# Patient Record
Sex: Female | Born: 1940
Health system: Southern US, Community
[De-identification: ages and names within clinical notes are randomized; demographics above are authoritative.]

## PROBLEM LIST (undated history)

## (undated) DIAGNOSIS — N189 Chronic kidney disease, unspecified: Secondary | ICD-10-CM

## (undated) DIAGNOSIS — I480 Paroxysmal atrial fibrillation: Secondary | ICD-10-CM

## (undated) DIAGNOSIS — I214 Non-ST elevation (NSTEMI) myocardial infarction: Secondary | ICD-10-CM

## (undated) DIAGNOSIS — T7840XA Allergy, unspecified, initial encounter: Secondary | ICD-10-CM

## (undated) DIAGNOSIS — E785 Hyperlipidemia, unspecified: Secondary | ICD-10-CM

## (undated) DIAGNOSIS — I219 Acute myocardial infarction, unspecified: Secondary | ICD-10-CM

## (undated) DIAGNOSIS — I4891 Unspecified atrial fibrillation: Secondary | ICD-10-CM

## (undated) DIAGNOSIS — M1990 Unspecified osteoarthritis, unspecified site: Secondary | ICD-10-CM

## (undated) DIAGNOSIS — K219 Gastro-esophageal reflux disease without esophagitis: Secondary | ICD-10-CM

## (undated) DIAGNOSIS — I10 Essential (primary) hypertension: Secondary | ICD-10-CM

## (undated) HISTORY — PX: YAG LASER APPLICATION: SHX6189

## (undated) HISTORY — PX: CATARACT EXTRACTION, BILATERAL: SHX1313

## (undated) HISTORY — DX: Chronic kidney disease, unspecified: N18.9

## (undated) HISTORY — PX: APPENDECTOMY: SHX54

## (undated) HISTORY — DX: Allergy, unspecified, initial encounter: T78.40XA

## (undated) HISTORY — DX: Unspecified osteoarthritis, unspecified site: M19.90

## (undated) HISTORY — DX: Unspecified atrial fibrillation: I48.91

## (undated) HISTORY — PX: EYE SURGERY: SHX253

## (undated) HISTORY — DX: Acute myocardial infarction, unspecified: I21.9

## (undated) HISTORY — DX: Hyperlipidemia, unspecified: E78.5

## (undated) HISTORY — PX: STRABISMUS SURGERY: SHX218

## (undated) HISTORY — DX: Non-ST elevation (NSTEMI) myocardial infarction: I21.4

## (undated) HISTORY — DX: Paroxysmal atrial fibrillation: I48.0

## (undated) HISTORY — DX: Gastro-esophageal reflux disease without esophagitis: K21.9

## (undated) HISTORY — PX: TONSILLECTOMY: SUR1361

## (undated) HISTORY — PX: CATARACT EXTRACTION: SUR2

---

## 1998-05-22 ENCOUNTER — Other Ambulatory Visit: Admission: RE | Admit: 1998-05-22 | Discharge: 1998-05-22 | Payer: Self-pay | Admitting: Obstetrics & Gynecology

## 1998-09-06 ENCOUNTER — Ambulatory Visit (HOSPITAL_COMMUNITY): Admission: RE | Admit: 1998-09-06 | Discharge: 1998-09-06 | Payer: Self-pay | Admitting: Obstetrics & Gynecology

## 1998-09-06 ENCOUNTER — Encounter: Payer: Self-pay | Admitting: Obstetrics & Gynecology

## 1999-06-24 ENCOUNTER — Other Ambulatory Visit: Admission: RE | Admit: 1999-06-24 | Discharge: 1999-06-24 | Payer: Self-pay | Admitting: Obstetrics & Gynecology

## 1999-10-08 ENCOUNTER — Ambulatory Visit (HOSPITAL_COMMUNITY): Admission: RE | Admit: 1999-10-08 | Discharge: 1999-10-08 | Payer: Self-pay | Admitting: Obstetrics & Gynecology

## 1999-10-08 ENCOUNTER — Encounter: Payer: Self-pay | Admitting: Obstetrics & Gynecology

## 2000-10-09 ENCOUNTER — Ambulatory Visit (HOSPITAL_COMMUNITY): Admission: RE | Admit: 2000-10-09 | Discharge: 2000-10-09 | Payer: Self-pay | Admitting: Obstetrics & Gynecology

## 2000-10-09 ENCOUNTER — Encounter: Payer: Self-pay | Admitting: Obstetrics & Gynecology

## 2000-11-05 ENCOUNTER — Other Ambulatory Visit: Admission: RE | Admit: 2000-11-05 | Discharge: 2000-11-05 | Payer: Self-pay | Admitting: Obstetrics & Gynecology

## 2000-11-27 ENCOUNTER — Other Ambulatory Visit: Admission: RE | Admit: 2000-11-27 | Discharge: 2000-11-27 | Payer: Self-pay | Admitting: Orthopedic Surgery

## 2001-01-01 ENCOUNTER — Ambulatory Visit (HOSPITAL_COMMUNITY): Admission: RE | Admit: 2001-01-01 | Discharge: 2001-01-01 | Payer: Self-pay | Admitting: *Deleted

## 2001-07-15 ENCOUNTER — Other Ambulatory Visit: Admission: RE | Admit: 2001-07-15 | Discharge: 2001-07-15 | Payer: Self-pay | Admitting: Internal Medicine

## 2001-11-01 ENCOUNTER — Ambulatory Visit (HOSPITAL_COMMUNITY): Admission: RE | Admit: 2001-11-01 | Discharge: 2001-11-01 | Payer: Self-pay | Admitting: Internal Medicine

## 2001-11-01 ENCOUNTER — Encounter: Payer: Self-pay | Admitting: Internal Medicine

## 2002-11-02 ENCOUNTER — Ambulatory Visit (HOSPITAL_COMMUNITY): Admission: RE | Admit: 2002-11-02 | Discharge: 2002-11-02 | Payer: Self-pay | Admitting: Internal Medicine

## 2002-11-02 ENCOUNTER — Encounter: Payer: Self-pay | Admitting: Internal Medicine

## 2003-11-07 ENCOUNTER — Ambulatory Visit (HOSPITAL_COMMUNITY): Admission: RE | Admit: 2003-11-07 | Discharge: 2003-11-07 | Payer: Self-pay | Admitting: Internal Medicine

## 2004-02-27 ENCOUNTER — Other Ambulatory Visit: Admission: RE | Admit: 2004-02-27 | Discharge: 2004-02-27 | Payer: Self-pay | Admitting: Internal Medicine

## 2004-11-07 ENCOUNTER — Ambulatory Visit (HOSPITAL_COMMUNITY): Admission: RE | Admit: 2004-11-07 | Discharge: 2004-11-07 | Payer: Self-pay | Admitting: Internal Medicine

## 2005-11-17 ENCOUNTER — Ambulatory Visit (HOSPITAL_COMMUNITY): Admission: RE | Admit: 2005-11-17 | Discharge: 2005-11-17 | Payer: Self-pay | Admitting: Internal Medicine

## 2005-12-04 ENCOUNTER — Encounter: Admission: RE | Admit: 2005-12-04 | Discharge: 2005-12-04 | Payer: Self-pay | Admitting: Internal Medicine

## 2006-12-01 ENCOUNTER — Encounter: Admission: RE | Admit: 2006-12-01 | Discharge: 2006-12-01 | Payer: Self-pay | Admitting: Internal Medicine

## 2007-03-16 ENCOUNTER — Other Ambulatory Visit: Admission: RE | Admit: 2007-03-16 | Discharge: 2007-03-16 | Payer: Self-pay | Admitting: Internal Medicine

## 2007-07-06 IMAGING — MG MM DIGITAL SCREENING BILAT
4 series · 4 of 4 positions shown · non-contrast
Comparison: none

Bilateral CC and MLO view(s) were taken.

SCREENING MAMMOGRAM:
Comparison is made to previous study dated 11-07-03.  There are scattered fibroglandular densities. 
A possible mass is noted in the left breast.  Spot compression views and possibly sonography are 
recommended for further evaluation.  There are no masses or malignant type calcifications in the 
right breast.

[R CC]
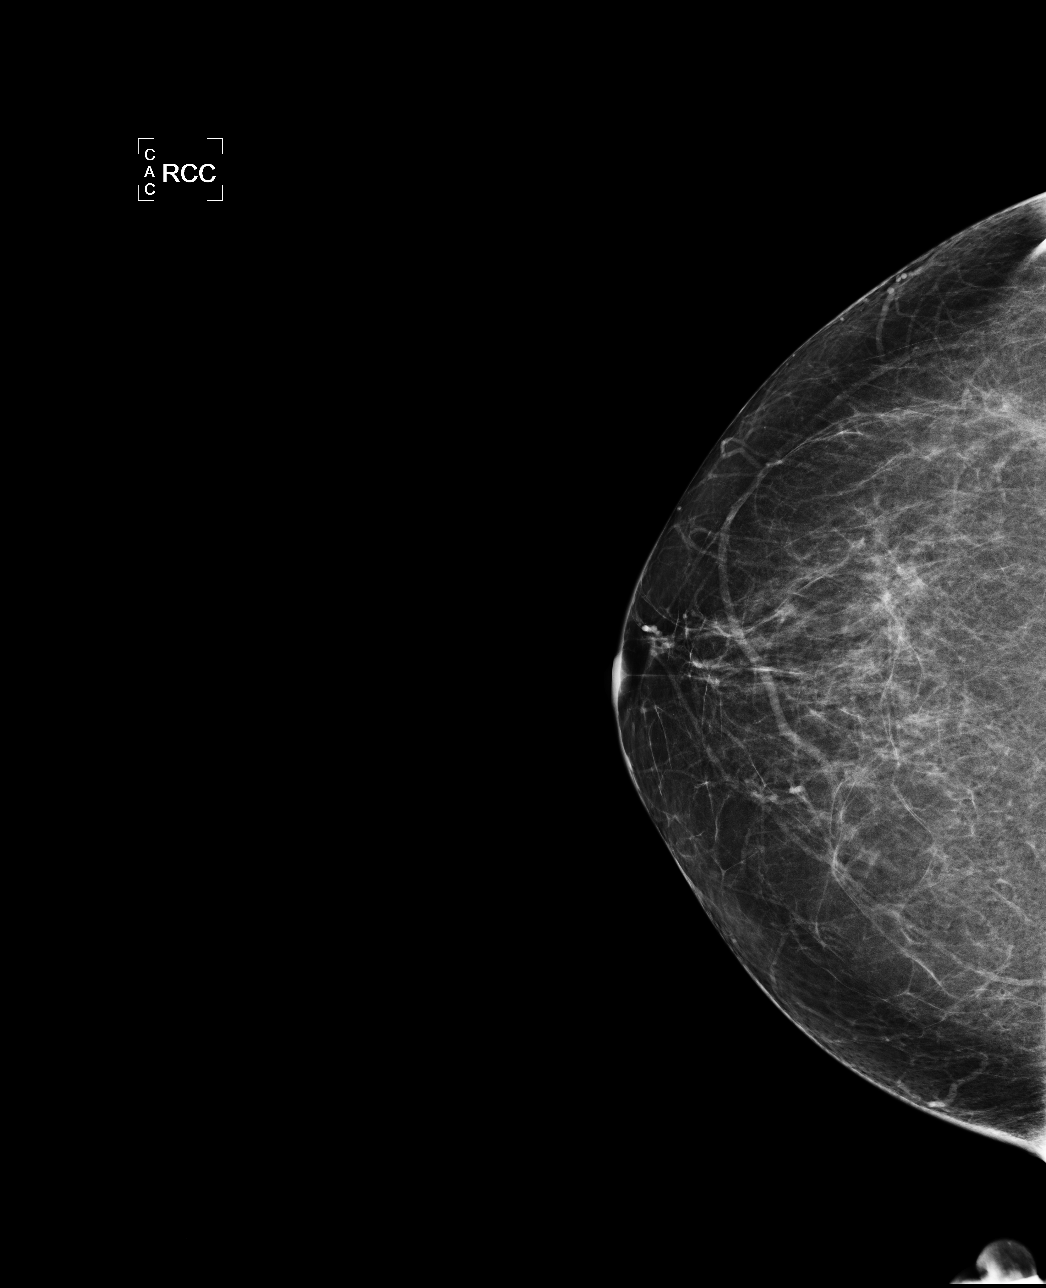

[R MLO]
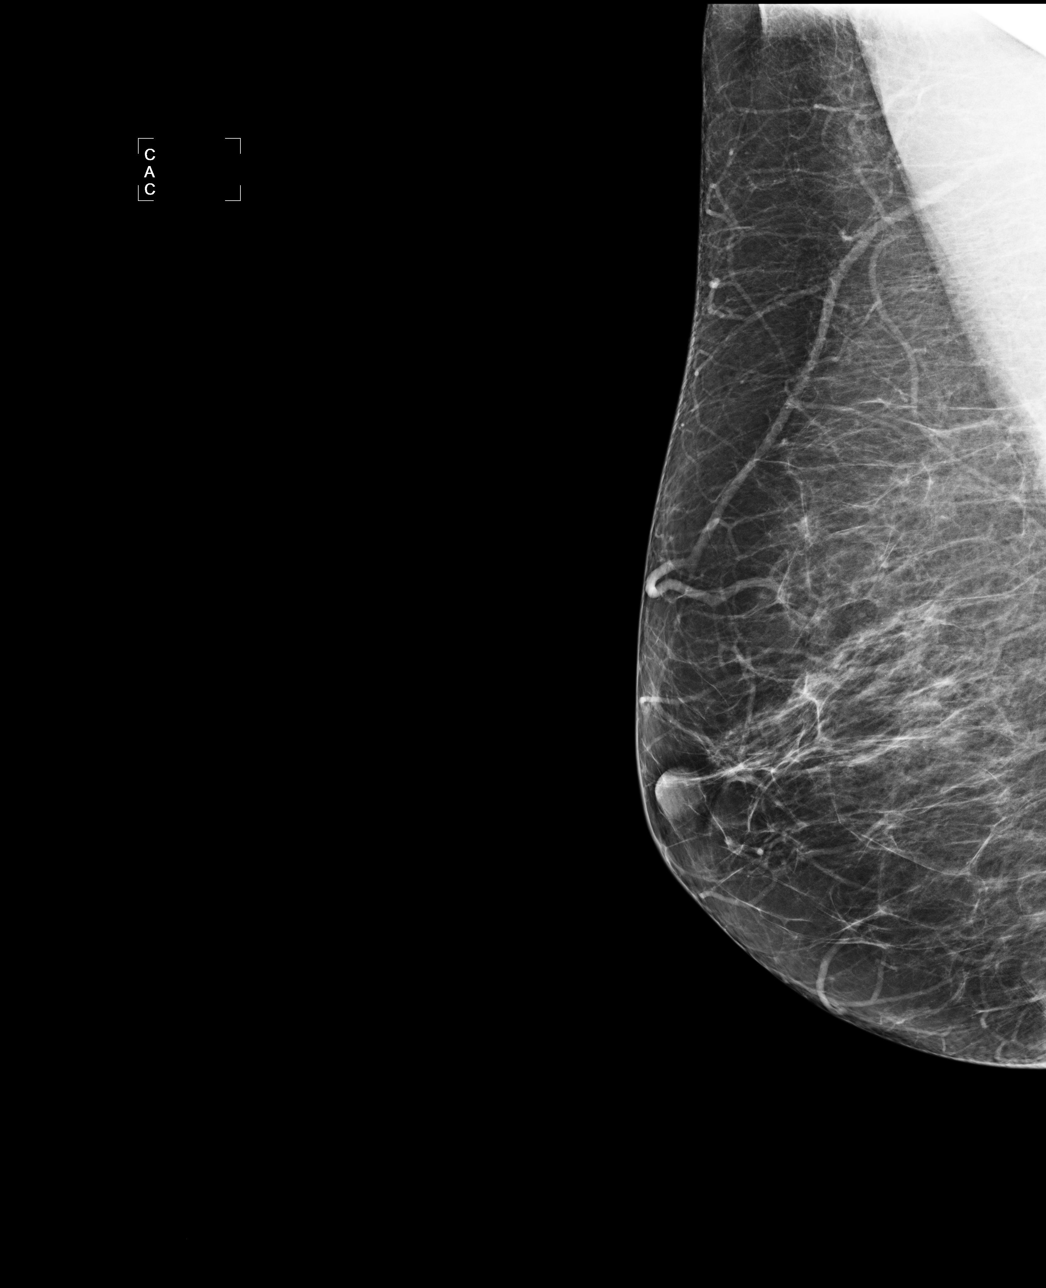

[L CC]
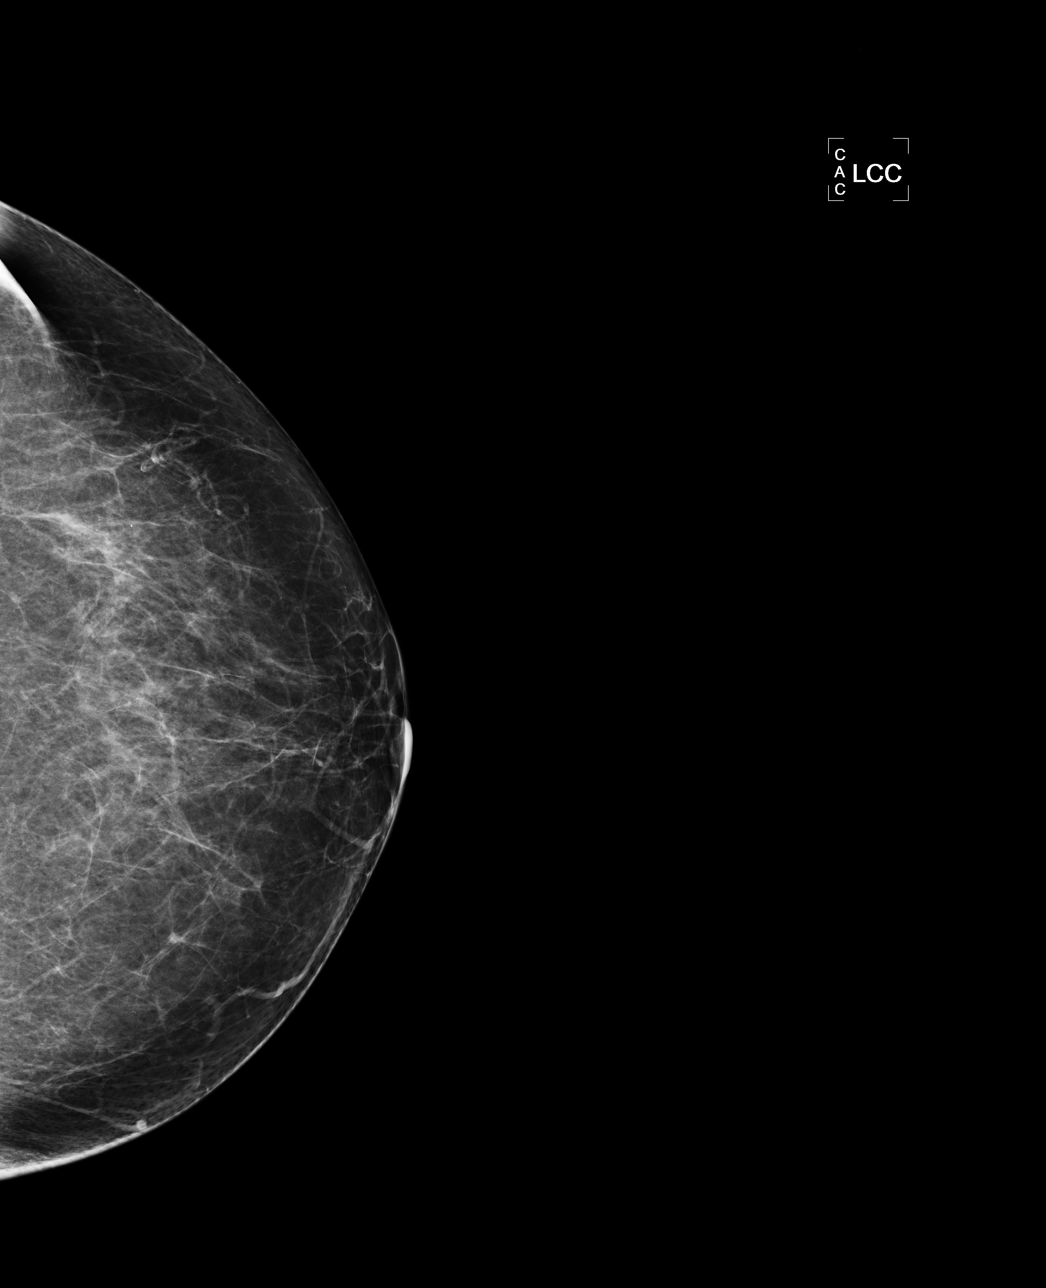

[L MLO]
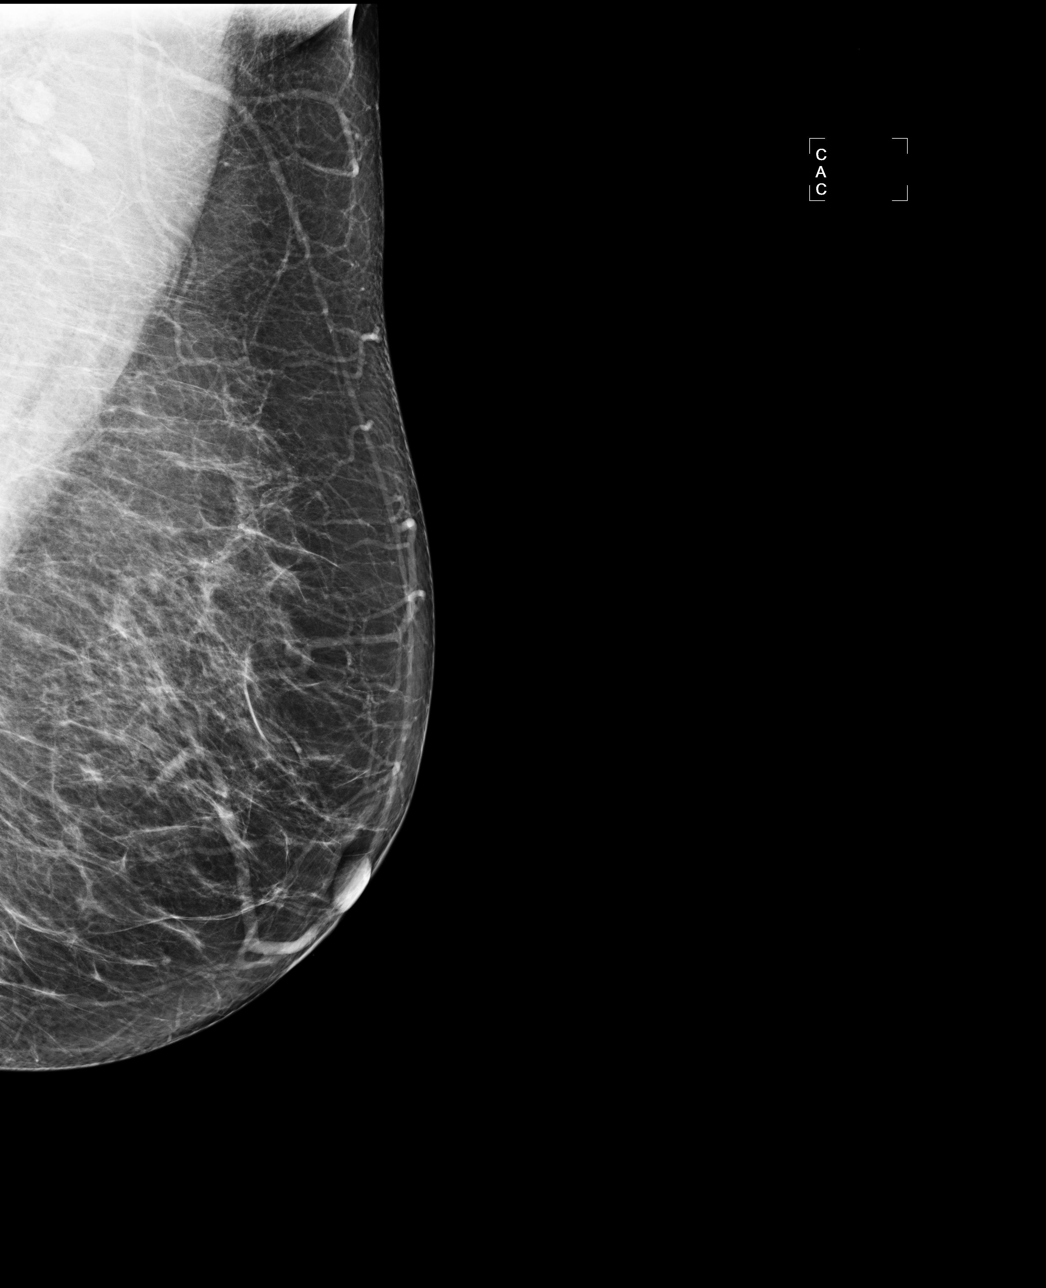

[4 of 4 positions shown; findings below may reference images not displayed]

IMPRESSION: Possible mass, left breast.  Additional evaluation is indicated.  The patient will be contacted for
additional studies and a supplemental report will follow.  No mammographic evidence of malignancy,
right breast.

ASSESSMENT: Need additional imaging evaluation and/or prior mammograms for comparison - BI-RADS 0

Further imaging of the left breast.

## 2007-12-14 ENCOUNTER — Encounter: Admission: RE | Admit: 2007-12-14 | Discharge: 2007-12-14 | Payer: Self-pay | Admitting: Internal Medicine

## 2008-07-03 ENCOUNTER — Ambulatory Visit (HOSPITAL_COMMUNITY): Admission: RE | Admit: 2008-07-03 | Discharge: 2008-07-03 | Payer: Self-pay | Admitting: *Deleted

## 2008-12-14 ENCOUNTER — Encounter: Admission: RE | Admit: 2008-12-14 | Discharge: 2008-12-14 | Payer: Self-pay | Admitting: Internal Medicine

## 2009-06-12 ENCOUNTER — Other Ambulatory Visit: Admission: RE | Admit: 2009-06-12 | Discharge: 2009-06-12 | Payer: Self-pay | Admitting: Internal Medicine

## 2009-07-18 ENCOUNTER — Encounter: Admission: RE | Admit: 2009-07-18 | Discharge: 2009-09-12 | Payer: Self-pay | Admitting: Orthopedic Surgery

## 2009-12-27 ENCOUNTER — Encounter: Admission: RE | Admit: 2009-12-27 | Discharge: 2009-12-27 | Payer: Self-pay | Admitting: Internal Medicine

## 2010-10-06 ENCOUNTER — Encounter: Payer: Self-pay | Admitting: Internal Medicine

## 2010-11-19 ENCOUNTER — Other Ambulatory Visit: Payer: Self-pay | Admitting: Internal Medicine

## 2010-11-19 DIAGNOSIS — Z1231 Encounter for screening mammogram for malignant neoplasm of breast: Secondary | ICD-10-CM

## 2010-12-30 ENCOUNTER — Ambulatory Visit
Admission: RE | Admit: 2010-12-30 | Discharge: 2010-12-30 | Disposition: A | Discharge status: Home / Self Care | Payer: Medicare Other | Source: Ambulatory Visit | Attending: Internal Medicine | Admitting: Internal Medicine

## 2010-12-30 DIAGNOSIS — Z1231 Encounter for screening mammogram for malignant neoplasm of breast: Secondary | ICD-10-CM

## 2011-01-28 NOTE — Op Note (Signed)
NAME:  Kelli [name], Kelli [name]                     ACCOUNT NO.:  `   MEDICAL RECORD NO.:  [id number]          PATIENT TYPE:  AMB   LOCATION:  ENDO                         FACILITY:  [facility]   PHYSICIAN:  Georgiana Spinner, M.D.    DATE OF BIRTH:  04/12/1941   DATE OF PROCEDURE:  07/03/2008  DATE OF DISCHARGE:                               OPERATIVE REPORT   PROCEDURE:  Colonoscopy.   INDICATIONS:  Colon cancer screening.   ANESTHESIA:  Fentanyl 100 mcg, Versed 10 mg.   PROCEDURE:  With the patient mildly sedated in the left lateral  decubitus position, the Pentax videoscopic pediatric colonoscope was  inserted in the rectum and passed under direct vision to the cecum,  identified by base of cecum and ileocecal valve, both of which were  photographed.  From this point the colonoscope was slowly withdrawn  taking circumferential views of colonic mucosa stopping only in the  rectum which appeared normal on direct and showed hemorrhoids on  retroflexed view.  The endoscope was straightened and withdrawn.  The  patient's vital signs, pulse oximeter remained stable.  The patient  tolerated procedure well without apparent complications.   FINDINGS:  Internal hemorrhoids, otherwise an unremarkable examination.   PLAN:  Repeat examination in 5-10 years.           ______________________________  Georgiana Spinner, M.D.     GMO/MEDQ  D:  07/03/2008  T:  07/03/2008  Job:  [id number]

## 2011-01-31 NOTE — Procedures (Signed)
[facility]  Patient:    Kelli [name], Kelli [name]                          MRN: [id number] Adm. Date:  [id number] Attending:  Sabino Gasser                           Procedure Report  PROCEDURE:  Colonoscopy.  INDICATIONS:  Rectal bleeding.  ANESTHESIA:  Demerol 50 mg, Versed 5 mg.  DESCRIPTION OF PROCEDURE:  With patient mildly sedated in the left lateral decubitus position, the Olympus videoscopic colonoscope was inserted into the rectum, passed under direct vision to the cecum, identified by ileocecal valve and appendiceal orifice, both of which were photographed.  We entered into the terminal ileum, which appeared normal and was photographed.  From this point, the colonoscope was then slowly withdrawn, taking circumferential views of the entire colonic mucosa, stopping in the sigmoid colon, where there was a rare diverticulum, photographed.  The endoscope was pulled back to the rectum, which appeared normal on direct view and showed internal hemorrhoids on retroflex view.  The endoscope was straightened and withdrawn.  The patients vital signs and pulse oximetry remained stable.  The patient tolerated the procedure well without apparent complications.  FINDINGS:  Internal hemorrhoids, rare diverticulum in the sigmoid colon, otherwise unremarkable colonoscopic examination, including the terminal ileum.  PLAN:  Follow up with me in five years or as needed. DD:  01/01/01 TD:  01/02/01 Job: [id number]

## 2011-11-20 ENCOUNTER — Other Ambulatory Visit: Payer: Self-pay | Admitting: Internal Medicine

## 2011-11-20 DIAGNOSIS — Z1231 Encounter for screening mammogram for malignant neoplasm of breast: Secondary | ICD-10-CM

## 2012-01-01 ENCOUNTER — Ambulatory Visit
Admission: RE | Admit: 2012-01-01 | Discharge: 2012-01-01 | Disposition: A | Discharge status: Home / Self Care | Payer: Medicare Other | Source: Ambulatory Visit | Attending: Internal Medicine | Admitting: Internal Medicine

## 2012-01-01 DIAGNOSIS — Z1231 Encounter for screening mammogram for malignant neoplasm of breast: Secondary | ICD-10-CM

## 2012-08-20 ENCOUNTER — Other Ambulatory Visit: Payer: Self-pay

## 2012-11-29 ENCOUNTER — Other Ambulatory Visit: Payer: Self-pay

## 2012-11-29 DIAGNOSIS — Z1231 Encounter for screening mammogram for malignant neoplasm of breast: Secondary | ICD-10-CM

## 2013-01-05 ENCOUNTER — Ambulatory Visit: Payer: Medicare Other

## 2013-01-07 ENCOUNTER — Ambulatory Visit
Admission: RE | Admit: 2013-01-07 | Discharge: 2013-01-07 | Disposition: A | Discharge status: Home / Self Care | Payer: Medicare Other | Source: Ambulatory Visit

## 2013-01-07 DIAGNOSIS — Z1231 Encounter for screening mammogram for malignant neoplasm of breast: Secondary | ICD-10-CM

## 2013-02-13 DIAGNOSIS — I4891 Unspecified atrial fibrillation: Secondary | ICD-10-CM

## 2013-02-13 DIAGNOSIS — I214 Non-ST elevation (NSTEMI) myocardial infarction: Secondary | ICD-10-CM

## 2013-02-13 HISTORY — DX: Unspecified atrial fibrillation: I48.91

## 2013-02-13 HISTORY — DX: Non-ST elevation (NSTEMI) myocardial infarction: I21.4

## 2013-03-09 ENCOUNTER — Observation Stay (HOSPITAL_COMMUNITY)
Admission: EM | Admit: 2013-03-09 | Discharge: 2013-03-11 | Disposition: A | Discharge status: Home / Self Care | Payer: Medicare Other | Attending: Cardiology | Admitting: Cardiology

## 2013-03-09 ENCOUNTER — Emergency Department (HOSPITAL_COMMUNITY): Payer: Medicare Other

## 2013-03-09 ENCOUNTER — Encounter (HOSPITAL_COMMUNITY): Payer: Self-pay | Admitting: Emergency Medicine

## 2013-03-09 DIAGNOSIS — R0789 Other chest pain: Secondary | ICD-10-CM | POA: Insufficient documentation

## 2013-03-09 DIAGNOSIS — I214 Non-ST elevation (NSTEMI) myocardial infarction: Principal | ICD-10-CM | POA: Diagnosis present

## 2013-03-09 DIAGNOSIS — R0602 Shortness of breath: Secondary | ICD-10-CM | POA: Insufficient documentation

## 2013-03-09 DIAGNOSIS — R748 Abnormal levels of other serum enzymes: Secondary | ICD-10-CM | POA: Insufficient documentation

## 2013-03-09 DIAGNOSIS — Z7901 Long term (current) use of anticoagulants: Secondary | ICD-10-CM | POA: Insufficient documentation

## 2013-03-09 DIAGNOSIS — R7989 Other specified abnormal findings of blood chemistry: Secondary | ICD-10-CM | POA: Diagnosis present

## 2013-03-09 DIAGNOSIS — Z79899 Other long term (current) drug therapy: Secondary | ICD-10-CM | POA: Insufficient documentation

## 2013-03-09 DIAGNOSIS — I10 Essential (primary) hypertension: Secondary | ICD-10-CM | POA: Insufficient documentation

## 2013-03-09 DIAGNOSIS — I4891 Unspecified atrial fibrillation: Secondary | ICD-10-CM | POA: Insufficient documentation

## 2013-03-09 DIAGNOSIS — I480 Paroxysmal atrial fibrillation: Secondary | ICD-10-CM | POA: Diagnosis present

## 2013-03-09 HISTORY — DX: Essential (primary) hypertension: I10

## 2013-03-09 LAB — CBC WITH DIFFERENTIAL/PLATELET
Basophils Relative: 0 % (ref 0–1)
Eosinophils Absolute: 0.1 10*3/uL (ref 0.0–0.7)
Hemoglobin: 13.3 g/dL (ref 12.0–15.0)
MCHC: 34.6 g/dL (ref 30.0–36.0)
MCV: 87.3 fL (ref 78.0–100.0)
Monocytes Absolute: 0.9 10*3/uL (ref 0.1–1.0)
Platelets: 251 10*3/uL (ref 150–400)
RBC: 4.4 MIL/uL (ref 3.87–5.11)
RDW: 13.7 % (ref 11.5–15.5)
WBC: 10.4 10*3/uL (ref 4.0–10.5)

## 2013-03-09 LAB — BASIC METABOLIC PANEL
BUN: 16 mg/dL (ref 6–23)
CO2: 28 mEq/L (ref 19–32)
Calcium: 8.8 mg/dL (ref 8.4–10.5)
GFR calc Af Amer: 67 mL/min — ABNORMAL LOW (ref >90)
GFR calc non Af Amer: 58 mL/min — ABNORMAL LOW (ref >90)

## 2013-03-09 LAB — POCT I-STAT TROPONIN I

## 2013-03-09 LAB — PROTIME-INR
INR: 0.98 (ref 0.00–1.49)
Prothrombin Time: 12.8 seconds (ref 11.6–15.2)

## 2013-03-09 MED ORDER — NITROGLYCERIN 0.4 MG SL SUBL: 0.4000 mg | SUBLINGUAL_TABLET | SUBLINGUAL | Status: DC | PRN

## 2013-03-09 MED ORDER — OMEGA-3-ACID ETHYL ESTERS 1 G PO CAPS
1.0000 g | ORAL_CAPSULE | Freq: Every day | ORAL | Status: DC
  Administered 2013-03-10 – 2013-03-11 (×2): 1 g via ORAL
  Filled 2013-03-09 (×2): qty 1

## 2013-03-09 MED ORDER — CALCIUM + D3 600-200 MG-UNIT PO TABS: 1.0000 | ORAL_TABLET | Freq: Every day | ORAL | Status: DC

## 2013-03-09 MED ORDER — VITAMIN D 400 UNITS PO CAPS: 400.0000 [IU] | ORAL_CAPSULE | Freq: Every day | ORAL | Status: DC

## 2013-03-09 MED ORDER — METOPROLOL TARTRATE 25 MG PO TABS
25.0000 mg | ORAL_TABLET | Freq: Two times a day (BID) | ORAL | Status: DC
  Administered 2013-03-09: 25 mg via ORAL
  Filled 2013-03-09 (×3): qty 1

## 2013-03-09 MED ORDER — CALCIUM CARBONATE-VITAMIN D 500-200 MG-UNIT PO TABS
1.0000 | ORAL_TABLET | Freq: Every day | ORAL | Status: DC
  Administered 2013-03-10 – 2013-03-11 (×2): 1 via ORAL
  Filled 2013-03-09 (×3): qty 1

## 2013-03-09 MED ORDER — SODIUM CHLORIDE 0.9 % IJ SOLN: 3.0000 mL | Freq: Two times a day (BID) | INTRAMUSCULAR | Status: DC

## 2013-03-09 MED ORDER — ASPIRIN 325 MG PO TABS
325.0000 mg | ORAL_TABLET | Freq: Every day | ORAL | Status: DC
  Administered 2013-03-10 – 2013-03-11 (×2): 325 mg via ORAL
  Filled 2013-03-09 (×2): qty 1

## 2013-03-09 MED ORDER — DOCUSATE SODIUM 100 MG PO CAPS
100.0000 mg | ORAL_CAPSULE | Freq: Every day | ORAL | Status: DC
  Administered 2013-03-09 – 2013-03-10 (×2): 100 mg via ORAL
  Filled 2013-03-09 (×3): qty 1

## 2013-03-09 MED ORDER — VITAMIN C 500 MG PO TABS
500.0000 mg | ORAL_TABLET | Freq: Every day | ORAL | Status: DC
  Administered 2013-03-10 – 2013-03-11 (×2): 500 mg via ORAL
  Filled 2013-03-09 (×2): qty 1

## 2013-03-09 MED ORDER — SODIUM CHLORIDE 0.9 % IJ SOLN: 3.0000 mL | INTRAMUSCULAR | Status: DC | PRN

## 2013-03-09 MED ORDER — SODIUM CHLORIDE 0.9 % IV SOLN: 250.0000 mL | INTRAVENOUS | Status: DC | PRN

## 2013-03-09 MED ORDER — ONDANSETRON HCL 4 MG/2ML IJ SOLN: 4.0000 mg | Freq: Four times a day (QID) | INTRAMUSCULAR | Status: DC | PRN

## 2013-03-09 MED ORDER — HEPARIN SODIUM (PORCINE) 5000 UNIT/ML IJ SOLN
5000.0000 [IU] | Freq: Three times a day (TID) | INTRAMUSCULAR | Status: DC
  Administered 2013-03-09 – 2013-03-10 (×4): 5000 [IU] via SUBCUTANEOUS
  Filled 2013-03-09 (×8): qty 1

## 2013-03-09 MED ORDER — CHOLECALCIFEROL 10 MCG (400 UNIT) PO TABS
400.0000 [IU] | ORAL_TABLET | Freq: Every day | ORAL | Status: DC
  Administered 2013-03-10 – 2013-03-11 (×2): 400 [IU] via ORAL
  Filled 2013-03-09 (×2): qty 1

## 2013-03-09 MED ORDER — ASPIRIN 81 MG PO CHEW
324.0000 mg | CHEWABLE_TABLET | ORAL | Status: AC
  Administered 2013-03-09: 324 mg via ORAL
  Filled 2013-03-09: qty 4

## 2013-03-09 MED ORDER — ACETAMINOPHEN 325 MG PO TABS: 650.0000 mg | ORAL_TABLET | ORAL | Status: DC | PRN

## 2013-03-09 MED ORDER — FLUTICASONE PROPIONATE 50 MCG/ACT NA SUSP: 2.0000 | Freq: Every day | NASAL | Status: DC | PRN

## 2013-03-09 MED ORDER — OMEGA-3 FATTY ACIDS 1000 MG PO CAPS: 1.0000 g | ORAL_CAPSULE | Freq: Every day | ORAL | Status: DC

## 2013-03-09 NOTE — ED Provider Notes (Signed)
History    CSN: [id number] Arrival date & time 03/09/13  1400  First MD Initiated Contact with Patient 03/09/13 1429     Chief Complaint  Patient presents with  . Palpitations   (Consider location/radiation/quality/duration/timing/severity/associated sxs/prior Treatment) The history is provided by the patient.    Patient is to the ED or palpitations. Patient states she was shopping at [facility] when she had a sudden onset and sensation that her heart was racing, she became jittery, slightly lightheaded, and mildly short of breath. Symptoms resolved but again returned.  She was evaluated. at a local fire station and told that she was in A. Fib. Heart rate was initially in the 200s. Patient was given 20 mg of IV Cardizem prior to arrival with good regulation of HR.  Patient has no history of A. Fib. No personal or family hx of CAD or MI.  Patient is currently asymptomatic- No chest pain, shortness of breath, dizziness, weakness, palpitations, or nausea.  Pt is active on a daily basis and works at a [employer].  No daily medications.   Past Medical History  Diagnosis Date  . Hypertension    Past Surgical History  Procedure Laterality Date  . Eye surgery    . Cataract extraction, bilateral    . Appendectomy    . Tonsillectomy     History reviewed. No pertinent family history. History  Substance Use Topics  . Smoking status: Never Smoker   . Smokeless tobacco: Never Used  . Alcohol Use: No   OB History   Grav Para Term Preterm Abortions TAB SAB Ect Mult Living                 Review of Systems  Cardiovascular: Positive for palpitations.  All other systems reviewed and are negative.    Allergies  Augmentin  Home Medications  No current outpatient prescriptions on file. BP 132/62  Pulse 77  Temp(Src) 98.1 F (36.7 C) (Oral)  Resp 14  SpO2 100%  Physical Exam  Nursing note and vitals reviewed. Constitutional: She is oriented to person, place, and time. She  appears well-developed and well-nourished.  HENT:  Head: Normocephalic and atraumatic.  Mouth/Throat: Oropharynx is clear and moist.  Eyes: Conjunctivae and EOM are normal. Pupils are equal, round, and reactive to light.  Neck: Normal range of motion.  Cardiovascular: Normal rate, regular rhythm and normal heart sounds.   Pulmonary/Chest: Effort normal and breath sounds normal. No respiratory distress. She has no wheezes.  Abdominal: Soft. Bowel sounds are normal. There is no tenderness. There is no guarding.  Musculoskeletal: Normal range of motion. She exhibits no edema.  Neurological: She is alert and oriented to person, place, and time. She has normal strength. No cranial nerve deficit or sensory deficit.  CN grossly intact, moves all extremities appropriately, no acute neuro deficits or facial droop appreciated  Skin: Skin is warm and dry.  Psychiatric: She has a normal mood and affect. Her speech is normal.    ED Course  Procedures (including critical care time)   Date: 03/09/2013  Rate: 95  Rhythm: normal sinus rhythm  QRS Axis: normal  Intervals: normal  ST/T Wave abnormalities: normal  Conduction Disutrbances:none  Narrative Interpretation: NSR, no acute ischemic changes  Old EKG Reviewed: none available   Labs Reviewed  BASIC METABOLIC PANEL - Abnormal; Notable for the following:    Glucose, Bld 104 (*)    GFR calc non Af Amer 58 (*)    GFR calc Af  Amer 61 (*)    All other components within normal limits  TROPONIN I - Abnormal; Notable for the following:    Troponin I 1.47 (*)    All other components within normal limits  TROPONIN I - Abnormal; Notable for the following:    Troponin I 1.08 (*)    All other components within normal limits  BASIC METABOLIC PANEL - Abnormal; Notable for the following:    GFR calc non Af Amer 61 (*)    GFR calc Af Amer 70 (*)    All other components within normal limits  LIPID PANEL - Abnormal; Notable for the following:     Cholesterol 201 (*)    LDL Cholesterol 130 (*)    All other components within normal limits  POCT I-STAT TROPONIN I - Abnormal; Notable for the following:    Troponin i, poc 0.29 (*)    All other components within normal limits  CBC WITH DIFFERENTIAL  PROTIME-INR  CBC  TSH  T4, FREE  HEMOGLOBIN A1C  TROPONIN I   Dg Chest 2 View  03/09/2013   *RADIOLOGY REPORT*  Clinical Data: Palpitations.  CHEST - 2 VIEW  Comparison: None.  Findings: Trachea is midline.  Heart size normal.  Biapical pleural parenchymal scarring.  Lungs are hyperinflated but otherwise clear. No pleural fluid.  IMPRESSION: Hyperinflation without acute finding.   Original Report Authenticated By: Leanna Battles, M.D.   1. Atrial fibrillation with RVR   2. Elevated troponin     MDM   EKG NSR, no acute ischemic changes.  Positive trop at 0.29.  CXR and other labs pending at this time.  Signed out to Dr. Ethelda Chick to consult cardiology and dispo when appropriate.  Garlon Hatchet, PA-C 03/10/13 [phone]

## 2013-03-09 NOTE — ED Provider Notes (Signed)
Patient developed palpitations accompanied by mild chest tightness and diaphoresis onset approximate 2 hours prior arrival symptoms lasted approximately 2 hours prior by EMS EMS treated patient with Cardizem 20 mg IV with resolution of symptoms he is presently asymptomatic patient's heart rate was reportedly to212 in the field.  Doug Sou, MD 03/09/13 1623

## 2013-03-09 NOTE — ED Notes (Signed)
Attempted to call report at [facility]

## 2013-03-09 NOTE — ED Notes (Signed)
Pt from home c/o palpitations. Pt was shopping felt heart racing,became jittery,slightly lightheaded and sob. EMS initial HR 200s, Pt received 20 mg total cardizem pta. Pt hr 140-110s post cardiezm. Pt denies cp, sob. No other complaints

## 2013-03-09 NOTE — Progress Notes (Signed)
CRITICAL VALUE ALERT  Critical value received:  Troponin 1.47  Date of notification:  03/09/13  Time of notification:  2152  Critical value read back:yes  Nurse who received alert:  Jodene Nam RN  MD notified (1st page):  Dr. Ronney Asters  Time of first page:  2150  Responding MD:  Dr. Ronney Asters  Time MD responded:  2155  No new orders, will continue to monitor.

## 2013-03-09 NOTE — H&P (Signed)
Physician History and Physical    Patient ID: Kelli [name] MRN: [id number] DOB/AGE: December 08, 1940 72 y.o. Admit date: 03/09/2013  Primary Care Physician: Kelli [name] Primary Cardiologist N/A  HPI: Kelli [name] is seen for evaluation of new atrial fibrillation. She is a pleasant 72 year old white female, previously healthy, who presented to the emergency department today after experiencing heart flutters. She was just doing grocery shopping when she felt her heart fluttering and felt jittery. She had mild chest heaviness and shortness of breath. She went to EMS station and was found to be in atrial fibrillation. ECG from that encounter demonstrates atrial fibrillation with a rate of 126 beats per minute. There was minimal nonspecific ST abnormality. QTC was 441 ms. She received 2 doses of IV Cardizem and by the time she arrived in the ED she was back in sinus rhythm. She states she has had rare minor palpitations that last a few seconds. This episode lasted 2 hours. The only other complaint she has had is that she has been more tired than usual. 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 She states that one time her blood pressure was elevated she was taking medication but was able to be weaned off her medication and her blood pressure has remained normal. She has no history of diabetes. Her cholesterol level has been borderline. She has no prior history of any cardiac disease.   Review of systems complete and found to be negative unless listed above. She does have occasional arthralgias which she uses over-the-counter ibuprofen. She typically drinks one or 2 cups of coffee per day but today she had 3 caffeinated beverages. No history of TIA or stroke. No bleeding history. She denies alcohol use. Past Medical History  Diagnosis Date  . Hypertension     History reviewed. No pertinent family history.  History   Social History  . Marital Status: Married    Spouse Name: N/A    Number of  Children: 3  . Years of Education: N/A   Occupational History  . 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 Smoking status: Never Smoker   . Smokeless tobacco: Never Used  . Alcohol Use: No  . Drug Use: No  . Sexually Active: Not on file   Other Topics Concern  . Not on file   Social History Narrative  . No narrative on file    Past Surgical History  Procedure Laterality Date  . Eye surgery    . Cataract extraction, bilateral    . Appendectomy    . Tonsillectomy      Family history is positive for her mother who died of a myocardial infarction age 25. She had her first heart attack in her 72s. Father died at age 4 with emphysema and congestive heart failure. One sister died with leukemia. One brother is alive and well.  Medications: no prescription medications. She takes supplements of Vit D, C, fish oil, and calcium. Takes a stool softener daily. Physical Exam: Blood pressure 128/68, pulse 83, temperature 98.2 F (36.8 C), temperature source Oral, resp. rate 22, SpO2 98.00%.  She is a pleasant white female in no acute distress. HEENT: Normal cephalic, atraumatic. Pupils equal round and reactive. Sclera are clear. Oropharynx is clear with good dentition. Neck is supple no JVD, adenopathy, thyromegaly, or bruits. Lungs: Clear Cardiovascular: Regular rate and rhythm. Normal S1 and S2. No gallop, murmur, or click. Abdomen: Soft and nontender. Bowel sounds are positive. No hepatosplenomegaly  or masses. No bruits. Extremities: No cyanosis or edema. Pulses are 2+ and symmetric. Skin: Warm and dry Neuro: Alert and oriented x3. Cranial nerves II through XII are intact. No focal motor or sensory deficits.  Labs:   Lab Results  Component Value Date   WBC 10.4 03/09/2013   HGB 13.3 03/09/2013   HCT 38.4 03/09/2013   MCV 87.3 03/09/2013   PLT 251 03/09/2013     Recent Labs Lab 03/09/13 1535  NA 139  K 4.1  CL 100  CO2 28  BUN 16  CREATININE 0.96    CALCIUM 8.8  GLUCOSE 104*   POC troponin .29   Radiology:CHEST - 2 VIEW  Comparison: None.  Findings: Trachea is midline. Heart size normal. Biapical pleural  parenchymal scarring. Lungs are hyperinflated but otherwise clear.  No pleural fluid.  IMPRESSION:  Hyperinflation without acute finding.  Original Report Authenticated By: Kelli [name], M.D  EKG: NSR, prolonged QTc 483 msec.  ASSESSMENT AND PLAN:  1. Atrial fibrillation with RVR- new onset. Converted now to NSR. No prior cardiac history. With elevated troponin in ED will admit for overnight observation. Start metoprolol and ASA. [place] score is 0. CHAD/VASC score is 2 based on age and sex. Annual risk of CVA 1-3%. Suggest starting with ASA pending other work up. Will check labs including serial cardiac enzymes and TFTs. Check Echo. Recommend avoidance of caffeine.  2. Elevated troponin. Most likely demand ischemia due to Afib with RVR. Will cycle. May need ischemic work up at some point but may be able to do as outpatient depending on further results.  SignedTheron Arista [facility] 03/09/2013, 6:02 PM

## 2013-03-09 NOTE — ED Provider Notes (Signed)
Date: 03/09/2013  Rate: 95  Rhythm: normal sinus rhythm  QRS Axis: normal  Intervals: normal  ST/T Wave abnormalities: normal  Conduction Disutrbances: none  Narrative Interpretation: unremarkable   Results for orders placed during the hospital encounter of 03/09/13  CBC WITH DIFFERENTIAL      Result Value Range   WBC 10.4  4.0 - 10.5 K/uL   RBC 4.40  3.87 - 5.11 MIL/uL   Hemoglobin 13.3  12.0 - 15.0 g/dL   HCT 46.9  62.9 - 52.8 %   MCV 87.3  78.0 - 100.0 fL   MCH 30.2  26.0 - 34.0 pg   MCHC 34.6  30.0 - 36.0 g/dL   RDW 41.3  24.4 - 01.0 %   Platelets 251  150 - 400 K/uL   Neutrophils Relative % 65  43 - 77 %   Neutro Abs 6.7  1.7 - 7.7 K/uL   Lymphocytes Relative 25  12 - 46 %   Lymphs Abs 2.6  0.7 - 4.0 K/uL   Monocytes Relative 9  3 - 12 %   Monocytes Absolute 0.9  0.1 - 1.0 K/uL   Eosinophils Relative 1  0 - 5 %   Eosinophils Absolute 0.1  0.0 - 0.7 K/uL   Basophils Relative 0  0 - 1 %   Basophils Absolute 0.0  0.0 - 0.1 K/uL  POCT I-STAT TROPONIN I      Result Value Range   Troponin i, poc 0.29 (*) 0.00 - 0.08 ng/mL   Comment NOTIFIED PHYSICIAN     Comment 3            Dg Chest 2 View  03/09/2013   *RADIOLOGY REPORT*  Clinical Data: Palpitations.  CHEST - 2 VIEW  Comparison: None.  Findings: Trachea is midline.  Heart size normal.  Biapical pleural parenchymal scarring.  Lungs are hyperinflated but otherwise clear. No pleural fluid.  IMPRESSION: Hyperinflation without acute finding.   Original Report Authenticated By: Leanna Battles, M.D.   [facility] cardiology to see pt i the Wyvonne Lenz, MD 03/09/13 1640

## 2013-03-10 DIAGNOSIS — I214 Non-ST elevation (NSTEMI) myocardial infarction: Secondary | ICD-10-CM | POA: Diagnosis present

## 2013-03-10 DIAGNOSIS — I059 Rheumatic mitral valve disease, unspecified: Secondary | ICD-10-CM

## 2013-03-10 LAB — TSH: TSH: 1.168 u[IU]/mL (ref 0.350–4.500)

## 2013-03-10 LAB — LIPID PANEL
HDL: 57 mg/dL (ref >39)
LDL Cholesterol: 130 mg/dL — ABNORMAL HIGH (ref 0–99)
Total CHOL/HDL Ratio: 3.5 RATIO
VLDL: 14 mg/dL (ref 0–40)

## 2013-03-10 LAB — BASIC METABOLIC PANEL
CO2: 29 mEq/L (ref 19–32)
Chloride: 105 mEq/L (ref 96–112)
Glucose, Bld: 92 mg/dL (ref 70–99)
Potassium: 4.6 mEq/L (ref 3.5–5.1)
Sodium: 141 mEq/L (ref 135–145)

## 2013-03-10 LAB — CBC
Hemoglobin: 12.6 g/dL (ref 12.0–15.0)
MCH: 29.9 pg (ref 26.0–34.0)
Platelets: 237 10*3/uL (ref 150–400)
RBC: 4.21 MIL/uL (ref 3.87–5.11)
WBC: 7.8 10*3/uL (ref 4.0–10.5)

## 2013-03-10 LAB — HEMOGLOBIN A1C: Mean Plasma Glucose: 108 mg/dL (ref <117)

## 2013-03-10 MED ORDER — METOPROLOL TARTRATE 50 MG PO TABS
50.0000 mg | ORAL_TABLET | Freq: Two times a day (BID) | ORAL | Status: DC
  Administered 2013-03-10 – 2013-03-11 (×4): 50 mg via ORAL
  Filled 2013-03-10 (×4): qty 1

## 2013-03-10 NOTE — Progress Notes (Signed)
   TELEMETRY: Reviewed telemetry pt in NSR, 18 beat burst of Afib with RVR: Filed Vitals:   03/09/13 1830 03/09/13 1900 03/09/13 2009 03/10/13 0547  BP: 131/51 116/55 160/78 147/33  Pulse: 74 79 75 65  Temp:   98.3 F (36.8 C) 98.3 F (36.8 C)  TempSrc:   Oral Oral  Resp: 21 19 20 18   SpO2: 97% 98% 97% 97%   No intake or output data in the 24 hours ending 03/10/13 0854  SUBJECTIVE Denies any chest pain or SOB. No palpitations.  LABS: Basic Metabolic Panel:  Recent Labs  16/10/96 1535 03/10/13 0500  NA 139 141  K 4.1 4.6  CL 100 105  CO2 28 29  GLUCOSE 104* 92  BUN 16 16  CREATININE 0.96 0.92  CALCIUM 8.8 8.8   Liver Function Tests: No results found for this basename: AST, ALT, ALKPHOS, BILITOT, PROT, ALBUMIN,  in the last 72 hours No results found for this basename: LIPASE, AMYLASE,  in the last 72 hours CBC:  Recent Labs  03/09/13 1535 03/10/13 0500  WBC 10.4 7.8  NEUTROABS 6.7  --   HGB 13.3 12.6  HCT 38.4 37.5  MCV 87.3 89.1  PLT 251 237   Cardiac Enzymes:  Recent Labs  03/09/13 2103 03/10/13 0500  TROPONINI 1.47* 1.08*   Fasting Lipid Panel:  Recent Labs  03/10/13 0500  CHOL 201*  HDL 57  LDLCALC 130*  TRIG 70  CHOLHDL 3.5   Thyroid Function Tests: No results found for this basename: TSH, T4TOTAL, FREET3, T3FREE, THYROIDAB,  in the last 72 hours  Radiology/Studies:  Dg Chest 2 View  03/09/2013   *RADIOLOGY REPORT*  Clinical Data: Palpitations.  CHEST - 2 VIEW  Comparison: None.  Findings: Trachea is midline.  Heart size normal.  Biapical pleural parenchymal scarring.  Lungs are hyperinflated but otherwise clear. No pleural fluid.  IMPRESSION: Hyperinflation without acute finding.   Original Report Authenticated By: Leanna Battles, M.D.   Ecg: Normal.  Echo: pending.  PHYSICAL EXAM General: Well developed, well nourished, in no acute distress. Head: Normocephalic, atraumatic, sclera non-icteric, no xanthomas, nares are without  discharge. Neck: Negative for carotid bruits. JVD not elevated. Lungs: Clear bilaterally to auscultation without wheezes, rales, or rhonchi. Breathing is unlabored. Heart: RRR S1 S2 without murmurs, rubs, or gallops.  Abdomen: Soft, non-tender, non-distended with normoactive bowel sounds. No hepatomegaly. No rebound/guarding. No obvious abdominal masses. Msk:  Strength and tone appears normal for age. Extremities: No clubbing, cyanosis or edema.  Distal pedal pulses are 2+ and equal bilaterally. Neuro: Alert and oriented X 3. Moves all extremities spontaneously. Psych:  Responds to questions appropriately with a normal affect.  ASSESSMENT AND PLAN: 1. Paroxysmal Atrial fibrillation. Short burst noted on monitor. I suspect she has been having short episodes of afib for some time based on symptoms of palpitations. Will increase beta blocker therapy. Will consider long term anticoagulation but need to complete cardiac workup. TFTs and Echo pending. 2. NSTEMI- most likely due to demand ischemia type 2. Discussed further ischemic work up including stress testing versus cath. Will plan stress myoview in am. 3. HTN   Principal Problem:   Atrial fibrillation with RVR Active Problems:   Elevated troponin    Signed, Peter [place] MD

## 2013-03-11 ENCOUNTER — Observation Stay (HOSPITAL_COMMUNITY): Payer: Medicare Other

## 2013-03-11 DIAGNOSIS — R079 Chest pain, unspecified: Secondary | ICD-10-CM

## 2013-03-11 MED ORDER — TECHNETIUM TC 99M SESTAMIBI GENERIC - CARDIOLITE
30.0000 | Freq: Once | INTRAVENOUS | Status: AC | PRN
  Administered 2013-03-11: 30 via INTRAVENOUS

## 2013-03-11 MED ORDER — APIXABAN 5 MG PO TABS: 5.0000 mg | ORAL_TABLET | Freq: Two times a day (BID) | ORAL | Status: DC

## 2013-03-11 MED ORDER — METOPROLOL TARTRATE 50 MG PO TABS: 50.0000 mg | ORAL_TABLET | Freq: Two times a day (BID) | ORAL | Status: DC

## 2013-03-11 MED ORDER — APIXABAN 5 MG PO TABS
5.0000 mg | ORAL_TABLET | Freq: Two times a day (BID) | ORAL | Status: DC
  Administered 2013-03-11: 5 mg via ORAL
  Filled 2013-03-11: qty 1

## 2013-03-11 MED ORDER — TECHNETIUM TC 99M SESTAMIBI GENERIC - CARDIOLITE
10.0000 | Freq: Once | INTRAVENOUS | Status: AC | PRN
  Administered 2013-03-11: 10 via INTRAVENOUS

## 2013-03-11 NOTE — Discharge Summary (Signed)
CARDIOLOGY DISCHARGE SUMMARY   Patient ID: Kelli [name] MRN: [id number] DOB/AGE: 10-03-1940 72 y.o.  Admit date: 03/09/2013 Discharge date: 03/11/2013  Primary Discharge Diagnosis:     Atrial fibrillation with RVR - paroxysmal Secondary Discharge Diagnosis:    Elevated troponin   NSTEMI (non-ST elevated myocardial infarction) - felt Type II secondary to Atrial fibrillation.  Procedures: 2D Echocardiogram, [facility] Course: Kelli [name] is a 72 y.o. female with no history of CAD. She began having palpitations and EMS was called. She was found to be in rapid atrial fibrillation. She received IV Cardizem by EMS and spontaneously converted to sinus rhythm. She was admitted for further evaluation and treatment.   Her cardiac enzymes elevated, indicating a NSTEMI. She never had any chest pain. Her TSH was within normal limits. She had no further atrial fibrillation episodes. An echo was performed which showed normal left ventricular function. Her cholesterol was checked and was elevated. She is encouraged to use dietary modification, continue taking fish oil and follow up as an outpatient.  On 03/11/2013, Kelli [name] had a [facility] Cardiol[facility]. The full results are below. There was no scar or ischemia and her EF was preserved. Dr Swaziland evaluated all the laboratory and radiology results. He did not feel any further inpatient workup was indicated. Kelli [name] was considered stable for discharge, to follow up as an outpatient.   Labs  Lab Results  Component Value Date   WBC 7.8 03/10/2013   HGB 12.6 03/10/2013   HCT 37.5 03/10/2013   MCV 89.1 03/10/2013   PLT 237 03/10/2013     Recent Labs Lab 03/10/13 0500  NA 141  K 4.6  CL 105  CO2 29  BUN 16  CREATININE 0.92  CALCIUM 8.8  GLUCOSE 92    Recent Labs  03/09/13 2103 03/10/13 0500 03/10/13 1036  TROPONINI 1.47* 1.08* 1.00*   Lipid Panel     Component Value Date/Time   CHOL 201* 03/10/2013 0500   TRIG 70 03/10/2013  0500   HDL 57 03/10/2013 0500   CHOLHDL 3.5 03/10/2013 0500   VLDL 14 03/10/2013 0500   LDLCALC 130* 03/10/2013 0500    Recent Labs  03/09/13 2230  INR 0.98   Lab Results  Component Value Date   TSH 1.168 03/09/2013     Radiology: Dg Chest 2 View 03/09/2013   *RADIOLOGY REPORT*  Clinical Data: Palpitations.  CHEST - 2 VIEW  Comparison: None.  Findings: Trachea is midline.  Heart size normal.  Biapical pleural parenchymal scarring.  Lungs are hyperinflated but otherwise clear. No pleural fluid.  IMPRESSION: Hyperinflation without acute finding.   Original Report Authenticated By: Leanna Battles, M.D.   Nm Myocar Multi W/spect W/wall Motion / Ef 03/11/2013   This examination was dictated by the cardiologist who supervised the examination.  Please see that report for details.  Identification the patient is a 72 year old with history of chest pain test to evaluate rule out ischemia  Stress data:  The patient exercised in the Bruce protocol. Baseline EKG showed sinus rhythm 68 beats per minute baseline blood pressure 133/67  The patient exercised for 8 minutes 59 seconds to a peak heart rate of 133 which was 89% predicted maximal peak blood pressure 241/47 consistent with hypertensive response.  She complained of no chest pain EKG showed no changes to suggest ischemia  Nuclear data:  The patient was studied in 1-day rest stress protocol she was injected with 10 mCi technetium 99 labeled sestamibi  at rest, 30 mCi technetium 99 labeled sestamibi at stress images were reconstructed the short, vertical, horizontal axes.  In the initial stress images there is overall normal perfusion.  In the recovery images there is mild thinning in the inferolateral wall.  On gating LVEF was calculated at greater than 70%.  Review of the raw data the inferolateral changes at rest most likely reflects soft tissue (diaphragm)  Impression:  Overall normal perfusion.  No evidence for ischemia or scar.  LVEF on gating calculated  greater than 70%.   Original Report Authenticated By: Rudie Meyer, M.D.    EKG:  SR, RATE 60, No acute ischemic changes  Echo: 03/10/2013 Study Conclusions - Left ventricle: The cavity size was normal. Wall thickness was normal. Systolic function was normal. The estimated ejection fraction was in the range of 55% to 60%. Regional wall motion abnormalities cannot be excluded. Doppler parameters are consistent with abnormal left ventricular relaxation (grade 1 diastolic dysfunction). Doppler parameters are consistent with high ventricular filling pressure. - Mitral valve: Calcified annulus. Mild regurgitation. - Left atrium: The atrium was mildly dilated.   FOLLOW UP PLANS AND APPOINTMENTS Allergies  Allergen Reactions  . Augmentin (Amoxicillin-Pot Clavulanate) Itching and Rash     Medication List    TAKE these medications       acetaminophen 500 MG tablet  Commonly known as:  TYLENOL  Take 500 mg by mouth daily as needed for pain.     apixaban 5 MG Tabs tablet  Commonly known as:  ELIQUIS  Take 1 tablet (5 mg total) by mouth 2 (two) times daily.     Calcium + D3 600-200 MG-UNIT Tabs  Take 1 tablet by mouth daily.     docusate sodium 100 MG capsule  Commonly known as:  COLACE  Take 100 mg by mouth at bedtime.     fish oil-omega-3 fatty acids 1000 MG capsule  Take 1 g by mouth daily.     metoprolol 50 MG tablet  Commonly known as:  LOPRESSOR  Take 1 tablet (50 mg total) by mouth 2 (two) times daily.     mometasone 50 MCG/ACT nasal spray  Commonly known as:  NASONEX  Place 2 sprays into the nose daily as needed (for allergies).     vitamin C 500 MG tablet  Commonly known as:  ASCORBIC ACID  Take 500 mg by mouth daily.     Vitamin D 400 UNITS capsule  Take 400 Units by mouth daily.         Future Appointments Provider Department Dept Phone   03/31/2013 9:50 AM Beatrice Lecher, PA-C [place] [facility] Main Office [place]) [phone]     Follow-up  Information   Follow up with Tereso Newcomer, PA-C On 03/31/2013. (See for Dr Swaziland at 9:50 am)    Contact information:   [address] [place] [address] [phone]       BRING ALL MEDICATIONS WITH YOU TO FOLLOW UP APPOINTMENTS  Time spent with patient to include physician time: 38 min Signed: Theodore Demark, PA-C 03/11/2013, 4:34 PM Co-Sign MD

## 2013-03-11 NOTE — Discharge Summary (Signed)
Patient seen and examined and history reviewed. Agree with above findings and plan. Myoview is normal. See earlier rounding note.  Thedora Hinders 03/11/2013 5:24 PM

## 2013-03-11 NOTE — Progress Notes (Signed)
TELEMETRY: Reviewed telemetry pt in NSR, no further Afib  Filed Vitals:   03/10/13 0547 03/10/13 1341 03/10/13 2217 03/11/13 0524  BP: 147/33 139/55 106/61 120/71  Pulse: 65 65 56 62  Temp: 98.3 F (36.8 C) 98.6 F (37 C) 97.8 F (36.6 C) 98.5 F (36.9 C)  TempSrc: Oral Oral Oral Oral  Resp: 18 18 18    SpO2: 97% 97% 97% 98%   No intake or output data in the 24 hours ending 03/11/13 0747  SUBJECTIVE Denies any chest pain or SOB. No palpitations.  LABS: Basic Metabolic Panel:  Recent Labs  16/10/96 1535 03/10/13 0500  NA 139 141  K 4.1 4.6  CL 100 105  CO2 28 29  GLUCOSE 104* 92  BUN 16 16  CREATININE 0.96 0.92  CALCIUM 8.8 8.8   Liver Function Tests: No results found for this basename: AST, ALT, ALKPHOS, BILITOT, PROT, ALBUMIN,  in the last 72 hours No results found for this basename: LIPASE, AMYLASE,  in the last 72 hours CBC:  Recent Labs  03/09/13 1535 03/10/13 0500  WBC 10.4 7.8  NEUTROABS 6.7  --   HGB 13.3 12.6  HCT 38.4 37.5  MCV 87.3 89.1  PLT 251 237   Cardiac Enzymes:  Recent Labs  03/09/13 2103 03/10/13 0500 03/10/13 1036  TROPONINI 1.47* 1.08* 1.00*   Fasting Lipid Panel:  Recent Labs  03/10/13 0500  CHOL 201*  HDL 57  LDLCALC 130*  TRIG 70  CHOLHDL 3.5   Thyroid Function Tests:  Recent Labs  03/09/13 2230  TSH 1.168    Radiology/Studies:  Dg Chest 2 View  03/09/2013   *RADIOLOGY REPORT*  Clinical Data: Palpitations.  CHEST - 2 VIEW  Comparison: None.  Findings: Trachea is midline.  Heart size normal.  Biapical pleural parenchymal scarring.  Lungs are hyperinflated but otherwise clear. No pleural fluid.  IMPRESSION: Hyperinflation without acute finding.   Original Report Authenticated By: Leanna Battles, M.D.   Ecg: Normal.  Echo: Study Conclusions  - Left ventricle: The cavity size was normal. Wall thickness was normal. Systolic function was normal. The estimated ejection fraction was in the range of 55% to 60%.  Regional wall motion abnormalities cannot be excluded. Doppler parameters are consistent with abnormal left ventricular relaxation (grade 1 diastolic dysfunction). Doppler parameters are consistent with high ventricular filling pressure. - Mitral valve: Calcified annulus. Mild regurgitation. - Left atrium: The atrium was mildly dilated.    PHYSICAL EXAM General: Well developed, well nourished, in no acute distress. Head: Normocephalic, atraumatic, sclera non-icteric, no xanthomas, nares are without discharge. Neck: Negative for carotid bruits. JVD not elevated. Lungs: Clear bilaterally to auscultation without wheezes, rales, or rhonchi. Breathing is unlabored. Heart: RRR S1 S2 without murmurs, rubs, or gallops.  Abdomen: Soft, non-tender, non-distended with normoactive bowel sounds. No hepatomegaly. No rebound/guarding. No obvious abdominal masses. Msk:  Strength and tone appears normal for age. Extremities: No clubbing, cyanosis or edema.  Distal pedal pulses are 2+ and equal bilaterally. Neuro: Alert and oriented X 3. Moves all extremities spontaneously. Psych:  Responds to questions appropriately with a normal affect.  ASSESSMENT AND PLAN: 1. Paroxysmal Atrial fibrillation. Tolerated metoprolol well.  2. NSTEMI- most likely due to demand ischemia type 2. Plan stress myoview today. 3. HTN   If myoview today is normal plan DC to home. Will treat with metoprolol. Recommend switching ASA to Eliquis 5 mg bid. Follow up in office in 2 weeks. If myoview is abnormal she will need  cardiac cath.  Principal Problem:   Atrial fibrillation with RVR Active Problems:   Elevated troponin   NSTEMI (non-ST elevated myocardial infarction)    Signed, Kwesi Sangha [place] MD,FACC 03/11/2013 7:50 AM

## 2013-03-12 NOTE — ED Provider Notes (Signed)
Medical screening examination/treatment/procedure(s) were conducted as a shared visit with non-physician practitioner(s) and myself.  I personally evaluated the patient during the encounter  Doug Sou, MD 03/12/13 1101

## 2013-03-31 ENCOUNTER — Encounter: Payer: Self-pay | Admitting: Physician Assistant

## 2013-03-31 ENCOUNTER — Ambulatory Visit (INDEPENDENT_AMBULATORY_CARE_PROVIDER_SITE_OTHER): Payer: Medicare Other | Admitting: Physician Assistant

## 2013-03-31 VITALS — BP 130/72 | HR 53 | Ht 63.0 in | Wt 157.0 lb

## 2013-03-31 DIAGNOSIS — I214 Non-ST elevation (NSTEMI) myocardial infarction: Secondary | ICD-10-CM

## 2013-03-31 DIAGNOSIS — I4891 Unspecified atrial fibrillation: Secondary | ICD-10-CM

## 2013-03-31 NOTE — Progress Notes (Signed)
[address] [place], [place]  [address] Phone: [phone] Fax:  [phone]  Date:  03/31/2013   ID:  Kelli [name], DOB 1941/08/20, MRN [id number]  PCP:  Londell Moh, MD  Cardiologist:  Dr. Peter Swaziland     History of Present Illness: Kelli [name] is a 72 y.o. female who returns for follow up after a recent admission to the hospital 6/25-6/27 with a non-STEMI felt to be from demand ischemia in the setting of atrial fibrillation with RVR. She presented with complaints of palpitations, chest discomfort and shortness of breath. She was found to be in atrial fibrillation with RVR and received Cardizem by EMS. She converted to NSR. She had short bursts of atrial fibrillation while hospitalized. Cardiac markers were abnormal ruling her in for a non-STEMI. Echocardiogram 03/10/13: EF 55-60%, grade 1 diastolic dysfunction, mild MR, mild LAE. Given her elevated cardiac markers, inpatient Myoview was arranged. ETT-Myoview 03/11/13: No ischemia or scar, EF 70%. CHADS2-VASc=2.  It was ultimately decided to place her on Eliquis for anticoagulation at discharge.  Since discharge, she is feeling well. She denies chest pain, significant dyspnea, orthopnea, PND or edema. She has occasional flutters but denies rapid palpitations like she had with atrial fibrillation.  Labs (6/14):  K 4.6, Cr 0.92, LDL 130, Hgb 12.6, TSH 1.168   Wt Readings from Last 3 Encounters:  03/31/13 157 lb (71.215 kg)     Past Medical History  Diagnosis Date  . Hypertension   . NSTEMI (non-ST elevated myocardial infarction) 02/2013    demand ischemia in setting of AFib with RVR; ETT-Myoview 03/11/13: No ischemia or scar, EF 70%.  . Atrial fibrillation 02/2013    paroxsymal; c/b Type 2 NSTEMI; CHADS2-VASc=2 (age, sex - patient had come off anti-HTN meds in past); Eliquis started;  Echocardiogram 03/10/13: EF 55-60%, grade 1 diastolic dysfunction, mild MR, mild LAE    Current Outpatient Prescriptions    Medication Sig Dispense Refill  . acetaminophen (TYLENOL) 500 MG tablet Take 500 mg by mouth daily as needed for pain.      [name] apixaban (ELIQUIS) 5 MG TABS tablet Take 1 tablet (5 mg total) by mouth 2 (two) times daily.  60 tablet  11  . Calcium Carb-Cholecalciferol (CALCIUM + D3) 600-200 MG-UNIT TABS Take 1 tablet by mouth daily.      . Cholecalciferol (VITAMIN D) 400 UNITS capsule Take 400 Units by mouth daily.      [name] docusate sodium (COLACE) 100 MG capsule Take 100 mg by mouth at bedtime.      . fish oil-omega-3 fatty acids 1000 MG capsule Take 1 g by mouth daily.      . metoprolol (LOPRESSOR) 50 MG tablet Take 1 tablet (50 mg total) by mouth 2 (two) times daily.  60 tablet  11  . mometasone (NASONEX) 50 MCG/ACT nasal spray Place 2 sprays into the nose daily as needed (for allergies).      . vitamin C (ASCORBIC ACID) 500 MG tablet Take 500 mg by mouth daily.       No current facility-administered medications for this visit.    Allergies:    Allergies  Allergen Reactions  . Augmentin (Amoxicillin-Pot Clavulanate) Itching and Rash    Social History:  The patient  reports that she has never smoked. She has never used smokeless tobacco. She reports that she does not drink alcohol or use illicit drugs.   ROS:  Please see the history of present illness.   No  melena or hematochezia.   All other systems reviewed and negative.   PHYSICAL EXAM: VS:  BP 130/72  Pulse 53  Ht 5\' 3"  (1.6 m)  Wt 157 lb (71.215 kg)  BMI 27.82 kg/m2 Well nourished, well developed, in no acute distress HEENT: normal Neck: no JVD Cardiac:  normal S1, S2; RRR; no murmur Lungs:  clear to auscultation bilaterally, no wheezing, rhonchi or rales Abd: soft, nontender, no hepatomegaly Ext: no edema Skin: warm and dry Neuro:  CNs 2-12 intact, no focal abnormalities noted  EKG:  Sinus bradycardia, HR 53, normal axis, no acute changes     ASSESSMENT AND PLAN:  1. Paroxysmal Atrial Fibrillation:  Maintaining sinus  rhythm. Continue Eliquis. Continue metoprolol. She has had some dizziness and fatigue. If this continues, she can decrease her metoprolol to 25 mg twice a day. Check followup basic metabolic panel and CBC at next office visit. 2. Type II Non-STEMI:  Demand ischemia from atrial fibrillation with RVR. No aspirin as she is on Eliquis. As noted, Myoview was low risk. 3. Disposition:  Follow up with Dr. Swaziland in 6 weeks.  Signed, Tereso Newcomer, PA-C  03/31/2013 10:16 AM

## 2013-03-31 NOTE — Patient Instructions (Addendum)
If your dizziness/fatigue worse ok to decrease your Metoprolol to 25 mg twice a day otherwise continue your same medications  Your physician recommends that you schedule a follow-up appointment in: 6 weeks ov/bmet/cbc with Dr Swaziland

## 2013-04-07 ENCOUNTER — Other Ambulatory Visit: Payer: Self-pay

## 2013-04-07 DIAGNOSIS — I4891 Unspecified atrial fibrillation: Secondary | ICD-10-CM

## 2013-04-20 ENCOUNTER — Other Ambulatory Visit: Payer: Self-pay

## 2013-05-11 ENCOUNTER — Ambulatory Visit (INDEPENDENT_AMBULATORY_CARE_PROVIDER_SITE_OTHER): Payer: Medicare Other | Admitting: Cardiology

## 2013-05-11 ENCOUNTER — Encounter: Payer: Self-pay | Admitting: Cardiology

## 2013-05-11 ENCOUNTER — Other Ambulatory Visit (INDEPENDENT_AMBULATORY_CARE_PROVIDER_SITE_OTHER): Payer: Medicare Other

## 2013-05-11 VITALS — BP 136/45 | HR 60 | Ht 63.0 in | Wt 156.0 lb

## 2013-05-11 DIAGNOSIS — I4891 Unspecified atrial fibrillation: Secondary | ICD-10-CM

## 2013-05-11 DIAGNOSIS — I214 Non-ST elevation (NSTEMI) myocardial infarction: Secondary | ICD-10-CM

## 2013-05-11 LAB — BASIC METABOLIC PANEL
Calcium: 8.9 mg/dL (ref 8.4–10.5)
GFR: 59.85 mL/min — ABNORMAL LOW (ref >60.00)
Glucose, Bld: 115 mg/dL — ABNORMAL HIGH (ref 70–99)
Sodium: 136 mEq/L (ref 135–145)

## 2013-05-11 LAB — CBC WITH DIFFERENTIAL/PLATELET
Basophils Absolute: 0 10*3/uL (ref 0.0–0.1)
Hemoglobin: 12.9 g/dL (ref 12.0–15.0)
Lymphocytes Relative: 37.4 % (ref 12.0–46.0)
Monocytes Relative: 7.1 % (ref 3.0–12.0)
Neutro Abs: 4.5 10*3/uL (ref 1.4–7.7)
RDW: 13.1 % (ref 11.5–14.6)

## 2013-05-11 MED ORDER — APIXABAN 5 MG PO TABS: 5.0000 mg | ORAL_TABLET | Freq: Two times a day (BID) | ORAL | Status: DC

## 2013-05-11 MED ORDER — METOPROLOL TARTRATE 50 MG PO TABS: 50.0000 mg | ORAL_TABLET | Freq: Two times a day (BID) | ORAL | Status: DC

## 2013-05-11 NOTE — Patient Instructions (Addendum)
Continue your current therapy  I will see you in 4 months  

## 2013-05-11 NOTE — Progress Notes (Signed)
Kelli [name] Date of Birth: May 30, 1941 Medical Record #[id number]  History of Present Illness: Kelli [name] was admitted in [place] of this year with a non-ST elevation myocardial infarction. This was in the setting of atrial fibrillation with rapid ventricular response. She converted to sinus rhythm but continued to have short bursts of atrial fibrillation. She was managed with rate control and anticoagulation. Echocardiogram showed normal LV function with mild mitral insufficiency and mild left atrial enlargement. A stress Myoview study was normal. On followup today she reports she is doing well. She has experienced a little bit of heartburn. She has noticed rare vague symptoms of palpitations. She does complain of a little bit of shortness of breath. She's had no chest pain.  Current Outpatient Prescriptions on File Prior to Visit  Medication Sig Dispense Refill  . acetaminophen (TYLENOL) 500 MG tablet Take 500 mg by mouth daily as needed for pain.      . Calcium Carb-Cholecalciferol (CALCIUM + D3) 600-200 MG-UNIT TABS Take 1 tablet by mouth daily.      . Cholecalciferol (VITAMIN D) 400 UNITS capsule Take 400 Units by mouth daily.      [name] docusate sodium (COLACE) 100 MG capsule Take 100 mg by mouth at bedtime.      . fish oil-omega-3 fatty acids 1000 MG capsule Take 1 g by mouth daily.      . mometasone (NASONEX) 50 MCG/ACT nasal spray Place 2 sprays into the nose daily as needed (for allergies).      . vitamin C (ASCORBIC ACID) 500 MG tablet Take 500 mg by mouth daily.       No current facility-administered medications on file prior to visit.    Allergies  Allergen Reactions  . Augmentin [Amoxicillin-Pot Clavulanate] Itching and Rash    Past Medical History  Diagnosis Date  . Hypertension   . NSTEMI (non-ST elevated myocardial infarction) 02/2013    demand ischemia in setting of AFib with RVR; ETT-Myoview 03/11/13: No ischemia or scar, EF 70%.  . Atrial fibrillation 02/2013   paroxsymal; c/b Type 2 NSTEMI; CHADS2-VASc=2 (age, sex - patient had come off anti-HTN meds in past); Eliquis started;  Echocardiogram 03/10/13: EF 55-60%, grade 1 diastolic dysfunction, mild MR, mild LAE    Past Surgical History  Procedure Laterality Date  . Eye surgery    . Cataract extraction, bilateral    . Appendectomy    . Tonsillectomy      History  Smoking status  . Never Smoker   Smokeless tobacco  . Never Used    History  Alcohol Use No    History reviewed. No pertinent family history.  Review of Systems: As noted in history of present illness..  All other systems were reviewed and are negative.  Physical Exam: BP 136/45  Pulse 60  Ht 5\' 3"  (1.6 m)  Wt 156 lb (70.761 kg)  BMI 27.64 kg/m2 She is a pleasant, elderly white female in no acute distress. HEENT: Normal Neck: No JVD or bruits. No adenopathy or thyromegaly. Lungs: Clear Cardiac: Regular rate and rhythm, normal S1-S2, no gallop or murmur. Abdomen: Soft and nontender. No masses. Extremities: No cyanosis or edema. Pulses are 2+. Skin: Warm and dry Neuro: Alert and oriented x3. Cranial nerves II through XII are intact.  LABORATORY DATA:   Assessment / Plan: 1. Atrial fibrillation. Patient is asymptomatic at this point. We will continue rate control with metoprolol and continue anticoagulation with Eliquis. I will followup again in 4 months. 2. Non-ST  elevation myocardial infarction secondary to demand ischemia from her atrial fibrillation. Myoview was normal. She is asymptomatic at this time.

## 2013-07-21 ENCOUNTER — Other Ambulatory Visit: Payer: Self-pay

## 2013-08-18 ENCOUNTER — Encounter: Payer: Self-pay | Admitting: Cardiology

## 2013-08-18 ENCOUNTER — Ambulatory Visit (INDEPENDENT_AMBULATORY_CARE_PROVIDER_SITE_OTHER): Payer: Medicare Other | Admitting: Cardiology

## 2013-08-18 VITALS — BP 142/67 | HR 58 | Ht 63.0 in | Wt 160.0 lb

## 2013-08-18 DIAGNOSIS — R0609 Other forms of dyspnea: Secondary | ICD-10-CM | POA: Insufficient documentation

## 2013-08-18 DIAGNOSIS — R5383 Other fatigue: Secondary | ICD-10-CM

## 2013-08-18 DIAGNOSIS — R5381 Other malaise: Secondary | ICD-10-CM

## 2013-08-18 DIAGNOSIS — I4891 Unspecified atrial fibrillation: Secondary | ICD-10-CM

## 2013-08-18 MED ORDER — METOPROLOL TARTRATE 50 MG PO TABS: 25.0000 mg | ORAL_TABLET | Freq: Two times a day (BID) | ORAL | Status: DC

## 2013-08-18 NOTE — Patient Instructions (Signed)
Reduce metoprolol to 25 mg twice a day  Continue your other therapy  Work on your exercise and diet  I will see you in 6 months with fasting lab work

## 2013-08-18 NOTE — Progress Notes (Signed)
Kelli [name] Date of Birth: 07/11/1941 Medical Record #[id number]  History of Present Illness: Kelli [name] is seen for followup today. In [place] of 2014 she was admitted with atrial fibrillation and a rapid ventricular response. This was associated with elevated troponins. She converted to sinus rhythm but continued to have short bursts of atrial fibrillation. She was managed with rate control and anticoagulation. Echocardiogram showed normal LV function with mild mitral insufficiency and mild left atrial enlargement. A stress Myoview study was normal. On followup today she denies any recurrent palpitations. She does feel fatigued and a little short of breath with exertion. She states that Dr. Renne Crigler wanted to put her on cholesterol medicine but she wanted to try lifestyle modification.  Current Outpatient Prescriptions on File Prior to Visit  Medication Sig Dispense Refill  . acetaminophen (TYLENOL) 500 MG tablet Take 500 mg by mouth daily as needed for pain.      [name] apixaban (ELIQUIS) 5 MG TABS tablet Take 1 tablet (5 mg total) by mouth 2 (two) times daily.  180 tablet  3  . Calcium Carb-Cholecalciferol (CALCIUM + D3) 600-200 MG-UNIT TABS Take 1 tablet by mouth daily.      . Cholecalciferol (VITAMIN D) 400 UNITS capsule Take 400 Units by mouth daily.      [name] docusate sodium (COLACE) 100 MG capsule Take 100 mg by mouth at bedtime.      . fish oil-omega-3 fatty acids 1000 MG capsule Take 1 g by mouth daily.      . mometasone (NASONEX) 50 MCG/ACT nasal spray Place 2 sprays into the nose daily as needed (for allergies).      . Multiple Vitamin (MULTIVITAMIN) capsule Take 1 capsule by mouth daily.      . vitamin C (ASCORBIC ACID) 500 MG tablet Take 500 mg by mouth daily.       No current facility-administered medications on file prior to visit.    Allergies  Allergen Reactions  . Augmentin [Amoxicillin-Pot Clavulanate] Itching and Rash    Past Medical History  Diagnosis Date  . Hypertension    . NSTEMI (non-ST elevated myocardial infarction) 02/2013    demand ischemia in setting of AFib with RVR; ETT-Myoview 03/11/13: No ischemia or scar, EF 70%.  . Atrial fibrillation 02/2013    paroxsymal; c/b Type 2 NSTEMI; CHADS2-VASc=2 (age, sex - patient had come off anti-HTN meds in past); Eliquis started;  Echocardiogram 03/10/13: EF 55-60%, grade 1 diastolic dysfunction, mild MR, mild LAE    Past Surgical History  Procedure Laterality Date  . Eye surgery    . Cataract extraction, bilateral    . Appendectomy    . Tonsillectomy      History  Smoking status  . Never Smoker   Smokeless tobacco  . Never Used    History  Alcohol Use No    History reviewed. No pertinent family history.  Review of Systems: As noted in history of present illness..  All other systems were reviewed and are negative.  Physical Exam: BP 142/67  Pulse 58  Ht 5\' 3"  (1.6 m)  Wt 160 lb (72.576 kg)  BMI 28.35 kg/m2 She is a pleasant, elderly white female in no acute distress. HEENT: Normal Neck: No JVD or bruits. No adenopathy or thyromegaly. Lungs: Clear Cardiac: Regular rate and rhythm, normal S1-S2, no gallop or murmur. Abdomen: Soft and nontender. No masses. Extremities: No cyanosis or edema. Pulses are 2+. Skin: Warm and dry Neuro: Alert and oriented x3. Cranial nerves II  through XII are intact.  LABORATORY DATA: Lab Results  Component Value Date   WBC 8.7 05/11/2013   HGB 12.9 05/11/2013   HCT 38.4 05/11/2013   PLT 216.0 05/11/2013   GLUCOSE 115* 05/11/2013   CHOL 201* 03/10/2013   TRIG 70 03/10/2013   HDL 57 03/10/2013   LDLCALC 130* 03/10/2013   NA 136 05/11/2013   K 4.9 05/11/2013   CL 102 05/11/2013   CREATININE 1.0 05/11/2013   BUN 18 05/11/2013   CO2 29 05/11/2013   TSH 1.168 03/09/2013   INR 0.98 03/09/2013   HGBA1C 5.4 03/09/2013     Assessment / Plan: 1. Atrial fibrillation. No symptomatic recurrence. She is on anticoagulation with Eliquis. 2. Non-ST elevation myocardial infarction  secondary to demand ischemia from her atrial fibrillation. Myoview was normal. She is asymptomatic at this time. 3. Fatigue and dyspnea. I suspect this is related to metoprolol. We will reduce her dose to 25 mg twice a day. 4. Hypercholesterolemia. Baseline LDL of 130. She is going to try lifestyle modification with dietary modification, weight loss, and exercise. We will recheck in 6 months.

## 2013-12-06 ENCOUNTER — Other Ambulatory Visit: Payer: Self-pay

## 2013-12-06 DIAGNOSIS — Z1231 Encounter for screening mammogram for malignant neoplasm of breast: Secondary | ICD-10-CM

## 2013-12-23 ENCOUNTER — Encounter (HOSPITAL_COMMUNITY): Payer: Self-pay | Admitting: Emergency Medicine

## 2013-12-23 ENCOUNTER — Emergency Department (HOSPITAL_COMMUNITY): Payer: Medicare Other

## 2013-12-23 ENCOUNTER — Inpatient Hospital Stay (HOSPITAL_COMMUNITY)
Admission: EM | Admit: 2013-12-23 | Discharge: 2013-12-27 | DRG: 392 | Disposition: A | Discharge status: Home / Self Care | Payer: Medicare Other | Attending: Internal Medicine | Admitting: Internal Medicine

## 2013-12-23 ENCOUNTER — Emergency Department (HOSPITAL_COMMUNITY)
Admission: EM | Admit: 2013-12-23 | Discharge: 2013-12-23 | Disposition: A | Discharge status: Nursing Home (Includes ICF) | Payer: Medicare Other | Source: Home / Self Care | Attending: Emergency Medicine | Admitting: Emergency Medicine

## 2013-12-23 DIAGNOSIS — K279 Peptic ulcer, site unspecified, unspecified as acute or chronic, without hemorrhage or perforation: Secondary | ICD-10-CM | POA: Diagnosis present

## 2013-12-23 DIAGNOSIS — R5383 Other fatigue: Secondary | ICD-10-CM

## 2013-12-23 DIAGNOSIS — K5732 Diverticulitis of large intestine without perforation or abscess without bleeding: Secondary | ICD-10-CM

## 2013-12-23 DIAGNOSIS — Z7901 Long term (current) use of anticoagulants: Secondary | ICD-10-CM

## 2013-12-23 DIAGNOSIS — K5792 Diverticulitis of intestine, part unspecified, without perforation or abscess without bleeding: Secondary | ICD-10-CM

## 2013-12-23 DIAGNOSIS — K219 Gastro-esophageal reflux disease without esophagitis: Secondary | ICD-10-CM | POA: Diagnosis present

## 2013-12-23 DIAGNOSIS — I10 Essential (primary) hypertension: Secondary | ICD-10-CM | POA: Diagnosis present

## 2013-12-23 DIAGNOSIS — R7989 Other specified abnormal findings of blood chemistry: Secondary | ICD-10-CM

## 2013-12-23 DIAGNOSIS — I4891 Unspecified atrial fibrillation: Secondary | ICD-10-CM

## 2013-12-23 DIAGNOSIS — Z888 Allergy status to other drugs, medicaments and biological substances status: Secondary | ICD-10-CM

## 2013-12-23 DIAGNOSIS — K5909 Other constipation: Secondary | ICD-10-CM | POA: Diagnosis present

## 2013-12-23 DIAGNOSIS — K5900 Constipation, unspecified: Secondary | ICD-10-CM

## 2013-12-23 DIAGNOSIS — R778 Other specified abnormalities of plasma proteins: Secondary | ICD-10-CM

## 2013-12-23 DIAGNOSIS — I252 Old myocardial infarction: Secondary | ICD-10-CM

## 2013-12-23 DIAGNOSIS — K5780 Diverticulitis of intestine, part unspecified, with perforation and abscess without bleeding: Secondary | ICD-10-CM

## 2013-12-23 DIAGNOSIS — I214 Non-ST elevation (NSTEMI) myocardial infarction: Secondary | ICD-10-CM

## 2013-12-23 LAB — COMPREHENSIVE METABOLIC PANEL
ALBUMIN: 3.5 g/dL (ref 3.5–5.2)
ALT: 20 U/L (ref 0–35)
AST: 26 U/L (ref 0–37)
Alkaline Phosphatase: 57 U/L (ref 39–117)
BUN: 23 mg/dL (ref 6–23)
CALCIUM: 9 mg/dL (ref 8.4–10.5)
CO2: 24 mEq/L (ref 19–32)
Chloride: 97 mEq/L (ref 96–112)
Creatinine, Ser: 0.93 mg/dL (ref 0.50–1.10)
GFR calc non Af Amer: 60 mL/min — ABNORMAL LOW (ref >90)
GFR, EST AFRICAN AMERICAN: 69 mL/min — AB (ref >90)
GLUCOSE: 98 mg/dL (ref 70–99)
POTASSIUM: 4.4 meq/L (ref 3.7–5.3)
Sodium: 136 mEq/L — ABNORMAL LOW (ref 137–147)
TOTAL PROTEIN: 7.4 g/dL (ref 6.0–8.3)
Total Bilirubin: 0.8 mg/dL (ref 0.3–1.2)

## 2013-12-23 LAB — CBC WITH DIFFERENTIAL/PLATELET
BASOS ABS: 0 10*3/uL (ref 0.0–0.1)
Basophils Relative: 0 % (ref 0–1)
EOS PCT: 1 % (ref 0–5)
Eosinophils Absolute: 0.1 10*3/uL (ref 0.0–0.7)
HEMATOCRIT: 37.3 % (ref 36.0–46.0)
HEMOGLOBIN: 13.1 g/dL (ref 12.0–15.0)
LYMPHS ABS: 2.7 10*3/uL (ref 0.7–4.0)
LYMPHS PCT: 13 % (ref 12–46)
MCH: 31.8 pg (ref 26.0–34.0)
MCHC: 35.1 g/dL (ref 30.0–36.0)
MCV: 90.5 fL (ref 78.0–100.0)
MONO ABS: 1.5 10*3/uL — AB (ref 0.1–1.0)
MONOS PCT: 8 % (ref 3–12)
NEUTROS ABS: 15.7 10*3/uL — AB (ref 1.7–7.7)
Neutrophils Relative %: 78 % — ABNORMAL HIGH (ref 43–77)
Platelets: 237 10*3/uL (ref 150–400)
RBC: 4.12 MIL/uL (ref 3.87–5.11)
RDW: 13.9 % (ref 11.5–15.5)
WBC: 20.1 10*3/uL — AB (ref 4.0–10.5)

## 2013-12-23 LAB — URINALYSIS, ROUTINE W REFLEX MICROSCOPIC
BILIRUBIN URINE: NEGATIVE
GLUCOSE, UA: NEGATIVE mg/dL
Ketones, ur: >80 mg/dL — AB
NITRITE: NEGATIVE
PH: 5.5 (ref 5.0–8.0)
Protein, ur: 30 mg/dL — AB
SPECIFIC GRAVITY, URINE: 1.03 (ref 1.005–1.030)
Urobilinogen, UA: 0.2 mg/dL (ref 0.0–1.0)

## 2013-12-23 LAB — URINE MICROSCOPIC-ADD ON

## 2013-12-23 LAB — I-STAT CG4 LACTIC ACID, ED: Lactic Acid, Venous: 0.81 mmol/L (ref 0.5–2.2)

## 2013-12-23 MED ORDER — IOHEXOL 300 MG/ML  SOLN
25.0000 mL | Freq: Once | INTRAMUSCULAR | Status: AC | PRN
  Administered 2013-12-23: 25 mL via ORAL

## 2013-12-23 MED ORDER — IOHEXOL 300 MG/ML  SOLN
80.0000 mL | Freq: Once | INTRAMUSCULAR | Status: AC | PRN
  Administered 2013-12-23: 80 mL via INTRAVENOUS

## 2013-12-23 NOTE — ED Notes (Signed)
[facility] sent her for further evaluation of abd pain since yesterday. They were concerned for diverticulitis.

## 2013-12-23 NOTE — ED Provider Notes (Signed)
  Chief Complaint   Chief Complaint  Patient presents with  . Abdominal Pain    History of Present Illness   Kelli [name] is a 73 year old female who has a history since yesterday afternoon of bilateral lower abdominal pain which has gradually radiated to the upper abdomen. She had a temperature 100.1 degrees last night. She's had a both constipation and diarrhea with some hemorrhoidal bleeding she denies any nausea or vomiting her appetite has been good. She denies any urinary or GYN complaints. She is status post appendectomy. She's never had diverticulosis or diverticulitis. She did have a colonoscopy 7 years ago but states this was negative.  Review of Systems   Other than as noted above, the patient denies any of the following symptoms: Constitutional:  No fever, chills, weight loss or anorexia. Abdomen:  No nausea, vomiting, hematememesis, melena, diarrhea, or hematochezia. GU:  No dysuria, frequency, urgency, or hematuria. Gyn:  No vaginal discharge, itching, abnormal bleeding, dyspareunia, or pelvic pain.  [name]   Past medical history, family history, social history, meds, and allergies were reviewed. She is allergic to Augmentin. She has a history of hypertension, atrial fibrillation, and a myocardial infarction. She takes Eliquis and Lopressor.  Physical Exam     Vital signs:  BP 149/57  Pulse 90  Temp(Src) 98.2 F (36.8 C) (Oral)  SpO2 100% Gen:  Alert, oriented, in no distress. Lungs:  Breath sounds clear and equal bilaterally.  No wheezes, rales or rhonchi. Heart:  Regular rhythm.  No gallops or murmers.   Abdomen:  Soft, flat, nondistended. She has tenderness to palpation across the entire lower abdomen without guarding or rebound. No organomegaly or mass. Bowel sounds are hyperactive. Skin:  Clear, warm and dry.  No rash.  EKG Results:  Date: 12/23/2013  Rate: 90  Rhythm: normal sinus rhythm and premature atrial contractions (PAC)  QRS Axis: normal  Intervals:  normal  ST/T Wave abnormalities: normal  Conduction Disutrbances:none  Narrative Interpretation: Normal sinus rhythm with premature atrial complexes, otherwise normal EKG.  Old EKG Reviewed: none available   Assessment   The encounter diagnosis was Diverticulitis.  Plan     The patient was transferred to the ED via shuttle in stable condition.  Medical Decision Making:  73 year old female has 2 day history of lower abdominal pain and fever.  No nausea or vomiting.  Has had both constipation and diarrhea.  On exam she is tender over entire lower abdomen.  BS active.  Has had an appendectomy.  Her EKG was normal.  My impression is diverticulitis.  Needs further evaluation.  Will send by shuttle.      Harden Mo, MD 12/23/13 [phone]

## 2013-12-23 NOTE — Discharge Instructions (Signed)
We have determined that your problem requires further evaluation in the emergency department.  We will take care of your transport there.  Once at the emergency department, you will be evaluated by a provider and they will order whatever treatment or tests they deem necessary.  We cannot guarantee that they will do any specific test or do any specific treatment.  ° °

## 2013-12-23 NOTE — ED Notes (Addendum)
Pt c/o abd pain onset Thursday Pain increases w/activity and cough Pain has now radiated to chest; "feels like heartburn" Has also had fevers and feeling fatigue Denies SOB, diaphoresis, n/v Hx of Afib and myocardial infarction.  Alert and talking in complete sentences w/no signs of acute distress.

## 2013-12-23 NOTE — ED Notes (Signed)
PA lawyer at the bedside.

## 2013-12-24 DIAGNOSIS — K5780 Diverticulitis of intestine, part unspecified, with perforation and abscess without bleeding: Secondary | ICD-10-CM | POA: Diagnosis present

## 2013-12-24 DIAGNOSIS — I4891 Unspecified atrial fibrillation: Secondary | ICD-10-CM

## 2013-12-24 DIAGNOSIS — K5732 Diverticulitis of large intestine without perforation or abscess without bleeding: Principal | ICD-10-CM

## 2013-12-24 DIAGNOSIS — K5900 Constipation, unspecified: Secondary | ICD-10-CM

## 2013-12-24 DIAGNOSIS — K5909 Other constipation: Secondary | ICD-10-CM | POA: Diagnosis present

## 2013-12-24 LAB — COMPREHENSIVE METABOLIC PANEL
ALT: 15 U/L (ref 0–35)
AST: 20 U/L (ref 0–37)
Albumin: 3 g/dL — ABNORMAL LOW (ref 3.5–5.2)
Alkaline Phosphatase: 59 U/L (ref 39–117)
BUN: 17 mg/dL (ref 6–23)
CO2: 25 mEq/L (ref 19–32)
Calcium: 8.5 mg/dL (ref 8.4–10.5)
Chloride: 98 mEq/L (ref 96–112)
Creatinine, Ser: 0.88 mg/dL (ref 0.50–1.10)
GFR calc Af Amer: 74 mL/min — ABNORMAL LOW (ref >90)
GFR calc non Af Amer: 64 mL/min — ABNORMAL LOW (ref >90)
Glucose, Bld: 77 mg/dL (ref 70–99)
Potassium: 4.5 mEq/L (ref 3.7–5.3)
Sodium: 137 mEq/L (ref 137–147)
Total Bilirubin: 1 mg/dL (ref 0.3–1.2)
Total Protein: 6.6 g/dL (ref 6.0–8.3)

## 2013-12-24 LAB — PROTIME-INR
INR: 1.23 (ref 0.00–1.49)
Prothrombin Time: 15.2 seconds (ref 11.6–15.2)

## 2013-12-24 LAB — CBC
HCT: 35.1 % — ABNORMAL LOW (ref 36.0–46.0)
Hemoglobin: 11.7 g/dL — ABNORMAL LOW (ref 12.0–15.0)
MCH: 30.5 pg (ref 26.0–34.0)
MCHC: 33.3 g/dL (ref 30.0–36.0)
MCV: 91.4 fL (ref 78.0–100.0)
Platelets: 224 10*3/uL (ref 150–400)
RBC: 3.84 MIL/uL — ABNORMAL LOW (ref 3.87–5.11)
RDW: 14.2 % (ref 11.5–15.5)
WBC: 18.8 10*3/uL — ABNORMAL HIGH (ref 4.0–10.5)

## 2013-12-24 MED ORDER — METOPROLOL TARTRATE 12.5 MG HALF TABLET
12.5000 mg | ORAL_TABLET | Freq: Two times a day (BID) | ORAL | Status: DC
  Administered 2013-12-24 – 2013-12-27 (×5): 12.5 mg via ORAL
  Filled 2013-12-24 (×10): qty 1

## 2013-12-24 MED ORDER — METRONIDAZOLE IN NACL 5-0.79 MG/ML-% IV SOLN: 500.0000 mg | Freq: Once | INTRAVENOUS | Status: DC

## 2013-12-24 MED ORDER — SODIUM CHLORIDE 0.9 % IV SOLN
INTRAVENOUS | Status: DC
  Administered 2013-12-24 – 2013-12-27 (×5): via INTRAVENOUS

## 2013-12-24 MED ORDER — PANTOPRAZOLE SODIUM 40 MG IV SOLR
40.0000 mg | INTRAVENOUS | Status: DC
  Administered 2013-12-24 – 2013-12-26 (×3): 40 mg via INTRAVENOUS
  Filled 2013-12-24 (×6): qty 40

## 2013-12-24 MED ORDER — CIPROFLOXACIN IN D5W 400 MG/200ML IV SOLN
400.0000 mg | Freq: Two times a day (BID) | INTRAVENOUS | Status: DC
  Administered 2013-12-24 – 2013-12-26 (×6): 400 mg via INTRAVENOUS
  Filled 2013-12-24 (×8): qty 200

## 2013-12-24 MED ORDER — MORPHINE SULFATE 2 MG/ML IJ SOLN
1.0000 mg | INTRAMUSCULAR | Status: DC | PRN
  Administered 2013-12-24 (×2): 2 mg via INTRAVENOUS
  Filled 2013-12-24 (×2): qty 1

## 2013-12-24 MED ORDER — HEPARIN SODIUM (PORCINE) 5000 UNIT/ML IJ SOLN
5000.0000 [IU] | Freq: Three times a day (TID) | INTRAMUSCULAR | Status: DC
  Administered 2013-12-24 – 2013-12-27 (×9): 5000 [IU] via SUBCUTANEOUS
  Filled 2013-12-24 (×14): qty 1

## 2013-12-24 MED ORDER — CIPROFLOXACIN IN D5W 400 MG/200ML IV SOLN
400.0000 mg | Freq: Two times a day (BID) | INTRAVENOUS | Status: DC
  Administered 2013-12-24: 400 mg via INTRAVENOUS
  Filled 2013-12-24: qty 200

## 2013-12-24 MED ORDER — METRONIDAZOLE IN NACL 5-0.79 MG/ML-% IV SOLN
500.0000 mg | Freq: Three times a day (TID) | INTRAVENOUS | Status: DC
  Filled 2013-12-24: qty 100

## 2013-12-24 MED ORDER — METRONIDAZOLE IN NACL 5-0.79 MG/ML-% IV SOLN
500.0000 mg | Freq: Three times a day (TID) | INTRAVENOUS | Status: DC
  Administered 2013-12-24 – 2013-12-27 (×11): 500 mg via INTRAVENOUS
  Filled 2013-12-24 (×12): qty 100

## 2013-12-24 MED ORDER — ONDANSETRON HCL 4 MG/2ML IJ SOLN: 4.0000 mg | Freq: Four times a day (QID) | INTRAMUSCULAR | Status: DC | PRN

## 2013-12-24 MED ORDER — CIPROFLOXACIN IN D5W 400 MG/200ML IV SOLN
400.0000 mg | Freq: Once | INTRAVENOUS | Status: DC
  Filled 2013-12-24: qty 200

## 2013-12-24 MED ORDER — ONDANSETRON HCL 4 MG PO TABS: 4.0000 mg | ORAL_TABLET | Freq: Four times a day (QID) | ORAL | Status: DC | PRN

## 2013-12-24 MED ORDER — MORPHINE SULFATE 4 MG/ML IJ SOLN
6.0000 mg | Freq: Once | INTRAMUSCULAR | Status: AC
  Administered 2013-12-24: 6 mg via INTRAVENOUS
  Filled 2013-12-24: qty 2

## 2013-12-24 NOTE — Consult Note (Signed)
Reason for Consult:abdominal pain/diverticulitis Referring Physician: Dr. Lemar Livings  Kelli [name] is an 73 y.o. female.  HPI: the patient is a 73 year old female with a 24-hour history of mid infraumbilical abdominal pain. She stated this slowly progressed over the last 24 hours and became more upper, midline, and States that this is more sore in nature. She does state she's had some looser bowel movements earlier today but has not had diarrhea.  The patient's most recent colonoscopy was 7 years ago and was normal.  CT scan as per the ER reveals diverticulitis with some microperforation and  Small amount of free air in the abdomen.  Past Medical History  Diagnosis Date  . Hypertension   . NSTEMI (non-ST elevated myocardial infarction) 02/2013    demand ischemia in setting of AFib with RVR; ETT-Myoview 03/11/13: No ischemia or scar, EF 70%.  . Atrial fibrillation 02/2013    paroxsymal; c/b Type 2 NSTEMI; CHADS2-VASc=2 (age, sex - patient had come off anti-HTN meds in past); Eliquis started;  Echocardiogram 03/10/13: EF 00-76%, grade 1 diastolic dysfunction, mild MR, mild LAE    Past Surgical History  Procedure Laterality Date  . Eye surgery    . Cataract extraction, bilateral    . Appendectomy    . Tonsillectomy      History reviewed. No pertinent family history.  Social History:  reports that she has never smoked. She has never used smokeless tobacco. She reports that she does not drink alcohol or use illicit drugs.  Allergies:  Allergies  Allergen Reactions  . Augmentin [Amoxicillin-Pot Clavulanate] Itching and Rash    Medications: I have reviewed the patient's current medications.  Results for orders placed during the hospital encounter of 12/23/13 (from the past 48 hour(s))  COMPREHENSIVE METABOLIC PANEL     Status: Abnormal   Collection Time    12/23/13  6:57 PM      Result Value Ref Range   Sodium 136 (*) 137 - 147 mEq/L   Potassium 4.4  3.7 - 5.3 mEq/L   Chloride 97  96 -  112 mEq/L   CO2 24  19 - 32 mEq/L   Glucose, Bld 98  70 - 99 mg/dL   BUN 23  6 - 23 mg/dL   Creatinine, Ser 0.93  0.50 - 1.10 mg/dL   Calcium 9.0  8.4 - 10.5 mg/dL   Total Protein 7.4  6.0 - 8.3 g/dL   Albumin 3.5  3.5 - 5.2 g/dL   AST 26  0 - 37 U/L   ALT 20  0 - 35 U/L   Alkaline Phosphatase 57  39 - 117 U/L   Total Bilirubin 0.8  0.3 - 1.2 mg/dL   GFR calc non Af Amer 60 (*) >90 mL/min   GFR calc Af Amer 69 (*) >90 mL/min   Comment: (NOTE)     The eGFR has been calculated using the CKD EPI equation.     This calculation has not been validated in all clinical situations.     eGFR's persistently <90 mL/min signify possible Chronic Kidney     Disease.  CBC WITH DIFFERENTIAL     Status: Abnormal   Collection Time    12/23/13  6:57 PM      Result Value Ref Range   WBC 20.1 (*) 4.0 - 10.5 K/uL   RBC 4.12  3.87 - 5.11 MIL/uL   Hemoglobin 13.1  12.0 - 15.0 g/dL   HCT 37.3  36.0 - 46.0 %   MCV  90.5  78.0 - 100.0 fL   MCH 31.8  26.0 - 34.0 pg   MCHC 35.1  30.0 - 36.0 g/dL   RDW 13.9  11.5 - 15.5 %   Platelets 237  150 - 400 K/uL   Neutrophils Relative % 78 (*) 43 - 77 %   Neutro Abs 15.7 (*) 1.7 - 7.7 K/uL   Lymphocytes Relative 13  12 - 46 %   Lymphs Abs 2.7  0.7 - 4.0 K/uL   Monocytes Relative 8  3 - 12 %   Monocytes Absolute 1.5 (*) 0.1 - 1.0 K/uL   Eosinophils Relative 1  0 - 5 %   Eosinophils Absolute 0.1  0.0 - 0.7 K/uL   Basophils Relative 0  0 - 1 %   Basophils Absolute 0.0  0.0 - 0.1 K/uL  URINALYSIS, ROUTINE W REFLEX MICROSCOPIC     Status: Abnormal   Collection Time    12/23/13  9:59 PM      Result Value Ref Range   Color, Urine AMBER (*) YELLOW   Comment: BIOCHEMICALS MAY BE AFFECTED BY COLOR   APPearance CLOUDY (*) CLEAR   Specific Gravity, Urine 1.030  1.005 - 1.030   pH 5.5  5.0 - 8.0   Glucose, UA NEGATIVE  NEGATIVE mg/dL   Hgb urine dipstick LARGE (*) NEGATIVE   Bilirubin Urine NEGATIVE  NEGATIVE   Ketones, ur >80 (*) NEGATIVE mg/dL   Protein, ur 30  (*) NEGATIVE mg/dL   Urobilinogen, UA 0.2  0.0 - 1.0 mg/dL   Nitrite NEGATIVE  NEGATIVE   Leukocytes, UA MODERATE (*) NEGATIVE  URINE MICROSCOPIC-ADD ON     Status: Abnormal   Collection Time    12/23/13  9:59 PM      Result Value Ref Range   Squamous Epithelial / LPF FEW (*) RARE   WBC, UA 11-20  <3 WBC/hpf   RBC / HPF 11-20  <3 RBC/hpf   Bacteria, UA RARE  RARE   Urine-Other MUCOUS PRESENT    I-STAT CG4 LACTIC ACID, ED     Status: None   Collection Time    12/23/13 10:22 PM      Result Value Ref Range   Lactic Acid, Venous 0.81  0.5 - 2.2 mmol/L    Ct Abdomen Pelvis W Contrast  12/23/2013   CLINICAL DATA:  Low abdominal pain radiating to the upper abdomen. Fever and constipation.  EXAM: CT ABDOMEN AND PELVIS WITH CONTRAST  TECHNIQUE: Multidetector CT imaging of the abdomen and pelvis was performed using the standard protocol following bolus administration of intravenous contrast.  CONTRAST:  26m OMNIPAQUE IOHEXOL 300 MG/ML  SOLN  COMPARISON:  None.  FINDINGS: Motion artifact limits visualization of the lung bases. There appears to be dependent atelectasis and scarring present with small pneumatoceles, likely emphysema. Calcification in the mitral valve annulus. Small esophageal hiatal hernia.  Small collections of free intra-abdominal air are demonstrated in the upper abdomen and upper mesenteric. I see no indication of recent surgery. This indicates bowel perforation. No free contrast material is demonstrated. There is a focal thickening of wall of pylorus of the stomach which may indicate peptic ulcer disease. In addition, there is evidence of diverticulitis in the sigmoid colon with infiltration in the pericolonic fat and with gas in the pericolonic mesenteric. This is likely to be the source of perforation.  Cholelithiasis. Aortic calcification without aneurysm. Liver, spleen, adrenal glands, kidneys, inferior vena cava, and retroperitoneal lymph nodes are unremarkable.  Pelvis:  Bladder  is decompressed. The uterus and ovaries do not appear enlarged. No significant pelvic lymphadenopathy. Degenerative changes in the lumbar spine. No destructive bone lesions appreciated.  IMPRESSION: Perforated diverticulitis with gas and inflammatory infiltration around the sigmoid colon. In addition, there is free intra-abdominal air extending to the upper abdomen. Nonspecific thickening of the pylorus of the stomach may also indicate peptic ulcer disease. Cholelithiasis.  Results were telephoned to Dr. Cathi Roan in the emergency department at 2326 hours on 12/23/2013.   Electronically Signed   By: Lucienne Capers M.D.   On: 12/23/2013 23:28    Review of Systems  Constitutional: Negative for weight loss.  HENT: Negative for ear discharge, ear pain, hearing loss and tinnitus.   Eyes: Negative for blurred vision, double vision, photophobia and pain.  Respiratory: Negative for cough, sputum production and shortness of breath.   Cardiovascular: Negative for chest pain.  Gastrointestinal: Positive for abdominal pain. Negative for nausea, vomiting and diarrhea.  Genitourinary: Negative for dysuria, urgency, frequency and flank pain.  Musculoskeletal: Negative for back pain, falls, joint pain, myalgias and neck pain.  Neurological: Negative for dizziness, tingling, sensory change, focal weakness, loss of consciousness and headaches.  Endo/Heme/Allergies: Does not bruise/bleed easily.  Psychiatric/Behavioral: Negative for depression, memory loss and substance abuse. The patient is not nervous/anxious.    Blood pressure 131/53, pulse 89, temperature 98.1 F (36.7 C), temperature source Oral, resp. rate 18, height 5' 3"  (1.6 m), weight 156 lb 9.6 oz (71.033 kg), SpO2 97.00%. Physical Exam  Vitals reviewed. Constitutional: She is oriented to person, place, and time. She appears well-developed and well-nourished. She is cooperative. No distress. Cervical collar and nasal cannula in place.  HENT:  Head:  Normocephalic and atraumatic. Head is without raccoon's eyes, without Battle's sign, without abrasion, without contusion and without laceration.  Right Ear: Hearing, tympanic membrane, external ear and ear canal normal. No lacerations. No drainage or tenderness. No foreign bodies. Tympanic membrane is not perforated. No hemotympanum.  Left Ear: Hearing, tympanic membrane, external ear and ear canal normal. No lacerations. No drainage or tenderness. No foreign bodies. Tympanic membrane is not perforated. No hemotympanum.  Nose: Nose normal. No nose lacerations, sinus tenderness, nasal deformity or nasal septal hematoma. No epistaxis.  Mouth/Throat: Uvula is midline, oropharynx is clear and moist and mucous membranes are normal. No lacerations.  Eyes: Conjunctivae, EOM and lids are normal. Pupils are equal, round, and reactive to light. No scleral icterus.  Neck: Trachea normal. No JVD present. No spinous process tenderness and no muscular tenderness present. Carotid bruit is not present. No thyromegaly present.  Cardiovascular: Normal rate, regular rhythm, normal heart sounds, intact distal pulses and normal pulses.   Respiratory: Effort normal and breath sounds normal. No respiratory distress. She exhibits no tenderness, no bony tenderness, no laceration and no crepitus.  GI: Soft. Normal appearance and bowel sounds are normal. She exhibits no distension and no mass. There is tenderness (midline infraumb). There is no rigidity, no rebound, no guarding and no CVA tenderness.  Musculoskeletal: Normal range of motion. She exhibits no edema and no tenderness.  Lymphadenopathy:    She has no cervical adenopathy.  Neurological: She is alert and oriented to person, place, and time. She has normal strength. No cranial nerve deficit or sensory deficit. GCS eye subscore is 4. GCS verbal subscore is 5. GCS motor subscore is 6.  Skin: Skin is warm, dry and intact. She is not diaphoretic.  Psychiatric: She has a  normal mood and affect.  Her speech is normal and behavior is normal.    Assessment/Plan: 73 year old female with multiple medical problems to include A. Fib and a previous MI in [place] of 2014 with a current  bout of diverticulitis.  1. Keep patient n.p.o. With IV fluids 2. Recommend IV Cipro Flagyl at this time. 3. Discussed with the patient that it may be possible she can avoid surgery at this time. I also discussed with the patient that should the pain and/or infection get worse she could require a colostomy and Hartman's procedure. The patient is familiar with this as her husband required this in the past. 4. This time there is no plans for urgent or emergent surgery. 5. This time I would DC her anticoagulant.  Ralene Ok 12/24/2013, 12:34 AM

## 2013-12-24 NOTE — ED Provider Notes (Signed)
CSN: [id number]     Arrival date & time 12/23/13  1846 History   First MD Initiated Contact with Patient 12/23/13 2157     Chief Complaint  Patient presents with  . Abdominal Pain     (Consider location/radiation/quality/duration/timing/severity/associated sxs/prior Treatment) HPI Patient presents to the emergency department with worsening abdominal pain, over the last 4 days.  The patient, states, that her pain, increased yesterday.  She was seen at the urgent care earlier today and sent to the emergency department for further evaluation.  Patient, states, that her abdominal pain, has spread from the lower part of her abdomen to the bilateral upper abdominal region.  Patient, states, that she's had no vomiting, but nausea and one episode of diarrhea.  The patient denies chest pain, shortness of breath, headache, blurred vision, back pain, dysuria, fever, weakness, dizziness, rash or syncope.  The patient, states she did not take any medications prior to arrival.  Patient, states, that palpation makes the pain, worse Past Medical History  Diagnosis Date  . Hypertension   . NSTEMI (non-ST elevated myocardial infarction) 02/2013    demand ischemia in setting of AFib with RVR; ETT-Myoview 03/11/13: No ischemia or scar, EF 70%.  . Atrial fibrillation 02/2013    paroxsymal; c/b Type 2 NSTEMI; CHADS2-VASc=2 (age, sex - patient had come off anti-HTN meds in past); Eliquis started;  Echocardiogram 03/10/13: EF 33-82%, grade 1 diastolic dysfunction, mild MR, mild LAE   Past Surgical History  Procedure Laterality Date  . Eye surgery    . Cataract extraction, bilateral    . Appendectomy    . Tonsillectomy     History reviewed. No pertinent family history. History  Substance Use Topics  . Smoking status: Never Smoker   . Smokeless tobacco: Never Used  . Alcohol Use: No   OB History   Grav Para Term Preterm Abortions TAB SAB Ect Mult Living                 Review of Systems  All other systems  negative except as documented in the HPI. All pertinent positives and negatives as reviewed in the HPI.  Allergies  Augmentin  Home Medications   Current Outpatient Rx  Name  Route  Sig  Dispense  Refill  . acetaminophen (TYLENOL) 500 MG tablet   Oral   Take 500 mg by mouth daily as needed for pain.         [name] apixaban (ELIQUIS) 5 MG TABS tablet   Oral   Take 1 tablet (5 mg total) by mouth 2 (two) times daily.   180 tablet   3   . Calcium Carb-Cholecalciferol (CALCIUM + D3) 600-200 MG-UNIT TABS   Oral   Take 1 tablet by mouth daily.         . Cholecalciferol (VITAMIN D) 400 UNITS capsule   Oral   Take 400 Units by mouth daily.         [name] docusate sodium (COLACE) 100 MG capsule   Oral   Take 100 mg by mouth at bedtime.         . fish oil-omega-3 fatty acids 1000 MG capsule   Oral   Take 1 g by mouth daily.         . metoprolol (LOPRESSOR) 50 MG tablet   Oral   Take 0.5 tablets (25 mg total) by mouth 2 (two) times daily.   180 tablet   3   . mometasone (NASONEX) 50 MCG/ACT nasal spray  Nasal   Place 2 sprays into the nose daily as needed (for allergies).         . Multiple Vitamin (MULTIVITAMIN) capsule   Oral   Take 1 capsule by mouth daily.         . vitamin C (ASCORBIC ACID) 500 MG tablet   Oral   Take 500 mg by mouth daily.          BP 131/53  Pulse 89  Temp(Src) 98.1 F (36.7 C) (Oral)  Resp 18  Ht 5\' 3"  (1.6 m)  Wt 156 lb 9.6 oz (71.033 kg)  BMI 27.75 kg/m2  SpO2 97% Physical Exam  Nursing note and vitals reviewed. Constitutional: She is oriented to person, place, and time. She appears well-developed and well-nourished. No distress.  HENT:  Head: Normocephalic and atraumatic.  Mouth/Throat: Oropharynx is clear and moist.  Eyes: Pupils are equal, round, and reactive to light.  Neck: Normal range of motion. Neck supple.  Cardiovascular: Normal rate, regular rhythm and normal heart sounds.  Exam reveals no gallop and no friction  rub.   No murmur heard. Pulmonary/Chest: Effort normal and breath sounds normal.  Abdominal: Normal appearance and bowel sounds are normal. There is tenderness in the right upper quadrant, right lower quadrant, epigastric area, suprapubic area, left upper quadrant and left lower quadrant. There is guarding. There is no rigidity and no rebound.    Neurological: She is alert and oriented to person, place, and time.  Skin: Skin is warm and dry.    ED Course  Procedures (including critical care time) Labs Review Labs Reviewed  COMPREHENSIVE METABOLIC PANEL - Abnormal; Notable for the following:    Sodium 136 (*)    GFR calc non Af Amer 60 (*)    GFR calc Af Amer 69 (*)    All other components within normal limits  CBC WITH DIFFERENTIAL - Abnormal; Notable for the following:    WBC 20.1 (*)    Neutrophils Relative % 78 (*)    Neutro Abs 15.7 (*)    Monocytes Absolute 1.5 (*)    All other components within normal limits  URINALYSIS, ROUTINE W REFLEX MICROSCOPIC - Abnormal; Notable for the following:    Color, Urine AMBER (*)    APPearance CLOUDY (*)    Hgb urine dipstick LARGE (*)    Ketones, ur >80 (*)    Protein, ur 30 (*)    Leukocytes, UA MODERATE (*)    All other components within normal limits  URINE MICROSCOPIC-ADD ON - Abnormal; Notable for the following:    Squamous Epithelial / LPF FEW (*)    All other components within normal limits  URINE CULTURE  I-STAT CG4 LACTIC ACID, ED   Imaging Review Ct Abdomen Pelvis W Contrast  12/23/2013   CLINICAL DATA:  Low abdominal pain radiating to the upper abdomen. Fever and constipation.  EXAM: CT ABDOMEN AND PELVIS WITH CONTRAST  TECHNIQUE: Multidetector CT imaging of the abdomen and pelvis was performed using the standard protocol following bolus administration of intravenous contrast.  CONTRAST:  42mL OMNIPAQUE IOHEXOL 300 MG/ML  SOLN  COMPARISON:  None.  FINDINGS: Motion artifact limits visualization of the lung bases. There  appears to be dependent atelectasis and scarring present with small pneumatoceles, likely emphysema. Calcification in the mitral valve annulus. Small esophageal hiatal hernia.  Small collections of free intra-abdominal air are demonstrated in the upper abdomen and upper mesenteric. I see no indication of recent surgery. This indicates bowel perforation. No  free contrast material is demonstrated. There is a focal thickening of wall of pylorus of the stomach which may indicate peptic ulcer disease. In addition, there is evidence of diverticulitis in the sigmoid colon with infiltration in the pericolonic fat and with gas in the pericolonic mesenteric. This is likely to be the source of perforation.  Cholelithiasis. Aortic calcification without aneurysm. Liver, spleen, adrenal glands, kidneys, inferior vena cava, and retroperitoneal lymph nodes are unremarkable.  Pelvis: Bladder is decompressed. The uterus and ovaries do not appear enlarged. No significant pelvic lymphadenopathy. Degenerative changes in the lumbar spine. No destructive bone lesions appreciated.  IMPRESSION: Perforated diverticulitis with gas and inflammatory infiltration around the sigmoid colon. In addition, there is free intra-abdominal air extending to the upper abdomen. Nonspecific thickening of the pylorus of the stomach may also indicate peptic ulcer disease. Cholelithiasis.  Results were telephoned to Dr. Cathi Roan in the emergency department at 2326 hours on 12/23/2013.   Electronically Signed   By: Lucienne Capers M.D.   On: 12/23/2013 23:28   I spoke with the, general surgeon on-call, who will be down to evaluate the patient also spoke with the, [facility], who will admit the patient to the hospital.  Patient, in stable here in the emergency department.  She is given the results for CT scan and laboratory testing.    Brent General, PA-C 12/24/13 [phone]

## 2013-12-24 NOTE — H&P (Signed)
[facility] History and Physical  Patient: Kelli [name]  KXF:[id number]  DOB: 30-Dec-1940  DOS: the patient was seen and examined on 12/24/2013 PCP: Horatio Pel, MD  Chief Complaint: Abdominal pain  HPI: Kelli [name] is a 73 y.o. female with Past medical history of hypertension, A. fib. The patient presented with complaints of lower abdominal pain. She mentions 2 days ago she was at her baseline and did not have any significant complaint. Day befor Yesterday she started having complaints of lower abdominal pain in the night. She also had fever throughout the night she couldn't have any improvement in her abdomen and had diaphoresis and acid reflux. When she woke up her pain improved, later on it came back and this time this was also an upper abdominal region. The pain she described it as a combination of cramps and a dull ache. She denies any active bleeding but had 2 loose bowel movement earlier in the morning. She mentions she is passing gas. She denies any burning urination. She denies any prior episode of similarity. She had history of cholecystectomy. Patient was evaluated by general surgery who requested admission to medicine service and they will be following the patient has a consult.  The patient is coming from home. And at her baseline Independent for most of her  ADL.  Review of Systems: as mentioned in the history of present illness.  A Comprehensive review of the other systems is negative.  Past Medical History  Diagnosis Date  . Hypertension   . NSTEMI (non-ST elevated myocardial infarction) 02/2013    demand ischemia in setting of AFib with RVR; ETT-Myoview 03/11/13: No ischemia or scar, EF 70%.  . Atrial fibrillation 02/2013    paroxsymal; c/b Type 2 NSTEMI; CHADS2-VASc=2 (age, sex - patient had come off anti-HTN meds in past); Eliquis started;  Echocardiogram 03/10/13: EF 69-67%, grade 1 diastolic dysfunction, mild MR, mild LAE   Past Surgical History   Procedure Laterality Date  . Eye surgery    . Cataract extraction, bilateral    . Appendectomy    . Tonsillectomy     Social History:  reports that she has never smoked. She has never used smokeless tobacco. She reports that she does not drink alcohol or use illicit drugs.  Allergies  Allergen Reactions  . Augmentin [Amoxicillin-Pot Clavulanate] Itching and Rash    History reviewed. No pertinent family history.  Prior to Admission medications   Medication Sig Start Date End Date Taking? Authorizing Provider  acetaminophen (TYLENOL) 500 MG tablet Take 500 mg by mouth daily as needed for pain.   Yes Historical Provider, MD  apixaban (ELIQUIS) 5 MG TABS tablet Take 1 tablet (5 mg total) by mouth 2 (two) times daily. 05/11/13  Yes Peter M Martinique, MD  Calcium Carb-Cholecalciferol (CALCIUM + D3) 600-200 MG-UNIT TABS Take 1 tablet by mouth daily.   Yes Historical Provider, MD  Cholecalciferol (VITAMIN D) 400 UNITS capsule Take 400 Units by mouth daily.   Yes Historical Provider, MD  docusate sodium (COLACE) 100 MG capsule Take 100 mg by mouth at bedtime.   Yes Historical Provider, MD  fish oil-omega-3 fatty acids 1000 MG capsule Take 1 g by mouth daily.   Yes Historical Provider, MD  metoprolol (LOPRESSOR) 50 MG tablet Take 0.5 tablets (25 mg total) by mouth 2 (two) times daily. 08/18/13  Yes Peter M Martinique, MD  mometasone (NASONEX) 50 MCG/ACT nasal spray Place 2 sprays into the nose daily as needed (for allergies).  Yes Historical Provider, MD  Multiple Vitamin (MULTIVITAMIN) capsule Take 1 capsule by mouth daily.   Yes Historical Provider, MD  vitamin C (ASCORBIC ACID) 500 MG tablet Take 500 mg by mouth daily.   Yes Historical Provider, MD    Physical Exam: Filed Vitals:   12/23/13 1855 12/23/13 2154 12/23/13 2300 12/24/13 0045  BP: 151/75 139/54 131/53 150/55  Pulse: 97 93 89 99  Temp: 99.5 F (37.5 C) 98.1 F (36.7 C)    TempSrc: Oral Oral    Resp: 16 18    Height: 5\' 3"  (1.6 m)      Weight: 71.033 kg (156 lb 9.6 oz)     SpO2: 99% 98% 97% 97%    General: Alert, Awake and Oriented to Time, Place and Person. Appear in mild distress Eyes: PERRL ENT: Oral Mucosa clear moist. Neck: No JVD Cardiovascular: S1 and S2 Present, no Murmur, Peripheral Pulses Present Respiratory: Bilateral Air entry equal and Decreased, Clear to Auscultation,  No Crackles, no wheezes Abdomen: Bowel Sound Present, Soft and diffuse mild tender, no guarding no rigidity Skin: No Rash Extremities: No Pedal edema, no calf tenderness Neurologic: Grossly Unremarkable.  Labs on Admission:  CBC:  Recent Labs Lab 12/23/13 1857  WBC 20.1*  NEUTROABS 15.7*  HGB 13.1  HCT 37.3  MCV 90.5  PLT 237    CMP     Component Value Date/Time   NA 136* 12/23/2013 1857   K 4.4 12/23/2013 1857   CL 97 12/23/2013 1857   CO2 24 12/23/2013 1857   GLUCOSE 98 12/23/2013 1857   BUN 23 12/23/2013 1857   CREATININE 0.93 12/23/2013 1857   CALCIUM 9.0 12/23/2013 1857   PROT 7.4 12/23/2013 1857   ALBUMIN 3.5 12/23/2013 1857   AST 26 12/23/2013 1857   ALT 20 12/23/2013 1857   ALKPHOS 57 12/23/2013 1857   BILITOT 0.8 12/23/2013 1857   GFRNONAA 60* 12/23/2013 1857   GFRAA 69* 12/23/2013 1857    No results found for this basename: LIPASE, AMYLASE,  in the last 168 hours No results found for this basename: AMMONIA,  in the last 168 hours  No results found for this basename: CKTOTAL, CKMB, CKMBINDEX, TROPONINI,  in the last 168 hours BNP (last 3 results) No results found for this basename: PROBNP,  in the last 8760 hours  Radiological Exams on Admission: Ct Abdomen Pelvis W Contrast  12/23/2013   CLINICAL DATA:  Low abdominal pain radiating to the upper abdomen. Fever and constipation.  EXAM: CT ABDOMEN AND PELVIS WITH CONTRAST  TECHNIQUE: Multidetector CT imaging of the abdomen and pelvis was performed using the standard protocol following bolus administration of intravenous contrast.  CONTRAST:  73mL OMNIPAQUE IOHEXOL  300 MG/ML  SOLN  COMPARISON:  None.  FINDINGS: Motion artifact limits visualization of the lung bases. There appears to be dependent atelectasis and scarring present with small pneumatoceles, likely emphysema. Calcification in the mitral valve annulus. Small esophageal hiatal hernia.  Small collections of free intra-abdominal air are demonstrated in the upper abdomen and upper mesenteric. I see no indication of recent surgery. This indicates bowel perforation. No free contrast material is demonstrated. There is a focal thickening of wall of pylorus of the stomach which may indicate peptic ulcer disease. In addition, there is evidence of diverticulitis in the sigmoid colon with infiltration in the pericolonic fat and with gas in the pericolonic mesenteric. This is likely to be the source of perforation.  Cholelithiasis. Aortic calcification without aneurysm. Liver, spleen, adrenal  glands, kidneys, inferior vena cava, and retroperitoneal lymph nodes are unremarkable.  Pelvis: Bladder is decompressed. The uterus and ovaries do not appear enlarged. No significant pelvic lymphadenopathy. Degenerative changes in the lumbar spine. No destructive bone lesions appreciated.  IMPRESSION: Perforated diverticulitis with gas and inflammatory infiltration around the sigmoid colon. In addition, there is free intra-abdominal air extending to the upper abdomen. Nonspecific thickening of the pylorus of the stomach may also indicate peptic ulcer disease. Cholelithiasis.  Results were telephoned to Dr. Cathi Roan in the emergency department at 2326 hours on 12/23/2013.   Electronically Signed   By: Lucienne Capers M.D.   On: 12/23/2013 23:28    Assessment/Plan Principal Problem:   Perforated diverticulitis Active Problems:   Atrial fibrillation   Unspecified constipation   1. Perforated diverticulitis The patient is presenting with complaints of abdominal pain. CT scan of the abdomen was performed in the ED which is positive  for free air in her abdomen, significant inflammation of sigmoid diverticulitis with colonic stranding. She appears to have a microperforation of her diverticulitis. At present general surgery recommends conservative management with antibiotics, IV fluids, n.p.o., IV pain management, IV symptom management with Zofran and Protonix. We will follow general surgery recommendation, at present she will be on IV Cipro and Flagyl per surgery Down the road she may also need a GI evaluation  2. Atrial fibrillation At present the patient appears sinus rhythm on telemetry does not have any complaints of chest pain, dizziness, shortness of breath, palpitation. As per recommendation from general surgery we will hold her Apixaban  3. Hypertension Low dose beta blocker continue  Consults: General surgery  DVT Prophylaxis: subcutaneous Heparin Nutrition: N.p.o.  Code Status: Full  Family Communication: Daughter was present at bedside, opportunity was given to ask question and all questions were answered satisfactorily at the time of interview. Disposition: Admitted to inpatient in med-surge unit.  Author: Berle Mull, MD [facility] Pager: [phone] 12/24/2013, 1:39 AM    If 7PM-7AM, please contact night-coverage [email or web address] Password [email or web address]

## 2013-12-24 NOTE — Progress Notes (Signed)
Subjective: Alert. Friendly. Minimal distress. Feels better but still having some left lower quadrant pain. No nausea or vomiting.  Afebrile. Heart rate 90. BP 113/51. No respiratory problems.  Objective: Vital signs in last 24 hours: Temp:  [98.1 F (36.7 C)-99.5 F (37.5 C)] 98.9 F (37.2 C) (04/11 0524) Pulse Rate:  [89-99] 90 (04/11 0524) Resp:  [16-18] 18 (04/11 0524) BP: (113-151)/(51-75) 113/51 mmHg (04/11 0524) SpO2:  [96 %-100 %] 96 % (04/11 0524) Weight:  [156 lb 9.6 oz (71.033 kg)-157 lb (71.215 kg)] 157 lb (71.215 kg) (04/11 0200) Last BM Date: 12/23/13  Intake/Output from previous day:   Intake/Output this shift:    General appearance: alert. Pleasant. Minimal distress. Mental status normal Resp: clear to auscultation bilaterally GI: abdomen is generally soft and nondistended. Tender with mild guarding in the left lower quadrant. No mass.  Lab Results:   Recent Labs  12/23/13 1857  WBC 20.1*  HGB 13.1  HCT 37.3  PLT 237   BMET  Recent Labs  12/23/13 1857  NA 136*  K 4.4  CL 97  CO2 24  GLUCOSE 98  BUN 23  CREATININE 0.93  CALCIUM 9.0   PT/INR No results found for this basename: LABPROT, INR,  in the last 72 hours ABG No results found for this basename: PHART, PCO2, PO2, HCO3,  in the last 72 hours  Studies/Results: Ct Abdomen Pelvis W Contrast  12/23/2013   CLINICAL DATA:  Low abdominal pain radiating to the upper abdomen. Fever and constipation.  EXAM: CT ABDOMEN AND PELVIS WITH CONTRAST  TECHNIQUE: Multidetector CT imaging of the abdomen and pelvis was performed using the standard protocol following bolus administration of intravenous contrast.  CONTRAST:  9mL OMNIPAQUE IOHEXOL 300 MG/ML  SOLN  COMPARISON:  None.  FINDINGS: Motion artifact limits visualization of the lung bases. There appears to be dependent atelectasis and scarring present with small pneumatoceles, likely emphysema. Calcification in the mitral valve annulus. Small  esophageal hiatal hernia.  Small collections of free intra-abdominal air are demonstrated in the upper abdomen and upper mesenteric. I see no indication of recent surgery. This indicates bowel perforation. No free contrast material is demonstrated. There is a focal thickening of wall of pylorus of the stomach which may indicate peptic ulcer disease. In addition, there is evidence of diverticulitis in the sigmoid colon with infiltration in the pericolonic fat and with gas in the pericolonic mesenteric. This is likely to be the source of perforation.  Cholelithiasis. Aortic calcification without aneurysm. Liver, spleen, adrenal glands, kidneys, inferior vena cava, and retroperitoneal lymph nodes are unremarkable.  Pelvis: Bladder is decompressed. The uterus and ovaries do not appear enlarged. No significant pelvic lymphadenopathy. Degenerative changes in the lumbar spine. No destructive bone lesions appreciated.  IMPRESSION: Perforated diverticulitis with gas and inflammatory infiltration around the sigmoid colon. In addition, there is free intra-abdominal air extending to the upper abdomen. Nonspecific thickening of the pylorus of the stomach may also indicate peptic ulcer disease. Cholelithiasis.  Results were telephoned to Dr. Cathi Roan in the emergency department at 2326 hours on 12/23/2013.   Electronically Signed   By: Lucienne Capers M.D.   On: 12/23/2013 23:28    Anti-infectives: Anti-infectives   Start     Dose/Rate Route Frequency Ordered Stop   12/24/13 1200  ciprofloxacin (CIPRO) IVPB 400 mg     400 mg 200 mL/hr over 60 Minutes Intravenous Every 12 hours 12/24/13 0239     12/24/13 0400  metroNIDAZOLE (FLAGYL) IVPB 500 mg  500 mg 100 mL/hr over 60 Minutes Intravenous Every 8 hours 12/24/13 0239     12/24/13 0100  ciprofloxacin (CIPRO) IVPB 400 mg  Status:  Discontinued     400 mg 200 mL/hr over 60 Minutes Intravenous Every 12 hours 12/24/13 0040 12/24/13 0240   12/24/13 0045  ciprofloxacin  (CIPRO) IVPB 400 mg  Status:  Discontinued     400 mg 200 mL/hr over 60 Minutes Intravenous  Once 12/24/13 0031 12/24/13 0041   12/24/13 0045  metroNIDAZOLE (FLAGYL) IVPB 500 mg  Status:  Discontinued     500 mg 100 mL/hr over 60 Minutes Intravenous  Once 12/24/13 0031 12/24/13 0041   12/24/13 0045  metroNIDAZOLE (FLAGYL) IVPB 500 mg  Status:  Discontinued     500 mg 100 mL/hr over 60 Minutes Intravenous Every 8 hours 12/24/13 0040 12/24/13 0241      Assessment/Plan:  Acute diverticulitis. First episode by history. Microperforation but no abscess or obstruction. For the next 24 hours, continue bowel rest and IV antibiotics Ambulate.  NSTEMI, Tylena, 2014. Atrial fibrillation-Hold apixabanfor now. Hypertension.-Continue low dose beta blocker.  Will need gastroenterology followup. She actually has an appointment with Dr. Penelope Coop on May 5 for evaluation of reflux.   LOS: 1 day    Adin Hector 12/24/2013

## 2013-12-24 NOTE — Progress Notes (Addendum)
[facility]                                                                              Patient Demographics  Kelli [name], is a 73 y.o. female, DOB - 06-May-1941, MPN:[id number]  Admit date - 12/23/2013   Admitting Physician Berle Mull, MD  Outpatient Primary MD for the patient is Horatio Pel, MD  LOS - 1   Chief Complaint  Patient presents with  . Abdominal Pain        Assessment & Plan   Abdominal pain secondary to diverticulitis with microperforation -CT scan of the abdomen:Perforated diverticulitis with gas and inflammatory infiltration around the sigmoid colon. In addition, there is free intra-abdominal air extending to the upper abdomen. -General surgery consulted and following -Continue bowel rest, IV Cipro and Flagyl, and supportive care -Will need gastroenterology followup, patient has appointment with Dr. Penelope Coop on May 5  Atrial fibrillation -Per surgery recommendation, hold the Apixaban -Currently rate and rhythm controlled -Continue  metoprolol  Hypertension -Controlled -Continue metoprolol  PUD/GERD -Continue protonix  Code Status: Full  Family Communication: Daughter at bedside  Disposition Plan: Admitted  Time Spent in minutes   30 minutes  Procedures none  Consults  surgery  DVT Prophylaxis  Heparin  Lab Results  Component Value Date   PLT 237 12/23/2013    Medications  Scheduled Meds: . ciprofloxacin  400 mg Intravenous Q12H  . heparin  5,000 Units Subcutaneous 3 times per day  . metoprolol  12.5 mg Oral BID  . metronidazole  500 mg Intravenous Q8H  . pantoprazole (PROTONIX) IV  40 mg Intravenous Q24H   Continuous Infusions: . sodium chloride 100 mL/hr at 12/24/13 0315   PRN Meds:.morphine injection, ondansetron (ZOFRAN) IV, ondansetron  Antibiotics    Anti-infectives   Start     Dose/Rate Route Frequency Ordered Stop   12/24/13 1200  ciprofloxacin (CIPRO) IVPB 400 mg     400 mg 200 mL/hr over 60 Minutes  Intravenous Every 12 hours 12/24/13 0239     12/24/13 0400  metroNIDAZOLE (FLAGYL) IVPB 500 mg     500 mg 100 mL/hr over 60 Minutes Intravenous Every 8 hours 12/24/13 0239     12/24/13 0100  ciprofloxacin (CIPRO) IVPB 400 mg  Status:  Discontinued     400 mg 200 mL/hr over 60 Minutes Intravenous Every 12 hours 12/24/13 0040 12/24/13 0240   12/24/13 0045  ciprofloxacin (CIPRO) IVPB 400 mg  Status:  Discontinued     400 mg 200 mL/hr over 60 Minutes Intravenous  Once 12/24/13 0031 12/24/13 0041   12/24/13 0045  metroNIDAZOLE (FLAGYL) IVPB 500 mg  Status:  Discontinued     500 mg 100 mL/hr over 60 Minutes Intravenous  Once 12/24/13 0031 12/24/13 0041   12/24/13 0045  metroNIDAZOLE (FLAGYL) IVPB 500 mg  Status:  Discontinued     500 mg 100 mL/hr over 60 Minutes Intravenous Every 8 hours 12/24/13 0040 12/24/13 0241        Subjective:   Kelli [name] seen and examined today.  Patient denies any nausea or vomiting. Has some abdominal pain however controlled with medication. Denies any diarrhea, is worried about constipation. Would like to  walk around.  Objective:   Filed Vitals:   12/23/13 2300 12/24/13 0045 12/24/13 0200 12/24/13 0524  BP: 131/53 150/55 136/54 113/51  Pulse: 89 99 91 90  Temp:   98.1 F (36.7 C) 98.9 F (37.2 C)  TempSrc:   Oral Oral  Resp:   16 18  Height:   5\' 3"  (1.6 m)   Weight:   71.215 kg (157 lb)   SpO2: 97% 97% 96% 96%    Wt Readings from Last 3 Encounters:  12/24/13 71.215 kg (157 lb)  08/18/13 72.576 kg (160 lb)  05/11/13 70.761 kg (156 lb)    No intake or output data in the 24 hours ending 12/24/13 0855  Exam  General: Well developed, well nourished, NAD, appears stated age  HEENT: NCAT, PERRLA, EOMI, Anicteic Sclera, mucous membranes moist.  Neck: Supple, no JVD, no masses  Cardiovascular: S1 S2 auscultated, no rubs, murmurs or gallops. Regular rate and rhythm.  Respiratory: Clear to auscultation bilaterally with equal chest  rise  Abdomen: Soft, LLQ tenderness, nondistended, + bowel sounds  Extremities: warm dry without cyanosis clubbing or edema  Neuro: AAOx3, cranial nerves grossly intact. Strength 5/5 in patient's upper and lower extremities bilaterally  Skin: Without rashes exudates or nodules  Psych: Normal affect and demeanor with intact judgement and insight   Data Review   Micro Results No results found for this or any previous visit (from the past 240 hour(s)).  Radiology Reports Ct Abdomen Pelvis W Contrast  12/23/2013   CLINICAL DATA:  Low abdominal pain radiating to the upper abdomen. Fever and constipation.  EXAM: CT ABDOMEN AND PELVIS WITH CONTRAST  TECHNIQUE: Multidetector CT imaging of the abdomen and pelvis was performed using the standard protocol following bolus administration of intravenous contrast.  CONTRAST:  23mL OMNIPAQUE IOHEXOL 300 MG/ML  SOLN  COMPARISON:  None.  FINDINGS: Motion artifact limits visualization of the lung bases. There appears to be dependent atelectasis and scarring present with small pneumatoceles, likely emphysema. Calcification in the mitral valve annulus. Small esophageal hiatal hernia.  Small collections of free intra-abdominal air are demonstrated in the upper abdomen and upper mesenteric. I see no indication of recent surgery. This indicates bowel perforation. No free contrast material is demonstrated. There is a focal thickening of wall of pylorus of the stomach which may indicate peptic ulcer disease. In addition, there is evidence of diverticulitis in the sigmoid colon with infiltration in the pericolonic fat and with gas in the pericolonic mesenteric. This is likely to be the source of perforation.  Cholelithiasis. Aortic calcification without aneurysm. Liver, spleen, adrenal glands, kidneys, inferior vena cava, and retroperitoneal lymph nodes are unremarkable.  Pelvis: Bladder is decompressed. The uterus and ovaries do not appear enlarged. No significant pelvic  lymphadenopathy. Degenerative changes in the lumbar spine. No destructive bone lesions appreciated.  IMPRESSION: Perforated diverticulitis with gas and inflammatory infiltration around the sigmoid colon. In addition, there is free intra-abdominal air extending to the upper abdomen. Nonspecific thickening of the pylorus of the stomach may also indicate peptic ulcer disease. Cholelithiasis.  Results were telephoned to Dr. Cathi Roan in the emergency department at 2326 hours on 12/23/2013.   Electronically Signed   By: Lucienne Capers M.D.   On: 12/23/2013 23:28    CBC  Recent Labs Lab 12/23/13 1857  WBC 20.1*  HGB 13.1  HCT 37.3  PLT 237  MCV 90.5  MCH 31.8  MCHC 35.1  RDW 13.9  LYMPHSABS 2.7  MONOABS 1.5*  EOSABS 0.1  BASOSABS 0.0    Chemistries   Recent Labs Lab 12/23/13 1857  NA 136*  K 4.4  CL 97  CO2 24  GLUCOSE 98  BUN 23  CREATININE 0.93  CALCIUM 9.0  AST 26  ALT 20  ALKPHOS 57  BILITOT 0.8   ------------------------------------------------------------------------------------------------------------------ estimated creatinine clearance is 50.9 ml/min (by C-G formula based on Cr of 0.93). ------------------------------------------------------------------------------------------------------------------ No results found for this basename: HGBA1C,  in the last 72 hours ------------------------------------------------------------------------------------------------------------------ No results found for this basename: CHOL, HDL, LDLCALC, TRIG, CHOLHDL, LDLDIRECT,  in the last 72 hours ------------------------------------------------------------------------------------------------------------------ No results found for this basename: TSH, T4TOTAL, FREET3, T3FREE, THYROIDAB,  in the last 72 hours ------------------------------------------------------------------------------------------------------------------ No results found for this basename: VITAMINB12, FOLATE,  FERRITIN, TIBC, IRON, RETICCTPCT,  in the last 72 hours  Coagulation profile No results found for this basename: INR, PROTIME,  in the last 168 hours  No results found for this basename: DDIMER,  in the last 72 hours  Cardiac Enzymes No results found for this basename: CK, CKMB, TROPONINI, MYOGLOBIN,  in the last 168 hours ------------------------------------------------------------------------------------------------------------------ No components found with this basename: POCBNP,     Mykel Mohl D.O. on 12/24/2013 at 8:55 AM  Between 7am to 7pm - Pager - [phone]  After 7pm go to [email or web address] - password [email or web address]  And look for the night coverage person covering for me after hours  [facility] Office  [phone]

## 2013-12-25 LAB — URINE CULTURE: Colony Count: 50000

## 2013-12-25 LAB — BASIC METABOLIC PANEL
BUN: 17 mg/dL (ref 6–23)
CO2: 21 mEq/L (ref 19–32)
CREATININE: 0.85 mg/dL (ref 0.50–1.10)
Calcium: 8.1 mg/dL — ABNORMAL LOW (ref 8.4–10.5)
Chloride: 103 mEq/L (ref 96–112)
GFR calc non Af Amer: 66 mL/min — ABNORMAL LOW (ref >90)
GFR, EST AFRICAN AMERICAN: 77 mL/min — AB (ref >90)
Glucose, Bld: 75 mg/dL (ref 70–99)
Potassium: 4.3 mEq/L (ref 3.7–5.3)
Sodium: 137 mEq/L (ref 137–147)

## 2013-12-25 LAB — CBC
HCT: 30.7 % — ABNORMAL LOW (ref 36.0–46.0)
Hemoglobin: 10.3 g/dL — ABNORMAL LOW (ref 12.0–15.0)
MCH: 30.7 pg (ref 26.0–34.0)
MCHC: 33.6 g/dL (ref 30.0–36.0)
MCV: 91.6 fL (ref 78.0–100.0)
Platelets: 204 10*3/uL (ref 150–400)
RBC: 3.35 MIL/uL — ABNORMAL LOW (ref 3.87–5.11)
RDW: 14 % (ref 11.5–15.5)
WBC: 17.8 10*3/uL — AB (ref 4.0–10.5)

## 2013-12-25 MED ORDER — HYDROCODONE-ACETAMINOPHEN 5-325 MG PO TABS
1.0000 | ORAL_TABLET | Freq: Once | ORAL | Status: AC
  Administered 2013-12-25: 1 via ORAL
  Filled 2013-12-25: qty 1

## 2013-12-25 NOTE — ED Provider Notes (Signed)
Medical screening examination/treatment/procedure(s) were performed by non-physician practitioner and as supervising physician I was immediately available for consultation/collaboration.   EKG Interpretation None        Elyn Peers, MD 12/25/13 [phone]

## 2013-12-25 NOTE — Progress Notes (Signed)
Subjective: Subjectively she feels much better. Pain is less. No nausea. A little bit thirsty. Passing flatus but no stool. No fever or chills.  WBC 17,800. Hemoglobin 10.3.  Objective: Vital signs in last 24 hours: Temp:  [98.4 F (36.9 C)-98.7 F (37.1 C)] 98.4 F (36.9 C) (04/12 0529) Pulse Rate:  [80-84] 80 (04/12 0529) Resp:  [16-20] 20 (04/12 0529) BP: (112-131)/(43-55) 112/45 mmHg (04/12 0529) SpO2:  [94 %-98 %] 95 % (04/12 0529) Last BM Date: 12/23/13  Intake/Output from previous day: 04/11 0701 - 04/12 0700 In: 3085 [P.O.:10; I.V.:2675; IV Piggyback:400] Out: 400 [Urine:400] Intake/Output this shift:    General appearance: alert. Cooperative. Mental status normal. In no distress. Making jokes. Family and rhythm. GI: abdomen is soft and much less tender. Not distended. No mass.  Lab Results:   Recent Labs  12/24/13 0750 12/25/13 0515  WBC 18.8* 17.8*  HGB 11.7* 10.3*  HCT 35.1* 30.7*  PLT 224 204   BMET  Recent Labs  12/24/13 0750 12/25/13 0515  NA 137 137  K 4.5 4.3  CL 98 103  CO2 25 21  GLUCOSE 77 75  BUN 17 17  CREATININE 0.88 0.85  CALCIUM 8.5 8.1*   PT/INR  Recent Labs  12/24/13 0750  LABPROT 15.2  INR 1.23   ABG No results found for this basename: PHART, PCO2, PO2, HCO3,  in the last 72 hours  Studies/Results: Ct Abdomen Pelvis W Contrast  12/23/2013   CLINICAL DATA:  Low abdominal pain radiating to the upper abdomen. Fever and constipation.  EXAM: CT ABDOMEN AND PELVIS WITH CONTRAST  TECHNIQUE: Multidetector CT imaging of the abdomen and pelvis was performed using the standard protocol following bolus administration of intravenous contrast.  CONTRAST:  32mL OMNIPAQUE IOHEXOL 300 MG/ML  SOLN  COMPARISON:  None.  FINDINGS: Motion artifact limits visualization of the lung bases. There appears to be dependent atelectasis and scarring present with small pneumatoceles, likely emphysema. Calcification in the mitral valve annulus. Small  esophageal hiatal hernia.  Small collections of free intra-abdominal air are demonstrated in the upper abdomen and upper mesenteric. I see no indication of recent surgery. This indicates bowel perforation. No free contrast material is demonstrated. There is a focal thickening of wall of pylorus of the stomach which may indicate peptic ulcer disease. In addition, there is evidence of diverticulitis in the sigmoid colon with infiltration in the pericolonic fat and with gas in the pericolonic mesenteric. This is likely to be the source of perforation.  Cholelithiasis. Aortic calcification without aneurysm. Liver, spleen, adrenal glands, kidneys, inferior vena cava, and retroperitoneal lymph nodes are unremarkable.  Pelvis: Bladder is decompressed. The uterus and ovaries do not appear enlarged. No significant pelvic lymphadenopathy. Degenerative changes in the lumbar spine. No destructive bone lesions appreciated.  IMPRESSION: Perforated diverticulitis with gas and inflammatory infiltration around the sigmoid colon. In addition, there is free intra-abdominal air extending to the upper abdomen. Nonspecific thickening of the pylorus of the stomach may also indicate peptic ulcer disease. Cholelithiasis.  Results were telephoned to Dr. Cathi Roan in the emergency department at 2326 hours on 12/23/2013.   Electronically Signed   By: Lucienne Capers M.D.   On: 12/23/2013 23:28    Anti-infectives: Anti-infectives   Start     Dose/Rate Route Frequency Ordered Stop   12/24/13 1200  ciprofloxacin (CIPRO) IVPB 400 mg     400 mg 200 mL/hr over 60 Minutes Intravenous Every 12 hours 12/24/13 0239     12/24/13 0400  metroNIDAZOLE (FLAGYL) IVPB 500 mg     500 mg 100 mL/hr over 60 Minutes Intravenous Every 8 hours 12/24/13 0239     12/24/13 0100  ciprofloxacin (CIPRO) IVPB 400 mg  Status:  Discontinued     400 mg 200 mL/hr over 60 Minutes Intravenous Every 12 hours 12/24/13 0040 12/24/13 0240   12/24/13 0045  ciprofloxacin  (CIPRO) IVPB 400 mg  Status:  Discontinued     400 mg 200 mL/hr over 60 Minutes Intravenous  Once 12/24/13 0031 12/24/13 0041   12/24/13 0045  metroNIDAZOLE (FLAGYL) IVPB 500 mg  Status:  Discontinued     500 mg 100 mL/hr over 60 Minutes Intravenous  Once 12/24/13 0031 12/24/13 0041   12/24/13 0045  metroNIDAZOLE (FLAGYL) IVPB 500 mg  Status:  Discontinued     500 mg 100 mL/hr over 60 Minutes Intravenous Every 8 hours 12/24/13 0040 12/24/13 0241      Assessment/Plan:  Acute diverticulitis. First episode by history. Microperforation but no abscess or obstruction. Although WBC still elevated, she is otherwise clinically improving Start clear liquid diet  Ambulate.   NSTEMI, [place], 2014.  Atrial fibrillation-Hold apixaban for now. Possibly restart tomorrow, if she continues to improve.  Hypertension.-Continue low dose beta blocker.   Will need gastroenterology followup. She actually has an appointment with Dr. Penelope Coop on May 5 for evaluation of reflux.   Edsel Petrin. Dalbert Batman, M.D., [facility] General and Minimally invasive Surgery Breast and Colorectal Surgery Office:   [phone]    LOS: 2 days    Adin Hector 12/25/2013

## 2013-12-25 NOTE — Progress Notes (Signed)
[facility]                                                                              Patient Demographics  Kelli [name], is a 73 y.o. female, DOB - 26-Aug-1941, YIR:[id number]  Admit date - 12/23/2013   Admitting Physician Berle Mull, MD  Outpatient Primary MD for the patient is Horatio Pel, MD  LOS - 2   Chief Complaint  Patient presents with  . Abdominal Pain        Assessment & Plan   Abdominal pain secondary to diverticulitis with microperforation -CT scan of the abdomen:Perforated diverticulitis with gas and inflammatory infiltration around the sigmoid colon. In addition, there is free intra-abdominal air extending to the upper abdomen. -General surgery consulted and following -Continue bowel rest, IV Cipro and Flagyl, and supportive care -Will need gastroenterology followup, patient has appointment with Dr. Penelope Coop on May 5  Atrial fibrillation -Per surgery recommendation, hold the Apixaban -Currently rate and rhythm controlled -Continue  metoprolol  Hypertension -Controlled -Continue metoprolol  PUD/GERD -Continue protonix  Code Status: Full  Family Communication:  Niece at bedside  Disposition Plan: Admitted  Time Spent in minutes   30 minutes  Procedures none  Consults  surgery  DVT Prophylaxis  Heparin  Lab Results  Component Value Date   PLT 204 12/25/2013    Medications  Scheduled Meds: . ciprofloxacin  400 mg Intravenous Q12H  . heparin  5,000 Units Subcutaneous 3 times per day  . metoprolol  12.5 mg Oral BID  . metronidazole  500 mg Intravenous Q8H  . pantoprazole (PROTONIX) IV  40 mg Intravenous Q24H   Continuous Infusions: . sodium chloride 100 mL/hr at 12/25/13 0345   PRN Meds:.morphine injection, ondansetron (ZOFRAN) IV, ondansetron  Antibiotics    Anti-infectives   Start     Dose/Rate Route Frequency Ordered Stop   12/24/13 1200  ciprofloxacin (CIPRO) IVPB 400 mg     400 mg 200 mL/hr over 60 Minutes  Intravenous Every 12 hours 12/24/13 0239     12/24/13 0400  metroNIDAZOLE (FLAGYL) IVPB 500 mg     500 mg 100 mL/hr over 60 Minutes Intravenous Every 8 hours 12/24/13 0239     12/24/13 0100  ciprofloxacin (CIPRO) IVPB 400 mg  Status:  Discontinued     400 mg 200 mL/hr over 60 Minutes Intravenous Every 12 hours 12/24/13 0040 12/24/13 0240   12/24/13 0045  ciprofloxacin (CIPRO) IVPB 400 mg  Status:  Discontinued     400 mg 200 mL/hr over 60 Minutes Intravenous  Once 12/24/13 0031 12/24/13 0041   12/24/13 0045  metroNIDAZOLE (FLAGYL) IVPB 500 mg  Status:  Discontinued     500 mg 100 mL/hr over 60 Minutes Intravenous  Once 12/24/13 0031 12/24/13 0041   12/24/13 0045  metroNIDAZOLE (FLAGYL) IVPB 500 mg  Status:  Discontinued     500 mg 100 mL/hr over 60 Minutes Intravenous Every 8 hours 12/24/13 0040 12/24/13 0241        Subjective:   Udell Donati seen and examined today.  Patient denies any nausea or vomiting, has passed some gas. Feels some pressure-like sensation in her lower abdomen.   Objective:  Filed Vitals:   12/24/13 0524 12/24/13 1329 12/24/13 2155 12/25/13 0529  BP: 113/51 131/55 115/43 112/45  Pulse: 90 84 82 80  Temp: 98.9 F (37.2 C) 98.7 F (37.1 C) 98.4 F (36.9 C) 98.4 F (36.9 C)  TempSrc: Oral Oral Oral Oral  Resp: 18 19 16 20   Height:      Weight:      SpO2: 96% 98% 94% 95%    Wt Readings from Last 3 Encounters:  12/24/13 71.215 kg (157 lb)  08/18/13 72.576 kg (160 lb)  05/11/13 70.761 kg (156 lb)     Intake/Output Summary (Last 24 hours) at 12/25/13 1113 Last data filed at 12/25/13 0600  Gross per 24 hour  Intake   3085 ml  Output    400 ml  Net   2685 ml    Exam  General: Well developed, well nourished, NAD, appears stated age  HEENT: NCAT, PERRLA, EOMI, Anicteic Sclera, mucous membranes moist.  Neck: Supple, no JVD, no masses  Cardiovascular: S1 S2 auscultated, no rubs, murmurs or gallops. Regular rate and rhythm.  Respiratory:  Clear to auscultation bilaterally with equal chest rise  Abdomen: Soft, LLQ tenderness, nondistended, + bowel sounds  Extremities: warm dry without cyanosis clubbing or edema  Neuro: AAOx3, no focal deficits   Skin: Without rashes exudates or nodules  Psych: Normal affect and demeanor with intact judgement and insight   Data Review   Micro Results Recent Results (from the past 240 hour(s))  URINE CULTURE     Status: None   Collection Time    12/23/13 10:00 PM      Result Value Ref Range Status   Specimen Description URINE, CLEAN CATCH   Final   Special Requests NONE   Final   Culture  Setup Time     Final   Value: 12/23/2013 22:45     Performed at [facility]     Final   Value: 50,000 COLONIES/ML     Performed at [facility]   Culture     Final   Value: Multiple bacterial morphotypes present, none predominant. Suggest appropriate recollection if clinically indicated.     Performed at [facility]   Report Status 12/25/2013 FINAL   Final    Radiology Reports Ct Abdomen Pelvis W Contrast  12/23/2013   CLINICAL DATA:  Low abdominal pain radiating to the upper abdomen. Fever and constipation.  EXAM: CT ABDOMEN AND PELVIS WITH CONTRAST  TECHNIQUE: Multidetector CT imaging of the abdomen and pelvis was performed using the standard protocol following bolus administration of intravenous contrast.  CONTRAST:  75mL OMNIPAQUE IOHEXOL 300 MG/ML  SOLN  COMPARISON:  None.  FINDINGS: Motion artifact limits visualization of the lung bases. There appears to be dependent atelectasis and scarring present with small pneumatoceles, likely emphysema. Calcification in the mitral valve annulus. Small esophageal hiatal hernia.  Small collections of free intra-abdominal air are demonstrated in the upper abdomen and upper mesenteric. I see no indication of recent surgery. This indicates bowel perforation. No free contrast material is demonstrated. There is a focal  thickening of wall of pylorus of the stomach which may indicate peptic ulcer disease. In addition, there is evidence of diverticulitis in the sigmoid colon with infiltration in the pericolonic fat and with gas in the pericolonic mesenteric. This is likely to be the source of perforation.  Cholelithiasis. Aortic calcification without aneurysm. Liver, spleen, adrenal glands, kidneys, inferior vena cava, and retroperitoneal lymph nodes  are unremarkable.  Pelvis: Bladder is decompressed. The uterus and ovaries do not appear enlarged. No significant pelvic lymphadenopathy. Degenerative changes in the lumbar spine. No destructive bone lesions appreciated.  IMPRESSION: Perforated diverticulitis with gas and inflammatory infiltration around the sigmoid colon. In addition, there is free intra-abdominal air extending to the upper abdomen. Nonspecific thickening of the pylorus of the stomach may also indicate peptic ulcer disease. Cholelithiasis.  Results were telephoned to Dr. Cathi Roan in the emergency department at 2326 hours on 12/23/2013.   Electronically Signed   By: Lucienne Capers M.D.   On: 12/23/2013 23:28    CBC  Recent Labs Lab 12/23/13 1857 12/24/13 0750 12/25/13 0515  WBC 20.1* 18.8* 17.8*  HGB 13.1 11.7* 10.3*  HCT 37.3 35.1* 30.7*  PLT 237 224 204  MCV 90.5 91.4 91.6  MCH 31.8 30.5 30.7  MCHC 35.1 33.3 33.6  RDW 13.9 14.2 14.0  LYMPHSABS 2.7  --   --   MONOABS 1.5*  --   --   EOSABS 0.1  --   --   BASOSABS 0.0  --   --     Chemistries   Recent Labs Lab 12/23/13 1857 12/24/13 0750 12/25/13 0515  NA 136* 137 137  K 4.4 4.5 4.3  CL 97 98 103  CO2 24 25 21   GLUCOSE 98 77 75  BUN 23 17 17   CREATININE 0.93 0.88 0.85  CALCIUM 9.0 8.5 8.1*  AST 26 20  --   ALT 20 15  --   ALKPHOS 57 59  --   BILITOT 0.8 1.0  --    ------------------------------------------------------------------------------------------------------------------ estimated creatinine clearance is 55.7 ml/min (by  C-G formula based on Cr of 0.85). ------------------------------------------------------------------------------------------------------------------ No results found for this basename: HGBA1C,  in the last 72 hours ------------------------------------------------------------------------------------------------------------------ No results found for this basename: CHOL, HDL, LDLCALC, TRIG, CHOLHDL, LDLDIRECT,  in the last 72 hours ------------------------------------------------------------------------------------------------------------------ No results found for this basename: TSH, T4TOTAL, FREET3, T3FREE, THYROIDAB,  in the last 72 hours ------------------------------------------------------------------------------------------------------------------ No results found for this basename: VITAMINB12, FOLATE, FERRITIN, TIBC, IRON, RETICCTPCT,  in the last 72 hours  Coagulation profile  Recent Labs Lab 12/24/13 0750  INR 1.23    No results found for this basename: DDIMER,  in the last 72 hours  Cardiac Enzymes No results found for this basename: CK, CKMB, TROPONINI, MYOGLOBIN,  in the last 168 hours ------------------------------------------------------------------------------------------------------------------ No components found with this basename: POCBNP,     Vrishank Moster D.O. on 12/25/2013 at 11:13 AM  Between 7am to 7pm - Pager - [phone]  After 7pm go to [email or web address] - password [email or web address]  And look for the night coverage person covering for me after hours  [facility] Office  [phone]

## 2013-12-26 LAB — BASIC METABOLIC PANEL
BUN: 12 mg/dL (ref 6–23)
CO2: 21 mEq/L (ref 19–32)
Calcium: 8.2 mg/dL — ABNORMAL LOW (ref 8.4–10.5)
Chloride: 104 mEq/L (ref 96–112)
Creatinine, Ser: 0.78 mg/dL (ref 0.50–1.10)
GFR, EST NON AFRICAN AMERICAN: 81 mL/min — AB (ref >90)
GLUCOSE: 88 mg/dL (ref 70–99)
POTASSIUM: 4.2 meq/L (ref 3.7–5.3)
SODIUM: 138 meq/L (ref 137–147)

## 2013-12-26 LAB — CBC
HCT: 30.4 % — ABNORMAL LOW (ref 36.0–46.0)
HEMOGLOBIN: 10.2 g/dL — AB (ref 12.0–15.0)
MCH: 30.4 pg (ref 26.0–34.0)
MCHC: 33.6 g/dL (ref 30.0–36.0)
MCV: 90.7 fL (ref 78.0–100.0)
Platelets: 224 10*3/uL (ref 150–400)
RBC: 3.35 MIL/uL — AB (ref 3.87–5.11)
RDW: 13.9 % (ref 11.5–15.5)
WBC: 12.5 10*3/uL — ABNORMAL HIGH (ref 4.0–10.5)

## 2013-12-26 MED ORDER — HYDROCODONE-ACETAMINOPHEN 5-325 MG PO TABS
1.0000 | ORAL_TABLET | Freq: Four times a day (QID) | ORAL | Status: DC | PRN
  Administered 2013-12-26 (×2): 1 via ORAL
  Filled 2013-12-26 (×2): qty 1

## 2013-12-26 MED ORDER — PANTOPRAZOLE SODIUM 40 MG PO TBEC
40.0000 mg | DELAYED_RELEASE_TABLET | Freq: Every day | ORAL | Status: DC
  Administered 2013-12-27: 40 mg via ORAL
  Filled 2013-12-26: qty 1

## 2013-12-26 NOTE — Progress Notes (Signed)
Patient ID: Kelli [name], female   DOB: 03-15-1941, 73 y.o.   MRN: [id number]    Subjective: Pt doing well today.  Minimal pain.  Occasional soreness.  Tolerating clear liquids well  Objective: Vital signs in last 24 hours: Temp:  [98.1 F (36.7 C)-98.7 F (37.1 C)] 98.7 F (37.1 C) (04/13 0601) Pulse Rate:  [76-80] 80 (04/13 0601) Resp:  [16-18] 16 (04/13 0601) BP: (116-135)/(49-55) 118/49 mmHg (04/13 0601) SpO2:  [93 %-98 %] 93 % (04/13 0601) Last BM Date: 12/25/13  Intake/Output from previous day: 04/12 0701 - 04/13 0700 In: 3400 [P.O.:300; I.V.:2400; IV Piggyback:700] Out: 400 [Urine:400] Intake/Output this shift: Total I/O In: 240 [P.O.:240] Out: -   PE: Abd: soft, essentially NT, ND, +BS  Lab Results:   Recent Labs  12/25/13 0515 12/26/13 0355  WBC 17.8* 12.5*  HGB 10.3* 10.2*  HCT 30.7* 30.4*  PLT 204 224   BMET  Recent Labs  12/25/13 0515 12/26/13 0355  NA 137 138  K 4.3 4.2  CL 103 104  CO2 21 21  GLUCOSE 75 88  BUN 17 12  CREATININE 0.85 0.78  CALCIUM 8.1* 8.2*   PT/INR  Recent Labs  12/24/13 0750  LABPROT 15.2  INR 1.23   CMP     Component Value Date/Time   NA 138 12/26/2013 0355   K 4.2 12/26/2013 0355   CL 104 12/26/2013 0355   CO2 21 12/26/2013 0355   GLUCOSE 88 12/26/2013 0355   BUN 12 12/26/2013 0355   CREATININE 0.78 12/26/2013 0355   CALCIUM 8.2* 12/26/2013 0355   PROT 6.6 12/24/2013 0750   ALBUMIN 3.0* 12/24/2013 0750   AST 20 12/24/2013 0750   ALT 15 12/24/2013 0750   ALKPHOS 59 12/24/2013 0750   BILITOT 1.0 12/24/2013 0750   GFRNONAA 81* 12/26/2013 0355   GFRAA >90 12/26/2013 0355   Lipase  No results found for this basename: lipase       Studies/Results: No results found.  Anti-infectives: Anti-infectives   Start     Dose/Rate Route Frequency Ordered Stop   12/24/13 1200  ciprofloxacin (CIPRO) IVPB 400 mg     400 mg 200 mL/hr over 60 Minutes Intravenous Every 12 hours 12/24/13 0239     12/24/13 0400  metroNIDAZOLE  (FLAGYL) IVPB 500 mg     500 mg 100 mL/hr over 60 Minutes Intravenous Every 8 hours 12/24/13 0239     12/24/13 0100  ciprofloxacin (CIPRO) IVPB 400 mg  Status:  Discontinued     400 mg 200 mL/hr over 60 Minutes Intravenous Every 12 hours 12/24/13 0040 12/24/13 0240   12/24/13 0045  ciprofloxacin (CIPRO) IVPB 400 mg  Status:  Discontinued     400 mg 200 mL/hr over 60 Minutes Intravenous  Once 12/24/13 0031 12/24/13 0041   12/24/13 0045  metroNIDAZOLE (FLAGYL) IVPB 500 mg  Status:  Discontinued     500 mg 100 mL/hr over 60 Minutes Intravenous  Once 12/24/13 0031 12/24/13 0041   12/24/13 0045  metroNIDAZOLE (FLAGYL) IVPB 500 mg  Status:  Discontinued     500 mg 100 mL/hr over 60 Minutes Intravenous Every 8 hours 12/24/13 0040 12/24/13 0241       Assessment/Plan  1. Diverticulitis with  Microperforation  Plan: 1. Patient doing well.  Will advance to full liquids today.  WBC trending down.  May consider transition to oral abx therapy today or tomorrow.   LOS: 3 days    Henreitta Cea 12/26/2013, 9:14 AM  Pager: [phone] ;

## 2013-12-26 NOTE — Progress Notes (Signed)
[facility]                                                                              Patient Demographics  Kelli [name], is a 73 y.o. female, DOB - [birth year], LDJ:[id number]  Admit date - 12/23/2013   Admitting Physician Berle Mull, MD  Outpatient Primary MD for the patient is Horatio Pel, MD  LOS - 3   Chief Complaint  Patient presents with  . Abdominal Pain        Assessment & Plan   Abdominal pain secondary to diverticulitis with microperforation -CT scan of the abdomen:Perforated diverticulitis with gas and inflammatory infiltration around the sigmoid colon. In addition, there is free intra-abdominal air extending to the upper abdomen. -General surgery consulted and following -Continue bowel rest, IV Cipro and Flagyl, and supportive care -Gastroenterology consulted as patient was to have and appointment with Dr. Penelope Coop on May 5 -Patient transitioned to clear liquid diet yesterday and tolerated it will -Surgery advancing diet today to full liquid  Atrial fibrillation -Per surgery recommendation, hold the Apixaban -Currently rate and rhythm controlled -Continue  metoprolol  Hypertension -Controlled -Continue metoprolol  PUD/GERD -Continue protonix -GI consulted  Code Status: Full  Family Communication:  Niece at bedside  Disposition Plan: Admitted  Time Spent in minutes   25 minutes  Procedures none  Consults   General surgery Gastroenterology  DVT Prophylaxis  Heparin  Lab Results  Component Value Date   PLT 224 12/26/2013    Medications  Scheduled Meds: . ciprofloxacin  400 mg Intravenous Q12H  . heparin  5,000 Units Subcutaneous 3 times per day  . metoprolol  12.5 mg Oral BID  . metronidazole  500 mg Intravenous Q8H  . pantoprazole (PROTONIX) IV  40 mg Intravenous Q24H   Continuous Infusions: . sodium chloride 100 mL/hr at 12/25/13 1630   PRN Meds:.morphine injection, ondansetron (ZOFRAN) IV,  ondansetron  Antibiotics    Anti-infectives   Start     Dose/Rate Route Frequency Ordered Stop   12/24/13 1200  ciprofloxacin (CIPRO) IVPB 400 mg     400 mg 200 mL/hr over 60 Minutes Intravenous Every 12 hours 12/24/13 0239     12/24/13 0400  metroNIDAZOLE (FLAGYL) IVPB 500 mg     500 mg 100 mL/hr over 60 Minutes Intravenous Every 8 hours 12/24/13 0239     12/24/13 0100  ciprofloxacin (CIPRO) IVPB 400 mg  Status:  Discontinued     400 mg 200 mL/hr over 60 Minutes Intravenous Every 12 hours 12/24/13 0040 12/24/13 0240   12/24/13 0045  ciprofloxacin (CIPRO) IVPB 400 mg  Status:  Discontinued     400 mg 200 mL/hr over 60 Minutes Intravenous  Once 12/24/13 0031 12/24/13 0041   12/24/13 0045  metroNIDAZOLE (FLAGYL) IVPB 500 mg  Status:  Discontinued     500 mg 100 mL/hr over 60 Minutes Intravenous  Once 12/24/13 0031 12/24/13 0041   12/24/13 0045  metroNIDAZOLE (FLAGYL) IVPB 500 mg  Status:  Discontinued     500 mg 100 mL/hr over 60 Minutes Intravenous Every 8 hours 12/24/13 0040 12/24/13 0241        Subjective:   Kelli [name] seen  and examined today.  Patient states abdominal pain has improved.  She was able to tolerate clear liquid diet yesterday. She continues to pass gas, has not had a bowel movement.  Objective:   Filed Vitals:   12/25/13 0529 12/25/13 1430 12/25/13 2230 12/26/13 0601  BP: 112/45 135/55 116/50 118/49  Pulse: 80 76 79 80  Temp: 98.4 F (36.9 C) 98.1 F (36.7 C) 98.5 F (36.9 C) 98.7 F (37.1 C)  TempSrc: Oral Oral Oral Oral  Resp: 20 18 16 16   Height:      Weight:      SpO2: 95% 98% 95% 93%    Wt Readings from Last 3 Encounters:  12/24/13 71.215 kg (157 lb)  08/18/13 72.576 kg (160 lb)  05/11/13 70.761 kg (156 lb)     Intake/Output Summary (Last 24 hours) at 12/26/13 0836 Last data filed at 12/26/13 0600  Gross per 24 hour  Intake   3400 ml  Output    400 ml  Net   3000 ml    Exam  General: Well developed, well nourished, NAD, appears  stated age  HEENT: NCAT, PERRLA, EOMI, Anicteic Sclera, mucous membranes moist.  Neck: Supple, no JVD, no masses  Cardiovascular: S1 S2 auscultated, no rubs, murmurs or gallops. Regular rate and rhythm.  Respiratory: Clear to auscultation bilaterally with equal chest rise  Abdomen: Soft, nontender, nondistended, + bowel sounds  Extremities: warm dry without cyanosis clubbing or edema  Neuro: AAOx3, no focal deficits   Skin: Without rashes exudates or nodules  Psych: Normal affect and demeanor with intact judgement and insight   Data Review   Micro Results Recent Results (from the past 240 hour(s))  URINE CULTURE     Status: None   Collection Time    12/23/13 10:00 PM      Result Value Ref Range Status   Specimen Description URINE, CLEAN CATCH   Final   Special Requests NONE   Final   Culture  Setup Time     Final   Value: 12/23/2013 22:45     Performed at [facility]     Final   Value: 50,000 COLONIES/ML     Performed at [facility]   Culture     Final   Value: Multiple bacterial morphotypes present, none predominant. Suggest appropriate recollection if clinically indicated.     Performed at [facility]   Report Status 12/25/2013 FINAL   Final    Radiology Reports Ct Abdomen Pelvis W Contrast  12/23/2013   CLINICAL DATA:  Low abdominal pain radiating to the upper abdomen. Fever and constipation.  EXAM: CT ABDOMEN AND PELVIS WITH CONTRAST  TECHNIQUE: Multidetector CT imaging of the abdomen and pelvis was performed using the standard protocol following bolus administration of intravenous contrast.  CONTRAST:  44mL OMNIPAQUE IOHEXOL 300 MG/ML  SOLN  COMPARISON:  None.  FINDINGS: Motion artifact limits visualization of the lung bases. There appears to be dependent atelectasis and scarring present with small pneumatoceles, likely emphysema. Calcification in the mitral valve annulus. Small esophageal hiatal hernia.  Small collections of  free intra-abdominal air are demonstrated in the upper abdomen and upper mesenteric. I see no indication of recent surgery. This indicates bowel perforation. No free contrast material is demonstrated. There is a focal thickening of wall of pylorus of the stomach which may indicate peptic ulcer disease. In addition, there is evidence of diverticulitis in the sigmoid colon with infiltration in the pericolonic fat  and with gas in the pericolonic mesenteric. This is likely to be the source of perforation.  Cholelithiasis. Aortic calcification without aneurysm. Liver, spleen, adrenal glands, kidneys, inferior vena cava, and retroperitoneal lymph nodes are unremarkable.  Pelvis: Bladder is decompressed. The uterus and ovaries do not appear enlarged. No significant pelvic lymphadenopathy. Degenerative changes in the lumbar spine. No destructive bone lesions appreciated.  IMPRESSION: Perforated diverticulitis with gas and inflammatory infiltration around the sigmoid colon. In addition, there is free intra-abdominal air extending to the upper abdomen. Nonspecific thickening of the pylorus of the stomach may also indicate peptic ulcer disease. Cholelithiasis.  Results were telephoned to Dr. Cathi Roan in the emergency department at 2326 hours on 12/23/2013.   Electronically Signed   By: Lucienne Capers M.D.   On: 12/23/2013 23:28    CBC  Recent Labs Lab 12/23/13 1857 12/24/13 0750 12/25/13 0515 12/26/13 0355  WBC 20.1* 18.8* 17.8* 12.5*  HGB 13.1 11.7* 10.3* 10.2*  HCT 37.3 35.1* 30.7* 30.4*  PLT 237 224 204 224  MCV 90.5 91.4 91.6 90.7  MCH 31.8 30.5 30.7 30.4  MCHC 35.1 33.3 33.6 33.6  RDW 13.9 14.2 14.0 13.9  LYMPHSABS 2.7  --   --   --   MONOABS 1.5*  --   --   --   EOSABS 0.1  --   --   --   BASOSABS 0.0  --   --   --     Chemistries   Recent Labs Lab 12/23/13 1857 12/24/13 0750 12/25/13 0515 12/26/13 0355  NA 136* 137 137 138  K 4.4 4.5 4.3 4.2  CL 97 98 103 104  CO2 24 25 21 21    GLUCOSE 98 77 75 88  BUN 23 17 17 12   CREATININE 0.93 0.88 0.85 0.78  CALCIUM 9.0 8.5 8.1* 8.2*  AST 26 20  --   --   ALT 20 15  --   --   ALKPHOS 57 59  --   --   BILITOT 0.8 1.0  --   --    ------------------------------------------------------------------------------------------------------------------ estimated creatinine clearance is 59.2 ml/min (by C-G formula based on Cr of 0.78). ------------------------------------------------------------------------------------------------------------------ No results found for this basename: HGBA1C,  in the last 72 hours ------------------------------------------------------------------------------------------------------------------ No results found for this basename: CHOL, HDL, LDLCALC, TRIG, CHOLHDL, LDLDIRECT,  in the last 72 hours ------------------------------------------------------------------------------------------------------------------ No results found for this basename: TSH, T4TOTAL, FREET3, T3FREE, THYROIDAB,  in the last 72 hours ------------------------------------------------------------------------------------------------------------------ No results found for this basename: VITAMINB12, FOLATE, FERRITIN, TIBC, IRON, RETICCTPCT,  in the last 72 hours  Coagulation profile  Recent Labs Lab 12/24/13 0750  INR 1.23    No results found for this basename: DDIMER,  in the last 72 hours  Cardiac Enzymes No results found for this basename: CK, CKMB, TROPONINI, MYOGLOBIN,  in the last 168 hours ------------------------------------------------------------------------------------------------------------------ No components found with this basename: POCBNP,     Taylah Dubiel D.O. on 12/26/2013 at 8:36 AM  Between 7am to 7pm - Pager - [phone]  After 7pm go to [email or web address] - password [email or web address]  And look for the night coverage person covering for me after hours  [facility] Office  [phone]

## 2013-12-26 NOTE — Consult Note (Signed)
Referring Provider: Dr. Ree Kida ([facility]) Primary Care Physician:  Horatio Pel, MD Primary Gastroenterologist:  Dr. Penelope Coop (new patient appointment pending for approximately one month from now)  Reason for Consultation:  GERD  HPI: Kelli [name] is a 73 y.o. female admitted to the hospital 3 days ago with locally perforated and contained diverticulitis which has been successfully managed on medical therapy with Cipro and Flagyl, without need for drainage.  We are asked to see the patient, in anticipation of a formal office consultation by Dr. Acquanetta Sit several weeks from now, at the request of the surgeons, because of a history of reflux symptoms. The patient was just started on pantoprazole recently.  The patient states that for the past year or so, she has needed to use TUMS for heartburn symptoms perhaps twice a month, on the average. She thinks it is related to what she eats. She does not have nocturnal symptoms (that is, with recumbency) nor frank regurgitation, she does have a fair amount of dry belching. No problem with anorexia, weight loss, or dysphagia.  From the lower tract perspective, the patient indicates that she had colonoscopy, she thinks by Dr. Jim Desanctis, about 6 or 8 years ago. She did not have any polyps at that time, by her report. She does have a tendency for constipation for which she uses stool softeners with fairly good results. She also sees occasional minimal rectal bleeding when her stools are hard in character.   Past Medical History  Diagnosis Date  . Hypertension   . NSTEMI (non-ST elevated myocardial infarction) 02/2013    demand ischemia in setting of AFib with RVR; ETT-Myoview 03/11/13: No ischemia or scar, EF 70%.  . Atrial fibrillation 02/2013    paroxsymal; c/b Type 2 NSTEMI; CHADS2-VASc=2 (age, sex - patient had come off anti-HTN meds in past); Eliquis started;  Echocardiogram 03/10/13: EF 95-28%, grade 1 diastolic dysfunction, mild MR, mild  LAE    Past Surgical History  Procedure Laterality Date  . Eye surgery    . Cataract extraction, bilateral    . Appendectomy    . Tonsillectomy      Prior to Admission medications   Medication Sig Start Date End Date Taking? Authorizing Provider  acetaminophen (TYLENOL) 500 MG tablet Take 500 mg by mouth daily as needed for pain.   Yes Historical Provider, MD  apixaban (ELIQUIS) 5 MG TABS tablet Take 1 tablet (5 mg total) by mouth 2 (two) times daily. 05/11/13  Yes Peter M Martinique, MD  Calcium Carb-Cholecalciferol (CALCIUM + D3) 600-200 MG-UNIT TABS Take 1 tablet by mouth daily.   Yes Historical Provider, MD  Cholecalciferol (VITAMIN D) 400 UNITS capsule Take 400 Units by mouth daily.   Yes Historical Provider, MD  docusate sodium (COLACE) 100 MG capsule Take 100 mg by mouth at bedtime.   Yes Historical Provider, MD  fish oil-omega-3 fatty acids 1000 MG capsule Take 1 g by mouth daily.   Yes Historical Provider, MD  metoprolol (LOPRESSOR) 50 MG tablet Take 0.5 tablets (25 mg total) by mouth 2 (two) times daily. 08/18/13  Yes Peter M Martinique, MD  mometasone (NASONEX) 50 MCG/ACT nasal spray Place 2 sprays into the nose daily as needed (for allergies).   Yes Historical Provider, MD  Multiple Vitamin (MULTIVITAMIN) capsule Take 1 capsule by mouth daily.   Yes Historical Provider, MD  vitamin C (ASCORBIC ACID) 500 MG tablet Take 500 mg by mouth daily.   Yes Historical Provider, MD    Current  Facility-Administered Medications  Medication Dose Route Frequency Provider Last Rate Last Dose  . 0.9 %  sodium chloride infusion   Intravenous Continuous Berle Mull, MD 100 mL/hr at 12/26/13 1837    . ciprofloxacin (CIPRO) IVPB 400 mg  400 mg Intravenous Q12H Berle Mull, MD   400 mg at 12/26/13 1244  . heparin injection 5,000 Units  5,000 Units Subcutaneous 3 times per day Berle Mull, MD   5,000 Units at 12/26/13 1428  . HYDROcodone-acetaminophen (NORCO/VICODIN) 5-325 MG per tablet 1 tablet  1 tablet  Oral Q6H PRN Cristal Ford, DO   1 tablet at 12/26/13 1425  . metoprolol tartrate (LOPRESSOR) tablet 12.5 mg  12.5 mg Oral BID Berle Mull, MD   12.5 mg at 12/26/13 1104  . metroNIDAZOLE (FLAGYL) IVPB 500 mg  500 mg Intravenous Q8H Berle Mull, MD   500 mg at 12/26/13 1105  . morphine 2 MG/ML injection 1-2 mg  1-2 mg Intravenous Q4H PRN Dianne Dun, NP   2 mg at 12/24/13 1819  . ondansetron (ZOFRAN) tablet 4 mg  4 mg Oral Q6H PRN Berle Mull, MD       Or  . ondansetron (ZOFRAN) injection 4 mg  4 mg Intravenous Q6H PRN Berle Mull, MD      . Derrill Memo ON 12/27/2013] pantoprazole (PROTONIX) EC tablet 40 mg  40 mg Oral Daily Maryann Mikhail, DO        Allergies as of 12/23/2013 - Review Complete 12/23/2013  Allergen Reaction Noted  . Augmentin [amoxicillin-pot clavulanate] Itching and Rash 03/09/2013    History reviewed. No pertinent family history.  History   Social History  . Marital Status: Married    Spouse Name: N/A    Number of Children: 3  . Years of Education: N/A   Occupational History  . Surveyor, quantity- Day care    Social History Main Topics  . Smoking status: Never Smoker   . Smokeless tobacco: Never Used  . Alcohol Use: No  . Drug Use: No  . Sexual Activity: Not on file   Other Topics Concern  . Not on file   Social History Narrative  . No narrative on file    Review of Systems: See history of present illness  Physical Exam: Vital signs in last 24 hours: Temp:  [98.2 F (36.8 C)-98.7 F (37.1 C)] 98.2 F (36.8 C) (04/13 1330) Pulse Rate:  [70-84] 70 (04/13 1330) Resp:  [16-18] 18 (04/13 1330) BP: (116-132)/(49-55) 127/55 mmHg (04/13 1330) SpO2:  [93 %-96 %] 96 % (04/13 1330) Last BM Date: 12/26/13 This is a very pleasant and healthy-appearing, mildly overweight Caucasian female, sitting in the chair in absolutely no distress. She is anicteric and without pallor. Chest is clear, heart without murmur or arrhythmias. Abdomen is without  overt tenderness, markedly improved, by her report, compared when she first came in the hospital.  Intake/Output from previous day: 04/12 0701 - 04/13 0700 In: 3800 [P.O.:300; I.V.:2400; IV Piggyback:1100] Out: 400 [Urine:400] Intake/Output this shift: Total I/O In: 2043.3 [P.O.:480; I.V.:1263.3; IV Piggyback:300] Out: -   Lab Results:  Recent Labs  12/24/13 0750 12/25/13 0515 12/26/13 0355  WBC 18.8* 17.8* 12.5*  HGB 11.7* 10.3* 10.2*  HCT 35.1* 30.7* 30.4*  PLT 224 204 224   BMET  Recent Labs  12/24/13 0750 12/25/13 0515 12/26/13 0355  NA 137 137 138  K 4.5 4.3 4.2  CL 98 103 104  CO2 25 21 21   GLUCOSE 77 75 88  BUN 17  17 12  CREATININE 0.88 0.85 0.78  CALCIUM 8.5 8.1* 8.2*   LFT  Recent Labs  12/24/13 0750  PROT 6.6  ALBUMIN 3.0*  AST 20  ALT 15  ALKPHOS 59  BILITOT 1.0   PT/INR  Recent Labs  12/24/13 0750  LABPROT 15.2  INR 1.23    Studies/Results: No results found.  Impression: 1. Classic, uncomplicated gastroesophageal reflux 2. Diverticulitis, under treatment with good response  Plan: I would manage the patient with continued PPI therapy at the time of discharge. If she does not have good insurance coverage for pantoprazole, over-the-counter omeprazole 20 mg once daily would probably suffice for this patient who has relatively mild reflux symptoms. I do not and she will necessarily need lifelong PPI therapy.  When she is seen by Dr. Penelope Coop several weeks from now, as already scheduled, consideration could be given to reducing the patient's PPI therapy.  In addition, she should probably have updated colonoscopy, because inflammatory carcinoma can sometimes masquerade as diverticulitis, and it has been a number of years since she last had colonoscopy.  I will sign off, but please contact me if I can be of further assistance in this patient's care.  Cleotis Nipper, M.D. [phone]     LOS: 3 days   Kelli [name]  12/26/2013,  7:00 PM

## 2013-12-26 NOTE — Progress Notes (Signed)
Improving WBC improving Oral abx.  Imogene Burn. Georgette Dover, MD, [facility]  General/ Trauma Surgery  12/26/2013 2:32 PM

## 2013-12-27 LAB — CBC
HCT: 29.2 % — ABNORMAL LOW (ref 36.0–46.0)
HEMOGLOBIN: 9.8 g/dL — AB (ref 12.0–15.0)
MCH: 30.3 pg (ref 26.0–34.0)
MCHC: 33.6 g/dL (ref 30.0–36.0)
MCV: 90.4 fL (ref 78.0–100.0)
Platelets: 232 10*3/uL (ref 150–400)
RBC: 3.23 MIL/uL — ABNORMAL LOW (ref 3.87–5.11)
RDW: 13.9 % (ref 11.5–15.5)
WBC: 10.5 10*3/uL (ref 4.0–10.5)

## 2013-12-27 LAB — CLOSTRIDIUM DIFFICILE BY PCR: CDIFFPCR: NEGATIVE

## 2013-12-27 MED ORDER — METRONIDAZOLE 500 MG PO TABS: 500.0000 mg | ORAL_TABLET | Freq: Three times a day (TID) | ORAL | Status: DC

## 2013-12-27 MED ORDER — PANTOPRAZOLE SODIUM 40 MG PO TBEC: 40.0000 mg | DELAYED_RELEASE_TABLET | Freq: Every day | ORAL | Status: DC

## 2013-12-27 MED ORDER — CIPROFLOXACIN HCL 500 MG PO TABS: 500.0000 mg | ORAL_TABLET | Freq: Two times a day (BID) | ORAL | Status: AC

## 2013-12-27 NOTE — Discharge Summary (Signed)
Physician Discharge Summary  Kelli [name] OEV:[id number] DOB: 1941-06-16 DOA: 12/23/2013  PCP: Horatio Pel, MD  Admit date: 12/23/2013 Discharge date: 12/27/2013  Time spent: 45 minutes  Recommendations for Outpatient Follow-up:  Patient will be discharged to home. She is to follow with her primary care physician within one week of discharge as well as Dr. Rosendo Gros in 2-3 weeks. She will need to follow Dr. Penelope Coop in May. Patient will also need to have any colonoscopy to be scheduled by gastroenterology. Patient should continue taking her medications as prescribed. She should continue a soft bland diet and advance it as tolerated.  Discharge Diagnoses:  Abdominal pain secondary to diverticulitis with microperforation Atrial fibrillation Hypertension PUD/GERD  Discharge Condition: Stable  Diet recommendation: Soft Diet, bland  Filed Weights   12/23/13 1855 12/24/13 0200  Weight: 71.033 kg (156 lb 9.6 oz) 71.215 kg (157 lb)    History of present illness:  Kelli [name] is a 73 y.o. female with Past medical history of hypertension, A. fib.  The patient presented with complaints of lower abdominal pain. She mentions 2 days ago she was at her baseline and did not have any significant complaint. She also had fever throughout the night. She had no improvement in her pain, and had diaphoresis and acid reflux. When she woke up her pain improved, later on it came back and this time this was also an upper abdominal region. The pain she described it as a combination of cramps and a dull ache. She denies any active bleeding but had 2 loose bowel movement earlier in the morning. She mentions she is passing gas. She denied any burning urination. She denied any prior episode of similarity. She had history of cholecystectomy.  Patient was evaluated by general surgery who requested admission to medicine service and they will be following the patient has a consult.   Hospital Course:  Abdominal  pain secondary to diverticulitis with microperforation  -CT scan of the abdomen:Perforated diverticulitis with gas and inflammatory infiltration around the sigmoid colon. In addition, there is free intra-abdominal air extending to the upper abdomen.  -General surgery consulted and following  -Was initially on bowel rest, IV Cipro and Flagyl, and supportive care  -Patient was also tested for C. difficile which was negative. -Gastroenterology consulted as patient was to have and appointment with Dr. Penelope Coop on May 5  -Patient transitioned to clear liquid diet yesterday and tolerated it will  -Surgery advancing diet, patient was able to tolerate a soft bland diet. -She will be discharged with by mouth Cipro and Flagyl to be continued for a total of 14 days  -Patient will need followup with general surgery in 2-3 weeks.  Atrial fibrillation  -Per surgery recommendation, hold the Apixaban while admitted -Currently rate and rhythm controlled  -Continue metoprolol  -Will continue at discharge  Hypertension  -Controlled  -Continue metoprolol   PUD/GERD  -Continue protonix  -GI consulted -Patient will also need to have a colonoscopy as an outpatient  Procedures: None  Consultations: Surgery Gastroenterology  Discharge Exam: Filed Vitals:   12/27/13 1023  BP: 140/55  Pulse:   Temp:   Resp:    Exam  General: Well developed, well nourished, NAD, appears stated age  HEENT: NCAT, PERRLA, EOMI, Anicteic Sclera, mucous membranes moist.  Neck: Supple, no JVD, no masses  Cardiovascular: S1 S2 auscultated, no rubs, murmurs or gallops. Regular rate and rhythm.  Respiratory: Clear to auscultation bilaterally with equal chest rise  Abdomen: Soft, nontender, nondistended, +  bowel sounds  Extremities: warm dry without cyanosis clubbing or edema  Neuro: AAOx3, no focal deficits  Skin: Without rashes exudates or nodules  Psych: Normal affect and demeanor with intact judgement and  insight  Discharge Instructions      Discharge Orders   Future Appointments Provider Department Dept Phone   01/09/2014 11:10 AM [facility]  IMAGING [phone]   Please wear two piece clothing and wear no powder or deodorant. Please arrive 15 minutes early prior to your appointment time.   Future Orders Complete By Expires   Discharge instructions  As directed    Increase activity slowly  As directed        Medication List         acetaminophen 500 MG tablet  Commonly known as:  TYLENOL  Take 500 mg by mouth daily as needed for pain.     apixaban 5 MG Tabs tablet  Commonly known as:  ELIQUIS  Take 1 tablet (5 mg total) by mouth 2 (two) times daily.     Calcium + D3 600-200 MG-UNIT Tabs  Take 1 tablet by mouth daily.     ciprofloxacin 500 MG tablet  Commonly known as:  CIPRO  Take 1 tablet (500 mg total) by mouth 2 (two) times daily.     docusate sodium 100 MG capsule  Commonly known as:  COLACE  Take 100 mg by mouth at bedtime.     fish oil-omega-3 fatty acids 1000 MG capsule  Take 1 g by mouth daily.     metoprolol 50 MG tablet  Commonly known as:  LOPRESSOR  Take 0.5 tablets (25 mg total) by mouth 2 (two) times daily.     metroNIDAZOLE 500 MG tablet  Commonly known as:  FLAGYL  Take 1 tablet (500 mg total) by mouth 3 (three) times daily.     mometasone 50 MCG/ACT nasal spray  Commonly known as:  NASONEX  Place 2 sprays into the nose daily as needed (for allergies).     multivitamin capsule  Take 1 capsule by mouth daily.     pantoprazole 40 MG tablet  Commonly known as:  PROTONIX  Take 1 tablet (40 mg total) by mouth daily.     vitamin C 500 MG tablet  Commonly known as:  ASCORBIC ACID  Take 500 mg by mouth daily.     Vitamin D 400 UNITS capsule  Take 400 Units by mouth daily.       Allergies  Allergen Reactions  . Augmentin [Amoxicillin-Pot Clavulanate] Itching and Rash   Follow-up Information   Follow up with  Reyes Ivan, MD In 3 weeks. (2-3 weeks for diverticulitis with mircoperforation follow up )    Specialty:  General Surgery   Contact information:   [id number] [place] [address] [phone]       Follow up with Horatio Pel, MD. Schedule an appointment as soon as possible for a visit in 1 week. [facility] followup)    Specialty:  Internal Medicine   Contact information:   [address] [place] [address] [phone]        The results of significant diagnostics from this hospitalization (including imaging, microbiology, ancillary and laboratory) are listed below for reference.    Significant Diagnostic Studies: Ct Abdomen Pelvis W Contrast  12/23/2013   CLINICAL DATA:  Low abdominal pain radiating to the upper abdomen. Fever and constipation.  EXAM: CT ABDOMEN AND PELVIS WITH CONTRAST  TECHNIQUE: Multidetector CT imaging of the abdomen and pelvis was performed using the standard protocol following bolus administration of intravenous contrast.  CONTRAST:  16mL OMNIPAQUE IOHEXOL 300 MG/ML  SOLN  COMPARISON:  None.  FINDINGS: Motion artifact limits visualization of the lung bases. There appears to be dependent atelectasis and scarring present with small pneumatoceles, likely emphysema. Calcification in the mitral valve annulus. Small esophageal hiatal hernia.  Small collections of free intra-abdominal air are demonstrated in the upper abdomen and upper mesenteric. I see no indication of recent surgery. This indicates bowel perforation. No free contrast material is demonstrated. There is a focal thickening of wall of pylorus of the stomach which may indicate peptic ulcer disease. In addition, there is evidence of diverticulitis in the sigmoid colon with infiltration in the pericolonic fat and with gas in the pericolonic mesenteric. This is likely to be the source of perforation.  Cholelithiasis. Aortic calcification without aneurysm. Liver, spleen,  adrenal glands, kidneys, inferior vena cava, and retroperitoneal lymph nodes are unremarkable.  Pelvis: Bladder is decompressed. The uterus and ovaries do not appear enlarged. No significant pelvic lymphadenopathy. Degenerative changes in the lumbar spine. No destructive bone lesions appreciated.  IMPRESSION: Perforated diverticulitis with gas and inflammatory infiltration around the sigmoid colon. In addition, there is free intra-abdominal air extending to the upper abdomen. Nonspecific thickening of the pylorus of the stomach may also indicate peptic ulcer disease. Cholelithiasis.  Results were telephoned to Dr. Cathi Roan in the emergency department at 2326 hours on 12/23/2013.   Electronically Signed   By: Lucienne Capers M.D.   On: 12/23/2013 23:28    Microbiology: Recent Results (from the past 240 hour(s))  URINE CULTURE     Status: None   Collection Time    12/23/13 10:00 PM      Result Value Ref Range Status   Specimen Description URINE, CLEAN CATCH   Final   Special Requests NONE   Final   Culture  Setup Time     Final   Value: 12/23/2013 22:45     Performed at [facility]     Final   Value: 50,000 COLONIES/ML     Performed at [facility]   Culture     Final   Value: Multiple bacterial morphotypes present, none predominant. Suggest appropriate recollection if clinically indicated.     Performed at [facility]   Report Status 12/25/2013 FINAL   Final  CLOSTRIDIUM DIFFICILE BY PCR     Status: None   Collection Time    12/27/13  8:01 AM      Result Value Ref Range Status   C difficile by pcr NEGATIVE  NEGATIVE Final     Labs: Basic Metabolic Panel:  Recent Labs Lab 12/23/13 1857 12/24/13 0750 12/25/13 0515 12/26/13 0355  NA 136* 137 137 138  K 4.4 4.5 4.3 4.2  CL 97 98 103 104  CO2 24 25 21 21   GLUCOSE 98 77 75 88  BUN 23 17 17 12   CREATININE 0.93 0.88 0.85 0.78  CALCIUM 9.0 8.5 8.1* 8.2*   Liver Function Tests:  Recent  Labs Lab 12/23/13 1857 12/24/13 0750  AST 26 20  ALT 20 15  ALKPHOS 57 59  BILITOT 0.8 1.0  PROT 7.4 6.6  ALBUMIN 3.5 3.0*   No results found for this basename: LIPASE, AMYLASE,  in the last 168 hours No results found for this basename: AMMONIA,  in the last 168 hours CBC:  Recent Labs Lab 12/23/13 1857 12/24/13 0750 12/25/13 0515 12/26/13 0355 12/27/13 0350  WBC 20.1* 18.8* 17.8* 12.5* 10.5  NEUTROABS 15.7*  --   --   --   --   HGB 13.1 11.7* 10.3* 10.2* 9.8*  HCT 37.3 35.1* 30.7* 30.4* 29.2*  MCV 90.5 91.4 91.6 90.7 90.4  PLT 237 224 204 224 232   Cardiac Enzymes: No results found for this basename: CKTOTAL, CKMB, CKMBINDEX, TROPONINI,  in the last 168 hours BNP: BNP (last 3 results) No results found for this basename: PROBNP,  in the last 8760 hours CBG: No results found for this basename: GLUCAP,  in the last 168 hours     Signed:  Cristal Ford  [facility] 12/27/2013, 11:40 AM  \

## 2013-12-27 NOTE — Discharge Instructions (Signed)
Diverticulitis °A diverticulum is a small pouch or sac on the colon. Diverticulosis is the presence of these diverticula on the colon. Diverticulitis is the irritation (inflammation) or infection of diverticula. °CAUSES  °The colon and its diverticula contain bacteria. If food particles block the tiny opening to a diverticulum, the bacteria inside can grow and cause an increase in pressure. This leads to infection and inflammation and is called diverticulitis. °SYMPTOMS  °· Abdominal pain and tenderness. Usually, the pain is located on the left side of your abdomen. However, it could be located elsewhere. °· Fever. °· Bloating. °· Feeling sick to your stomach (nausea). °· Throwing up (vomiting). °· Abnormal stools. °DIAGNOSIS  °Your caregiver will take a history and perform a physical exam. Since many things can cause abdominal pain, other tests may be necessary. Tests may include: °· Blood tests. °· Urine tests. °· X-ray of the abdomen. °· CT scan of the abdomen. °Sometimes, surgery is needed to determine if diverticulitis or other conditions are causing your symptoms. °TREATMENT  °Most of the time, you can be treated without surgery. Treatment includes: °· Resting the bowels by only having liquids for a few days. As you improve, you will need to eat a low-fiber diet. °· Intravenous (IV) fluids if you are losing body fluids (dehydrated). °· Antibiotic medicines that treat infections may be given. °· Pain and nausea medicine, if needed. °· Surgery if the inflamed diverticulum has burst. °HOME CARE INSTRUCTIONS  °· Try a clear liquid diet (broth, tea, or water for as long as directed by your caregiver). You may then gradually begin a low-fiber diet as tolerated.  °A low-fiber diet is a diet with less than 10 grams of fiber. Choose the foods below to reduce fiber in the diet: °· White breads, cereals, rice, and pasta. °· Cooked fruits and vegetables or soft fresh fruits and vegetables without the skin. °· Ground or  well-cooked tender beef, ham, veal, lamb, pork, or poultry. °· Eggs and seafood. °· After your diverticulitis symptoms have improved, your caregiver may put you on a high-fiber diet. A high-fiber diet includes 14 grams of fiber for every 1000 calories consumed. For a standard 2000 calorie diet, you would need 28 grams of fiber. Follow these diet guidelines to help you increase the fiber in your diet. It is important to slowly increase the amount fiber in your diet to avoid gas, constipation, and bloating. °· Choose whole-grain breads, cereals, pasta, and brown rice. °· Choose fresh fruits and vegetables with the skin on. Do not overcook vegetables because the more vegetables are cooked, the more fiber is lost. °· Choose more nuts, seeds, legumes, dried peas, beans, and lentils. °· Look for food products that have greater than 3 grams of fiber per serving on the Nutrition Facts label. °· Take all medicine as directed by your caregiver. °· If your caregiver has given you a follow-up appointment, it is very important that you go. Not going could result in lasting (chronic) or permanent injury, pain, and disability. If there is any problem keeping the appointment, call to reschedule. °SEEK MEDICAL CARE IF:  °· Your pain does not improve. °· You have a hard time advancing your diet beyond clear liquids. °· Your bowel movements do not return to normal. °SEEK IMMEDIATE MEDICAL CARE IF:  °· Your pain becomes worse. °· You have an oral temperature above 102° F (38.9° C), not controlled by medicine. °· You have repeated vomiting. °· You have bloody or black, tarry stools. °·   Symptoms that brought you to your caregiver become worse or are not getting better. °MAKE SURE YOU:  °· Understand these instructions. °· Will watch your condition. °· Will get help right away if you are not doing well or get worse. °Document Released: 06/11/2005 Document Revised: 11/24/2011 Document Reviewed: 10/07/2010 °[employer]® Patient Information  ©2014 [employer]. ° °

## 2013-12-27 NOTE — Progress Notes (Signed)
Patient ID: Kelli [name], female   DOB: 06-11-1941, 73 y.o.   MRN: [id number]   Subjective: Tolerating FL, no pain.  C/o diarrhea this AM.  C. Diff is pending.   Afebrile.  Normal white count.   Objective:  Vital signs:  Filed Vitals:   12/26/13 1330 12/26/13 2152 12/26/13 2153 12/27/13 0524  BP: 127/55 134/61 134/61 122/56  Pulse: 70 78 78 71  Temp: 98.2 F (36.8 C)  98.6 F (37 C) 98.5 F (36.9 C)  TempSrc: Oral  Oral Oral  Resp: 18  16 20   Height:      Weight:      SpO2: 96%  98% 93%    Last BM Date: 12/26/13  Intake/Output   Yesterday:  04/13 0701 - 04/14 0700 In: 7989 [P.O.:970; I.V.:2300; IV Piggyback:300] Out: -  This shift:    I/O last 3 completed shifts: In: 5506.7 [P.O.:970; I.V.:3836.7; IV Piggyback:700] Out: -     Physical Exam: General: Pt awake/alert/oriented x4 in noacute distress Abdomen: Soft.  Nondistended. nontender.  No evidence of peritonitis.  No incarcerated hernias.    Problem List:   Principal Problem:   Perforated diverticulitis Active Problems:   Atrial fibrillation   Unspecified constipation    Results:   Labs: Results for orders placed during the hospital encounter of 12/23/13 (from the past 48 hour(s))  CBC     Status: Abnormal   Collection Time    12/26/13  3:55 AM      Result Value Ref Range   WBC 12.5 (*) 4.0 - 10.5 K/uL   RBC 3.35 (*) 3.87 - 5.11 MIL/uL   Hemoglobin 10.2 (*) 12.0 - 15.0 g/dL   HCT 30.4 (*) 36.0 - 46.0 %   MCV 90.7  78.0 - 100.0 fL   MCH 30.4  26.0 - 34.0 pg   MCHC 33.6  30.0 - 36.0 g/dL   RDW 13.9  11.5 - 15.5 %   Platelets 224  150 - 400 K/uL  BASIC METABOLIC PANEL     Status: Abnormal   Collection Time    12/26/13  3:55 AM      Result Value Ref Range   Sodium 138  137 - 147 mEq/L   Potassium 4.2  3.7 - 5.3 mEq/L   Chloride 104  96 - 112 mEq/L   CO2 21  19 - 32 mEq/L   Glucose, Bld 88  70 - 99 mg/dL   BUN 12  6 - 23 mg/dL   Creatinine, Ser 0.78  0.50 - 1.10 mg/dL   Calcium 8.2 (*)  8.4 - 10.5 mg/dL   GFR calc non Af Amer 81 (*) >90 mL/min   GFR calc Af Amer >90  >90 mL/min   Comment: (NOTE)     The eGFR has been calculated using the CKD EPI equation.     This calculation has not been validated in all clinical situations.     eGFR's persistently <90 mL/min signify possible Chronic Kidney     Disease.  CBC     Status: Abnormal   Collection Time    12/27/13  3:50 AM      Result Value Ref Range   WBC 10.5  4.0 - 10.5 K/uL   RBC 3.23 (*) 3.87 - 5.11 MIL/uL   Hemoglobin 9.8 (*) 12.0 - 15.0 g/dL   HCT 29.2 (*) 36.0 - 46.0 %   MCV 90.4  78.0 - 100.0 fL   MCH 30.3  26.0 - 34.0 pg  MCHC 33.6  30.0 - 36.0 g/dL   RDW 13.9  11.5 - 15.5 %   Platelets 232  150 - 400 K/uL    Imaging / Studies: No results found.  Medications / Allergies:   Antibiotics: Anti-infectives   Start     Dose/Rate Route Frequency Ordered Stop   12/24/13 1200  ciprofloxacin (CIPRO) IVPB 400 mg     400 mg 200 mL/hr over 60 Minutes Intravenous Every 12 hours 12/24/13 0239     12/24/13 0400  metroNIDAZOLE (FLAGYL) IVPB 500 mg     500 mg 100 mL/hr over 60 Minutes Intravenous Every 8 hours 12/24/13 0239     12/24/13 0100  ciprofloxacin (CIPRO) IVPB 400 mg  Status:  Discontinued     400 mg 200 mL/hr over 60 Minutes Intravenous Every 12 hours 12/24/13 0040 12/24/13 0240   12/24/13 0045  ciprofloxacin (CIPRO) IVPB 400 mg  Status:  Discontinued     400 mg 200 mL/hr over 60 Minutes Intravenous  Once 12/24/13 0031 12/24/13 0041   12/24/13 0045  metroNIDAZOLE (FLAGYL) IVPB 500 mg  Status:  Discontinued     500 mg 100 mL/hr over 60 Minutes Intravenous  Once 12/24/13 0031 12/24/13 0041   12/24/13 0045  metroNIDAZOLE (FLAGYL) IVPB 500 mg  Status:  Discontinued     500 mg 100 mL/hr over 60 Minutes Intravenous Every 8 hours 12/24/13 0040 12/24/13 0241      Assessment/Plan Diverticulitis with Microperforation -advance to soft diet, if able to tolerate, stable for discharge from surgical  standpoint -cipro/flagyl x14 days -follow up with Dr. Lyman Bishop in 2-3 weeks -GI follow up for colonoscopy   Erby Pian, [facility] Pager [phone] Office [phone]  12/27/2013 9:22 AM

## 2013-12-27 NOTE — Progress Notes (Signed)
Discharge instructions explained and given to pt.  Pt. Verbalized understanding of all orders/instructions.  Prescriptions called in by MD to pt.'s pharmacy.  IV removed.  All questions answered and pt. Discharged to home with son in stable condition. Syliva Overman

## 2013-12-27 NOTE — Progress Notes (Signed)
Imogene Burn. Georgette Dover, MD, [facility] Surgery  General/ Trauma Surgery  12/27/2013 2:34 PM

## 2013-12-28 ENCOUNTER — Telehealth (INDEPENDENT_AMBULATORY_CARE_PROVIDER_SITE_OTHER): Payer: Self-pay | Admitting: General Surgery

## 2013-12-28 NOTE — Telephone Encounter (Signed)
Pt called to review her discharge instructions and make follow up appt with AR.  She has good understanding of all and will call back if needed.

## 2014-01-03 ENCOUNTER — Encounter (HOSPITAL_COMMUNITY): Payer: Self-pay | Admitting: Emergency Medicine

## 2014-01-03 ENCOUNTER — Emergency Department (HOSPITAL_COMMUNITY): Payer: Medicare Other

## 2014-01-03 ENCOUNTER — Emergency Department (HOSPITAL_COMMUNITY)
Admission: EM | Admit: 2014-01-03 | Discharge: 2014-01-03 | Disposition: A | Discharge status: Home / Self Care | Payer: Medicare Other | Attending: Emergency Medicine | Admitting: Emergency Medicine

## 2014-01-03 DIAGNOSIS — Z792 Long term (current) use of antibiotics: Secondary | ICD-10-CM | POA: Insufficient documentation

## 2014-01-03 DIAGNOSIS — Z7901 Long term (current) use of anticoagulants: Secondary | ICD-10-CM | POA: Insufficient documentation

## 2014-01-03 DIAGNOSIS — R109 Unspecified abdominal pain: Secondary | ICD-10-CM | POA: Insufficient documentation

## 2014-01-03 DIAGNOSIS — I4891 Unspecified atrial fibrillation: Secondary | ICD-10-CM | POA: Insufficient documentation

## 2014-01-03 DIAGNOSIS — I10 Essential (primary) hypertension: Secondary | ICD-10-CM | POA: Insufficient documentation

## 2014-01-03 DIAGNOSIS — Z79899 Other long term (current) drug therapy: Secondary | ICD-10-CM | POA: Insufficient documentation

## 2014-01-03 DIAGNOSIS — R110 Nausea: Secondary | ICD-10-CM | POA: Insufficient documentation

## 2014-01-03 DIAGNOSIS — M542 Cervicalgia: Secondary | ICD-10-CM | POA: Insufficient documentation

## 2014-01-03 DIAGNOSIS — R0789 Other chest pain: Secondary | ICD-10-CM | POA: Insufficient documentation

## 2014-01-03 DIAGNOSIS — I252 Old myocardial infarction: Secondary | ICD-10-CM | POA: Insufficient documentation

## 2014-01-03 LAB — CBC
HEMATOCRIT: 39.7 % (ref 36.0–46.0)
Hemoglobin: 13.3 g/dL (ref 12.0–15.0)
MCH: 30.5 pg (ref 26.0–34.0)
MCHC: 33.5 g/dL (ref 30.0–36.0)
MCV: 91.1 fL (ref 78.0–100.0)
Platelets: 381 10*3/uL (ref 150–400)
RBC: 4.36 MIL/uL (ref 3.87–5.11)
RDW: 14.3 % (ref 11.5–15.5)
WBC: 10.1 10*3/uL (ref 4.0–10.5)

## 2014-01-03 LAB — I-STAT TROPONIN, ED
Troponin i, poc: 0 ng/mL (ref 0.00–0.08)
Troponin i, poc: 0.01 ng/mL (ref 0.00–0.08)

## 2014-01-03 LAB — BASIC METABOLIC PANEL
BUN: 11 mg/dL (ref 6–23)
CALCIUM: 9.3 mg/dL (ref 8.4–10.5)
CO2: 25 mEq/L (ref 19–32)
Chloride: 98 mEq/L (ref 96–112)
Creatinine, Ser: 0.95 mg/dL (ref 0.50–1.10)
GFR, EST AFRICAN AMERICAN: 67 mL/min — AB (ref >90)
GFR, EST NON AFRICAN AMERICAN: 58 mL/min — AB (ref >90)
Glucose, Bld: 97 mg/dL (ref 70–99)
Potassium: 4.2 mEq/L (ref 3.7–5.3)
Sodium: 136 mEq/L — ABNORMAL LOW (ref 137–147)

## 2014-01-03 LAB — PROTIME-INR
INR: 1.19 (ref 0.00–1.49)
PROTHROMBIN TIME: 14.8 s (ref 11.6–15.2)

## 2014-01-03 NOTE — ED Provider Notes (Signed)
CSN: [id number]     Arrival date & time 01/03/14  1746 History   First MD Initiated Contact with Patient 01/03/14 1905     Chief Complaint  Patient presents with  . Jaw Pain     (Consider location/radiation/quality/duration/timing/severity/associated sxs/prior Treatment) HPI Complains of bilateral jaw pain onset 11 AM today and mild tightness in her posterior neck. Pain in neck is worse with flexing her neck. She also reports having suffered mild anterior chest discomfort today which has now resolved without treatment..Pain is minimal at present. Associated symptoms include mild nausea she denies shortness of breath or sweatiness. No treatment prior to coming here. Nothing makes symptoms better or worse. No chest pain presently he does have mild jaw discomfort presently. Past Medical History  Diagnosis Date  . Hypertension   . NSTEMI (non-ST elevated myocardial infarction) 02/2013    demand ischemia in setting of AFib with RVR; ETT-Myoview 03/11/13: No ischemia or scar, EF 70%.  . Atrial fibrillation 02/2013    paroxsymal; c/b Type 2 NSTEMI; CHADS2-VASc=2 (age, sex - patient had come off anti-HTN meds in past); Eliquis started;  Echocardiogram 03/10/13: EF 62-13%, grade 1 diastolic dysfunction, mild MR, mild LAE   Past Surgical History  Procedure Laterality Date  . Eye surgery    . Cataract extraction, bilateral    . Appendectomy    . Tonsillectomy     No family history on file. History  Substance Use Topics  . Smoking status: Never Smoker   . Smokeless tobacco: Never Used  . Alcohol Use: No   OB History   Grav Para Term Preterm Abortions TAB SAB Ect Mult Living                 Review of Systems  Constitutional: Negative.   HENT: Negative.        Jaw pain  Respiratory: Negative.   Cardiovascular: Positive for chest pain.  Gastrointestinal: Positive for nausea.  Musculoskeletal: Negative.   Skin: Negative.   Neurological: Negative.   Psychiatric/Behavioral: Negative.    All other systems reviewed and are negative.     Allergies  Augmentin  Home Medications   Prior to Admission medications   Medication Sig Start Date End Date Taking? Authorizing Provider  acetaminophen (TYLENOL) 500 MG tablet Take 500 mg by mouth daily as needed for pain.   Yes Historical Provider, MD  apixaban (ELIQUIS) 5 MG TABS tablet Take 1 tablet (5 mg total) by mouth 2 (two) times daily. 05/11/13  Yes Peter M Martinique, MD  Calcium Carb-Cholecalciferol (CALCIUM + D3) 600-200 MG-UNIT TABS Take 1 tablet by mouth daily.   Yes Historical Provider, MD  Cholecalciferol (VITAMIN D) 400 UNITS capsule Take 400 Units by mouth daily.   Yes Historical Provider, MD  ciprofloxacin (CIPRO) 500 MG tablet Take 1 tablet (500 mg total) by mouth 2 (two) times daily. 12/27/13 01/06/14 Yes Maryann Mikhail, DO  fish oil-omega-3 fatty acids 1000 MG capsule Take 1 g by mouth daily.   Yes Historical Provider, MD  LORazepam (ATIVAN) 0.5 MG tablet Take 0.25 mg by mouth at bedtime as needed for anxiety or sleep.   Yes Historical Provider, MD  metoprolol (LOPRESSOR) 50 MG tablet Take 0.5 tablets (25 mg total) by mouth 2 (two) times daily. 08/18/13  Yes Peter M Martinique, MD  metroNIDAZOLE (FLAGYL) 500 MG tablet Take 1 tablet (500 mg total) by mouth 3 (three) times daily. 12/27/13  Yes Maryann Mikhail, DO  mometasone (NASONEX) 50 MCG/ACT nasal spray Place 2 sprays  into the nose daily as needed (for allergies).   Yes Historical Provider, MD  Multiple Vitamin (MULTIVITAMIN) capsule Take 1 capsule by mouth daily.   Yes Historical Provider, MD  Naproxen Sodium (ALEVE PO) Take 1 tablet by mouth daily as needed (for pain).   Yes Historical Provider, MD  pantoprazole (PROTONIX) 40 MG tablet Take 1 tablet (40 mg total) by mouth daily. 12/27/13  Yes Maryann Mikhail, DO  psyllium (HYDROCIL/METAMUCIL) 95 % PACK Take 1 packet by mouth at bedtime.   Yes Historical Provider, MD  vitamin C (ASCORBIC ACID) 500 MG tablet Take 500 mg by mouth  daily.   Yes Historical Provider, MD   BP 157/65  Pulse 75  Temp(Src) 98 F (36.7 C) (Oral)  Resp 20  SpO2 99% Physical Exam  Nursing note and vitals reviewed. Constitutional: She appears well-developed and well-nourished.  HENT:  Head: Normocephalic and atraumatic.  Eyes: Conjunctivae are normal. Pupils are equal, round, and reactive to light.  Neck: Neck supple. No tracheal deviation present. No thyromegaly present.  Cardiovascular: Normal rate and regular rhythm.   No murmur heard. Pulmonary/Chest: Effort normal and breath sounds normal.  Abdominal: Soft. Bowel sounds are normal. She exhibits no distension. There is no tenderness.  Musculoskeletal: Normal range of motion. She exhibits no edema and no tenderness.  Neurological: She is alert. Coordination normal.  Skin: Skin is warm and dry. No rash noted.  Psychiatric: She has a normal mood and affect.    ED Course  Procedures (including critical care time) Labs Review Labs Reviewed  BASIC METABOLIC PANEL - Abnormal; Notable for the following:    Sodium 136 (*)    GFR calc non Af Amer 58 (*)    GFR calc Af Amer 67 (*)    All other components within normal limits  CBC  PROTIME-INR  I-STAT TROPOININ, ED    Imaging Review Dg Chest 2 View  01/03/2014   CLINICAL DATA:  Jaw pain  EXAM: CHEST - 2 VIEW  COMPARISON:  DG CHEST 2 VIEW dated 03/09/2013  FINDINGS: Mild cardiomegaly. Normal vascularity. Chronic pleural changes at the lung apices. Hyperaeration and bronchitic changes. Minimal linear atelectasis for scar at the left base.  IMPRESSION: Cardiomegaly without decompensation.   Electronically Signed   By: Maryclare Bean M.D.   On: 01/03/2014 19:03     EKG Interpretation   Date/Time:  Tuesday January 03 2014 17:50:45 EDT Ventricular Rate:  75 PR Interval:  170 QRS Duration: 82 QT Interval:  408 QTC Calculation: 455 R Axis:   70 Text Interpretation:  Normal sinus rhythm Normal ECG No significant change  since last tracing  Confirmed by Winfred Leeds  MD, Kaan Tosh [phone]) on 01/03/2014  6:58:20 PM     Chest xray viewed by me Results for orders placed during the hospital encounter of 01/03/14  CBC      Result Value Ref Range   WBC 10.1  4.0 - 10.5 K/uL   RBC 4.36  3.87 - 5.11 MIL/uL   Hemoglobin 13.3  12.0 - 15.0 g/dL   HCT 39.7  36.0 - 46.0 %   MCV 91.1  78.0 - 100.0 fL   MCH 30.5  26.0 - 34.0 pg   MCHC 33.5  30.0 - 36.0 g/dL   RDW 14.3  11.5 - 15.5 %   Platelets 381  150 - 400 K/uL  BASIC METABOLIC PANEL      Result Value Ref Range   Sodium 136 (*) 137 - 147 mEq/L  Potassium 4.2  3.7 - 5.3 mEq/L   Chloride 98  96 - 112 mEq/L   CO2 25  19 - 32 mEq/L   Glucose, Bld 97  70 - 99 mg/dL   BUN 11  6 - 23 mg/dL   Creatinine, Ser 0.95  0.50 - 1.10 mg/dL   Calcium 9.3  8.4 - 10.5 mg/dL   GFR calc non Af Amer 58 (*) >90 mL/min   GFR calc Af Amer 67 (*) >90 mL/min  PROTIME-INR      Result Value Ref Range   Prothrombin Time 14.8  11.6 - 15.2 seconds   INR 1.19  0.00 - 1.49  I-STAT TROPOININ, ED      Result Value Ref Range   Troponin i, poc 0.00  0.00 - 0.08 ng/mL   Comment 3            Dg Chest 2 View  01/03/2014   CLINICAL DATA:  Jaw pain  EXAM: CHEST - 2 VIEW  COMPARISON:  DG CHEST 2 VIEW dated 03/09/2013  FINDINGS: Mild cardiomegaly. Normal vascularity. Chronic pleural changes at the lung apices. Hyperaeration and bronchitic changes. Minimal linear atelectasis for scar at the left base.  IMPRESSION: Cardiomegaly without decompensation.   Electronically Signed   By: Maryclare Bean M.D.   On: 01/03/2014 19:03   Ct Abdomen Pelvis W Contrast  12/23/2013   CLINICAL DATA:  Low abdominal pain radiating to the upper abdomen. Fever and constipation.  EXAM: CT ABDOMEN AND PELVIS WITH CONTRAST  TECHNIQUE: Multidetector CT imaging of the abdomen and pelvis was performed using the standard protocol following bolus administration of intravenous contrast.  CONTRAST:  59mL OMNIPAQUE IOHEXOL 300 MG/ML  SOLN  COMPARISON:  None.   FINDINGS: Motion artifact limits visualization of the lung bases. There appears to be dependent atelectasis and scarring present with small pneumatoceles, likely emphysema. Calcification in the mitral valve annulus. Small esophageal hiatal hernia.  Small collections of free intra-abdominal air are demonstrated in the upper abdomen and upper mesenteric. I see no indication of recent surgery. This indicates bowel perforation. No free contrast material is demonstrated. There is a focal thickening of wall of pylorus of the stomach which may indicate peptic ulcer disease. In addition, there is evidence of diverticulitis in the sigmoid colon with infiltration in the pericolonic fat and with gas in the pericolonic mesenteric. This is likely to be the source of perforation.  Cholelithiasis. Aortic calcification without aneurysm. Liver, spleen, adrenal glands, kidneys, inferior vena cava, and retroperitoneal lymph nodes are unremarkable.  Pelvis: Bladder is decompressed. The uterus and ovaries do not appear enlarged. No significant pelvic lymphadenopathy. Degenerative changes in the lumbar spine. No destructive bone lesions appreciated.  IMPRESSION: Perforated diverticulitis with gas and inflammatory infiltration around the sigmoid colon. In addition, there is free intra-abdominal air extending to the upper abdomen. Nonspecific thickening of the pylorus of the stomach may also indicate peptic ulcer disease. Cholelithiasis.  Results were telephoned to Dr. Cathi Roan in the emergency department at 2326 hours on 12/23/2013.   Electronically Signed   By: Lucienne Capers M.D.   On: 12/23/2013 23:28   Patient resting comfortable at 9 PM. Results for orders placed during the hospital encounter of 01/03/14  CBC      Result Value Ref Range   WBC 10.1  4.0 - 10.5 K/uL   RBC 4.36  3.87 - 5.11 MIL/uL   Hemoglobin 13.3  12.0 - 15.0 g/dL   HCT 39.7  36.0 - 46.0 %  MCV 91.1  78.0 - 100.0 fL   MCH 30.5  26.0 - 34.0 pg   MCHC 33.5   30.0 - 36.0 g/dL   RDW 14.3  11.5 - 15.5 %   Platelets 381  150 - 400 K/uL  BASIC METABOLIC PANEL      Result Value Ref Range   Sodium 136 (*) 137 - 147 mEq/L   Potassium 4.2  3.7 - 5.3 mEq/L   Chloride 98  96 - 112 mEq/L   CO2 25  19 - 32 mEq/L   Glucose, Bld 97  70 - 99 mg/dL   BUN 11  6 - 23 mg/dL   Creatinine, Ser 0.95  0.50 - 1.10 mg/dL   Calcium 9.3  8.4 - 10.5 mg/dL   GFR calc non Af Amer 58 (*) >90 mL/min   GFR calc Af Amer 67 (*) >90 mL/min  PROTIME-INR      Result Value Ref Range   Prothrombin Time 14.8  11.6 - 15.2 seconds   INR 1.19  0.00 - 1.49  I-STAT TROPOININ, ED      Result Value Ref Range   Troponin i, poc 0.00  0.00 - 0.08 ng/mL   Comment 3           I-STAT TROPOININ, ED      Result Value Ref Range   Troponin i, poc 0.01  0.00 - 0.08 ng/mL   Comment 3            Dg Chest 2 View  01/03/2014   CLINICAL DATA:  Jaw pain  EXAM: CHEST - 2 VIEW  COMPARISON:  DG CHEST 2 VIEW dated 03/09/2013  FINDINGS: Mild cardiomegaly. Normal vascularity. Chronic pleural changes at the lung apices. Hyperaeration and bronchitic changes. Minimal linear atelectasis for scar at the left base.  IMPRESSION: Cardiomegaly without decompensation.   Electronically Signed   By: Maryclare Bean M.D.   On: 01/03/2014 19:03   Ct Abdomen Pelvis W Contrast  12/23/2013   CLINICAL DATA:  Low abdominal pain radiating to the upper abdomen. Fever and constipation.  EXAM: CT ABDOMEN AND PELVIS WITH CONTRAST  TECHNIQUE: Multidetector CT imaging of the abdomen and pelvis was performed using the standard protocol following bolus administration of intravenous contrast.  CONTRAST:  44mL OMNIPAQUE IOHEXOL 300 MG/ML  SOLN  COMPARISON:  None.  FINDINGS: Motion artifact limits visualization of the lung bases. There appears to be dependent atelectasis and scarring present with small pneumatoceles, likely emphysema. Calcification in the mitral valve annulus. Small esophageal hiatal hernia.  Small collections of free  intra-abdominal air are demonstrated in the upper abdomen and upper mesenteric. I see no indication of recent surgery. This indicates bowel perforation. No free contrast material is demonstrated. There is a focal thickening of wall of pylorus of the stomach which may indicate peptic ulcer disease. In addition, there is evidence of diverticulitis in the sigmoid colon with infiltration in the pericolonic fat and with gas in the pericolonic mesenteric. This is likely to be the source of perforation.  Cholelithiasis. Aortic calcification without aneurysm. Liver, spleen, adrenal glands, kidneys, inferior vena cava, and retroperitoneal lymph nodes are unremarkable.  Pelvis: Bladder is decompressed. The uterus and ovaries do not appear enlarged. No significant pelvic lymphadenopathy. Degenerative changes in the lumbar spine. No destructive bone lesions appreciated.  IMPRESSION: Perforated diverticulitis with gas and inflammatory infiltration around the sigmoid colon. In addition, there is free intra-abdominal air extending to the upper abdomen. Nonspecific thickening of the pylorus of the  stomach may also indicate peptic ulcer disease. Cholelithiasis.  Results were telephoned to Dr. Cathi Roan in the emergency department at 2326 hours on 12/23/2013.   Electronically Signed   By: Lucienne Capers M.D.   On: 12/23/2013 23:28   Chest xray viewed by me MDM   Final diagnoses:  None   Case discussed with Dr Kennith Center story is atypical for acute coronary syndrome. She has 2 negative troponins normal EKG. We will arrange for close outpatient followup with cardiology. Heart score equals 3 patient prefers to go home. She was offered an inpatient hospital stay Diagnosis #1 jaw pain 2 atypical chest pain    Orlie Dakin, MD 01/03/14 2107

## 2014-01-03 NOTE — ED Notes (Signed)
Phlebotomy at bedside.

## 2014-01-03 NOTE — ED Notes (Addendum)
Pt c/o bilateral jaw pain that started around 11am today, was intermittent but has become constant. sts she feels "foggy" in her head. sts she went to the fire station and was told her BP was elevated. Pt sts she was just d/c from hospital on Tuesday, sts she was admitted for diverticulitis. sts recent MI and increased stress in her life. Pt denies CP/sob. Pt reports she also feels "jittering", reports she thinks she has some anxiety. Hx of afib and NSTEMI, currently takes Eliquis. Nad, skin warm and dry, resp e/u.

## 2014-01-03 NOTE — Discharge Instructions (Signed)
Chest Pain (Nonspecific) Dr. Doug Sou office will call you to schedule an office visit. If you don't hear from the office by tomorrow afternoon call to schedule the appointment. Tell the office staff that you were seen here and that [place] spoke with Dr. Kennith Center about your case Chest pain has many causes. Your pain could be caused by something serious, such as a heart attack or a blood clot in the lungs. It could also be caused by something less serious, such as a chest bruise or a virus. Follow up with your doctor. More lab tests or other studies may be needed to find the cause of your pain. Most of the time, nonspecific chest pain will improve within 2 to 3 days of rest and mild pain medicine. HOME CARE  For chest bruises, you may put ice on the sore area for 15-20 minutes, 03-04 times a day. Do this only if it makes you feel better.  Put ice in a plastic bag.  Place a towel between the skin and the bag.  Rest for the next 2 to 3 days.  Go back to work if the pain improves.  See your doctor if the pain lasts longer than 1 to 2 weeks.  Only take medicine as told by your doctor.  Quit smoking if you smoke. GET HELP RIGHT AWAY IF:   There is more pain or pain that spreads to the arm, neck, jaw, back, or belly (abdomen).  You have shortness of breath.  You cough more than usual or cough up blood.  You have very bad back or belly pain, feel sick to your stomach (nauseous), or throw up (vomit).  You have very bad weakness.  You pass out (faint).  You have a fever. Any of these problems may be serious and may be an emergency. Do not wait to see if the problems will go away. Get medical help right away. Call your local emergency services 911 in [place] Do not drive yourself to the hospital. MAKE SURE YOU:   Understand these instructions.  Will watch this condition.  Will get help right away if you or your child is not doing well or gets worse. Document Released: 02/18/2008  Document Revised: 11/24/2011 Document Reviewed: 02/18/2008 [facility] Patient Information 2014 [place], [place].

## 2014-01-04 ENCOUNTER — Telehealth: Payer: Self-pay | Admitting: Cardiology

## 2014-01-04 MED ORDER — METOPROLOL TARTRATE 50 MG PO TABS: 50.0000 mg | ORAL_TABLET | Freq: Two times a day (BID) | ORAL | Status: DC

## 2014-01-04 NOTE — Telephone Encounter (Signed)
Returned call to patient.She stated she went to [facility] ER last night.Stated she was having jaw pain,shoulder pain. No pain today.Stated her husband died this past 2013-10-27 and she has been under a lot of stress.Stated B/P has been elevated 169/85,139/74,pulse 75,68,wanted to know if lopressor needs to be increased.Takes 50 mg 1/2 tablet twice a day.Will check with Dr.Jordan this afternoon and call her back.

## 2014-01-04 NOTE — Telephone Encounter (Signed)
New message   Patient was seen in the hospital for atypical chest pain. Was told to follow up with Dr Martinique .    appt is made for her on  4/28 with Cecille Rubin G @ 3pm .   Blood pressure this am was 138/54 . Yesterday at the fire station 169/80   Asking for a called back .

## 2014-01-04 NOTE — Telephone Encounter (Signed)
Returned call to patient Dr.Jordan advised ok to increase lopressor to 50 mg twice a day.Advised to monitor pulse and B/P and bring readings to Truitt Merle NP appointment.

## 2014-01-09 ENCOUNTER — Ambulatory Visit
Admission: RE | Admit: 2014-01-09 | Discharge: 2014-01-09 | Disposition: A | Discharge status: Home / Self Care | Payer: Medicare Other | Source: Ambulatory Visit

## 2014-01-09 DIAGNOSIS — Z1231 Encounter for screening mammogram for malignant neoplasm of breast: Secondary | ICD-10-CM

## 2014-01-10 ENCOUNTER — Encounter: Payer: Self-pay | Admitting: Nurse Practitioner

## 2014-01-10 ENCOUNTER — Ambulatory Visit (INDEPENDENT_AMBULATORY_CARE_PROVIDER_SITE_OTHER): Payer: Medicare Other | Admitting: Nurse Practitioner

## 2014-01-10 VITALS — BP 120/70 | HR 71 | Ht 63.0 in | Wt 153.4 lb

## 2014-01-10 DIAGNOSIS — R6884 Jaw pain: Secondary | ICD-10-CM

## 2014-01-10 DIAGNOSIS — I4891 Unspecified atrial fibrillation: Secondary | ICD-10-CM

## 2014-01-10 DIAGNOSIS — I10 Essential (primary) hypertension: Secondary | ICD-10-CM

## 2014-01-10 NOTE — Patient Instructions (Addendum)
I think you are doing ok  Remember what we have talked about today  Continue to monitor your blood pressure at home - bring a new list for Dr. Martinique to look at  Stay active  See Dr.Jordan in [place]  Call the [place] office at [phone] if you have any questions, problems or concerns.

## 2014-01-10 NOTE — Progress Notes (Signed)
Kelli [name] Date of Birth: [birth year] Medical Record #[id number]  History of Present Illness: Kelli [name] is seen back today for a post ER visit. Seen for Kelli [name]. She is a 73 year old female with HTN, CAD with past NSTEMI in Kelli [name] of 2014 due to demand ischemic from atrial fib - on Eliquis. Echo from Kelli [name] of 2014 with EF of 55 to 60% and grade 1 diastolic dysfunction. Normal Myoview from Kelli [name] of 2014.   Last seen by Kelli [name] back in December and was felt to be doing ok.   Husband died in 26-Oct-2013.   Most recently in the hospital with lower abdominal pain - found to have diverticulitis with microperforation - treated with bowel rest, antibiotics and supportive care. Went home on antibiotics for 10 days. Was going to need an outpatient colonoscopy and GI follow up.   Presented back to the ER last week with bilateral jaw pain - had 2 negative POC troponins. Also with pain in her neck that was worse with flexion. BP was up.  Was asked to follow up here for further evaluation. Kelli [name] did increase her metoprolol last week after that visit.   Comes in today. Here alone.  She says she is doing about as well as can be expected since her husband died 2 months ago. No more jaw pain. Does have issues with her neck from arthritis. No chest pain. Has had no symptoms with exertion. She continues to work part time and is walking 3 to 4 times a week. BP is fair at home. Occasional palpitation. Remains on her Eliquis. Has some trouble sleeping and will use Ativan prn.   Current Outpatient Prescriptions  Medication Sig Dispense Refill  . acetaminophen (TYLENOL) 500 MG tablet Take 500 mg by mouth daily as needed for pain.      [name] apixaban (ELIQUIS) 5 MG TABS tablet Take 1 tablet (5 mg total) by mouth 2 (two) times daily.  180 tablet  3  . Calcium Carb-Cholecalciferol (CALCIUM + D3) 600-200 MG-UNIT TABS Take 1 tablet by mouth daily.      . Cholecalciferol (VITAMIN D) 400 UNITS capsule Take 400  Units by mouth daily.      . fish oil-omega-3 fatty acids 1000 MG capsule Take 1 g by mouth daily.      [name] LORazepam (ATIVAN) 0.5 MG tablet Take 0.25 mg by mouth at bedtime as needed for anxiety or sleep.      . metoprolol (LOPRESSOR) 50 MG tablet Take 1 tablet (50 mg total) by mouth 2 (two) times daily.  60 tablet  6  . mometasone (NASONEX) 50 MCG/ACT nasal spray Place 2 sprays into the nose daily as needed (for allergies).      . Multiple Vitamin (MULTIVITAMIN) capsule Take 1 capsule by mouth daily.      . Naproxen Sodium (ALEVE PO) Take 1 tablet by mouth daily as needed (for pain).      . pantoprazole (PROTONIX) 40 MG tablet Take 1 tablet (40 mg total) by mouth daily.  30 tablet  0  . psyllium (HYDROCIL/METAMUCIL) 95 % PACK Take 1 packet by mouth at bedtime.      . vitamin C (ASCORBIC ACID) 500 MG tablet Take 500 mg by mouth daily.       No current facility-administered medications for this visit.    Allergies  Allergen Reactions  . Augmentin [Amoxicillin-Pot Clavulanate] Itching and Rash    Past Medical History  Diagnosis Date  .  Hypertension   . NSTEMI (non-ST elevated myocardial infarction) 02/2013    demand ischemia in setting of AFib with RVR; ETT-Myoview 03/11/13: No ischemia or scar, EF 70%.  . Atrial fibrillation 02/2013    paroxsymal; c/b Type 2 NSTEMI; CHADS2-VASc=2 (age, sex - patient had come off anti-HTN meds in past); Eliquis started;  Echocardiogram 03/10/13: EF 83-15%, grade 1 diastolic dysfunction, mild MR, mild LAE    Past Surgical History  Procedure Laterality Date  . Eye surgery    . Cataract extraction, bilateral    . Appendectomy    . Tonsillectomy      History  Smoking status  . Never Smoker   Smokeless tobacco  . Never Used    History  Alcohol Use No    No family history on file.  Review of Systems: The review of systems is per the HPI.  Her husband has recently died. All other systems were reviewed and are negative.  Physical Exam: BP 120/70   Pulse 71  Ht 5\' 3"  (1.6 m)  Wt 153 lb 6.4 oz (69.582 kg)  BMI 27.18 kg/m2  SpO2 99% Patient is very pleasant and in no acute distress. Little tearful at times. Skin is warm and dry. Color is normal.  HEENT is unremarkable. Normocephalic/atraumatic. PERRL. Sclera are nonicteric. Neck is supple. No masses. No JVD. Lungs are clear. Cardiac exam shows a regular rate and rhythm. Abdomen is soft. Extremities are without edema. Gait and ROM are intact. No gross neurologic deficits noted.  LABORATORY DATA:  Lab Results  Component Value Date   WBC 10.1 01/03/2014   HGB 13.3 01/03/2014   HCT 39.7 01/03/2014   PLT 381 01/03/2014   GLUCOSE 97 01/03/2014   CHOL 201* 03/10/2013   TRIG 70 03/10/2013   HDL 57 03/10/2013   LDLCALC 130* 03/10/2013   ALT 15 12/24/2013   AST 20 12/24/2013   NA 136* 01/03/2014   K 4.2 01/03/2014   CL 98 01/03/2014   CREATININE 0.95 01/03/2014   BUN 11 01/03/2014   CO2 25 01/03/2014   TSH 1.168 03/09/2013   INR 1.19 01/03/2014   HGBA1C 5.4 03/09/2013   Echo Study Conclusions from Kelli [name] 2014  - Left ventricle: The cavity size was normal. Wall thickness was normal. Systolic function was normal. The estimated ejection fraction was in the range of 55% to 60%. Regional wall motion abnormalities cannot be excluded. Doppler parameters are consistent with abnormal left ventricular relaxation (grade 1 diastolic dysfunction). Doppler parameters are consistent with high ventricular filling pressure. - Mitral valve: Calcified annulus. Mild regurgitation. - Left atrium: The atrium was mildly dilated.   Stress Myoview from Kelli [name] 2014 Stress data: The patient exercised in the Bruce protocol. Baseline EKG showed sinus rhythm 68 beats per minute baseline blood pressure 133/67  The patient exercised for 8 minutes 59 seconds to a peak heart rate of 133 which was 89% predicted maximal peak blood pressure 241/47 consistent with hypertensive response. She complained of no chest pain EKG  showed no changes to suggest ischemia  Nuclear data: The patient was studied in 1-day rest stress protocol she was injected with 10 mCi technetium 99 labeled sestamibi at rest, 30 mCi technetium 99 labeled sestamibi at stress images were reconstructed the short, vertical, horizontal axes.  In the initial stress images there is overall normal perfusion.  In the recovery images there is mild thinning in the inferolateral wall.  On gating LVEF was calculated at greater than 70%.  Review of the raw  data the inferolateral changes at rest most likely reflects soft tissue (diaphragm)  Impression: Overall normal perfusion. No evidence for ischemia or scar. LVEF on gating calculated greater than 70%.   Original Report Authenticated By: Marijo Sanes, M.D.   Assessment / Plan: 1. Jaw pain - negative ER evaluation - no exertional symptoms. Normal Myoview just 10 months ago. Will keep her on her current regimen for now. If she has further symptoms, she will need further testing.   2. PAF - remains in sinus. Metoprolol just increased.   3. Recent diverticulitis/microperforation  4. Chronic anticoagulation  5. HTN - BP stable at home. I have left her on her current regimen. She will continue to monitor.   See her back for her regular visit with Kelli [name] in [place].   Patient is agreeable to this plan and will call if any problems develop in the interim.   Burtis Junes, RN, [facility] [address] [place], [place]  [address] [phone]

## 2014-01-16 ENCOUNTER — Encounter (INDEPENDENT_AMBULATORY_CARE_PROVIDER_SITE_OTHER): Payer: Self-pay | Admitting: General Surgery

## 2014-01-16 ENCOUNTER — Ambulatory Visit (INDEPENDENT_AMBULATORY_CARE_PROVIDER_SITE_OTHER): Payer: Medicare Other | Admitting: General Surgery

## 2014-01-16 VITALS — BP 122/74 | HR 73 | Temp 98.4°F | Resp 14 | Ht 63.0 in | Wt 153.0 lb

## 2014-01-16 DIAGNOSIS — K5732 Diverticulitis of large intestine without perforation or abscess without bleeding: Secondary | ICD-10-CM

## 2014-01-16 NOTE — Progress Notes (Signed)
Subjective:     Patient ID: Kelli [name], female   DOB: 05/03/1941, 73 y.o.   MRN: [id number]  HPI The patient is a 73 year old female with a recent diagnosis of diverticulitis. Patient was hospitalized for several days underwent antibiotic treatment. subsequently this resolved. The patient saw Dr. Cristina Gong he was referred to Dr. Penelope Coop for posthospitalization colonoscopy  The patient she currently has no abdominal pain. She's had no diarrhea or fever.  Review of Systems  Constitutional: Negative.   HENT: Negative.   Respiratory: Negative.   Cardiovascular: Negative.   Gastrointestinal: Negative.   Neurological: Negative.   All other systems reviewed and are negative.      Objective:   Physical Exam  Constitutional: She is oriented to person, place, and time. She appears well-developed and well-nourished.  HENT:  Head: Normocephalic and atraumatic.  Eyes: Conjunctivae and EOM are normal. Pupils are equal, round, and reactive to light.  Neck: Normal range of motion. Neck supple.  Cardiovascular: Normal rate, regular rhythm and normal heart sounds.   Pulmonary/Chest: Effort normal and breath sounds normal.  Abdominal: Soft. Bowel sounds are normal. She exhibits no distension and no mass. There is no tenderness. There is no rebound and no guarding.  Musculoskeletal: Normal range of motion.  Neurological: She is alert and oriented to person, place, and time.  Skin: Skin is warm and dry.  Psychiatric: She has a normal mood and affect.       Assessment:     73 year old female with her initial bout of diverticulitis     Plan:     1. Patient see Dr. Penelope Coop to schedule for colonoscopy.  2. I discussed the patient that there is a chance that she could have a second bout of diverticulitis. We'll also discuss her she could have her sigmoid colon electively resected to prevent any further diverticulitis. She understands the possibility of having a second bout of diverticulitis and will  revisit the possibility of surgery after her colonoscopy.

## 2014-01-27 ENCOUNTER — Telehealth: Payer: Self-pay | Admitting: Cardiology

## 2014-01-27 NOTE — Telephone Encounter (Signed)
Received request from Nurse fax box, documents faxed for surgical clearance. To: [facility]  Fax number: [phone] Attention: [phone]/[phone]

## 2014-03-10 ENCOUNTER — Ambulatory Visit (INDEPENDENT_AMBULATORY_CARE_PROVIDER_SITE_OTHER): Payer: Medicare Other | Admitting: Cardiology

## 2014-03-10 ENCOUNTER — Encounter: Payer: Self-pay | Admitting: Cardiology

## 2014-03-10 VITALS — BP 147/63 | HR 62 | Ht 63.0 in | Wt 150.8 lb

## 2014-03-10 DIAGNOSIS — I480 Paroxysmal atrial fibrillation: Secondary | ICD-10-CM

## 2014-03-10 DIAGNOSIS — I4891 Unspecified atrial fibrillation: Secondary | ICD-10-CM

## 2014-03-10 DIAGNOSIS — I10 Essential (primary) hypertension: Secondary | ICD-10-CM | POA: Insufficient documentation

## 2014-03-10 NOTE — Progress Notes (Signed)
Kelli [name] Date of Birth: [birth year] Medical Record #[id number]  History of Present Illness: Kelli [name] is seen for follow up.  She has a history of HTN, CAD with past NSTEMI in 2013-03-01 due to demand ischemic from atrial fib - on Eliquis. Echo from 03/01/13 with EF of 55 to 60% and grade 1 diastolic dysfunction. Normal Myoview from Kelli [name] 17, 2014.   Husband died in 01-Nov-2013 after long time heart disease.   Hospitalized in April 2015 with lower abdominal pain - found to have diverticulitis with microperforation - treated with bowel rest, antibiotics and supportive care. Colonoscopy planned in July.  On follow up today she is doing well. No chest pain or SOB. No dyspnea. No palpitations. BP at home has been normal.   Current Outpatient Prescriptions  Medication Sig Dispense Refill  . acetaminophen (TYLENOL) 500 MG tablet Take 500 mg by mouth daily as needed for pain.      [name] apixaban (ELIQUIS) 5 MG TABS tablet Take 1 tablet (5 mg total) by mouth 2 (two) times daily.  180 tablet  3  . Calcium Carb-Cholecalciferol (CALCIUM + D3) 600-200 MG-UNIT TABS Take 1 tablet by mouth daily.      . Cholecalciferol (VITAMIN D) 400 UNITS capsule Take 400 Units by mouth daily.      . fish oil-omega-3 fatty acids 1000 MG capsule Take 1 g by mouth daily.      [name] LORazepam (ATIVAN) 0.5 MG tablet Take 0.25 mg by mouth at bedtime as needed for anxiety or sleep.      . metoprolol (LOPRESSOR) 50 MG tablet Take 1 tablet (50 mg total) by mouth 2 (two) times daily.  60 tablet  6  . mometasone (NASONEX) 50 MCG/ACT nasal spray Place 2 sprays into the nose daily as needed (for allergies).      . Multiple Vitamin (MULTIVITAMIN) capsule Take 1 capsule by mouth daily.      . Naproxen Sodium (ALEVE PO) Take 1 tablet by mouth daily as needed (for pain).      . Pantoprazole Sodium (PROTONIX PO) Take by mouth as needed.      . psyllium (HYDROCIL/METAMUCIL) 95 % PACK Take 1 packet by mouth at bedtime.      .  vitamin C (ASCORBIC ACID) 500 MG tablet Take 500 mg by mouth daily.       No current facility-administered medications for this visit.    Allergies  Allergen Reactions  . Augmentin [Amoxicillin-Pot Clavulanate] Itching and Rash    Past Medical History  Diagnosis Date  . Hypertension   . NSTEMI (non-ST elevated myocardial infarction) Kelli [name] 17, 2014    demand ischemia in setting of AFib with RVR; ETT-Myoview 03/11/13: No ischemia or scar, EF 70%.  . Atrial fibrillation Kelli [name] 17, 2014    paroxsymal; c/b Type 2 NSTEMI; CHADS2-VASc=2 (age, sex - patient had come off anti-HTN meds in past); Eliquis started;  Echocardiogram 03/10/13: EF 49-70%, grade 1 diastolic dysfunction, mild MR, mild LAE    Past Surgical History  Procedure Laterality Date  . Eye surgery    . Cataract extraction, bilateral    . Appendectomy    . Tonsillectomy      History  Smoking status  . Never Smoker   Smokeless tobacco  . Never Used    History  Alcohol Use No    History reviewed. No pertinent family history.  Review of Systems: The review of systems is per the HPI.  Her husband has recently  died. All other systems were reviewed and are negative.  Physical Exam: BP 147/63  Pulse 62  Ht 5\' 3"  (1.6 m)  Wt 150 lb 12.8 oz (68.402 kg)  BMI 26.72 kg/m2 Patient is very pleasant and in no acute distress. Little tearful at times. Skin is warm and dry. Color is normal.  HEENT is unremarkable. Normocephalic/atraumatic. PERRL. Sclera are nonicteric. Neck is supple. No masses. No JVD. Lungs are clear. Cardiac exam shows a regular rate and rhythm. Normal S1-2. No gallop or murmur. Abdomen is soft. Extremities are without edema. Gait and ROM are intact. No gross neurologic deficits noted.  LABORATORY DATA:  Lab Results  Component Value Date   WBC 10.1 01/03/2014   HGB 13.3 01/03/2014   HCT 39.7 01/03/2014   PLT 381 01/03/2014   GLUCOSE 97 01/03/2014   CHOL 201* 03/10/2013   TRIG 70 03/10/2013   HDL 57 03/10/2013   LDLCALC 130*  03/10/2013   ALT 15 12/24/2013   AST 20 12/24/2013   NA 136* 01/03/2014   K 4.2 01/03/2014   CL 98 01/03/2014   CREATININE 0.95 01/03/2014   BUN 11 01/03/2014   CO2 25 01/03/2014   TSH 1.168 03/09/2013   INR 1.19 01/03/2014   HGBA1C 5.4 03/09/2013   Echo Study Conclusions from Kelli [name] 2014  - Left ventricle: The cavity size was normal. Wall thickness was normal. Systolic function was normal. The estimated ejection fraction was in the range of 55% to 60%. Regional wall motion abnormalities cannot be excluded. Doppler parameters are consistent with abnormal left ventricular relaxation (grade 1 diastolic dysfunction). Doppler parameters are consistent with high ventricular filling pressure. - Mitral valve: Calcified annulus. Mild regurgitation. - Left atrium: The atrium was mildly dilated.   Stress Myoview from Kelli [name] 2014 Stress data: The patient exercised in the Bruce protocol. Baseline EKG showed sinus rhythm 68 beats per minute baseline blood pressure 133/67  The patient exercised for 8 minutes 59 seconds to a peak heart rate of 133 which was 89% predicted maximal peak blood pressure 241/47 consistent with hypertensive response. She complained of no chest pain EKG showed no changes to suggest ischemia  Nuclear data: The patient was studied in 1-day rest stress protocol she was injected with 10 mCi technetium 99 labeled sestamibi at rest, 30 mCi technetium 99 labeled sestamibi at stress images were reconstructed the short, vertical, horizontal axes.  In the initial stress images there is overall normal perfusion.  In the recovery images there is mild thinning in the inferolateral wall.  On gating LVEF was calculated at greater than 70%.  Review of the raw data the inferolateral changes at rest most likely reflects soft tissue (diaphragm)  Impression: Overall normal perfusion. No evidence for ischemia or scar. LVEF on gating calculated greater than 70%.   Original Report  Authenticated By: Marijo Sanes, M.D.   Assessment / Plan: 1.  PAF - remains in sinus. Continue metoprolol and Eliquis. Ok to stop Eliquis 3 days before colonoscopy.   2. History of diverticulitis/microperforation  3. Chronic anticoagulation  4. HTN - BP stable at home. I have left her on her current regimen. She will continue to monitor.   I will follow up in 6 months.

## 2014-03-10 NOTE — Patient Instructions (Signed)
Continue your current therapy  Take Mucinex for cough.  I will see you in 6 months.

## 2014-03-22 ENCOUNTER — Other Ambulatory Visit: Payer: Self-pay | Admitting: *Deleted

## 2014-03-22 MED ORDER — METOPROLOL TARTRATE 50 MG PO TABS: 50.0000 mg | ORAL_TABLET | Freq: Two times a day (BID) | ORAL | Status: DC

## 2014-03-22 MED ORDER — APIXABAN 5 MG PO TABS: 5.0000 mg | ORAL_TABLET | Freq: Two times a day (BID) | ORAL | Status: DC

## 2014-09-18 DIAGNOSIS — M9902 Segmental and somatic dysfunction of thoracic region: Secondary | ICD-10-CM | POA: Diagnosis not present

## 2014-09-18 DIAGNOSIS — M9901 Segmental and somatic dysfunction of cervical region: Secondary | ICD-10-CM | POA: Diagnosis not present

## 2014-09-18 DIAGNOSIS — S233XXA Sprain of ligaments of thoracic spine, initial encounter: Secondary | ICD-10-CM | POA: Diagnosis not present

## 2014-09-18 DIAGNOSIS — M9905 Segmental and somatic dysfunction of pelvic region: Secondary | ICD-10-CM | POA: Diagnosis not present

## 2014-09-18 DIAGNOSIS — M542 Cervicalgia: Secondary | ICD-10-CM | POA: Diagnosis not present

## 2014-09-23 ENCOUNTER — Other Ambulatory Visit: Payer: Self-pay | Admitting: Cardiology

## 2014-09-25 DIAGNOSIS — E780 Pure hypercholesterolemia: Secondary | ICD-10-CM | POA: Diagnosis not present

## 2014-09-27 DIAGNOSIS — I4891 Unspecified atrial fibrillation: Secondary | ICD-10-CM | POA: Diagnosis not present

## 2014-09-27 DIAGNOSIS — E780 Pure hypercholesterolemia: Secondary | ICD-10-CM | POA: Diagnosis not present

## 2014-09-29 DIAGNOSIS — H53033 Strabismic amblyopia, bilateral: Secondary | ICD-10-CM | POA: Diagnosis not present

## 2014-09-29 DIAGNOSIS — H40013 Open angle with borderline findings, low risk, bilateral: Secondary | ICD-10-CM | POA: Diagnosis not present

## 2014-09-29 DIAGNOSIS — H43393 Other vitreous opacities, bilateral: Secondary | ICD-10-CM | POA: Diagnosis not present

## 2014-09-29 DIAGNOSIS — H524 Presbyopia: Secondary | ICD-10-CM | POA: Diagnosis not present

## 2014-09-29 DIAGNOSIS — Z961 Presence of intraocular lens: Secondary | ICD-10-CM | POA: Diagnosis not present

## 2014-10-03 DIAGNOSIS — M5417 Radiculopathy, lumbosacral region: Secondary | ICD-10-CM | POA: Diagnosis not present

## 2014-10-03 DIAGNOSIS — M9901 Segmental and somatic dysfunction of cervical region: Secondary | ICD-10-CM | POA: Diagnosis not present

## 2014-10-03 DIAGNOSIS — M545 Low back pain: Secondary | ICD-10-CM | POA: Diagnosis not present

## 2014-10-03 DIAGNOSIS — M9905 Segmental and somatic dysfunction of pelvic region: Secondary | ICD-10-CM | POA: Diagnosis not present

## 2014-10-03 DIAGNOSIS — M9903 Segmental and somatic dysfunction of lumbar region: Secondary | ICD-10-CM | POA: Diagnosis not present

## 2014-10-16 ENCOUNTER — Encounter: Payer: Self-pay | Admitting: Cardiology

## 2014-10-16 ENCOUNTER — Ambulatory Visit (INDEPENDENT_AMBULATORY_CARE_PROVIDER_SITE_OTHER): Payer: Medicare Other | Admitting: Cardiology

## 2014-10-16 VITALS — BP 150/72 | HR 80 | Ht 63.0 in | Wt 161.8 lb

## 2014-10-16 DIAGNOSIS — I480 Paroxysmal atrial fibrillation: Secondary | ICD-10-CM | POA: Diagnosis not present

## 2014-10-16 DIAGNOSIS — M5417 Radiculopathy, lumbosacral region: Secondary | ICD-10-CM | POA: Diagnosis not present

## 2014-10-16 DIAGNOSIS — M545 Low back pain: Secondary | ICD-10-CM | POA: Diagnosis not present

## 2014-10-16 DIAGNOSIS — M9905 Segmental and somatic dysfunction of pelvic region: Secondary | ICD-10-CM | POA: Diagnosis not present

## 2014-10-16 DIAGNOSIS — I10 Essential (primary) hypertension: Secondary | ICD-10-CM | POA: Diagnosis not present

## 2014-10-16 DIAGNOSIS — M9903 Segmental and somatic dysfunction of lumbar region: Secondary | ICD-10-CM | POA: Diagnosis not present

## 2014-10-16 NOTE — Patient Instructions (Signed)
Continue your current therapy  Try and lose weight and exercise more.  I will see you in one year.

## 2014-10-16 NOTE — Progress Notes (Signed)
Kelli [name] Date of Birth: 14-Sep-1941 Medical Record #[id number]  History of Present Illness: Kelli [name] is seen for follow up of atrial fibrillation.  She has a history of HTN, CAD with past NSTEMI in 19-Mar-2013 due to demand ischemic from atrial fib - on Eliquis. Echo from 2013/03/19 with EF of 55 to 60% and grade 1 diastolic dysfunction. Normal Myoview from Mar 19, 2013.   Husband died in 11-17-13 after long time heart disease.   On follow up today she is doing well. No chest pain or SOB. No dyspnea. No palpitations. BP at home has been normal. She was started on atorvastatin by Dr. Shelia Media with excellent results. She had a colonoscopy this past summer that was clear.   Current Outpatient Prescriptions  Medication Sig Dispense Refill  . acetaminophen (TYLENOL) 500 MG tablet Take 500 mg by mouth daily as needed for pain.    [name] atorvastatin (LIPITOR) 10 MG tablet     . Calcium Carb-Cholecalciferol (CALCIUM + D3) 600-200 MG-UNIT TABS Take 1 tablet by mouth daily.    . Cholecalciferol (VITAMIN D) 400 UNITS capsule Take 400 Units by mouth daily.    [name] Calcium (STOOL SOFTENER PO) Take by mouth at bedtime.    [name] ELIQUIS 5 MG TABS tablet TAKE 1 TABLET BY MOUTH 2 TIMES DAILY. 180 tablet 1  . fish oil-omega-3 fatty acids 1000 MG capsule Take 1 g by mouth daily.    . metoprolol (LOPRESSOR) 50 MG tablet Take 1 tablet (50 mg total) by mouth 2 (two) times daily. 60 tablet 5  . mometasone (NASONEX) 50 MCG/ACT nasal spray Place 2 sprays into the nose daily as needed (for allergies).    . Multiple Vitamin (MULTIVITAMIN) capsule Take 1 capsule by mouth daily.    . Pantoprazole Sodium (PROTONIX PO) Take by mouth as needed.    . psyllium (HYDROCIL/METAMUCIL) 95 % PACK Take 1 packet by mouth at bedtime.    . vitamin C (ASCORBIC ACID) 500 MG tablet Take 500 mg by mouth daily.     No current facility-administered medications for this visit.    Allergies  Allergen Reactions  .  Augmentin [Amoxicillin-Pot Clavulanate] Itching and Rash    Past Medical History  Diagnosis Date  . Hypertension   . NSTEMI (non-ST elevated myocardial infarction) 03/19/2013    demand ischemia in setting of AFib with RVR; ETT-Myoview 03/11/13: No ischemia or scar, EF 70%.  . Atrial fibrillation Mar 19, 2013    paroxsymal; c/b Type 2 NSTEMI; CHADS2-VASc=2 (age, sex - patient had come off anti-HTN meds in past); Eliquis started;  Echocardiogram 03/10/13: EF 23-76%, grade 1 diastolic dysfunction, mild MR, mild LAE    Past Surgical History  Procedure Laterality Date  . Eye surgery    . Cataract extraction, bilateral    . Appendectomy    . Tonsillectomy      History  Smoking status  . Never Smoker   Smokeless tobacco  . Never Used    History  Alcohol Use No    History reviewed. No pertinent family history.  Review of Systems: The review of systems is per the HPI.  All other systems were reviewed and are negative.  Physical Exam: BP 150/72 mmHg  Pulse 80  Ht 5\' 3"  (1.6 m)  Wt 161 lb 12.8 oz (73.392 kg)  BMI 28.67 kg/m2  SpO2 97% Patient is very pleasant and in no acute distress.  Skin is warm and dry. Color is normal.  HEENT is unremarkable. Normocephalic/atraumatic. PERRL. Sclera are nonicteric. Neck is supple. No masses. No JVD. Lungs are clear. Cardiac exam shows a regular rate and rhythm. Normal S1-2. No gallop or murmur. Abdomen is soft. Extremities are without edema. Gait and ROM are intact. No gross neurologic deficits noted.  LABORATORY DATA:  Lab Results  Component Value Date   WBC 10.1 01/03/2014   HGB 13.3 01/03/2014   HCT 39.7 01/03/2014   PLT 381 01/03/2014   GLUCOSE 97 01/03/2014   CHOL 201* 03/10/2013   TRIG 70 03/10/2013   HDL 57 03/10/2013   LDLCALC 130* 03/10/2013   ALT 15 12/24/2013   AST 20 12/24/2013   NA 136* 01/03/2014   K 4.2 01/03/2014   CL 98 01/03/2014   CREATININE 0.95 01/03/2014   BUN 11 01/03/2014   CO2 25 01/03/2014   TSH 1.168  03/09/2013   INR 1.19 01/03/2014   HGBA1C 5.4 03/09/2013    Assessment / Plan: 1.  PAF - remains in sinus. Continue metoprolol and Eliquis.   2. History of diverticulitis/microperforation  3. Chronic anticoagulation  4. HTN - BP stable at home. I have left her on her current regimen. She will  Monitor and let us know if it remains high.   I will follow up in one year.

## 2014-11-03 ENCOUNTER — Other Ambulatory Visit: Payer: Self-pay | Admitting: Cardiology

## 2014-11-08 DIAGNOSIS — M5417 Radiculopathy, lumbosacral region: Secondary | ICD-10-CM | POA: Diagnosis not present

## 2014-11-08 DIAGNOSIS — M9903 Segmental and somatic dysfunction of lumbar region: Secondary | ICD-10-CM | POA: Diagnosis not present

## 2014-11-08 DIAGNOSIS — M9905 Segmental and somatic dysfunction of pelvic region: Secondary | ICD-10-CM | POA: Diagnosis not present

## 2014-11-08 DIAGNOSIS — M545 Low back pain: Secondary | ICD-10-CM | POA: Diagnosis not present

## 2014-11-13 DIAGNOSIS — H8110 Benign paroxysmal vertigo, unspecified ear: Secondary | ICD-10-CM | POA: Diagnosis not present

## 2014-12-11 ENCOUNTER — Other Ambulatory Visit: Payer: Self-pay

## 2014-12-11 ENCOUNTER — Other Ambulatory Visit: Payer: Self-pay | Admitting: Internal Medicine

## 2014-12-11 DIAGNOSIS — M5417 Radiculopathy, lumbosacral region: Secondary | ICD-10-CM | POA: Diagnosis not present

## 2014-12-11 DIAGNOSIS — M9901 Segmental and somatic dysfunction of cervical region: Secondary | ICD-10-CM | POA: Diagnosis not present

## 2014-12-11 DIAGNOSIS — M542 Cervicalgia: Secondary | ICD-10-CM | POA: Diagnosis not present

## 2014-12-11 DIAGNOSIS — M9905 Segmental and somatic dysfunction of pelvic region: Secondary | ICD-10-CM | POA: Diagnosis not present

## 2014-12-11 DIAGNOSIS — N644 Mastodynia: Secondary | ICD-10-CM

## 2014-12-12 ENCOUNTER — Other Ambulatory Visit: Payer: Self-pay | Admitting: Internal Medicine

## 2014-12-12 DIAGNOSIS — N644 Mastodynia: Secondary | ICD-10-CM

## 2014-12-15 ENCOUNTER — Other Ambulatory Visit: Payer: Medicare Other

## 2014-12-18 ENCOUNTER — Other Ambulatory Visit: Payer: Medicare Other

## 2014-12-25 ENCOUNTER — Ambulatory Visit
Admission: RE | Admit: 2014-12-25 | Discharge: 2014-12-25 | Disposition: A | Discharge status: Home / Self Care | Payer: Medicare Other | Source: Ambulatory Visit | Attending: Internal Medicine | Admitting: Internal Medicine

## 2014-12-25 ENCOUNTER — Other Ambulatory Visit: Payer: Self-pay | Admitting: Internal Medicine

## 2014-12-25 DIAGNOSIS — N644 Mastodynia: Secondary | ICD-10-CM

## 2015-01-10 DIAGNOSIS — M545 Low back pain: Secondary | ICD-10-CM | POA: Diagnosis not present

## 2015-01-10 DIAGNOSIS — M5417 Radiculopathy, lumbosacral region: Secondary | ICD-10-CM | POA: Diagnosis not present

## 2015-01-10 DIAGNOSIS — M9905 Segmental and somatic dysfunction of pelvic region: Secondary | ICD-10-CM | POA: Diagnosis not present

## 2015-01-10 DIAGNOSIS — M9903 Segmental and somatic dysfunction of lumbar region: Secondary | ICD-10-CM | POA: Diagnosis not present

## 2015-02-16 DIAGNOSIS — H40013 Open angle with borderline findings, low risk, bilateral: Secondary | ICD-10-CM | POA: Diagnosis not present

## 2015-03-07 DIAGNOSIS — M4302 Spondylolysis, cervical region: Secondary | ICD-10-CM | POA: Diagnosis not present

## 2015-03-07 DIAGNOSIS — M546 Pain in thoracic spine: Secondary | ICD-10-CM | POA: Diagnosis not present

## 2015-03-07 DIAGNOSIS — M9903 Segmental and somatic dysfunction of lumbar region: Secondary | ICD-10-CM | POA: Diagnosis not present

## 2015-03-07 DIAGNOSIS — M9901 Segmental and somatic dysfunction of cervical region: Secondary | ICD-10-CM | POA: Diagnosis not present

## 2015-03-07 DIAGNOSIS — M9902 Segmental and somatic dysfunction of thoracic region: Secondary | ICD-10-CM | POA: Diagnosis not present

## 2015-04-16 DIAGNOSIS — M9903 Segmental and somatic dysfunction of lumbar region: Secondary | ICD-10-CM | POA: Diagnosis not present

## 2015-04-16 DIAGNOSIS — M9902 Segmental and somatic dysfunction of thoracic region: Secondary | ICD-10-CM | POA: Diagnosis not present

## 2015-04-16 DIAGNOSIS — M4304 Spondylolysis, thoracic region: Secondary | ICD-10-CM | POA: Diagnosis not present

## 2015-04-16 DIAGNOSIS — M4306 Spondylolysis, lumbar region: Secondary | ICD-10-CM | POA: Diagnosis not present

## 2015-04-17 ENCOUNTER — Other Ambulatory Visit: Payer: Self-pay | Admitting: Cardiology

## 2015-05-17 DIAGNOSIS — M9905 Segmental and somatic dysfunction of pelvic region: Secondary | ICD-10-CM | POA: Diagnosis not present

## 2015-05-17 DIAGNOSIS — M9901 Segmental and somatic dysfunction of cervical region: Secondary | ICD-10-CM | POA: Diagnosis not present

## 2015-05-17 DIAGNOSIS — M25652 Stiffness of left hip, not elsewhere classified: Secondary | ICD-10-CM | POA: Diagnosis not present

## 2015-05-17 DIAGNOSIS — M546 Pain in thoracic spine: Secondary | ICD-10-CM | POA: Diagnosis not present

## 2015-05-17 DIAGNOSIS — M9902 Segmental and somatic dysfunction of thoracic region: Secondary | ICD-10-CM | POA: Diagnosis not present

## 2015-05-30 DIAGNOSIS — C44311 Basal cell carcinoma of skin of nose: Secondary | ICD-10-CM | POA: Diagnosis not present

## 2015-07-06 DIAGNOSIS — M9905 Segmental and somatic dysfunction of pelvic region: Secondary | ICD-10-CM | POA: Diagnosis not present

## 2015-07-06 DIAGNOSIS — M9901 Segmental and somatic dysfunction of cervical region: Secondary | ICD-10-CM | POA: Diagnosis not present

## 2015-07-06 DIAGNOSIS — M4307 Spondylolysis, lumbosacral region: Secondary | ICD-10-CM | POA: Diagnosis not present

## 2015-07-06 DIAGNOSIS — M4723 Other spondylosis with radiculopathy, cervicothoracic region: Secondary | ICD-10-CM | POA: Diagnosis not present

## 2015-07-06 DIAGNOSIS — M9902 Segmental and somatic dysfunction of thoracic region: Secondary | ICD-10-CM | POA: Diagnosis not present

## 2015-07-09 DIAGNOSIS — C44311 Basal cell carcinoma of skin of nose: Secondary | ICD-10-CM | POA: Diagnosis not present

## 2015-07-11 DIAGNOSIS — M4307 Spondylolysis, lumbosacral region: Secondary | ICD-10-CM | POA: Diagnosis not present

## 2015-07-11 DIAGNOSIS — M9905 Segmental and somatic dysfunction of pelvic region: Secondary | ICD-10-CM | POA: Diagnosis not present

## 2015-07-11 DIAGNOSIS — M9901 Segmental and somatic dysfunction of cervical region: Secondary | ICD-10-CM | POA: Diagnosis not present

## 2015-07-11 DIAGNOSIS — M4723 Other spondylosis with radiculopathy, cervicothoracic region: Secondary | ICD-10-CM | POA: Diagnosis not present

## 2015-07-11 DIAGNOSIS — M9902 Segmental and somatic dysfunction of thoracic region: Secondary | ICD-10-CM | POA: Diagnosis not present

## 2015-08-07 DIAGNOSIS — M4722 Other spondylosis with radiculopathy, cervical region: Secondary | ICD-10-CM | POA: Diagnosis not present

## 2015-08-07 DIAGNOSIS — M9902 Segmental and somatic dysfunction of thoracic region: Secondary | ICD-10-CM | POA: Diagnosis not present

## 2015-08-07 DIAGNOSIS — M9903 Segmental and somatic dysfunction of lumbar region: Secondary | ICD-10-CM | POA: Diagnosis not present

## 2015-08-07 DIAGNOSIS — M47894 Other spondylosis, thoracic region: Secondary | ICD-10-CM | POA: Diagnosis not present

## 2015-08-07 DIAGNOSIS — M9901 Segmental and somatic dysfunction of cervical region: Secondary | ICD-10-CM | POA: Diagnosis not present

## 2015-09-19 ENCOUNTER — Telehealth: Payer: Self-pay | Admitting: Cardiology

## 2015-09-20 NOTE — Telephone Encounter (Signed)
Close encounter 

## 2015-10-02 DIAGNOSIS — M9905 Segmental and somatic dysfunction of pelvic region: Secondary | ICD-10-CM | POA: Diagnosis not present

## 2015-10-02 DIAGNOSIS — M4723 Other spondylosis with radiculopathy, cervicothoracic region: Secondary | ICD-10-CM | POA: Diagnosis not present

## 2015-10-02 DIAGNOSIS — M47817 Spondylosis without myelopathy or radiculopathy, lumbosacral region: Secondary | ICD-10-CM | POA: Diagnosis not present

## 2015-10-02 DIAGNOSIS — M9901 Segmental and somatic dysfunction of cervical region: Secondary | ICD-10-CM | POA: Diagnosis not present

## 2015-10-18 ENCOUNTER — Other Ambulatory Visit: Payer: Self-pay | Admitting: Cardiology

## 2015-10-18 NOTE — Telephone Encounter (Signed)
Rx request sent to pharmacy.  

## 2015-10-23 DIAGNOSIS — M9905 Segmental and somatic dysfunction of pelvic region: Secondary | ICD-10-CM | POA: Diagnosis not present

## 2015-10-23 DIAGNOSIS — M47817 Spondylosis without myelopathy or radiculopathy, lumbosacral region: Secondary | ICD-10-CM | POA: Diagnosis not present

## 2015-10-23 DIAGNOSIS — M4723 Other spondylosis with radiculopathy, cervicothoracic region: Secondary | ICD-10-CM | POA: Diagnosis not present

## 2015-10-23 DIAGNOSIS — M9901 Segmental and somatic dysfunction of cervical region: Secondary | ICD-10-CM | POA: Diagnosis not present

## 2015-11-16 ENCOUNTER — Ambulatory Visit: Payer: Medicare Other | Admitting: Cardiology

## 2015-11-22 ENCOUNTER — Other Ambulatory Visit: Payer: Self-pay | Admitting: Cardiology

## 2015-11-22 NOTE — Telephone Encounter (Signed)
Rx(s) sent to pharmacy electronically.  

## 2015-11-23 DIAGNOSIS — Z13220 Encounter for screening for lipoid disorders: Secondary | ICD-10-CM | POA: Diagnosis not present

## 2015-11-23 DIAGNOSIS — E7800 Pure hypercholesterolemia, unspecified: Secondary | ICD-10-CM | POA: Diagnosis not present

## 2015-11-23 DIAGNOSIS — Z79899 Other long term (current) drug therapy: Secondary | ICD-10-CM | POA: Diagnosis not present

## 2015-11-23 DIAGNOSIS — Z0000 Encounter for general adult medical examination without abnormal findings: Secondary | ICD-10-CM | POA: Diagnosis not present

## 2015-11-23 DIAGNOSIS — Z85828 Personal history of other malignant neoplasm of skin: Secondary | ICD-10-CM | POA: Diagnosis not present

## 2015-11-23 DIAGNOSIS — Z0001 Encounter for general adult medical examination with abnormal findings: Secondary | ICD-10-CM | POA: Diagnosis not present

## 2015-11-23 LAB — HEPATIC FUNCTION PANEL
ALK PHOS: 68 (ref 25–125)
ALT: 23 (ref 7–35)
AST: 24 (ref 13–35)
Bilirubin, Total: 0.4

## 2015-11-23 LAB — LIPID PANEL
CHOLESTEROL: 145 (ref 0–200)
HDL: 48 (ref 35–70)
LDL CALC: 81
TRIGLYCERIDES: 79 (ref 40–160)

## 2015-11-23 LAB — BASIC METABOLIC PANEL
BUN: 14 (ref 4–21)
Creatinine: 0.9 (ref 0.5–1.1)
Glucose: 88
Potassium: 4.4 (ref 3.4–5.3)
SODIUM: 138 (ref 137–147)

## 2015-11-23 LAB — CBC AND DIFFERENTIAL
HEMATOCRIT: 39 (ref 36–46)
HEMOGLOBIN: 12.5 (ref 12.0–16.0)
PLATELETS: 235 (ref 150–399)
WBC: 9

## 2015-11-30 DIAGNOSIS — Z1212 Encounter for screening for malignant neoplasm of rectum: Secondary | ICD-10-CM | POA: Diagnosis not present

## 2015-11-30 DIAGNOSIS — M2550 Pain in unspecified joint: Secondary | ICD-10-CM | POA: Diagnosis not present

## 2015-11-30 DIAGNOSIS — Z0000 Encounter for general adult medical examination without abnormal findings: Secondary | ICD-10-CM | POA: Diagnosis not present

## 2015-12-04 DIAGNOSIS — M62838 Other muscle spasm: Secondary | ICD-10-CM | POA: Diagnosis not present

## 2015-12-04 DIAGNOSIS — M4302 Spondylolysis, cervical region: Secondary | ICD-10-CM | POA: Diagnosis not present

## 2015-12-04 DIAGNOSIS — M546 Pain in thoracic spine: Secondary | ICD-10-CM | POA: Diagnosis not present

## 2015-12-04 DIAGNOSIS — M4306 Spondylolysis, lumbar region: Secondary | ICD-10-CM | POA: Diagnosis not present

## 2016-01-15 DIAGNOSIS — M4302 Spondylolysis, cervical region: Secondary | ICD-10-CM | POA: Diagnosis not present

## 2016-01-15 DIAGNOSIS — M9901 Segmental and somatic dysfunction of cervical region: Secondary | ICD-10-CM | POA: Diagnosis not present

## 2016-01-15 DIAGNOSIS — M9903 Segmental and somatic dysfunction of lumbar region: Secondary | ICD-10-CM | POA: Diagnosis not present

## 2016-01-15 DIAGNOSIS — M9902 Segmental and somatic dysfunction of thoracic region: Secondary | ICD-10-CM | POA: Diagnosis not present

## 2016-01-15 DIAGNOSIS — M4046 Postural lordosis, lumbar region: Secondary | ICD-10-CM | POA: Diagnosis not present

## 2016-01-18 ENCOUNTER — Encounter: Payer: Self-pay | Admitting: Cardiology

## 2016-01-18 ENCOUNTER — Ambulatory Visit (INDEPENDENT_AMBULATORY_CARE_PROVIDER_SITE_OTHER): Payer: Medicare Other | Admitting: Cardiology

## 2016-01-18 VITALS — BP 120/82 | HR 62 | Ht 63.0 in | Wt 165.8 lb

## 2016-01-18 DIAGNOSIS — I480 Paroxysmal atrial fibrillation: Secondary | ICD-10-CM

## 2016-01-18 DIAGNOSIS — I10 Essential (primary) hypertension: Secondary | ICD-10-CM | POA: Diagnosis not present

## 2016-01-18 NOTE — Progress Notes (Signed)
Kelli [name] Date of Birth: 1941/02/29 Medical Record [id number]  History of Present Illness: Ms. Waits is seen for follow up of atrial fibrillation.  She has a history of HTN, paroxysmal Afib with RVR. Admitted in 2014. Had demand ischemia. Myoview study was normal.  Echo from [place] of 2014 with EF of 55 to 60% and grade 1 diastolic dysfunction. She has been on chronic therapy with metoprolol and Eliquis.   On follow up today she is doing well. No chest pain or SOB. No dyspnea. No palpitations. BP at home has been normal. She admits she was less active this winter and gained weight. He is increasing her activity now. 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  Current Outpatient Prescriptions  Medication Sig Dispense Refill  . acetaminophen (TYLENOL) 500 MG tablet Take 500 mg by mouth daily as needed for pain.    [name] atorvastatin (LIPITOR) 10 MG tablet Take 5 mg by mouth daily.     . Calcium Carb-Cholecalciferol (CALCIUM + D3) 600-200 MG-UNIT TABS Take 1 tablet by mouth daily.    . Cholecalciferol (VITAMIN D) 400 UNITS capsule Take 400 Units by mouth daily.    [name] Calcium (STOOL SOFTENER PO) Take by mouth at bedtime.    [name] ELIQUIS 5 MG TABS tablet TAKE 1 TABLET BY MOUTH 2 TIMES DAILY. 180 tablet 0  . fish oil-omega-3 fatty acids 1000 MG capsule Take 1 g by mouth daily.    . metoprolol (LOPRESSOR) 50 MG tablet TAKE 1 TABLET BY MOUTH 2 TIMES DAILY. 60 tablet 2  . mometasone (NASONEX) 50 MCG/ACT nasal spray Place 2 sprays into the nose daily as needed (for allergies).    . Multiple Vitamin (MULTIVITAMIN) capsule Take 1 capsule by mouth daily.    . Pantoprazole Sodium (PROTONIX PO) Take by mouth as needed.    . psyllium (HYDROCIL/METAMUCIL) 95 % PACK Take 1 packet by mouth at bedtime.    . vitamin C (ASCORBIC ACID) 500 MG tablet Take 500 mg by mouth daily.     No current facility-administered medications for this visit.    Allergies  Allergen Reactions  . Augmentin [Amoxicillin-Pot  Clavulanate] Itching and Rash    Past Medical History  Diagnosis Date  . Hypertension   . NSTEMI (non-ST elevated myocardial infarction) ([place]) 02/2013    demand ischemia in setting of AFib with RVR; ETT-Myoview 03/11/13: No ischemia or scar, EF 70%.  . Atrial fibrillation ([place]) 02/2013    paroxsymal; c/b Type 2 NSTEMI; CHADS2-VASc=2 (age, sex - patient had come off anti-HTN meds in past); Eliquis started;  Echocardiogram 03/10/13: EF 0000000, grade 1 diastolic dysfunction, mild MR, mild LAE    Past Surgical History  Procedure Laterality Date  . Eye surgery    . Cataract extraction, bilateral    . Appendectomy    . Tonsillectomy      History  Smoking status  . Never Smoker   Smokeless tobacco  . Never Used    History  Alcohol Use No    History reviewed. No pertinent family history.  Review of Systems: The review of systems is per the HPI.  All other systems were reviewed and are negative.  Physical Exam: BP 120/82 mmHg  Pulse 62  Ht 5\' 3"  (1.6 m)  Wt 75.206 kg (165 lb 12.8 oz)  BMI 29.38 kg/m2 Patient is very pleasant and in no acute distress.  Skin is warm and dry. Color is normal.  HEENT is unremarkable. Normocephalic/atraumatic. PERRL. Sclera are nonicteric.  Neck is supple. No masses. No JVD. Lungs are clear. Cardiac exam shows a regular rate and rhythm. Normal S1-2. No gallop or murmur. Abdomen is soft. Extremities are without edema. Gait and ROM are intact. No gross neurologic deficits noted.  LABORATORY DATA:  Lab Results  Component Value Date   WBC 10.1 01/03/2014   HGB 13.3 01/03/2014   HCT 39.7 01/03/2014   PLT 381 01/03/2014   GLUCOSE 97 01/03/2014   CHOL 201* 03/10/2013   TRIG 70 03/10/2013   HDL 57 03/10/2013   LDLCALC 130* 03/10/2013   ALT 15 12/24/2013   AST 20 12/24/2013   NA 136* 01/03/2014   K 4.2 01/03/2014   CL 98 01/03/2014   CREATININE 0.95 01/03/2014   BUN 11 01/03/2014   CO2 25 01/03/2014   TSH 1.168 03/09/2013   INR 1.19 01/03/2014     HGBA1C 5.4 03/09/2013   Ecg today shows NSR rate 62. Normal. I have personally reviewed and interpreted this study.  Assessment / Plan: 1.  PAF - remains in sinus. Continue metoprolol and Eliquis.   2.  Chronic anticoagulation- no bleeding  4. HTN - BP controlled  Encouraged increased aerobic activity and weight loss  I will follow up in one year.

## 2016-02-08 ENCOUNTER — Other Ambulatory Visit: Payer: Self-pay

## 2016-02-08 DIAGNOSIS — Z1231 Encounter for screening mammogram for malignant neoplasm of breast: Secondary | ICD-10-CM

## 2016-02-14 DIAGNOSIS — H53001 Unspecified amblyopia, right eye: Secondary | ICD-10-CM | POA: Diagnosis not present

## 2016-02-14 DIAGNOSIS — M5431 Sciatica, right side: Secondary | ICD-10-CM | POA: Diagnosis not present

## 2016-02-14 DIAGNOSIS — M4308 Spondylolysis, sacral and sacrococcygeal region: Secondary | ICD-10-CM | POA: Diagnosis not present

## 2016-02-14 DIAGNOSIS — M9903 Segmental and somatic dysfunction of lumbar region: Secondary | ICD-10-CM | POA: Diagnosis not present

## 2016-02-14 DIAGNOSIS — M9901 Segmental and somatic dysfunction of cervical region: Secondary | ICD-10-CM | POA: Diagnosis not present

## 2016-02-14 DIAGNOSIS — M9904 Segmental and somatic dysfunction of sacral region: Secondary | ICD-10-CM | POA: Diagnosis not present

## 2016-02-26 ENCOUNTER — Ambulatory Visit
Admission: RE | Admit: 2016-02-26 | Discharge: 2016-02-26 | Disposition: A | Discharge status: Home / Self Care | Payer: Medicare Other | Source: Ambulatory Visit

## 2016-02-26 DIAGNOSIS — Z1231 Encounter for screening mammogram for malignant neoplasm of breast: Secondary | ICD-10-CM | POA: Diagnosis not present

## 2016-03-19 DIAGNOSIS — M5431 Sciatica, right side: Secondary | ICD-10-CM | POA: Diagnosis not present

## 2016-03-19 DIAGNOSIS — M9904 Segmental and somatic dysfunction of sacral region: Secondary | ICD-10-CM | POA: Diagnosis not present

## 2016-03-19 DIAGNOSIS — M5388 Other specified dorsopathies, sacral and sacrococcygeal region: Secondary | ICD-10-CM | POA: Diagnosis not present

## 2016-03-19 DIAGNOSIS — M9903 Segmental and somatic dysfunction of lumbar region: Secondary | ICD-10-CM | POA: Diagnosis not present

## 2016-03-19 DIAGNOSIS — M9901 Segmental and somatic dysfunction of cervical region: Secondary | ICD-10-CM | POA: Diagnosis not present

## 2016-04-15 DIAGNOSIS — M5431 Sciatica, right side: Secondary | ICD-10-CM | POA: Diagnosis not present

## 2016-04-15 DIAGNOSIS — M5388 Other specified dorsopathies, sacral and sacrococcygeal region: Secondary | ICD-10-CM | POA: Diagnosis not present

## 2016-04-15 DIAGNOSIS — M9901 Segmental and somatic dysfunction of cervical region: Secondary | ICD-10-CM | POA: Diagnosis not present

## 2016-04-15 DIAGNOSIS — M9903 Segmental and somatic dysfunction of lumbar region: Secondary | ICD-10-CM | POA: Diagnosis not present

## 2016-04-15 DIAGNOSIS — M9904 Segmental and somatic dysfunction of sacral region: Secondary | ICD-10-CM | POA: Diagnosis not present

## 2016-04-28 ENCOUNTER — Other Ambulatory Visit: Payer: Self-pay | Admitting: Cardiology

## 2016-04-30 DIAGNOSIS — M9904 Segmental and somatic dysfunction of sacral region: Secondary | ICD-10-CM | POA: Diagnosis not present

## 2016-04-30 DIAGNOSIS — M5431 Sciatica, right side: Secondary | ICD-10-CM | POA: Diagnosis not present

## 2016-04-30 DIAGNOSIS — M9901 Segmental and somatic dysfunction of cervical region: Secondary | ICD-10-CM | POA: Diagnosis not present

## 2016-04-30 DIAGNOSIS — M9903 Segmental and somatic dysfunction of lumbar region: Secondary | ICD-10-CM | POA: Diagnosis not present

## 2016-04-30 DIAGNOSIS — M5388 Other specified dorsopathies, sacral and sacrococcygeal region: Secondary | ICD-10-CM | POA: Diagnosis not present

## 2016-05-21 DIAGNOSIS — M9903 Segmental and somatic dysfunction of lumbar region: Secondary | ICD-10-CM | POA: Diagnosis not present

## 2016-05-21 DIAGNOSIS — M5431 Sciatica, right side: Secondary | ICD-10-CM | POA: Diagnosis not present

## 2016-05-21 DIAGNOSIS — M9904 Segmental and somatic dysfunction of sacral region: Secondary | ICD-10-CM | POA: Diagnosis not present

## 2016-05-21 DIAGNOSIS — M5388 Other specified dorsopathies, sacral and sacrococcygeal region: Secondary | ICD-10-CM | POA: Diagnosis not present

## 2016-05-21 DIAGNOSIS — M9901 Segmental and somatic dysfunction of cervical region: Secondary | ICD-10-CM | POA: Diagnosis not present

## 2016-06-25 DIAGNOSIS — M9903 Segmental and somatic dysfunction of lumbar region: Secondary | ICD-10-CM | POA: Diagnosis not present

## 2016-06-25 DIAGNOSIS — M9901 Segmental and somatic dysfunction of cervical region: Secondary | ICD-10-CM | POA: Diagnosis not present

## 2016-06-25 DIAGNOSIS — M4307 Spondylolysis, lumbosacral region: Secondary | ICD-10-CM | POA: Diagnosis not present

## 2016-06-25 DIAGNOSIS — M5388 Other specified dorsopathies, sacral and sacrococcygeal region: Secondary | ICD-10-CM | POA: Diagnosis not present

## 2016-06-25 DIAGNOSIS — M9904 Segmental and somatic dysfunction of sacral region: Secondary | ICD-10-CM | POA: Diagnosis not present

## 2016-07-22 DIAGNOSIS — M4307 Spondylolysis, lumbosacral region: Secondary | ICD-10-CM | POA: Diagnosis not present

## 2016-07-22 DIAGNOSIS — M9901 Segmental and somatic dysfunction of cervical region: Secondary | ICD-10-CM | POA: Diagnosis not present

## 2016-07-22 DIAGNOSIS — M5388 Other specified dorsopathies, sacral and sacrococcygeal region: Secondary | ICD-10-CM | POA: Diagnosis not present

## 2016-07-22 DIAGNOSIS — M9904 Segmental and somatic dysfunction of sacral region: Secondary | ICD-10-CM | POA: Diagnosis not present

## 2016-07-22 DIAGNOSIS — M9903 Segmental and somatic dysfunction of lumbar region: Secondary | ICD-10-CM | POA: Diagnosis not present

## 2016-08-19 DIAGNOSIS — M9903 Segmental and somatic dysfunction of lumbar region: Secondary | ICD-10-CM | POA: Diagnosis not present

## 2016-08-19 DIAGNOSIS — M9904 Segmental and somatic dysfunction of sacral region: Secondary | ICD-10-CM | POA: Diagnosis not present

## 2016-08-19 DIAGNOSIS — M9901 Segmental and somatic dysfunction of cervical region: Secondary | ICD-10-CM | POA: Diagnosis not present

## 2016-08-19 DIAGNOSIS — M5388 Other specified dorsopathies, sacral and sacrococcygeal region: Secondary | ICD-10-CM | POA: Diagnosis not present

## 2016-08-19 DIAGNOSIS — M4307 Spondylolysis, lumbosacral region: Secondary | ICD-10-CM | POA: Diagnosis not present

## 2016-09-23 DIAGNOSIS — M4302 Spondylolysis, cervical region: Secondary | ICD-10-CM | POA: Diagnosis not present

## 2016-09-23 DIAGNOSIS — M9902 Segmental and somatic dysfunction of thoracic region: Secondary | ICD-10-CM | POA: Diagnosis not present

## 2016-09-23 DIAGNOSIS — M9901 Segmental and somatic dysfunction of cervical region: Secondary | ICD-10-CM | POA: Diagnosis not present

## 2016-09-23 DIAGNOSIS — M9903 Segmental and somatic dysfunction of lumbar region: Secondary | ICD-10-CM | POA: Diagnosis not present

## 2016-09-23 DIAGNOSIS — M4303 Spondylolysis, cervicothoracic region: Secondary | ICD-10-CM | POA: Diagnosis not present

## 2016-11-10 DIAGNOSIS — M9902 Segmental and somatic dysfunction of thoracic region: Secondary | ICD-10-CM | POA: Diagnosis not present

## 2016-11-10 DIAGNOSIS — M9901 Segmental and somatic dysfunction of cervical region: Secondary | ICD-10-CM | POA: Diagnosis not present

## 2016-11-10 DIAGNOSIS — M4305 Spondylolysis, thoracolumbar region: Secondary | ICD-10-CM | POA: Diagnosis not present

## 2016-11-10 DIAGNOSIS — M4306 Spondylolysis, lumbar region: Secondary | ICD-10-CM | POA: Diagnosis not present

## 2016-11-10 DIAGNOSIS — M9904 Segmental and somatic dysfunction of sacral region: Secondary | ICD-10-CM | POA: Diagnosis not present

## 2016-12-01 DIAGNOSIS — Z1283 Encounter for screening for malignant neoplasm of skin: Secondary | ICD-10-CM | POA: Diagnosis not present

## 2016-12-01 DIAGNOSIS — L400 Psoriasis vulgaris: Secondary | ICD-10-CM | POA: Diagnosis not present

## 2016-12-01 DIAGNOSIS — L821 Other seborrheic keratosis: Secondary | ICD-10-CM | POA: Diagnosis not present

## 2016-12-09 DIAGNOSIS — M9901 Segmental and somatic dysfunction of cervical region: Secondary | ICD-10-CM | POA: Diagnosis not present

## 2016-12-09 DIAGNOSIS — Z85828 Personal history of other malignant neoplasm of skin: Secondary | ICD-10-CM | POA: Diagnosis not present

## 2016-12-09 DIAGNOSIS — E559 Vitamin D deficiency, unspecified: Secondary | ICD-10-CM | POA: Diagnosis not present

## 2016-12-09 DIAGNOSIS — Z0000 Encounter for general adult medical examination without abnormal findings: Secondary | ICD-10-CM | POA: Diagnosis not present

## 2016-12-09 DIAGNOSIS — M9904 Segmental and somatic dysfunction of sacral region: Secondary | ICD-10-CM | POA: Diagnosis not present

## 2016-12-09 DIAGNOSIS — M4305 Spondylolysis, thoracolumbar region: Secondary | ICD-10-CM | POA: Diagnosis not present

## 2016-12-09 DIAGNOSIS — M4306 Spondylolysis, lumbar region: Secondary | ICD-10-CM | POA: Diagnosis not present

## 2016-12-09 DIAGNOSIS — M9902 Segmental and somatic dysfunction of thoracic region: Secondary | ICD-10-CM | POA: Diagnosis not present

## 2016-12-09 DIAGNOSIS — Z79899 Other long term (current) drug therapy: Secondary | ICD-10-CM | POA: Diagnosis not present

## 2016-12-09 LAB — BASIC METABOLIC PANEL
BUN: 26 — AB (ref 4–21)
Creatinine: 1 (ref 0.5–1.1)
Glucose: 80
POTASSIUM: 4.6 (ref 3.4–5.3)
SODIUM: 140 (ref 137–147)

## 2016-12-09 LAB — LIPID PANEL
CHOLESTEROL: 180 (ref 0–200)
HDL: 48 (ref 35–70)
LDL Cholesterol: 113
TRIGLYCERIDES: 93 (ref 40–160)

## 2016-12-09 LAB — VITAMIN D 25 HYDROXY (VIT D DEFICIENCY, FRACTURES): Vit D, 25-Hydroxy: 42.8

## 2016-12-09 LAB — CBC AND DIFFERENTIAL
HEMATOCRIT: 41 (ref 36–46)
HEMOGLOBIN: 13.7 (ref 12.0–16.0)
Platelets: 223 (ref 150–399)
WBC: 7.2

## 2016-12-09 LAB — HEPATIC FUNCTION PANEL
ALT: 19 (ref 7–35)
AST: 25 (ref 13–35)
Alkaline Phosphatase: 63 (ref 25–125)
Bilirubin, Total: 0.5

## 2016-12-19 DIAGNOSIS — I10 Essential (primary) hypertension: Secondary | ICD-10-CM | POA: Diagnosis not present

## 2016-12-19 DIAGNOSIS — E7800 Pure hypercholesterolemia, unspecified: Secondary | ICD-10-CM | POA: Diagnosis not present

## 2016-12-19 DIAGNOSIS — Z0000 Encounter for general adult medical examination without abnormal findings: Secondary | ICD-10-CM | POA: Diagnosis not present

## 2016-12-19 DIAGNOSIS — M8589 Other specified disorders of bone density and structure, multiple sites: Secondary | ICD-10-CM | POA: Diagnosis not present

## 2016-12-19 DIAGNOSIS — M859 Disorder of bone density and structure, unspecified: Secondary | ICD-10-CM | POA: Diagnosis not present

## 2016-12-22 ENCOUNTER — Other Ambulatory Visit: Payer: Self-pay

## 2016-12-23 NOTE — Patient Outreach (Signed)
Mrs, Kelli  [name]  Have not answer our calls ,we have try getting in contact with her but there was no answer to our calls ,calling for  the [facility].

## 2017-01-05 DIAGNOSIS — M9904 Segmental and somatic dysfunction of sacral region: Secondary | ICD-10-CM | POA: Diagnosis not present

## 2017-01-05 DIAGNOSIS — M9901 Segmental and somatic dysfunction of cervical region: Secondary | ICD-10-CM | POA: Diagnosis not present

## 2017-01-05 DIAGNOSIS — M4306 Spondylolysis, lumbar region: Secondary | ICD-10-CM | POA: Diagnosis not present

## 2017-01-05 DIAGNOSIS — M542 Cervicalgia: Secondary | ICD-10-CM | POA: Diagnosis not present

## 2017-01-05 DIAGNOSIS — M9903 Segmental and somatic dysfunction of lumbar region: Secondary | ICD-10-CM | POA: Diagnosis not present

## 2017-01-07 DIAGNOSIS — M9901 Segmental and somatic dysfunction of cervical region: Secondary | ICD-10-CM | POA: Diagnosis not present

## 2017-01-07 DIAGNOSIS — M9904 Segmental and somatic dysfunction of sacral region: Secondary | ICD-10-CM | POA: Diagnosis not present

## 2017-01-07 DIAGNOSIS — M542 Cervicalgia: Secondary | ICD-10-CM | POA: Diagnosis not present

## 2017-01-07 DIAGNOSIS — M9903 Segmental and somatic dysfunction of lumbar region: Secondary | ICD-10-CM | POA: Diagnosis not present

## 2017-01-07 DIAGNOSIS — M4306 Spondylolysis, lumbar region: Secondary | ICD-10-CM | POA: Diagnosis not present

## 2017-01-13 DIAGNOSIS — M8580 Other specified disorders of bone density and structure, unspecified site: Secondary | ICD-10-CM | POA: Diagnosis not present

## 2017-02-02 ENCOUNTER — Other Ambulatory Visit: Payer: Self-pay | Admitting: Cardiology

## 2017-02-02 NOTE — Telephone Encounter (Signed)
Rx request sent to pharmacy.  

## 2017-02-12 NOTE — Progress Notes (Signed)
Kelli [name] Date of Birth: 07-17-1941 Medical Record #[id number]  History of Present Illness: Kelli [name] is seen for follow up of atrial fibrillation.  She has a history of HTN, paroxysmal Afib with RVR. Admitted in 2014. Had demand ischemia. Myoview study was normal.  Echo from [place] of 2014 with EF of 55 to 60% and grade 1 diastolic dysfunction. She has been on chronic therapy with metoprolol and Eliquis.   On follow up today she is doing well. Denies chest pain. Only has dyspnea when walking more than usual or in hot weather. No palpitations. BP at home has been in the 416S systolic but she doesn't check often. 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 Planning to exercise more and lose weight. She has rare "quick" palpitations.   Current Outpatient Prescriptions  Medication Sig Dispense Refill  . acetaminophen (TYLENOL) 500 MG tablet Take 500 mg by mouth daily as needed for pain.    [name] atorvastatin (LIPITOR) 10 MG tablet Take 5 mg by mouth daily.     . Calcium Carb-Cholecalciferol (CALCIUM + D3) 600-200 MG-UNIT TABS Take 1 tablet by mouth daily.    . Cholecalciferol (VITAMIN D) 400 UNITS capsule Take 400 Units by mouth daily.    [name] Calcium (STOOL SOFTENER PO) Take by mouth at bedtime.    [name] ELIQUIS 5 MG TABS tablet TAKE 1 TABLET BY MOUTH 2 TIMES DAILY. 180 tablet 2  . fish oil-omega-3 fatty acids 1000 MG capsule Take 1 g by mouth daily.    . metoprolol tartrate (LOPRESSOR) 50 MG tablet Take 75 mg by mouth daily.    . mometasone (NASONEX) 50 MCG/ACT nasal spray Place 2 sprays into the nose daily as needed (for allergies).    . Multiple Vitamin (MULTIVITAMIN) capsule Take 1 capsule by mouth daily.    . Pantoprazole Sodium (PROTONIX PO) Take by mouth as needed.    . psyllium (HYDROCIL/METAMUCIL) 95 % PACK Take 1 packet by mouth at bedtime.    . vitamin C (ASCORBIC ACID) 500 MG tablet Take 500 mg by mouth daily.    [name] losartan (COZAAR) 25 MG tablet Take 1 tablet (25 mg  total) by mouth daily. 90 tablet 3   No current facility-administered medications for this visit.     Allergies  Allergen Reactions  . Augmentin [Amoxicillin-Pot Clavulanate] Itching and Rash    Past Medical History:  Diagnosis Date  . Atrial fibrillation ([place]) 02/2013   paroxsymal; c/b Type 2 NSTEMI; CHADS2-VASc=2 (age, sex - patient had come off anti-HTN meds in past); Eliquis started;  Echocardiogram 03/10/13: EF 06-30%, grade 1 diastolic dysfunction, mild MR, mild LAE  . Hypertension   . NSTEMI (non-ST elevated myocardial infarction) ([place]) 02/2013   demand ischemia in setting of AFib with RVR; ETT-Myoview 03/11/13: No ischemia or scar, EF 70%.    Past Surgical History:  Procedure Laterality Date  . APPENDECTOMY    . CATARACT EXTRACTION, BILATERAL    . EYE SURGERY    . TONSILLECTOMY      History  Smoking Status  . Never Smoker  Smokeless Tobacco  . Never Used    History  Alcohol Use No    History reviewed. No pertinent family history.  Review of Systems: The review of systems is per the HPI.  All other systems were reviewed and are negative.  Physical Exam: BP (!) 152/68   Pulse (!) 56   Ht 5\' 3"  (1.6 m)   Wt 167  lb (75.8 kg)   BMI 29.58 kg/m  Patient is very pleasant and in no acute distress.  Skin is warm and dry. Color is normal.  HEENT is unremarkable. Normocephalic/atraumatic. PERRL. Sclera are nonicteric. Neck is supple. No masses. No JVD. Lungs are clear. Cardiac exam shows a regular rate and rhythm. Normal S1-2. No gallop or murmur. Abdomen is soft. Extremities are without edema. Gait and ROM are intact. No gross neurologic deficits noted.  LABORATORY DATA:  Lab Results  Component Value Date   WBC 10.1 01/03/2014   HGB 13.3 01/03/2014   HCT 39.7 01/03/2014   PLT 381 01/03/2014   GLUCOSE 97 01/03/2014   CHOL 201 (H) 03/10/2013   TRIG 70 03/10/2013   HDL 57 03/10/2013   LDLCALC 130 (H) 03/10/2013   ALT 15 12/24/2013   AST 20 12/24/2013   NA 136  (L) 01/03/2014   K 4.2 01/03/2014   CL 98 01/03/2014   CREATININE 0.95 01/03/2014   BUN 11 01/03/2014   CO2 25 01/03/2014   TSH 1.168 03/09/2013   INR 1.19 01/03/2014   HGBA1C 5.4 03/09/2013   Labs dated 12/09/16: cholesterol 180, triglycerides 93, HDL 48, LDL 113. CMET normal.   Ecg today shows NSR rate 56. Normal. I have personally reviewed and interpreted this study.  Assessment / Plan: 1.  PAF - remains in sinus. Continue metoprolol and Eliquis.   2.  Chronic anticoagulation- no bleeding  4. HTN - BP is poorly controlled. I get systolic of 827. Recommend lifestyle modification. Sodium restriction. Increased aerobic activity and weight loss. Unable to increase metoprolol due to bradycardia. Will add losartan 25 mg daily. Check BMET in 2 weeks.    I will follow up in 6 months.

## 2017-02-13 ENCOUNTER — Encounter: Payer: Self-pay | Admitting: Cardiology

## 2017-02-13 ENCOUNTER — Ambulatory Visit (INDEPENDENT_AMBULATORY_CARE_PROVIDER_SITE_OTHER): Payer: Medicare Other | Admitting: Cardiology

## 2017-02-13 VITALS — BP 152/68 | HR 56 | Ht 63.0 in | Wt 167.0 lb

## 2017-02-13 DIAGNOSIS — I10 Essential (primary) hypertension: Secondary | ICD-10-CM | POA: Diagnosis not present

## 2017-02-13 DIAGNOSIS — I480 Paroxysmal atrial fibrillation: Secondary | ICD-10-CM | POA: Diagnosis not present

## 2017-02-13 MED ORDER — LOSARTAN POTASSIUM 25 MG PO TABS: 25.0000 mg | ORAL_TABLET | Freq: Every day | ORAL | 3 refills | Status: DC

## 2017-02-13 NOTE — Addendum Note (Signed)
Addended by: Kathyrn Lass on: 02/13/2017 08:54 AM   Modules accepted: Orders

## 2017-02-13 NOTE — Patient Instructions (Addendum)
We will add losartan 25 mg daily for BP control  Continue your other therapy  Work on your exercise and weight loss. Sodium restriction  We will check a blood test in 2 weeks  Follow up in 6 months

## 2017-03-03 DIAGNOSIS — I10 Essential (primary) hypertension: Secondary | ICD-10-CM | POA: Diagnosis not present

## 2017-03-03 DIAGNOSIS — I480 Paroxysmal atrial fibrillation: Secondary | ICD-10-CM | POA: Diagnosis not present

## 2017-03-03 LAB — BASIC METABOLIC PANEL
BUN/Creatinine Ratio: 19 (ref 12–28)
BUN: 19 mg/dL (ref 8–27)
CO2: 23 mmol/L (ref 20–29)
Calcium: 9 mg/dL (ref 8.7–10.3)
Chloride: 101 mmol/L (ref 96–106)
Creatinine, Ser: 0.99 mg/dL (ref 0.57–1.00)
GFR calc Af Amer: 64 mL/min/{1.73_m2} (ref >59)
GFR calc non Af Amer: 56 mL/min/{1.73_m2} — ABNORMAL LOW (ref >59)
Glucose: 75 mg/dL (ref 65–99)
Potassium: 5.1 mmol/L (ref 3.5–5.2)
Sodium: 139 mmol/L (ref 134–144)

## 2017-03-09 DIAGNOSIS — M9903 Segmental and somatic dysfunction of lumbar region: Secondary | ICD-10-CM | POA: Diagnosis not present

## 2017-03-09 DIAGNOSIS — M9904 Segmental and somatic dysfunction of sacral region: Secondary | ICD-10-CM | POA: Diagnosis not present

## 2017-03-09 DIAGNOSIS — M5388 Other specified dorsopathies, sacral and sacrococcygeal region: Secondary | ICD-10-CM | POA: Diagnosis not present

## 2017-03-09 DIAGNOSIS — M4306 Spondylolysis, lumbar region: Secondary | ICD-10-CM | POA: Diagnosis not present

## 2017-03-09 DIAGNOSIS — M9901 Segmental and somatic dysfunction of cervical region: Secondary | ICD-10-CM | POA: Diagnosis not present

## 2017-03-13 ENCOUNTER — Other Ambulatory Visit: Payer: Self-pay | Admitting: Cardiology

## 2017-04-06 DIAGNOSIS — M5388 Other specified dorsopathies, sacral and sacrococcygeal region: Secondary | ICD-10-CM | POA: Diagnosis not present

## 2017-04-06 DIAGNOSIS — M9901 Segmental and somatic dysfunction of cervical region: Secondary | ICD-10-CM | POA: Diagnosis not present

## 2017-04-06 DIAGNOSIS — M4306 Spondylolysis, lumbar region: Secondary | ICD-10-CM | POA: Diagnosis not present

## 2017-04-06 DIAGNOSIS — M9904 Segmental and somatic dysfunction of sacral region: Secondary | ICD-10-CM | POA: Diagnosis not present

## 2017-04-06 DIAGNOSIS — M9903 Segmental and somatic dysfunction of lumbar region: Secondary | ICD-10-CM | POA: Diagnosis not present

## 2017-05-11 DIAGNOSIS — M9903 Segmental and somatic dysfunction of lumbar region: Secondary | ICD-10-CM | POA: Diagnosis not present

## 2017-05-11 DIAGNOSIS — M4306 Spondylolysis, lumbar region: Secondary | ICD-10-CM | POA: Diagnosis not present

## 2017-05-11 DIAGNOSIS — M9902 Segmental and somatic dysfunction of thoracic region: Secondary | ICD-10-CM | POA: Diagnosis not present

## 2017-05-11 DIAGNOSIS — M9904 Segmental and somatic dysfunction of sacral region: Secondary | ICD-10-CM | POA: Diagnosis not present

## 2017-05-11 DIAGNOSIS — M5388 Other specified dorsopathies, sacral and sacrococcygeal region: Secondary | ICD-10-CM | POA: Diagnosis not present

## 2017-06-09 DIAGNOSIS — M9901 Segmental and somatic dysfunction of cervical region: Secondary | ICD-10-CM | POA: Diagnosis not present

## 2017-06-09 DIAGNOSIS — M9904 Segmental and somatic dysfunction of sacral region: Secondary | ICD-10-CM | POA: Diagnosis not present

## 2017-06-09 DIAGNOSIS — M4722 Other spondylosis with radiculopathy, cervical region: Secondary | ICD-10-CM | POA: Diagnosis not present

## 2017-06-09 DIAGNOSIS — M47896 Other spondylosis, lumbar region: Secondary | ICD-10-CM | POA: Diagnosis not present

## 2017-06-09 DIAGNOSIS — M9903 Segmental and somatic dysfunction of lumbar region: Secondary | ICD-10-CM | POA: Diagnosis not present

## 2017-07-08 DIAGNOSIS — M9903 Segmental and somatic dysfunction of lumbar region: Secondary | ICD-10-CM | POA: Diagnosis not present

## 2017-07-08 DIAGNOSIS — M9901 Segmental and somatic dysfunction of cervical region: Secondary | ICD-10-CM | POA: Diagnosis not present

## 2017-07-08 DIAGNOSIS — M4722 Other spondylosis with radiculopathy, cervical region: Secondary | ICD-10-CM | POA: Diagnosis not present

## 2017-07-08 DIAGNOSIS — M47896 Other spondylosis, lumbar region: Secondary | ICD-10-CM | POA: Diagnosis not present

## 2017-07-08 DIAGNOSIS — M9904 Segmental and somatic dysfunction of sacral region: Secondary | ICD-10-CM | POA: Diagnosis not present

## 2017-08-03 DIAGNOSIS — M5388 Other specified dorsopathies, sacral and sacrococcygeal region: Secondary | ICD-10-CM | POA: Diagnosis not present

## 2017-08-03 DIAGNOSIS — M9901 Segmental and somatic dysfunction of cervical region: Secondary | ICD-10-CM | POA: Diagnosis not present

## 2017-08-03 DIAGNOSIS — M9904 Segmental and somatic dysfunction of sacral region: Secondary | ICD-10-CM | POA: Diagnosis not present

## 2017-08-03 DIAGNOSIS — M9903 Segmental and somatic dysfunction of lumbar region: Secondary | ICD-10-CM | POA: Diagnosis not present

## 2017-08-03 DIAGNOSIS — M4306 Spondylolysis, lumbar region: Secondary | ICD-10-CM | POA: Diagnosis not present

## 2017-08-18 ENCOUNTER — Other Ambulatory Visit: Payer: Self-pay | Admitting: Cardiology

## 2017-08-29 ENCOUNTER — Other Ambulatory Visit: Payer: Self-pay | Admitting: Cardiology

## 2017-09-01 DIAGNOSIS — M5388 Other specified dorsopathies, sacral and sacrococcygeal region: Secondary | ICD-10-CM | POA: Diagnosis not present

## 2017-09-01 DIAGNOSIS — M4306 Spondylolysis, lumbar region: Secondary | ICD-10-CM | POA: Diagnosis not present

## 2017-09-01 DIAGNOSIS — M9901 Segmental and somatic dysfunction of cervical region: Secondary | ICD-10-CM | POA: Diagnosis not present

## 2017-09-01 DIAGNOSIS — M9903 Segmental and somatic dysfunction of lumbar region: Secondary | ICD-10-CM | POA: Diagnosis not present

## 2017-09-01 DIAGNOSIS — M9904 Segmental and somatic dysfunction of sacral region: Secondary | ICD-10-CM | POA: Diagnosis not present

## 2017-09-22 ENCOUNTER — Telehealth: Payer: Self-pay | Admitting: Cardiology

## 2017-09-22 NOTE — Telephone Encounter (Signed)
Left detailed message to contact pharmacy where Rx refilled to see if lot number she currently has is recalled, if so pharmacy should replace

## 2017-09-22 NOTE — Telephone Encounter (Signed)
New Message  Pt c/o medication issue:  1. Name of Medication: Losartan  2. How are you currently taking this medication (dosage and times per day)? 25mg  once a day  3. Are you having a reaction (difficulty breathing--STAT)? no 4. What is your medication issue? Patient is concern about the recall of the Losartan. She wants to know if she should discontinue taking it or not. Please call. She states that a detailed voicemail can be left.

## 2017-09-29 DIAGNOSIS — M9902 Segmental and somatic dysfunction of thoracic region: Secondary | ICD-10-CM | POA: Diagnosis not present

## 2017-09-29 DIAGNOSIS — M9901 Segmental and somatic dysfunction of cervical region: Secondary | ICD-10-CM | POA: Diagnosis not present

## 2017-09-29 DIAGNOSIS — M4306 Spondylolysis, lumbar region: Secondary | ICD-10-CM | POA: Diagnosis not present

## 2017-09-29 DIAGNOSIS — M9903 Segmental and somatic dysfunction of lumbar region: Secondary | ICD-10-CM | POA: Diagnosis not present

## 2017-09-29 DIAGNOSIS — M4304 Spondylolysis, thoracic region: Secondary | ICD-10-CM | POA: Diagnosis not present

## 2017-10-07 NOTE — Progress Notes (Signed)
Kelli [name] Date of Birth: [birth year] Medical Record #[id number]  History of Present Illness: Kelli [name] is seen for follow up of atrial fibrillation.  She has a history of HTN, paroxysmal Afib with RVR. Admitted in 2014. Had demand ischemia. Myoview study was normal.  Echo from [place] of 2014 with EF of 55 to 60% and grade 1 diastolic dysfunction. She has been on chronic therapy with metoprolol and Eliquis. On her last visit losartan was added for BP control.   On follow up today she is doing well from a cardiac standpoint. Denies chest pain. May be a little more SOB with exertion and attributes this to her weight. No significant  palpitations. She was diagnosed with Strep throat last week and is on antibiotics. She notes BP better at home.  Current Outpatient Medications  Medication Sig Dispense Refill  . acetaminophen (TYLENOL) 500 MG tablet Take 500 mg by mouth daily as needed for pain.    [name] atorvastatin (LIPITOR) 10 MG tablet Take 5 mg by mouth daily.     . Calcium Carb-Cholecalciferol (CALCIUM + D3) 600-200 MG-UNIT TABS Take 1 tablet by mouth daily.    . Cholecalciferol (VITAMIN D) 400 UNITS capsule Take 400 Units by mouth daily.    Kelli [name] Calcium (STOOL SOFTENER PO) Take by mouth at bedtime.    [name] ELIQUIS 5 MG TABS tablet TAKE 1 TABLET BY MOUTH 2 TIMES DAILY. 180 tablet 1  . fish oil-omega-3 fatty acids 1000 MG capsule Take 1 g by mouth daily.    [name] losartan (COZAAR) 25 MG tablet Take 1 tablet (25 mg total) by mouth daily. 90 tablet 3  . metoprolol tartrate (LOPRESSOR) 50 MG tablet TAKE 1 TABLET BY MOUTH 2 TIMES DAILY. 60 tablet 7  . mometasone (NASONEX) 50 MCG/ACT nasal spray Place 2 sprays into the nose daily as needed (for allergies).    . Multiple Vitamin (MULTIVITAMIN) capsule Take 1 capsule by mouth daily.    . naproxen sodium (ALEVE) 220 MG tablet Take 220 mg by mouth daily as needed.    . Pantoprazole Sodium (PROTONIX PO) Take by mouth as needed.    . Probiotic Product  (PROBIOTIC DAILY PO) Take 1 tablet by mouth daily.    . psyllium (HYDROCIL/METAMUCIL) 95 % PACK Take 1 packet by mouth at bedtime.    . vitamin C (ASCORBIC ACID) 500 MG tablet Take 500 mg by mouth daily.     No current facility-administered medications for this visit.     Allergies  Allergen Reactions  . Augmentin [Amoxicillin-Pot Clavulanate] Itching and Rash    Past Medical History:  Diagnosis Date  . Atrial fibrillation ([place]) 02/2013   paroxsymal; c/b Type 2 NSTEMI; CHADS2-VASc=2 (age, sex - patient had come off anti-HTN meds in past); Eliquis started;  Echocardiogram 03/10/13: EF 63-87%, grade 1 diastolic dysfunction, mild MR, mild LAE  . Hypertension   . NSTEMI (non-ST elevated myocardial infarction) ([place]) 02/2013   demand ischemia in setting of AFib with RVR; ETT-Myoview 03/11/13: No ischemia or scar, EF 70%.    Past Surgical History:  Procedure Laterality Date  . APPENDECTOMY    . CATARACT EXTRACTION, BILATERAL    . EYE SURGERY    . TONSILLECTOMY      Social History   Tobacco Use  Smoking Status Never Smoker  Smokeless Tobacco Never Used    Social History   Substance and Sexual Activity  Alcohol Use No    History reviewed. No pertinent family history.  Review  of Systems: The review of systems is per the HPI.  All other systems were reviewed and are negative.  Physical Exam: BP (!) 160/72   Pulse 64   Ht 5\' 3"  (1.6 m)   Wt 169 lb (76.7 kg)   BMI 29.94 kg/m  GENERAL:  Well appearing WF in NAD HEENT:  PERRL, EOMI, sclera are clear. Oropharynx is clear. NECK:  No jugular venous distention, carotid upstroke brisk and symmetric, no bruits, no thyromegaly or adenopathy LUNGS:  Clear to auscultation bilaterally CHEST:  Unremarkable HEART:  RRR,  PMI not displaced or sustained,S1 and S2 within normal limits, no S3, no S4: no clicks, no rubs, no murmurs ABD:  Soft, nontender. BS +, no masses or bruits. No hepatomegaly, no splenomegaly EXT:  2 + pulses throughout,  no edema, no cyanosis no clubbing SKIN:  Warm and dry.  No rashes NEURO:  Alert and oriented x 3. Cranial nerves II through XII intact. PSYCH:  Cognitively intact    LABORATORY DATA:  Lab Results  Component Value Date   WBC 10.1 01/03/2014   HGB 13.3 01/03/2014   HCT 39.7 01/03/2014   PLT 381 01/03/2014   GLUCOSE 75 03/03/2017   CHOL 201 (H) 03/10/2013   TRIG 70 03/10/2013   HDL 57 03/10/2013   LDLCALC 130 (H) 03/10/2013   ALT 15 12/24/2013   AST 20 12/24/2013   NA 139 03/03/2017   K 5.1 03/03/2017   CL 101 03/03/2017   CREATININE 0.99 03/03/2017   BUN 19 03/03/2017   CO2 23 03/03/2017   TSH 1.168 03/09/2013   INR 1.19 01/03/2014   HGBA1C 5.4 03/09/2013   Labs dated 12/09/16: cholesterol 180, triglycerides 93, HDL 48, LDL 113. CMET normal.   Assessment / Plan: 1.  PAF - remains in sinus by exam. Continue metoprolol and Eliquis.   2.  Chronic anticoagulation- no bleeding  3. HTN - BP is elevated today. I have asked her to check her BP daily for the next 2 weeks. If it stays over 518 systolic I would recommend increasing her dose of losartan. Sodium restriction. Increased aerobic activity and weight loss.    I will follow up in 6 months.

## 2017-10-08 DIAGNOSIS — J029 Acute pharyngitis, unspecified: Secondary | ICD-10-CM | POA: Diagnosis not present

## 2017-10-08 DIAGNOSIS — R509 Fever, unspecified: Secondary | ICD-10-CM | POA: Diagnosis not present

## 2017-10-08 DIAGNOSIS — J020 Streptococcal pharyngitis: Secondary | ICD-10-CM | POA: Diagnosis not present

## 2017-10-08 DIAGNOSIS — D72829 Elevated white blood cell count, unspecified: Secondary | ICD-10-CM | POA: Diagnosis not present

## 2017-10-12 ENCOUNTER — Ambulatory Visit: Payer: Medicare Other | Admitting: Cardiology

## 2017-10-12 ENCOUNTER — Encounter: Payer: Self-pay | Admitting: Cardiology

## 2017-10-12 VITALS — BP 160/72 | HR 64 | Ht 63.0 in | Wt 169.0 lb

## 2017-10-12 DIAGNOSIS — I480 Paroxysmal atrial fibrillation: Secondary | ICD-10-CM

## 2017-10-12 DIAGNOSIS — I10 Essential (primary) hypertension: Secondary | ICD-10-CM

## 2017-10-12 NOTE — Patient Instructions (Addendum)
Keep an eye on your BP - would like to see systolic reading under 987- if it is staying high let me know and we will increase your medication  I will see you in 6 months.

## 2017-10-15 DIAGNOSIS — D72829 Elevated white blood cell count, unspecified: Secondary | ICD-10-CM | POA: Diagnosis not present

## 2017-10-21 DIAGNOSIS — D72829 Elevated white blood cell count, unspecified: Secondary | ICD-10-CM | POA: Diagnosis not present

## 2017-10-27 DIAGNOSIS — J329 Chronic sinusitis, unspecified: Secondary | ICD-10-CM | POA: Diagnosis not present

## 2017-10-27 DIAGNOSIS — B370 Candidal stomatitis: Secondary | ICD-10-CM | POA: Diagnosis not present

## 2017-11-02 ENCOUNTER — Encounter: Payer: Self-pay | Admitting: Family Medicine

## 2017-11-02 ENCOUNTER — Ambulatory Visit: Payer: Medicare Other | Admitting: Family Medicine

## 2017-11-02 VITALS — BP 134/78 | HR 63 | Ht 63.0 in | Wt 169.0 lb

## 2017-11-02 DIAGNOSIS — I214 Non-ST elevation (NSTEMI) myocardial infarction: Secondary | ICD-10-CM

## 2017-11-02 DIAGNOSIS — J0100 Acute maxillary sinusitis, unspecified: Secondary | ICD-10-CM

## 2017-11-02 DIAGNOSIS — J301 Allergic rhinitis due to pollen: Secondary | ICD-10-CM | POA: Diagnosis not present

## 2017-11-02 DIAGNOSIS — J3089 Other allergic rhinitis: Secondary | ICD-10-CM | POA: Insufficient documentation

## 2017-11-02 DIAGNOSIS — E559 Vitamin D deficiency, unspecified: Secondary | ICD-10-CM

## 2017-11-02 DIAGNOSIS — R519 Headache, unspecified: Secondary | ICD-10-CM

## 2017-11-02 DIAGNOSIS — E785 Hyperlipidemia, unspecified: Secondary | ICD-10-CM | POA: Diagnosis not present

## 2017-11-02 DIAGNOSIS — K219 Gastro-esophageal reflux disease without esophagitis: Secondary | ICD-10-CM | POA: Diagnosis not present

## 2017-11-02 DIAGNOSIS — I4891 Unspecified atrial fibrillation: Secondary | ICD-10-CM

## 2017-11-02 DIAGNOSIS — R51 Headache: Secondary | ICD-10-CM | POA: Diagnosis not present

## 2017-11-02 DIAGNOSIS — E782 Mixed hyperlipidemia: Secondary | ICD-10-CM | POA: Insufficient documentation

## 2017-11-02 DIAGNOSIS — I10 Essential (primary) hypertension: Secondary | ICD-10-CM | POA: Diagnosis not present

## 2017-11-02 MED ORDER — PREDNISONE 10 MG PO TABS: ORAL_TABLET | ORAL | 0 refills | Status: DC

## 2017-11-02 NOTE — Progress Notes (Signed)
New patient office visit note:  Impression and Recommendations:    1. Essential hypertension   2. NSTEMI (non-ST elevated myocardial infarction) ([place])   3. Atrial fibrillation with RVR ([place])   4. Hyperlipidemia, unspecified hyperlipidemia type   5. Gastroesophageal reflux disease, esophagitis presence not specified   6. Allergic rhinitis due to pollen, unspecified seasonality   7. Environmental and seasonal allergies   8. Vitamin D deficiency   9. Sinus headache   10. Subacute maxillary sinusitis    1. Cardiology - Advised the patient to contact Cardiology with regards to obtaining another strep test. - Patient will continue to get rate control medicines and heart medicines from Dr. Martinique. - Advised the patient to continue following up with cardiology regularly.  2. Sinusitis/Sinus Headache - Sinus/jaw tenderness and pain is likely due to her recent sinusitis.  - Advised the patient to begin using AYR or Neilmed sinus rinses BID followed by flonase BID (one spray to each nostril).  Since the weather has been hot/cold/hot/cold lately, allergy season is beginning.  Advised that the patient may also incorporate allegra, zyrtec, or claritin PRN.   - Patient should finish all antibiotics and continue to drink fluids.  - Use over the counter decongestants and make sure to rinse twice a day.  - Since the patient is going to [place] next week, flying by plane, she is concerned about her sinuses getting worse.  We will give her a prednisone taper.  The patient has historically used prednisone in the past, and tolerated it well.  - Reviewed the prednisone taper in detail with the patient.  - If she gets SOB, chest pain, or jaw pain associated with exercise, she knows to come back in ASAP for further attention.  3. General Health Maintenance - Advised patient to continue working toward exercising to improve health.    - Patient will begin with 15 minutes of activity daily.   Recommended that the patient eventually strive for at least 150 minutes of cardio per week according to guidelines established by the [facility].   - Healthy dietary habits encouraged, including low-carb, and high amounts of lean protein in diet.   - Patient should also consume adequate amounts of water - half of body weight in oz of water per day  - Reviewed that at this time of year, conditions are especially dehydrating, so she should drink even more water than usual.  4. Follow-Up - Will obtain fasting blood work for next appointment. - CPE in 2 months.  Come fasting for blood work to be drawn at appointment. - Follow up OV 1-2 weeks later to review labs, per patient preference.    Education and routine counseling performed. Handouts provided.    Meds ordered this encounter  Medications  . predniSONE (DELTASONE) 10 MG tablet    Sig: 2 tabs BID for 3 d, then 1 tab BID for 3 d, then 1/2 tab for 4 d    Dispense:  30 tablet    Refill:  0    Gross side effects, risk and benefits, and alternatives of medications discussed with patient.  Patient is aware that all medications have potential side effects and we are unable to predict every side effect or drug-drug interaction that may occur.  Expresses verbal understanding and consents to current therapy plan and treatment regimen.  Return for CPE in 2 mo or so- come fasting for bldwrk; then f/up OV 1-2 wks later discuss bldwrk per pt prefer.  Please  see AVS handed out to patient at the end of our visit for further patient instructions/ counseling done pertaining to today's office visit.    Note: This document was prepared using Dragon voice recognition software and may include unintentional dictation errors.     This document serves as a record of services personally performed by Mellody Dance, DO. It was created on her behalf by [name], a trained medical scribe. The creation of this record is based on the scribe's personal  observations and the provider's statements to them.   I have reviewed the above medical documentation for accuracy and completeness and I concur.  Mellody Dance 11/02/17 12:57 PM    ----------------------------------------------------------------------------------------------------------------------    Subjective:    Chief complaint:   Chief Complaint  Patient presents with  . Establish Care    HPI: Kelli [name] is a pleasant 77 y.o. female who presents to [place] [facility] today to review their medical history with me and establish care.   I asked the patient to review their chronic problem list with me to ensure everything was updated and accurate.    All recent office visits with other providers, any medical records that patient brought in etc  - I reviewed today.     We asked pt to get [place] their medical records from [facility] providers/ specialists that they had seen within the past 3-5 years- if they are in private practice and/or do not work for [employer], [facility], [place], [place] or [employer] owned practice.  Told them to call their specialists to clarify this if they are not sure.   Was previously obtaining care through [facility], with Dr. Deland Pretty.  Patient did not bring a sheet with her list of specialists today.    Social History Tobacco Use Never smoker  Does walk and exercise as much as possible. Tries to walk her dog when the weather is pretty.  Past Medical History  - NSTEMI - Atrial Fibrillation with RVR Heart attack in 2014. Heart began racing - drove herself home. Noted she felt funny and went to the fire station. Was in uncontrollable afib at that time. Only had a fast beating heart at that time. No chest pain or other symptoms. That is when she saw Dr. Martinique, 02/2013. Admitted to the hospital - had a MI. Was not shocked back into rhythm. Was placed on medication and Eliquis. Went through stress test and  everything. Is on metoprolol, and so far it has not come back.   Last stress test was in 2014. Saw Dr. Martinique January of this year (2019). Sees Cardiology (Dr. Martinique) yearly.  - Recent Strep/Sinusitis Saw Dr. Martinique in January right after she had strep, and her BP was high. After that appointment, she woke up with swelling and soreness on R face. Finished her strep medicine - Z-pack provided through [place] office. Woke up Feb 4 with swelling and strange jaw feeling. Went back to Dr. Pennie Banter office on the 11th of February (7 days ago). Was told she had a sinus infection at that time. Started on a new antibiotic at that time - does not have the prescription with her today. Has had this R jaw pain on and off since then. But was diagnosed with strep throat & sinusitis.  Denis SOB, soreness or radiating pain, and denies exacerbation with exercise. Denies fever or chills.  - HTN Blood pressure normally runs in 130's140's/70's Continue to get rate control medicines and  heart medicines from Dr. Martinique.  Sees Dermatology (Dr. Theda Sers). Sees [employer]  GI doctor previously for diverticulitis; through [facility]. 2015 - Dr. Jessie Foot [facility].  Has not been to gynecology in years.  Obtained pap smears with Dr. Shelia Media.  Patient would like to eventually get off of some medicines. Feels that if she could lose some weight, it would help.   Wt Readings from Last 3 Encounters:  11/02/17 169 lb (76.7 kg)  10/12/17 169 lb (76.7 kg)  02/13/17 167 lb (75.8 kg)   BP Readings from Last 3 Encounters:  11/02/17 134/78  10/12/17 (!) 160/72  02/13/17 (!) 152/68   Pulse Readings from Last 3 Encounters:  11/02/17 63  10/12/17 64  02/13/17 (!) 56   BMI Readings from Last 3 Encounters:  11/02/17 29.94 kg/m  10/12/17 29.94 kg/m  02/13/17 29.58 kg/m    Patient Care Team    Relationship Specialty Notifications Start End  Mellody Dance, DO PCP -  General Family Medicine  11/02/17   Martinique, Peter M, MD Consulting Physician Cardiology  11/02/17   Allyn Kenner, MD Consulting Physician Dermatology  11/02/17    Comment: see's them yrly- Dr Theda Sers is who she actually sees  Wonda Horner, MD Consulting Physician Gastroenterology  11/02/17     Patient Active Problem List   Diagnosis Date Noted  . NSTEMI (non-ST elevated myocardial infarction) ([place]) 03/10/2013    Priority: High  . Hyperlipidemia 11/02/2017  . Gastroesophageal reflux disease 11/02/2017  . Allergic rhinitis due to pollen 11/02/2017  . Environmental and seasonal allergies 11/02/2017  . Vitamin D deficiency 11/02/2017  . HTN (hypertension) 03/10/2014  . Perforated diverticulitis 12/24/2013  . Atrial fibrillation ([place]) 12/24/2013  . Unspecified constipation 12/24/2013  . Fatigue 08/18/2013  . Atrial fibrillation with RVR ([place]) 03/09/2013  . Elevated troponin 03/09/2013     Past Medical History:  Diagnosis Date  . Atrial fibrillation ([place]) 02/2013   paroxsymal; c/b Type 2 NSTEMI; CHADS2-VASc=2 (age, sex - patient had come off anti-HTN meds in past); Eliquis started;  Echocardiogram 03/10/13: EF 44-01%, grade 1 diastolic dysfunction, mild MR, mild LAE  . Hyperlipidemia   . Hypertension   . NSTEMI (non-ST elevated myocardial infarction) ([place]) 02/2013   demand ischemia in setting of AFib with RVR; ETT-Myoview 03/11/13: No ischemia or scar, EF 70%.     Past Medical History:  Diagnosis Date  . Atrial fibrillation ([place]) 02/2013   paroxsymal; c/b Type 2 NSTEMI; CHADS2-VASc=2 (age, sex - patient had come off anti-HTN meds in past); Eliquis started;  Echocardiogram 03/10/13: EF 02-72%, grade 1 diastolic dysfunction, mild MR, mild LAE  . Hyperlipidemia   . Hypertension   . NSTEMI (non-ST elevated myocardial infarction) ([place]) 02/2013   demand ischemia in setting of AFib with RVR; ETT-Myoview 03/11/13: No ischemia or scar, EF 70%.     Past Surgical History:  Procedure Laterality  Date  . APPENDECTOMY    . CATARACT EXTRACTION, BILATERAL    . EYE SURGERY    . TONSILLECTOMY       Family History  Problem Relation Age of Onset  . Heart attack Mother 64  . Cancer Maternal Aunt        throat     Social History   Substance and Sexual Activity  Drug Use No     Social History   Substance and Sexual Activity  Alcohol Use Yes   Comment: 1-2 glasses of wine occ.     Social History  Tobacco Use  Smoking Status Never Smoker  Smokeless Tobacco Never Used     Current Meds  Medication Sig  . acetaminophen (TYLENOL) 500 MG tablet Take 500 mg by mouth daily as needed for pain.  [name] atorvastatin (LIPITOR) 10 MG tablet Take 5 mg by mouth daily.   . Calcium Carb-Cholecalciferol (CALCIUM + D3) 600-200 MG-UNIT TABS Take 1 tablet by mouth daily.  . cefUROXime (CEFTIN) 500 MG tablet Take 1 tablet by mouth 2 (two) times daily.  . Cholecalciferol (VITAMIN D) 400 UNITS capsule Take 400 Units by mouth daily.  [name] Calcium (STOOL SOFTENER PO) Take by mouth at bedtime.  [name] ELIQUIS 5 MG TABS tablet TAKE 1 TABLET BY MOUTH 2 TIMES DAILY.  [name] FIBER ADULT GUMMIES PO Take 1 Dose by mouth daily.  . fish oil-omega-3 fatty acids 1000 MG capsule Take 1 g by mouth daily.  [name] losartan (COZAAR) 25 MG tablet Take 1 tablet (25 mg total) by mouth daily.  . metoprolol tartrate (LOPRESSOR) 50 MG tablet TAKE 1 TABLET BY MOUTH 2 TIMES DAILY.  . mometasone (NASONEX) 50 MCG/ACT nasal spray Place 2 sprays into the nose daily as needed (for allergies).  . Multiple Vitamin (MULTIVITAMIN) capsule Take 1 capsule by mouth daily.  . naproxen sodium (ALEVE) 220 MG tablet Take 220 mg by mouth daily as needed.  . Pantoprazole Sodium (PROTONIX PO) Take by mouth as needed.  . Probiotic Product (PROBIOTIC DAILY PO) Take 1 tablet by mouth daily.  . vitamin C (ASCORBIC ACID) 500 MG tablet Take 500 mg by mouth daily.    Allergies: Augmentin [amoxicillin-pot clavulanate]   Review of Systems    Constitutional: Negative for chills, diaphoresis, fever, malaise/fatigue and weight loss.  HENT: Positive for sinus pain (recent sinus infection). Negative for congestion, sore throat and tinnitus.   Eyes: Negative for blurred vision, double vision and photophobia.  Respiratory: Negative for cough and wheezing.   Cardiovascular: Negative for chest pain and palpitations.  Gastrointestinal: Negative for blood in stool, diarrhea, nausea and vomiting.  Genitourinary: Negative for dysuria, frequency and urgency.  Musculoskeletal: Negative for joint pain and myalgias.  Skin: Negative for itching and rash.  Neurological: Negative for dizziness, focal weakness, weakness and headaches.  Endo/Heme/Allergies: Negative for environmental allergies and polydipsia. Does not bruise/bleed easily.  Psychiatric/Behavioral: Negative for depression and memory loss. The patient is not nervous/anxious and does not have insomnia.      Objective:   Blood pressure 134/78, pulse 63, height 5\' 3"  (1.6 m), weight 169 lb (76.7 kg), SpO2 96 %. Body mass index is 29.94 kg/m. General: Well Developed, well nourished, and in no acute distress.  Neuro: Alert and oriented x3, extra-ocular muscles intact, sensation grossly intact.  HEENT:[place]/AT, PERRLA, neck supple, No carotid bruits TM's- wnl's b/l;   Bilateral maxillary sinus tenderness.  OP- clr, No LAD, Nares- clear, erythema Skin: no gross rashes  Cardiac: Regular rate and rhythm Respiratory: Essentially clear to auscultation bilaterally. Not using accessory muscles, speaking in full sentences.  Abdominal: not grossly distended Musculoskeletal: Ambulates w/o diff, FROM * 4 ext.  Vasc: less 2 sec cap RF, warm and pink  Psych:  No HI/SI, judgement and insight good, Euthymic mood. Full Affect.

## 2017-11-02 NOTE — Patient Instructions (Addendum)
Please do sinus rinses two times daily, morning and night, followed by one spray each nostril of Flonase.  Finish all antibiotics, continue to drink extra fluids, and since it's hot/cold/hot/cold weather, allergy season is beginning.  Take allegra, claritin or zyrtec as needed.    Please realize, EXERCISE IS MEDICINE!  -  American Heart Association [facility]) guidelines for exercise : If you are in good health, without any medical conditions, you should engage in 150 minutes of moderate intensity aerobic activity per week.  This means you should be huffing and puffing throughout your workout.   Engaging in regular exercise will improve brain function and memory, as well as improve mood, boost immune system and help with weight management.  As well as the other, more well-known effects of exercise such as decreasing blood sugar levels, decreasing blood pressure,  and decreasing bad cholesterol levels/ increasing good cholesterol levels.     -  The AHA strongly endorses consumption of a diet that contains a variety of foods from all the food categories with an emphasis on fruits and vegetables; fat-free and low-fat dairy products; cereal and grain products; legumes and nuts; and fish, poultry, and/or extra lean meats.    Excessive food intake, especially of foods high in saturated and trans fats, sugar, and salt, should be avoided.    Adequate water intake of roughly 1/2 of your weight in pounds, should equal the ounces of water per day you should drink.  So for instance, if you're 200 pounds, that would be 100 ounces of water per day.         Mediterranean Diet  Why follow it? Research shows  Those who follow the Mediterranean diet have a reduced risk of heart disease   The diet is associated with a reduced incidence of Parkinson's and Alzheimer's diseases  People following the diet may have longer life expectancies and lower rates of chronic diseases   The Dietary Guidelines for Americans  recommends the Mediterranean diet as an eating plan to promote health and prevent disease  What Is the Mediterranean Diet?   Healthy eating plan based on typical foods and recipes of Mediterranean-style cooking  The diet is primarily a plant based diet; these foods should make up a majority of meals   Starches - Plant based foods should make up a majority of meals - They are an important sources of vitamins, minerals, energy, antioxidants, and fiber - Choose whole grains, foods high in fiber and minimally processed items  - Typical grain sources include wheat, oats, barley, corn, brown rice, bulgar, farro, millet, polenta, couscous  - Various types of beans include chickpeas, lentils, fava beans, black beans, white beans   Fruits  Veggies - Large quantities of antioxidant rich fruits & veggies; 6 or more servings  - Vegetables can be eaten raw or lightly drizzled with oil and cooked  - Vegetables common to the traditional Mediterranean Diet include: artichokes, arugula, beets, broccoli, brussel sprouts, cabbage, carrots, celery, collard greens, cucumbers, eggplant, kale, leeks, lemons, lettuce, mushrooms, okra, onions, peas, peppers, potatoes, pumpkin, radishes, rutabaga, shallots, spinach, sweet potatoes, turnips, zucchini - Fruits common to the Mediterranean Diet include: apples, apricots, avocados, cherries, clementines, dates, figs, grapefruits, grapes, melons, nectarines, oranges, peaches, pears, pomegranates, strawberries, tangerines  Fats - Replace butter and margarine with healthy oils, such as olive oil, canola oil, and tahini  - Limit nuts to no more than a handful a day  - Nuts include walnuts, almonds, pecans, pistachios, pine nuts  -  Limit or avoid candied, honey roasted or heavily salted nuts - Olives are central to the Mediterranean diet - can be eaten whole or used in a variety of dishes   Meats Protein - Limiting red meat: no more than a few times a month - When eating red  meat: choose lean cuts and keep the portion to the size of deck of cards - Eggs: approx. 0 to 4 times a week  - Fish and lean poultry: at least 2 a week  - Healthy protein sources include, chicken, [place], lean beef, lamb - Increase intake of seafood such as tuna, salmon, trout, mackerel, shrimp, scallops - Avoid or limit high fat processed meats such as sausage and bacon  Dairy - Include moderate amounts of low fat dairy products  - Focus on healthy dairy such as fat free yogurt, skim milk, low or reduced fat cheese - Limit dairy products higher in fat such as whole or 2% milk, cheese, ice cream  Alcohol - Moderate amounts of red wine is ok  - No more than 5 oz daily for women (all ages) and men older than age 15  - No more than 10 oz of wine daily for men younger than 37  Other - Limit sweets and other desserts  - Use herbs and spices instead of salt to flavor foods  - Herbs and spices common to the traditional Mediterranean Diet include: basil, bay leaves, chives, cloves, cumin, fennel, garlic, lavender, marjoram, mint, oregano, parsley, pepper, rosemary, sage, savory, sumac, tarragon, thyme   Its not just a diet, its a lifestyle:   The Mediterranean diet includes lifestyle factors typical of those in the region   Foods, drinks and meals are best eaten with others and savored  Daily physical activity is important for overall good health  This could be strenuous exercise like running and aerobics  This could also be more leisurely activities such as walking, housework, yard-work, or taking the stairs  Moderation is the key; a balanced and healthy diet accommodates most foods and drinks  Consider portion sizes and frequency of consumption of certain foods   Meal Ideas & Options:   Breakfast:  o Whole wheat toast or whole wheat English muffins with peanut butter & hard boiled egg o Steel cut oats topped with apples & cinnamon and skim milk  o Fresh fruit: banana, strawberries,  melon, berries, peaches  o Smoothies: strawberries, bananas, greek yogurt, peanut butter o Low fat greek yogurt with blueberries and granola  o Egg white omelet with spinach and mushrooms o Breakfast couscous: whole wheat couscous, apricots, skim milk, cranberries   Sandwiches:  o Hummus and grilled vegetables (peppers, zucchini, squash) on whole wheat bread   o Grilled chicken on whole wheat pita with lettuce, tomatoes, cucumbers or tzatziki  o Tuna salad on whole wheat bread: tuna salad made with greek yogurt, olives, red peppers, capers, green onions o Garlic rosemary lamb pita: lamb sauted with garlic, rosemary, salt & pepper; add lettuce, cucumber, greek yogurt to pita - flavor with lemon juice and black pepper   Seafood:  o Mediterranean grilled salmon, seasoned with garlic, basil, parsley, lemon juice and black pepper o Shrimp, lemon, and spinach whole-grain pasta salad made with low fat greek yogurt  o Seared scallops with lemon orzo  o Seared tuna steaks seasoned salt, pepper, coriander topped with tomato mixture of olives, tomatoes, olive oil, minced garlic, parsley, green onions and cappers   Meats:  o Herbed greek chicken salad  with kalamata olives, cucumber, feta  o Red bell peppers stuffed with spinach, bulgur, lean ground beef (or lentils) & topped with feta   o Kebabs: skewers of chicken, tomatoes, onions, zucchini, squash  o [place] burgers: made with red onions, mint, dill, lemon juice, feta cheese topped with roasted red peppers  Vegetarian o Cucumber salad: cucumbers, artichoke hearts, celery, red onion, feta cheese, tossed in olive oil & lemon juice  o Hummus and whole grain pita points with a greek salad (lettuce, tomato, feta, olives, cucumbers, red onion) o Lentil soup with celery, carrots made with vegetable broth, garlic, salt and pepper  o Tabouli salad: parsley, bulgur, mint, scallions, cucumbers, tomato, radishes, lemon juice, olive oil, salt and pepper.

## 2017-11-03 DIAGNOSIS — M9903 Segmental and somatic dysfunction of lumbar region: Secondary | ICD-10-CM | POA: Diagnosis not present

## 2017-11-03 DIAGNOSIS — M9902 Segmental and somatic dysfunction of thoracic region: Secondary | ICD-10-CM | POA: Diagnosis not present

## 2017-11-03 DIAGNOSIS — M9901 Segmental and somatic dysfunction of cervical region: Secondary | ICD-10-CM | POA: Diagnosis not present

## 2017-11-03 DIAGNOSIS — M4302 Spondylolysis, cervical region: Secondary | ICD-10-CM | POA: Diagnosis not present

## 2017-11-03 DIAGNOSIS — M4303 Spondylolysis, cervicothoracic region: Secondary | ICD-10-CM | POA: Diagnosis not present

## 2017-11-19 ENCOUNTER — Ambulatory Visit (INDEPENDENT_AMBULATORY_CARE_PROVIDER_SITE_OTHER): Payer: Medicare Other | Admitting: Family Medicine

## 2017-11-19 ENCOUNTER — Encounter: Payer: Self-pay | Admitting: Family Medicine

## 2017-11-19 VITALS — BP 135/78 | HR 58 | Temp 97.8°F | Ht 63.0 in | Wt 171.0 lb

## 2017-11-19 DIAGNOSIS — M8580 Other specified disorders of bone density and structure, unspecified site: Secondary | ICD-10-CM | POA: Insufficient documentation

## 2017-11-19 DIAGNOSIS — K1120 Sialoadenitis, unspecified: Secondary | ICD-10-CM | POA: Diagnosis not present

## 2017-11-19 DIAGNOSIS — I4891 Unspecified atrial fibrillation: Secondary | ICD-10-CM

## 2017-11-19 DIAGNOSIS — E559 Vitamin D deficiency, unspecified: Secondary | ICD-10-CM | POA: Diagnosis not present

## 2017-11-19 DIAGNOSIS — R5383 Other fatigue: Secondary | ICD-10-CM

## 2017-11-19 DIAGNOSIS — K118 Other diseases of salivary glands: Secondary | ICD-10-CM | POA: Diagnosis not present

## 2017-11-19 DIAGNOSIS — K5909 Other constipation: Secondary | ICD-10-CM

## 2017-11-19 DIAGNOSIS — E782 Mixed hyperlipidemia: Secondary | ICD-10-CM

## 2017-11-19 DIAGNOSIS — I10 Essential (primary) hypertension: Secondary | ICD-10-CM | POA: Diagnosis not present

## 2017-11-19 DIAGNOSIS — M810 Age-related osteoporosis without current pathological fracture: Secondary | ICD-10-CM | POA: Diagnosis not present

## 2017-11-19 NOTE — Progress Notes (Signed)
Pt here for an acute care OV today   Impression and Recommendations:    1. Sialadenitis   2. Parotid duct obstruction- R   3. Vitamin D deficiency   4. Chronic constipation   5. Mixed hyperlipidemia   6. Essential hypertension   7. Other fatigue   8. Atrial fibrillation with RVR ([place])   9. Postmenopausal bone loss     1. Sialadenitis--drink adequate amounts of water per day, equal to one half of your body weight in oz.   2. Parotid duct obstruction-- --suck on sour or citrusy candies or foods (sugar-free lemon drop or lemon juice) that will produce saliva.  -Follow up in near future if symptoms worsen, if the area is swollen or enflamed.  3. Vitamin D deficiency- check labs 4. Chronic constipation-- labs ordered today. 5. Mixed HLD- check labs 6. Essential HTN- continue meds check labs.   Orders Placed This Encounter  Procedures  . Comprehensive metabolic panel  . Lipid panel  . Hemoglobin A1c  . TSH  . VITAMIN D 25 Hydroxy (Vit-D Deficiency, Fractures)  . Vitamin B12  . CBC with Differential/Platelet     Education and routine counseling performed. Handouts provided  Gross side effects, risk and benefits, and alternatives of medications and treatment plan in general discussed with patient.  Patient is aware that all medications have potential side effects and we are unable to predict every side effect or drug-drug interaction that may occur.   Patient will call with any questions prior to using medication if they have concerns.  Expresses verbal understanding and consents to current therapy and treatment regimen.  No barriers to understanding were identified.  Red flag symptoms and signs discussed in detail.  Patient expressed understanding regarding what to do in case of emergency\urgent symptoms   Please see AVS handed out to patient at the end of our visit for further patient instructions/ counseling done pertaining to today's office visit.   Return for  obtain FBW today and f/up near future to discuss if desired.; o/w 59mo for MWE.     Note: This document was prepared occasionally using Dragon voice recognition software and may include unintentional dictation errors in addition to a scribe.   This document serves as a record of services personally performed by Mellody Dance, DO. It was created on her behalf by Mayer Masker, a trained medical scribe. The creation of this record is based on the scribe's personal observations and the provider's statements to them.   I have reviewed the above medical documentation for accuracy and completeness and I concur.  Mellody Dance 11/19/17 10:01 AM  --------------------------------------------------------------------------------------------------------------------------------------------------------------------------------------------------------------------------------------------    Subjective:    CC:  Chief Complaint  Patient presents with  . Facial Pain    knot, swelling, tenderness roght jaw comes and goes x 1 month    HPI: Kelli [name] is a 77 y.o. female who presents to [facility] today for issues as discussed below.  Pt complains of mild R-sided facial pain that began 1 month ago. Her pain comes and goes. She states there is a small lump in the area that she describes as an oval. She felt this right after she was diagnosed with strep throat around that time. She took two courses of abx and one course of prednisone for her strep throat diagnosis. She states a nurse at work felt this lump and recommended she should come to the office to have it checked out. She  states her tongue feels funny at times and her mouth feels dry. She does not drink enough water a day, but she tries.   Problem  Postmenopausal Bone Loss  Chronic Constipation     Wt Readings from Last 3 Encounters:  11/19/17 171 lb (77.6 kg)  11/02/17 169 lb (76.7 kg)  10/12/17 169 lb (76.7  kg)   BP Readings from Last 3 Encounters:  11/19/17 135/78  11/02/17 134/78  10/12/17 (!) 160/72   BMI Readings from Last 3 Encounters:  11/19/17 30.29 kg/m  11/02/17 29.94 kg/m  10/12/17 29.94 kg/m     Patient Care Team    Relationship Specialty Notifications Start End  Mellody Dance, DO PCP - General Family Medicine  11/02/17   [place], Peter M, MD Consulting Physician Cardiology  11/02/17   Allyn Kenner, MD Consulting Physician Dermatology  11/02/17    Comment: see's them yrly- Dr Theda Sers is who she actually sees  Wonda Horner, MD Consulting Physician Gastroenterology  11/02/17      Patient Active Problem List   Diagnosis Date Noted  . NSTEMI (non-ST elevated myocardial infarction) ([place]) 03/10/2013    Priority: High  . Postmenopausal bone loss 11/19/2017  . Hyperlipidemia 11/02/2017  . Gastroesophageal reflux disease 11/02/2017  . Allergic rhinitis due to pollen 11/02/2017  . Environmental and seasonal allergies 11/02/2017  . Vitamin D deficiency 11/02/2017  . HTN (hypertension) 03/10/2014  . Perforated diverticulitis 12/24/2013  . Chronic constipation 12/24/2013  . Fatigue 08/18/2013  . Atrial fibrillation with RVR ([place]) 03/09/2013  . Elevated troponin 03/09/2013    Past Medical history, Surgical history, Family history, Social history, Allergies and Medications have been entered into the medical record, reviewed and changed as needed.    Current Meds  Medication Sig  . acetaminophen (TYLENOL) 500 MG tablet Take 500 mg by mouth daily as needed for pain.  [name] atorvastatin (LIPITOR) 10 MG tablet Take 5 mg by mouth daily.   . Calcium Carb-Cholecalciferol (CALCIUM + D3) 600-200 MG-UNIT TABS Take 1 tablet by mouth daily.  . Cholecalciferol (VITAMIN D) 400 UNITS capsule Take 400 Units by mouth daily.  [name] Calcium (STOOL SOFTENER PO) Take by mouth at bedtime.  [name] ELIQUIS 5 MG TABS tablet TAKE 1 TABLET BY MOUTH 2 TIMES DAILY.  [name] FIBER ADULT GUMMIES PO Take 1 Dose  by mouth daily.  . fish oil-omega-3 fatty acids 1000 MG capsule Take 1 g by mouth daily.  [name] losartan (COZAAR) 25 MG tablet Take 1 tablet (25 mg total) by mouth daily.  . metoprolol tartrate (LOPRESSOR) 50 MG tablet TAKE 1 TABLET BY MOUTH 2 TIMES DAILY.  . mometasone (NASONEX) 50 MCG/ACT nasal spray Place 2 sprays into the nose daily as needed (for allergies).  . Multiple Vitamin (MULTIVITAMIN) capsule Take 1 capsule by mouth daily.  . naproxen sodium (ALEVE) 220 MG tablet Take 220 mg by mouth daily as needed.  . Pantoprazole Sodium (PROTONIX PO) Take by mouth as needed.  . Probiotic Product (PROBIOTIC DAILY PO) Take 1 tablet by mouth daily.  . vitamin C (ASCORBIC ACID) 500 MG tablet Take 500 mg by mouth daily.    Allergies:  Allergies  Allergen Reactions  . Augmentin [Amoxicillin-Pot Clavulanate] Itching and Rash     Review of Systems: General:   Denies fever, chills, unexplained weight loss.  Optho/Auditory:   Denies visual changes, blurred vision/LOV Respiratory:   Denies wheeze, DOE more than baseline levels.  Cardiovascular:   Denies chest pain, palpitations, new onset peripheral  edema  Gastrointestinal:   Denies nausea, vomiting, diarrhea, abd pain.  Genitourinary: Denies dysuria, freq/ urgency, flank pain or discharge from genitals.  Endocrine:     Denies hot or cold intolerance, polyuria, polydipsia. Musculoskeletal:   Denies unexplained myalgias, joint swelling, unexplained arthralgias, gait problems.  Skin:  Denies new onset rash, suspicious lesions Neurological:     Denies dizziness, unexplained weakness, numbness  Psychiatric/Behavioral:   Denies mood changes, suicidal or homicidal ideations, hallucinations    Objective:   Blood pressure 135/78, pulse (!) 58, temperature 97.8 F (36.6 C), height 5\' 3"  (1.6 m), weight 171 lb (77.6 kg), SpO2 97 %. Body mass index is 30.29 kg/m. General:  Well Developed, well nourished, appropriate for stated age.  Neuro:  Alert and  oriented,  extra-ocular muscles intact  HEENT:  Normocephalic, atraumatic, neck supple Mouth: Not erythematous or hot. Mild swollen r prominents along r mandibular angle slight prominents over region of R parotid gland. Skin:  no gross rash, warm, pink. Cardiac:  RRR, S1 S2 Respiratory:  ECTA B/L and A/P, Not using accessory muscles, speaking in full sentences- unlabored. Vascular:  Ext warm, no cyanosis apprec.; cap RF less 2 sec. Psych:  No HI/SI, judgement and insight good, Euthymic mood. Full Affect.

## 2017-11-19 NOTE — Patient Instructions (Signed)
Please suck on things that make you produce a lot of saliva such as sour patch kids, lemon drops etc.  Also you must drink adequate amounts of water.  Even once the fullness and pain does go down I want you to continue this regiment of at least 80 ounces of water per day and sucking on foods or products that produce a lot of saliva for at least 5-7 days after you do notice no more symptoms.  If for whatever reason it does not improve with the above mentioned treatment plan, please follow-up with [place] as we may need additional imaging etc.

## 2017-11-20 LAB — CBC WITH DIFFERENTIAL/PLATELET
BASOS ABS: 0 10*3/uL (ref 0.0–0.2)
Basos: 0 %
EOS (ABSOLUTE): 0.3 10*3/uL (ref 0.0–0.4)
Eos: 4 %
Hematocrit: 40 % (ref 34.0–46.6)
Hemoglobin: 13.3 g/dL (ref 11.1–15.9)
Immature Grans (Abs): 0 10*3/uL (ref 0.0–0.1)
Immature Granulocytes: 0 %
LYMPHS: 16 %
Lymphocytes Absolute: 1.5 10*3/uL (ref 0.7–3.1)
MCH: 30 pg (ref 26.6–33.0)
MCHC: 33.3 g/dL (ref 31.5–35.7)
MCV: 90 fL (ref 79–97)
Monocytes Absolute: 1 10*3/uL — ABNORMAL HIGH (ref 0.1–0.9)
Monocytes: 11 %
NEUTROS ABS: 6.4 10*3/uL (ref 1.4–7.0)
Neutrophils: 69 %
PLATELETS: 220 10*3/uL (ref 150–379)
RBC: 4.43 x10E6/uL (ref 3.77–5.28)
RDW: 14 % (ref 12.3–15.4)
WBC: 9.3 10*3/uL (ref 3.4–10.8)

## 2017-11-20 LAB — LIPID PANEL
CHOL/HDL RATIO: 3.4 ratio (ref 0.0–4.4)
Cholesterol, Total: 169 mg/dL (ref 100–199)
HDL: 50 mg/dL (ref >39)
LDL CALC: 97 mg/dL (ref 0–99)
Triglycerides: 108 mg/dL (ref 0–149)
VLDL CHOLESTEROL CAL: 22 mg/dL (ref 5–40)

## 2017-11-20 LAB — HEMOGLOBIN A1C
Est. average glucose Bld gHb Est-mCnc: 117 mg/dL
HEMOGLOBIN A1C: 5.7 % — AB (ref 4.8–5.6)

## 2017-11-20 LAB — COMPREHENSIVE METABOLIC PANEL
ALBUMIN: 3.8 g/dL (ref 3.5–4.8)
ALK PHOS: 67 IU/L (ref 39–117)
ALT: 29 IU/L (ref 0–32)
AST: 31 IU/L (ref 0–40)
Albumin/Globulin Ratio: 1.4 (ref 1.2–2.2)
BUN / CREAT RATIO: 24 (ref 12–28)
BUN: 25 mg/dL (ref 8–27)
Bilirubin Total: 0.5 mg/dL (ref 0.0–1.2)
CO2: 23 mmol/L (ref 20–29)
CREATININE: 1.05 mg/dL — AB (ref 0.57–1.00)
Calcium: 8.7 mg/dL (ref 8.7–10.3)
Chloride: 103 mmol/L (ref 96–106)
GFR calc non Af Amer: 51 mL/min/{1.73_m2} — ABNORMAL LOW (ref >59)
GFR, EST AFRICAN AMERICAN: 59 mL/min/{1.73_m2} — AB (ref >59)
GLUCOSE: 91 mg/dL (ref 65–99)
Globulin, Total: 2.7 g/dL (ref 1.5–4.5)
Potassium: 5.3 mmol/L — ABNORMAL HIGH (ref 3.5–5.2)
SODIUM: 140 mmol/L (ref 134–144)
TOTAL PROTEIN: 6.5 g/dL (ref 6.0–8.5)

## 2017-11-20 LAB — VITAMIN D 25 HYDROXY (VIT D DEFICIENCY, FRACTURES): VIT D 25 HYDROXY: 48.3 ng/mL (ref 30.0–100.0)

## 2017-11-20 LAB — VITAMIN B12: VITAMIN B 12: 663 pg/mL (ref 232–1245)

## 2017-11-20 LAB — TSH: TSH: 1.02 u[IU]/mL (ref 0.450–4.500)

## 2017-11-30 ENCOUNTER — Telehealth: Payer: Self-pay | Admitting: Family Medicine

## 2017-11-30 ENCOUNTER — Other Ambulatory Visit: Payer: Self-pay

## 2017-11-30 DIAGNOSIS — I10 Essential (primary) hypertension: Secondary | ICD-10-CM

## 2017-11-30 DIAGNOSIS — R7303 Prediabetes: Secondary | ICD-10-CM

## 2017-11-30 DIAGNOSIS — I4891 Unspecified atrial fibrillation: Secondary | ICD-10-CM

## 2017-11-30 DIAGNOSIS — E875 Hyperkalemia: Secondary | ICD-10-CM

## 2017-11-30 DIAGNOSIS — R7309 Other abnormal glucose: Secondary | ICD-10-CM

## 2017-11-30 NOTE — Telephone Encounter (Signed)
Patient was called with lab result, please see note attached to results. MPulliam, CMA/RT(R)

## 2017-11-30 NOTE — Telephone Encounter (Signed)
Patient called office to speak w/ Melissa--forwarding message she returned medical assistant's call.   --glh

## 2017-11-30 NOTE — Progress Notes (Signed)
Per lab result notes, place future orders for labs.   From lab result note. "-With that potassium elevated and worsening serum creatinine and I recommend we recheck a BMP in 3-4 months.  Tell her to please call and make this appointment so she does not forget about it.   -She is now prediabetic as her A1c is 5.7 and in the slightly abnormal range. I recommend we recheck this in 4-6 months."  MPulliam, CMA/RT(R)

## 2017-12-01 DIAGNOSIS — M4302 Spondylolysis, cervical region: Secondary | ICD-10-CM | POA: Diagnosis not present

## 2017-12-01 DIAGNOSIS — M9902 Segmental and somatic dysfunction of thoracic region: Secondary | ICD-10-CM | POA: Diagnosis not present

## 2017-12-01 DIAGNOSIS — M9901 Segmental and somatic dysfunction of cervical region: Secondary | ICD-10-CM | POA: Diagnosis not present

## 2017-12-01 DIAGNOSIS — M4303 Spondylolysis, cervicothoracic region: Secondary | ICD-10-CM | POA: Diagnosis not present

## 2017-12-01 DIAGNOSIS — M9903 Segmental and somatic dysfunction of lumbar region: Secondary | ICD-10-CM | POA: Diagnosis not present

## 2017-12-22 ENCOUNTER — Ambulatory Visit (INDEPENDENT_AMBULATORY_CARE_PROVIDER_SITE_OTHER): Payer: Medicare Other | Admitting: Family Medicine

## 2017-12-22 ENCOUNTER — Encounter: Payer: Self-pay | Admitting: Family Medicine

## 2017-12-22 VITALS — BP 134/80 | HR 60 | Ht 63.0 in | Wt 165.6 lb

## 2017-12-22 DIAGNOSIS — Z0000 Encounter for general adult medical examination without abnormal findings: Secondary | ICD-10-CM | POA: Diagnosis not present

## 2017-12-22 NOTE — Patient Instructions (Addendum)
Please continue to avoid potassium rich foods and potassium rich salts.   We need to recheck your kidney panel in 3 months from prior which was in March.  This to be around [place].   Please make a follow-up appointment for lab only visit then 2 days later or so make an appointment with me so we can review these results.   Although your losartan can increase potassium levels, at this point I do not think we need to change medications but please just try to avoid potassium rich foods.  Preventive Care for Adults, Female  A healthy lifestyle and preventive care can promote health and wellness. Preventive health guidelines for women include the following key practices.   A routine yearly physical is a good way to check with your health care provider about your health and preventive screening. It is a chance to share any concerns and updates on your health and to receive a thorough exam.   Visit your dentist for a routine exam and preventive care every 6 months. Brush your teeth twice a day and floss once a day. Good oral hygiene prevents tooth decay and gum disease.   The frequency of eye exams is based on your age, health, family medical history, use of contact lenses, and other factors. Follow your health care provider's recommendations for frequency of eye exams.   Eat a healthy diet. Foods like vegetables, fruits, whole grains, low-fat dairy products, and lean protein foods contain the nutrients you need without too many calories. Decrease your intake of foods high in solid fats, added sugars, and salt. Eat the right amount of calories for you.Get information about a proper diet from your health care provider, if necessary.   Regular physical exercise is one of the most important things you can do for your health. Most adults should get at least 150 minutes of moderate-intensity exercise (any activity that increases your heart rate and causes you to sweat) each week. In addition, most adults need  muscle-strengthening exercises on 2 or more days a week.   Maintain a healthy weight. The body mass index (BMI) is a screening tool to identify possible weight problems. It provides an estimate of body fat based on height and weight. Your health care provider can find your BMI, and can help you achieve or maintain a healthy weight.For adults 20 years and older:   - A BMI below 18.5 is considered underweight.   - A BMI of 18.5 to 24.9 is normal.   - A BMI of 25 to 29.9 is considered overweight.   - A BMI of 30 and above is considered obese.   Maintain normal blood lipids and cholesterol levels by exercising and minimizing your intake of trans and saturated fats.  Eat a balanced diet with plenty of fruit and vegetables. Blood tests for lipids and cholesterol should begin at age 44 and be repeated every 5 years minimum.  If your lipid or cholesterol levels are high, you are over 40, or you are at high risk for heart disease, you may need your cholesterol levels checked more frequently.Ongoing high lipid and cholesterol levels should be treated with medicines if diet and exercise are not working.   If you smoke, find out from your health care provider how to quit. If you do not use tobacco, do not start.   Lung cancer screening is recommended for adults aged 36-80 years who are at high risk for developing lung cancer because of a history of smoking. A  yearly low-dose CT scan of the lungs is recommended for people who have at least a 30-pack-year history of smoking and are a current smoker or have quit within the past 15 years. A pack year of smoking is smoking an average of 1 pack of cigarettes a day for 1 year (for example: 1 pack a day for 30 years or 2 packs a day for 15 years). Yearly screening should continue until the smoker has stopped smoking for at least 15 years. Yearly screening should be stopped for people who develop a health problem that would prevent them from having lung cancer  treatment.   If you are pregnant, do not drink alcohol. If you are breastfeeding, be very cautious about drinking alcohol. If you are not pregnant and choose to drink alcohol, do not have more than 1 drink per day. One drink is considered to be 12 ounces (355 mL) of beer, 5 ounces (148 mL) of wine, or 1.5 ounces (44 mL) of liquor.   Avoid use of street drugs. Do not share needles with anyone. Ask for help if you need support or instructions about stopping the use of drugs.   High blood pressure causes heart disease and increases the risk of stroke. Your blood pressure should be checked at least yearly.  Ongoing high blood pressure should be treated with medicines if weight loss and exercise do not work.   If you are 17-44 years old, ask your health care provider if you should take aspirin to prevent strokes.   Diabetes screening involves taking a blood sample to check your fasting blood sugar level. This should be done once every 3 years, after age 65, if you are within normal weight and without risk factors for diabetes. Testing should be considered at a younger age or be carried out more frequently if you are overweight and have at least 1 risk factor for diabetes.   Breast cancer screening is essential preventive care for women. You should practice "breast self-awareness."  This means understanding the normal appearance and feel of your breasts and may include breast self-examination.  Any changes detected, no matter how small, should be reported to a health care provider.  Women in their 59s and 30s should have a clinical breast exam (CBE) by a health care provider as part of a regular health exam every 1 to 3 years.  After age 58, women should have a CBE every year.  Starting at age 33, women should consider having a mammogram (breast X-ray test) every year.  Women who have a family history of breast cancer should talk to their health care provider about genetic screening.  Women at a high  risk of breast cancer should talk to their health care providers about having an MRI and a mammogram every year.   -Breast cancer gene (BRCA)-related cancer risk assessment is recommended for women who have family members with BRCA-related cancers. BRCA-related cancers include breast, ovarian, tubal, and peritoneal cancers. Having family members with these cancers may be associated with an increased risk for harmful changes (mutations) in the breast cancer genes BRCA1 and BRCA2. Results of the assessment will determine the need for genetic counseling and BRCA1 and BRCA2 testing.   The Pap test is a screening test for cervical cancer. A Pap test can show cell changes on the cervix that might become cervical cancer if left untreated. A Pap test is a procedure in which cells are obtained and examined from the lower end of the uterus (cervix).   -  Women should have a Pap test starting at age 85.   - Between ages 26 and 85, Pap tests should be repeated every 2 years.   - Beginning at age 11, you should have a Pap test every 3 years as long as the past 3 Pap tests have been normal.   - Some women have medical problems that increase the chance of getting cervical cancer. Talk to your health care provider about these problems. It is especially important to talk to your health care provider if a new problem develops soon after your last Pap test. In these cases, your health care provider may recommend more frequent screening and Pap tests.   - The above recommendations are the same for women who have or have not gotten the vaccine for human papillomavirus (HPV).   - If you had a hysterectomy for a problem that was not cancer or a condition that could lead to cancer, then you no longer need Pap tests. Even if you no longer need a Pap test, a regular exam is a good idea to make sure no other problems are starting.   - If you are between ages 24 and 26 years, and you have had normal Pap tests going back 10  years, you no longer need Pap tests. Even if you no longer need a Pap test, a regular exam is a good idea to make sure no other problems are starting.   - If you have had past treatment for cervical cancer or a condition that could lead to cancer, you need Pap tests and screening for cancer for at least 20 years after your treatment.   - If Pap tests have been discontinued, risk factors (such as a new sexual partner) need to be reassessed to determine if screening should be resumed.   - The HPV test is an additional test that may be used for cervical cancer screening. The HPV test looks for the virus that can cause the cell changes on the cervix. The cells collected during the Pap test can be tested for HPV. The HPV test could be used to screen women aged 76 years and older, and should be used in women of any age who have unclear Pap test results. After the age of 67, women should have HPV testing at the same frequency as a Pap test.   Colorectal cancer can be detected and often prevented. Most routine colorectal cancer screening begins at the age of 51 years and continues through age 36 years. However, your health care provider may recommend screening at an earlier age if you have risk factors for colon cancer. On a yearly basis, your health care provider may provide home test kits to check for hidden blood in the stool.  Use of a small camera at the end of a tube, to directly examine the colon (sigmoidoscopy or colonoscopy), can detect the earliest forms of colorectal cancer. Talk to your health care provider about this at age 40, when routine screening begins. Direct exam of the colon should be repeated every 5 -10 years through age 46 years, unless early forms of pre-cancerous polyps or small growths are found.   People who are at an increased risk for hepatitis B should be screened for this virus. You are considered at high risk for hepatitis B if:  -You were born in a country where hepatitis B  occurs often. Talk with your health care provider about which countries are considered high risk.  - Your parents  were born in a high-risk country and you have not received a shot to protect against hepatitis B (hepatitis B vaccine).  - You have HIV or AIDS.  - You use needles to inject street drugs.  - You live with, or have sex with, someone who has Hepatitis B.  - You get hemodialysis treatment.  - You take certain medicines for conditions like cancer, organ transplantation, and autoimmune conditions.   Hepatitis C blood testing is recommended for all people born from [birth year] through 1966 and any individual with known risks for hepatitis C.   Practice safe sex. Use condoms and avoid high-risk sexual practices to reduce the spread of sexually transmitted infections (STIs). STIs include gonorrhea, chlamydia, syphilis, trichomonas, herpes, HPV, and human immunodeficiency virus (HIV). Herpes, HIV, and HPV are viral illnesses that have no cure. They can result in disability, cancer, and death. Sexually active women aged 86 years and younger should be checked for chlamydia. Older women with new or multiple partners should also be tested for chlamydia. Testing for other STIs is recommended if you are sexually active and at increased risk.   Osteoporosis is a disease in which the bones lose minerals and strength with aging. This can result in serious bone fractures or breaks. The risk of osteoporosis can be identified using a bone density scan. Women ages 82 years and over and women at risk for fractures or osteoporosis should discuss screening with their health care providers. Ask your health care provider whether you should take a calcium supplement or vitamin D to There are also several preventive steps women can take to avoid osteoporosis and resulting fractures or to keep osteoporosis from worsening. -->Recommendations include:  Eat a balanced diet high in fruits, vegetables, calcium, and  vitamins.  Get enough calcium. The recommended total intake of is 1,200 mg daily; for best absorption, if taking supplements, divide doses into 250-500 mg doses throughout the day. Of the two types of calcium, calcium carbonate is best absorbed when taken with food but calcium citrate can be taken on an empty stomach.  Get enough vitamin D. [employer] and the [employer] recommend at least 1,000 IU per day for women age 75 and over who are at risk of vitamin D deficiency. Vitamin D deficiency can be caused by inadequate sun exposure (for example, those who live in [place]).  Avoid alcohol and smoking. Heavy alcohol intake (more than 7 drinks per week) increases the risk of falls and hip fracture and women smokers tend to lose bone more rapidly and have lower bone mass than nonsmokers. Stopping smoking is one of the most important changes women can make to improve their health and decrease risk for disease.  Be physically active every day. Weight-bearing exercise (for example, fast walking, hiking, jogging, and weight training) may strengthen bones or slow the rate of bone loss that comes with aging. Balancing and muscle-strengthening exercises can reduce the risk of falling and fracture.  Consider therapeutic medications. Currently, several types of effective drugs are available. Healthcare providers can recommend the type most appropriate for each woman.  Eliminate environmental factors that may contribute to accidents. Falls cause nearly 90% of all osteoporotic fractures, so reducing this risk is an important bone-health strategy. Measures include ample lighting, removing obstructions to walking, using nonskid rugs on floors, and placing mats and/or grab bars in showers.  Be aware of medication side effects. Some common medicines make bones weaker. These include a type of steroid drug called glucocorticoids used  for arthritis and asthma, some antiseizure drugs, certain  sleeping pills, treatments for endometriosis, and some cancer drugs. An overactive thyroid gland or using too much thyroid hormone for an underactive thyroid can also be a problem. If you are taking these medicines, talk to your doctor about what you can do to help protect your bones.reduce the rate of osteoporosis.    Menopause can be associated with physical symptoms and risks. Hormone replacement therapy is available to decrease symptoms and risks. You should talk to your health care provider about whether hormone replacement therapy is right for you.   Use sunscreen. Apply sunscreen liberally and repeatedly throughout the day. You should seek shade when your shadow is shorter than you. Protect yourself by wearing long sleeves, pants, a wide-brimmed hat, and sunglasses year round, whenever you are outdoors.   Once a month, do a whole body skin exam, using a mirror to look at the skin on your back. Tell your health care provider of new moles, moles that have irregular borders, moles that are larger than a pencil eraser, or moles that have changed in shape or color.   -Stay current with required vaccines (immunizations).   Influenza vaccine. All adults should be immunized every year.  Tetanus, diphtheria, and acellular pertussis (Td, Tdap) vaccine. Pregnant women should receive 1 dose of Tdap vaccine during each pregnancy. The dose should be obtained regardless of the length of time since the last dose. Immunization is preferred during the 27th 36th week of gestation. An adult who has not previously received Tdap or who does not know her vaccine status should receive 1 dose of Tdap. This initial dose should be followed by tetanus and diphtheria toxoids (Td) booster doses every 10 years. Adults with an unknown or incomplete history of completing a 3-dose immunization series with Td-containing vaccines should begin or complete a primary immunization series including a Tdap dose. Adults should  receive a Td booster every 10 years.  Varicella vaccine. An adult without evidence of immunity to varicella should receive 2 doses or a second dose if she has previously received 1 dose. Pregnant females who do not have evidence of immunity should receive the first dose after pregnancy. This first dose should be obtained before leaving the health care facility. The second dose should be obtained 4 8 weeks after the first dose.  Human papillomavirus (HPV) vaccine. Females aged 66 26 years who have not received the vaccine previously should obtain the 3-dose series. The vaccine is not recommended for use in pregnant females. However, pregnancy testing is not needed before receiving a dose. If a female is found to be pregnant after receiving a dose, no treatment is needed. In that case, the remaining doses should be delayed until after the pregnancy. Immunization is recommended for any person with an immunocompromised condition through the age of 35 years if she did not get any or all doses earlier. During the 3-dose series, the second dose should be obtained 4 8 weeks after the first dose. The third dose should be obtained 24 weeks after the first dose and 16 weeks after the second dose.  Zoster vaccine. One dose is recommended for adults aged 39 years or older unless certain conditions are present.  Measles, mumps, and rubella (MMR) vaccine. Adults born before 11 generally are considered immune to measles and mumps. Adults born in [birth year] or later should have 1 or more doses of MMR vaccine unless there is a contraindication to the vaccine or there is laboratory  evidence of immunity to each of the three diseases. A routine second dose of MMR vaccine should be obtained at least 28 days after the first dose for students attending postsecondary schools, health care workers, or international travelers. People who received inactivated measles vaccine or an unknown type of measles vaccine during 1963 1967 should  receive 2 doses of MMR vaccine. People who received inactivated mumps vaccine or an unknown type of mumps vaccine before 1979 and are at high risk for mumps infection should consider immunization with 2 doses of MMR vaccine. For females of childbearing age, rubella immunity should be determined. If there is no evidence of immunity, females who are not pregnant should be vaccinated. If there is no evidence of immunity, females who are pregnant should delay immunization until after pregnancy. Unvaccinated health care workers born before 26 who lack laboratory evidence of measles, mumps, or rubella immunity or laboratory confirmation of disease should consider measles and mumps immunization with 2 doses of MMR vaccine or rubella immunization with 1 dose of MMR vaccine.  Pneumococcal 13-valent conjugate (PCV13) vaccine. When indicated, a person who is uncertain of her immunization history and has no record of immunization should receive the PCV13 vaccine. An adult aged 79 years or older who has certain medical conditions and has not been previously immunized should receive 1 dose of PCV13 vaccine. This PCV13 should be followed with a dose of pneumococcal polysaccharide (PPSV23) vaccine. The PPSV23 vaccine dose should be obtained at least 8 weeks after the dose of PCV13 vaccine. An adult aged 44 years or older who has certain medical conditions and previously received 1 or more doses of PPSV23 vaccine should receive 1 dose of PCV13. The PCV13 vaccine dose should be obtained 1 or more years after the last PPSV23 vaccine dose.  Pneumococcal polysaccharide (PPSV23) vaccine. When PCV13 is also indicated, PCV13 should be obtained first. All adults aged 30 years and older should be immunized. An adult younger than age 22 years who has certain medical conditions should be immunized. Any person who resides in a nursing home or long-term care facility should be immunized. An adult smoker should be immunized. People with an  immunocompromised condition and certain other conditions should receive both PCV13 and PPSV23 vaccines. People with human immunodeficiency virus (HIV) infection should be immunized as soon as possible after diagnosis. Immunization during chemotherapy or radiation therapy should be avoided. Routine use of PPSV23 vaccine is not recommended for American Indians, [place] Natives, or people younger than 65 years unless there are medical conditions that require PPSV23 vaccine. When indicated, people who have unknown immunization and have no record of immunization should receive PPSV23 vaccine. One-time revaccination 5 years after the first dose of PPSV23 is recommended for people aged 71 64 years who have chronic kidney failure, nephrotic syndrome, asplenia, or immunocompromised conditions. People who received 1 2 doses of PPSV23 before age 63 years should receive another dose of PPSV23 vaccine at age 101 years or later if at least 5 years have passed since the previous dose. Doses of PPSV23 are not needed for people immunized with PPSV23 at or after age 22 years.  Meningococcal vaccine. Adults with asplenia or persistent complement component deficiencies should receive 2 doses of quadrivalent meningococcal conjugate (MenACWY-D) vaccine. The doses should be obtained at least 2 months apart. Microbiologists working with certain meningococcal bacteria, [employer] recruits, people at risk during an outbreak, and people who travel to or live in countries with a high rate of meningitis should be immunized.  A first-year college student up through age 67 years who is living in a residence hall should receive a dose if she did not receive a dose on or after her 16th birthday. Adults who have certain high-risk conditions should receive one or more doses of vaccine.  Hepatitis A vaccine. Adults who wish to be protected from this disease, have certain high-risk conditions, work with hepatitis A-infected animals, work in hepatitis A  research labs, or travel to or work in countries with a high rate of hepatitis A should be immunized. Adults who were previously unvaccinated and who anticipate close contact with an international adoptee during the first 60 days after arrival in the [place] from a country with a high rate of hepatitis A should be immunized.  Hepatitis B vaccine.  Adults who wish to be protected from this disease, have certain high-risk conditions, may be exposed to blood or other infectious body fluids, are household contacts or sex partners of hepatitis B positive people, are clients or workers in certain care facilities, or travel to or work in countries with a high rate of hepatitis B should be immunized.  Haemophilus influenzae type b (Hib) vaccine. A previously unvaccinated person with asplenia or sickle cell disease or having a scheduled splenectomy should receive 1 dose of Hib vaccine. Regardless of previous immunization, a recipient of a hematopoietic stem cell transplant should receive a 3-dose series 6 12 months after her successful transplant. Hib vaccine is not recommended for adults with HIV infection.  Preventive Services / Frequency Ages 83 to 39years  Blood pressure check.** / Every 1 to 2 years.  Lipid and cholesterol check.** / Every 5 years beginning at age 39.  Clinical breast exam.** / Every 3 years for women in their 11s and 66s.  BRCA-related cancer risk assessment.** / For women who have family members with a BRCA-related cancer (breast, ovarian, tubal, or peritoneal cancers).  Pap test.** / Every 2 years from ages 32 through 73. Every 3 years starting at age 59 through age 58 or 71 with a history of 3 consecutive normal Pap tests.  HPV screening.** / Every 3 years from ages 51 through ages 68 to 30 with a history of 3 consecutive normal Pap tests.  Hepatitis C blood test.** / For any individual with known risks for hepatitis C.  Skin self-exam. / Monthly.  Influenza vaccine. /  Every year.  Tetanus, diphtheria, and acellular pertussis (Tdap, Td) vaccine.** / Consult your health care provider. Pregnant women should receive 1 dose of Tdap vaccine during each pregnancy. 1 dose of Td every 10 years.  Varicella vaccine.** / Consult your health care provider. Pregnant females who do not have evidence of immunity should receive the first dose after pregnancy.  HPV vaccine. / 3 doses over 6 months, if 14 and younger. The vaccine is not recommended for use in pregnant females. However, pregnancy testing is not needed before receiving a dose.  Measles, mumps, rubella (MMR) vaccine.** / You need at least 1 dose of MMR if you were born in 1957 or later. You may also need a 2nd dose. For females of childbearing age, rubella immunity should be determined. If there is no evidence of immunity, females who are not pregnant should be vaccinated. If there is no evidence of immunity, females who are pregnant should delay immunization until after pregnancy.  Pneumococcal 13-valent conjugate (PCV13) vaccine.** / Consult your health care provider.  Pneumococcal polysaccharide (PPSV23) vaccine.** / 1 to 2 doses if you smoke  cigarettes or if you have certain conditions.  Meningococcal vaccine.** / 1 dose if you are age 68 to 68 years and a Market researcher living in a residence hall, or have one of several medical conditions, you need to get vaccinated against meningococcal disease. You may also need additional booster doses.  Hepatitis A vaccine.** / Consult your health care provider.  Hepatitis B vaccine.** / Consult your health care provider.  Haemophilus influenzae type b (Hib) vaccine.** / Consult your health care provider.  Ages 36 to 64years  Blood pressure check.** / Every 1 to 2 years.  Lipid and cholesterol check.** / Every 5 years beginning at age 41 years.  Lung cancer screening. / Every year if you are aged 89 80 years and have a 30-pack-year history of smoking  and currently smoke or have quit within the past 15 years. Yearly screening is stopped once you have quit smoking for at least 15 years or develop a health problem that would prevent you from having lung cancer treatment.  Clinical breast exam.** / Every year after age 54 years.  BRCA-related cancer risk assessment.** / For women who have family members with a BRCA-related cancer (breast, ovarian, tubal, or peritoneal cancers).  Mammogram.** / Every year beginning at age 10 years and continuing for as long as you are in good health. Consult with your health care provider.  Pap test.** / Every 3 years starting at age 35 years through age 37 or 34 years with a history of 3 consecutive normal Pap tests.  HPV screening.** / Every 3 years from ages 34 years through ages 37 to 60 years with a history of 3 consecutive normal Pap tests.  Fecal occult blood test (FOBT) of stool. / Every year beginning at age 23 years and continuing until age 57 years. You may not need to do this test if you get a colonoscopy every 10 years.  Flexible sigmoidoscopy or colonoscopy.** / Every 5 years for a flexible sigmoidoscopy or every 10 years for a colonoscopy beginning at age 2 years and continuing until age 40 years.  Hepatitis C blood test.** / For all people born from [birth year] through 1966 and any individual with known risks for hepatitis C.  Skin self-exam. / Monthly.  Influenza vaccine. / Every year.  Tetanus, diphtheria, and acellular pertussis (Tdap/Td) vaccine.** / Consult your health care provider. Pregnant women should receive 1 dose of Tdap vaccine during each pregnancy. 1 dose of Td every 10 years.  Varicella vaccine.** / Consult your health care provider. Pregnant females who do not have evidence of immunity should receive the first dose after pregnancy.  Zoster vaccine.** / 1 dose for adults aged 71 years or older.  Measles, mumps, rubella (MMR) vaccine.** / You need at least 1 dose of MMR if you  were born in 1957 or later. You may also need a 2nd dose. For females of childbearing age, rubella immunity should be determined. If there is no evidence of immunity, females who are not pregnant should be vaccinated. If there is no evidence of immunity, females who are pregnant should delay immunization until after pregnancy.  Pneumococcal 13-valent conjugate (PCV13) vaccine.** / Consult your health care provider.  Pneumococcal polysaccharide (PPSV23) vaccine.** / 1 to 2 doses if you smoke cigarettes or if you have certain conditions.  Meningococcal vaccine.** / Consult your health care provider.  Hepatitis A vaccine.** / Consult your health care provider.  Hepatitis B vaccine.** / Consult your health care provider.  Haemophilus influenzae  type b (Hib) vaccine.** / Consult your health care provider.  Ages 2 years and over  Blood pressure check.** / Every 1 to 2 years.  Lipid and cholesterol check.** / Every 5 years beginning at age 25 years.  Lung cancer screening. / Every year if you are aged 58 80 years and have a 30-pack-year history of smoking and currently smoke or have quit within the past 15 years. Yearly screening is stopped once you have quit smoking for at least 15 years or develop a health problem that would prevent you from having lung cancer treatment.  Clinical breast exam.** / Every year after age 57 years.  BRCA-related cancer risk assessment.** / For women who have family members with a BRCA-related cancer (breast, ovarian, tubal, or peritoneal cancers).  Mammogram.** / Every year beginning at age 42 years and continuing for as long as you are in good health. Consult with your health care provider.  Pap test.** / Every 3 years starting at age 54 years through age 36 or 4 years with 3 consecutive normal Pap tests. Testing can be stopped between 65 and 70 years with 3 consecutive normal Pap tests and no abnormal Pap or HPV tests in the past 10 years.  HPV screening.**  / Every 3 years from ages 9 years through ages 33 or 34 years with a history of 3 consecutive normal Pap tests. Testing can be stopped between 65 and 70 years with 3 consecutive normal Pap tests and no abnormal Pap or HPV tests in the past 10 years.  Fecal occult blood test (FOBT) of stool. / Every year beginning at age 35 years and continuing until age 62 years. You may not need to do this test if you get a colonoscopy every 10 years.  Flexible sigmoidoscopy or colonoscopy.** / Every 5 years for a flexible sigmoidoscopy or every 10 years for a colonoscopy beginning at age 76 years and continuing until age 69 years.  Hepatitis C blood test.** / For all people born from [birth year] through 1965 and any individual with known risks for hepatitis C.  Osteoporosis screening.** / A one-time screening for women ages 23 years and over and women at risk for fractures or osteoporosis.  Skin self-exam. / Monthly.  Influenza vaccine. / Every year.  Tetanus, diphtheria, and acellular pertussis (Tdap/Td) vaccine.** / 1 dose of Td every 10 years.  Varicella vaccine.** / Consult your health care provider.  Zoster vaccine.** / 1 dose for adults aged 93 years or older.  Pneumococcal 13-valent conjugate (PCV13) vaccine.** / Consult your health care provider.  Pneumococcal polysaccharide (PPSV23) vaccine.** / 1 dose for all adults aged 11 years and older.  Meningococcal vaccine.** / Consult your health care provider.  Hepatitis A vaccine.** / Consult your health care provider.  Hepatitis B vaccine.** / Consult your health care provider.  Haemophilus influenzae type b (Hib) vaccine.** / Consult your health care provider. ** Family history and personal history of risk and conditions may change your health care provider's recommendations. Document Released: 10/28/2001 Document Revised: 06/22/2013  [facility] Patient Information 2014 [place], [place].   EXERCISE AND DIET:  We recommended that you start or  continue a regular exercise program for good health. Regular exercise means any activity that makes your heart beat faster and makes you sweat.  We recommend exercising at least 30 minutes per day at least 3 days a week, preferably 5.  We also recommend a diet low in fat and sugar / carbohydrates.  Inactivity, poor dietary choices  and obesity can cause diabetes, heart attack, stroke, and kidney damage, among others.     ALCOHOL AND SMOKING:  Women should limit their alcohol intake to no more than 7 drinks/beers/glasses of wine (combined, not each!) per week. Moderation of alcohol intake to this level decreases your risk of breast cancer and liver damage.  ( And of course, no recreational drugs are part of a healthy lifestyle.)  Also, you should not be smoking at all or even being exposed to second hand smoke. Most people know smoking can cause cancer, and various heart and lung diseases, but did you know it also contributes to weakening of your bones?  Aging of your skin?  Yellowing of your teeth and nails?   CALCIUM AND VITAMIN D:  Adequate intake of calcium and Vitamin D are recommended.  The recommendations for exact amounts of these supplements seem to change often, but generally speaking 600 mg of calcium (either carbonate or citrate) and 800 units of Vitamin D per day seems prudent. Certain women may benefit from higher intake of Vitamin D.  If you are among these women, your doctor will have told you during your visit.     PAP SMEARS:  Pap smears, to check for cervical cancer or precancers,  have traditionally been done yearly, although recent scientific advances have shown that most women can have pap smears less often.  However, every woman still should have a physical exam from her gynecologist or primary care physician every year. It will include a breast check, inspection of the vulva and vagina to check for abnormal growths or skin changes, a visual exam of the cervix, and then an exam to  evaluate the size and shape of the uterus and ovaries.  And after 78 years of age, a rectal exam is indicated to check for rectal cancers. We will also provide age appropriate advice regarding health maintenance, like when you should have certain vaccines, screening for sexually transmitted diseases, bone density testing, colonoscopy, mammograms, etc.    MAMMOGRAMS:  All women over 32 years old should have a yearly mammogram. Many facilities now offer a "3D" mammogram, which may cost around $50 extra out of pocket. If possible,  we recommend you accept the option to have the 3D mammogram performed.  It both reduces the number of women who will be called back for extra views which then turn out to be normal, and it is better than the routine mammogram at detecting truly abnormal areas.     COLONOSCOPY:  Colonoscopy to screen for colon cancer is recommended for all women at age 44.  We know, you hate the idea of the prep.  We agree, BUT, having colon cancer and not knowing it is worse!!  Colon cancer so often starts as a polyp that can be seen and removed at colonscopy, which can quite literally save your life!  And if your first colonoscopy is normal and you have no family history of colon cancer, most women don't have to have it again for 10 years.  Once every ten years, you can do something that may end up saving your life, right?  We will be happy to help you get it scheduled when you are ready.  Be sure to check your insurance coverage so you understand how much it will cost.  It may be covered as a preventative service at no cost, but you should check your particular policy.

## 2017-12-22 NOTE — Progress Notes (Signed)
Subjective:   Kelli [name] is a 77 y.o. female who presents for Medicare Annual (Subsequent) preventive examination.  Activities of Daily Living In your present state of health, do you have difficulty performing the following activities?  1- Driving - no 2- Managing money - no 3- Feeding yourself - no 4- Getting from the bed to the chair - no 5- Climbing a flight of stairs - no 6- Preparing food and eating - no 7- Bathing or showering - no 8- Getting dressed - no 9- Getting to the toilet - no 10- Using the toilet - no 11- Moving around from place to place - no  Patient states that she does feel safe in her home.   Fall Risk  12/22/2017 11/02/2017  Falls in the past year? No No   Current Exercise Habits: Home exercise routine, Type of exercise: walking    Depression screen [facility] 2/9 12/22/2017 11/19/2017 11/02/2017 11/02/2017  Decreased Interest 0 0 0 0  Down, Depressed, Hopeless 0 0 0 0  PHQ - 2 Score 0 0 0 0  Altered sleeping 0 0 0 -  Tired, decreased energy 0 1 0 -  Change in appetite 0 1 0 -  Feeling bad or failure about yourself  0 0 0 -  Trouble concentrating 0 0 0 -  Moving slowly or fidgety/restless 0 0 0 -  Suicidal thoughts 0 0 0 -  PHQ-9 Score 0 2 0 -  Difficult doing work/chores Not difficult at all Not difficult at all Not difficult at all -   6CIT Screen 12/22/2017  What Year? 0 points  What month? 0 points  What time? 0 points  Count back from 20 0 points  Months in reverse 0 points  Repeat phrase 0 points  Total Score 0     Review of Systems:  General:   Denies fever, chills, unexplained weight loss.  Optho/Auditory:   Denies visual changes, blurred vision/LOV Respiratory:   Denies SOB, DOE more than baseline levels.  Cardiovascular:   Denies chest pain, palpitations, new onset peripheral edema  Gastrointestinal:   Denies nausea, vomiting, diarrhea.  Genitourinary: Denies dysuria, freq/ urgency, flank pain or discharge from genitals.  Endocrine:      Denies hot or cold intolerance, polyuria, polydipsia. Musculoskeletal:   Denies unexplained myalgias, joint swelling, unexplained arthralgias, gait problems.  Skin:  Denies rash, suspicious lesions Neurological:     Denies dizziness, unexplained weakness, numbness  Psychiatric/Behavioral:   Denies mood changes, suicidal or homicidal ideations, hallucinations          Objective:     Vitals: BP 134/80   Pulse 60   Ht 5\' 3"  (1.6 m)   Wt 165 lb 9.6 oz (75.1 kg)   SpO2 97%   BMI 29.33 kg/m   Body mass index is 29.33 kg/m.  Advanced Directives 12/24/2013 03/09/2013  Does Patient Have a Medical Advance Directive? Patient has advance directive, copy not in chart Patient has advance directive, copy not in chart  Type of Advance Directive Living will Living will;Healthcare Power of Attorney  Does patient want to make changes to medical advance directive? No change requested -  Copy of [place] in Chart? Copy requested from family Copy requested from family  Pre-existing out of facility DNR order (yellow form or pink MOST form) No -    Tobacco Social History   Tobacco Use  Smoking Status Never Smoker  Smokeless Tobacco Never Used     Counseling given:  Not Answered   Clinical Intake:                       Past Medical History:  Diagnosis Date  . Atrial fibrillation ([place]) 02/2013   paroxsymal; c/b Type 2 NSTEMI; CHADS2-VASc=2 (age, sex - patient had come off anti-HTN meds in past); Eliquis started;  Echocardiogram 03/10/13: EF 53-61%, grade 1 diastolic dysfunction, mild MR, mild LAE  . Hyperlipidemia   . Hypertension   . NSTEMI (non-ST elevated myocardial infarction) ([place]) 02/2013   demand ischemia in setting of AFib with RVR; ETT-Myoview 03/11/13: No ischemia or scar, EF 70%.   Past Surgical History:  Procedure Laterality Date  . APPENDECTOMY    . CATARACT EXTRACTION, BILATERAL    . EYE SURGERY    . TONSILLECTOMY     Family History  Problem  Relation Age of Onset  . Heart attack Mother 3  . Cancer Maternal Aunt        throat   Social History   Socioeconomic History  . Marital status: Widowed    Spouse name: Not on file  . Number of children: 3  . Years of education: Not on file  . Highest education level: Not on file  Occupational History  . Occupation: Surveyor, quantity- Day care    Employer: [employer]  . Financial resource strain: Not on file  . Food insecurity:    Worry: Not on file    Inability: Not on file  . Transportation needs:    Medical: Not on file    Non-medical: Not on file  Tobacco Use  . Smoking status: Never Smoker  . Smokeless tobacco: Never Used  Substance and Sexual Activity  . Alcohol use: Yes    Comment: 1-2 glasses of wine occ.  . Drug use: No  . Sexual activity: Not Currently  Lifestyle  . Physical activity:    Days per week: Not on file    Minutes per session: Not on file  . Stress: Not on file  Relationships  . Social connections:    Talks on phone: Not on file    Gets together: Not on file    Attends religious service: Not on file    Active member of club or organization: Not on file    Attends meetings of clubs or organizations: Not on file    Relationship status: Not on file  Other Topics Concern  . Not on file  Social History Narrative  . Not on file    Outpatient Encounter Medications as of 12/22/2017  Medication Sig  . acetaminophen (TYLENOL) 500 MG tablet Take 500 mg by mouth daily as needed for pain.  [name] atorvastatin (LIPITOR) 10 MG tablet Take 5 mg by mouth daily.   . Calcium Carb-Cholecalciferol (CALCIUM + D3) 600-200 MG-UNIT TABS Take 1 tablet by mouth daily.  . cefUROXime (CEFTIN) 500 MG tablet Take 1 tablet by mouth 2 (two) times daily.  . Cholecalciferol (VITAMIN D) 400 UNITS capsule Take 400 Units by mouth daily.  [name] Calcium (STOOL SOFTENER PO) Take by mouth at bedtime.  [name] ELIQUIS 5 MG TABS tablet TAKE 1 TABLET BY MOUTH  2 TIMES DAILY.  [name] FIBER ADULT GUMMIES PO Take 1 Dose by mouth daily.  . fish oil-omega-3 fatty acids 1000 MG capsule Take 1 g by mouth daily.  . fluticasone (FLONASE) 50 MCG/ACT nasal spray Place 1 spray into both nostrils daily.  [name] losartan (COZAAR) 25 MG  tablet Take 1 tablet (25 mg total) by mouth daily.  . metoprolol tartrate (LOPRESSOR) 50 MG tablet TAKE 1 TABLET BY MOUTH 2 TIMES DAILY.  . Multiple Vitamin (MULTIVITAMIN) capsule Take 1 capsule by mouth daily.  . naproxen sodium (ALEVE) 220 MG tablet Take 220 mg by mouth daily as needed.  . Pantoprazole Sodium (PROTONIX PO) Take by mouth as needed.  . Probiotic Product (PROBIOTIC DAILY PO) Take 1 tablet by mouth daily.  . vitamin C (ASCORBIC ACID) 500 MG tablet Take 500 mg by mouth daily.  . [DISCONTINUED] mometasone (NASONEX) 50 MCG/ACT nasal spray Place 2 sprays into the nose daily as needed (for allergies).  . [DISCONTINUED] predniSONE (DELTASONE) 10 MG tablet 2 tabs BID for 3 d, then 1 tab BID for 3 d, then 1/2 tab for 4 d   No facility-administered encounter medications on file as of 12/22/2017.     Activities of Daily Living In your present state of health, do you have any difficulty performing the following activities: 12/22/2017  Hearing? N  Vision? N  Difficulty concentrating or making decisions? N  Walking or climbing stairs? N  Dressing or bathing? N  Doing errands, shopping? N  Some recent data might be hidden    Patient Care Team: Mellody Dance, DO as PCP - General (Family Medicine) Martinique, Peter M, MD as Consulting Physician (Cardiology) Allyn Kenner, MD as Consulting Physician (Dermatology) Wonda Horner, MD as Consulting Physician (Gastroenterology)    Assessment:   This is a routine wellness examination for Kelli [name].  Exercise Activities and Dietary recommendations Current Exercise Habits: Home exercise routine, Type of exercise: walking  Goals    None      Fall Risk Fall Risk  12/22/2017 11/02/2017  Falls in  the past year? No No   Is the patient's home free of loose throw rugs in walkways, pet beds, electrical cords, etc?   yes      Grab bars in the bathroom? no      Handrails on the stairs?   Patient states that she has no stairs in the home.       Adequate lighting?   yes  Timed Get Up and Go performed: passed  Depression Screen PHQ 2/9 Scores 12/22/2017 11/19/2017 11/02/2017 11/02/2017  PHQ - 2 Score 0 0 0 0  PHQ- 9 Score 0 2 0 -     Cognitive Function     6CIT Screen 12/22/2017  What Year? 0 points  What month? 0 points  What time? 0 points  Count back from 20 0 points  Months in reverse 0 points  Repeat phrase 0 points  Total Score 0    Immunization History  Administered Date(s) Administered  . Pneumococcal Polysaccharide-23 06/12/2009  . Tdap 07/16/2011    Qualifies for Shingles Vaccine? Yes- info given  Screening Tests Health Maintenance  Topic Date Due  . INFLUENZA VACCINE  07/05/2018 (Originally 04/15/2018)  . DEXA SCAN  11/02/2018 (Originally 06/22/2005)  . PNA vac Low Risk Adult (2 of 2 - PCV13) 11/02/2018 (Originally 06/12/2010)  . TETANUS/TDAP  07/15/2021    Cancer Screenings: Lung: Low Dose CT Chest recommended if Age 82-80 years, 30 pack-year currently smoking OR have quit w/in 15years. Patient does not qualify. Breast:  Up to date on Mammogram? No  02/26/2016 Up to date of Bone Density/Dexa? 12/19/2016 Colorectal: 03/24/2014  Additional Screenings:  n/a: Hepatitis C Screening:      Plan:      I have personally reviewed and noted  the following in the patient's chart:   . Medical and social history . Use of alcohol, tobacco or illicit drugs  . Current medications and supplements . Functional ability and status . Nutritional status . Physical activity . Advanced directives . List of other physicians . Hospitalizations, surgeries, and ER visits in previous 12 months . Vitals . Screenings to include cognitive, depression, and falls . Referrals and  appointments  In addition, I have reviewed and discussed with patient certain preventive protocols, quality metrics, and best practice recommendations. A written personalized care plan for preventive services as well as general preventive health recommendations were provided to patient.     Mellody Dance, DO  12/22/2017

## 2017-12-28 ENCOUNTER — Ambulatory Visit: Payer: Medicare Other | Admitting: Family Medicine

## 2017-12-28 ENCOUNTER — Telehealth: Payer: Self-pay | Admitting: Family Medicine

## 2017-12-28 NOTE — Telephone Encounter (Signed)
Patient called to request Provider call in [facility] to [facility] since sinus allergies continued to plague her.  -glh

## 2017-12-28 NOTE — Telephone Encounter (Signed)
Patient was last seen on 12/22/2017.  Patient is having persistent issues with allergies.  Please advise. MPulliam, CMA/RT(R)

## 2017-12-29 MED ORDER — MONTELUKAST SODIUM 10 MG PO TABS: 10.0000 mg | ORAL_TABLET | Freq: Every day | ORAL | 1 refills | Status: DC

## 2017-12-29 NOTE — Telephone Encounter (Signed)
Yes please call in Singulair or electronically send in Singulair 10 mg 1 p.o. nightly dispense 90, 1 refill.

## 2017-12-29 NOTE — Telephone Encounter (Signed)
RX sent to pharmacy.  LVM informing pt of RX.  Charyl Bigger, CMA

## 2018-01-07 DIAGNOSIS — H5201 Hypermetropia, right eye: Secondary | ICD-10-CM | POA: Diagnosis not present

## 2018-01-11 DIAGNOSIS — L820 Inflamed seborrheic keratosis: Secondary | ICD-10-CM | POA: Diagnosis not present

## 2018-01-11 DIAGNOSIS — Z1283 Encounter for screening for malignant neoplasm of skin: Secondary | ICD-10-CM | POA: Diagnosis not present

## 2018-01-11 DIAGNOSIS — L400 Psoriasis vulgaris: Secondary | ICD-10-CM | POA: Diagnosis not present

## 2018-01-12 DIAGNOSIS — M9904 Segmental and somatic dysfunction of sacral region: Secondary | ICD-10-CM | POA: Diagnosis not present

## 2018-01-12 DIAGNOSIS — M9905 Segmental and somatic dysfunction of pelvic region: Secondary | ICD-10-CM | POA: Diagnosis not present

## 2018-01-12 DIAGNOSIS — M9903 Segmental and somatic dysfunction of lumbar region: Secondary | ICD-10-CM | POA: Diagnosis not present

## 2018-01-12 DIAGNOSIS — M5388 Other specified dorsopathies, sacral and sacrococcygeal region: Secondary | ICD-10-CM | POA: Diagnosis not present

## 2018-01-12 DIAGNOSIS — M4306 Spondylolysis, lumbar region: Secondary | ICD-10-CM | POA: Diagnosis not present

## 2018-01-26 DIAGNOSIS — M5388 Other specified dorsopathies, sacral and sacrococcygeal region: Secondary | ICD-10-CM | POA: Diagnosis not present

## 2018-01-26 DIAGNOSIS — M4306 Spondylolysis, lumbar region: Secondary | ICD-10-CM | POA: Diagnosis not present

## 2018-01-26 DIAGNOSIS — M9903 Segmental and somatic dysfunction of lumbar region: Secondary | ICD-10-CM | POA: Diagnosis not present

## 2018-01-26 DIAGNOSIS — M9904 Segmental and somatic dysfunction of sacral region: Secondary | ICD-10-CM | POA: Diagnosis not present

## 2018-01-26 DIAGNOSIS — M9905 Segmental and somatic dysfunction of pelvic region: Secondary | ICD-10-CM | POA: Diagnosis not present

## 2018-02-04 ENCOUNTER — Other Ambulatory Visit: Payer: Self-pay

## 2018-02-04 NOTE — Patient Outreach (Signed)
[facility]) Care Management  02/04/2018  Haeley C Feagans March 20, 1941 [id number]   Medication Adherence call to Mr. Sabryna Saputo patient did not answer, left a message for patient to call back, patient is due on Atorvastatin 10 mg [facility] said patient did not pick up for May she pick up all her other medications she did not pick up Atorvastatin. Mrs. Taji is showing under [facility] Ins.for Medication Adherence call.   [facility] Management Direct Dial [phone]  Fax [phone] [email or web address]

## 2018-02-10 ENCOUNTER — Other Ambulatory Visit: Payer: Self-pay | Admitting: Cardiology

## 2018-02-10 NOTE — Telephone Encounter (Signed)
Rx sent to pharmacy   

## 2018-02-16 ENCOUNTER — Other Ambulatory Visit: Payer: Medicare Other

## 2018-02-16 DIAGNOSIS — R7309 Other abnormal glucose: Secondary | ICD-10-CM | POA: Diagnosis not present

## 2018-02-16 DIAGNOSIS — I4891 Unspecified atrial fibrillation: Secondary | ICD-10-CM | POA: Diagnosis not present

## 2018-02-16 DIAGNOSIS — E875 Hyperkalemia: Secondary | ICD-10-CM | POA: Diagnosis not present

## 2018-02-16 DIAGNOSIS — I10 Essential (primary) hypertension: Secondary | ICD-10-CM

## 2018-02-17 LAB — BASIC METABOLIC PANEL
BUN/Creatinine Ratio: 22 (ref 12–28)
BUN: 22 mg/dL (ref 8–27)
CALCIUM: 9.2 mg/dL (ref 8.7–10.3)
CO2: 25 mmol/L (ref 20–29)
Chloride: 102 mmol/L (ref 96–106)
Creatinine, Ser: 0.99 mg/dL (ref 0.57–1.00)
GFR calc non Af Amer: 55 mL/min/{1.73_m2} — ABNORMAL LOW (ref >59)
GFR, EST AFRICAN AMERICAN: 64 mL/min/{1.73_m2} (ref >59)
Glucose: 93 mg/dL (ref 65–99)
POTASSIUM: 5.5 mmol/L — AB (ref 3.5–5.2)
Sodium: 138 mmol/L (ref 134–144)

## 2018-02-17 LAB — HEMOGLOBIN A1C
ESTIMATED AVERAGE GLUCOSE: 120 mg/dL
HEMOGLOBIN A1C: 5.8 % — AB (ref 4.8–5.6)

## 2018-02-18 ENCOUNTER — Ambulatory Visit: Payer: Medicare Other | Admitting: Family Medicine

## 2018-02-23 DIAGNOSIS — M9904 Segmental and somatic dysfunction of sacral region: Secondary | ICD-10-CM | POA: Diagnosis not present

## 2018-02-23 DIAGNOSIS — M4305 Spondylolysis, thoracolumbar region: Secondary | ICD-10-CM | POA: Diagnosis not present

## 2018-02-23 DIAGNOSIS — M9905 Segmental and somatic dysfunction of pelvic region: Secondary | ICD-10-CM | POA: Diagnosis not present

## 2018-02-23 DIAGNOSIS — M9902 Segmental and somatic dysfunction of thoracic region: Secondary | ICD-10-CM | POA: Diagnosis not present

## 2018-02-23 DIAGNOSIS — M5388 Other specified dorsopathies, sacral and sacrococcygeal region: Secondary | ICD-10-CM | POA: Diagnosis not present

## 2018-02-26 ENCOUNTER — Other Ambulatory Visit: Payer: Self-pay | Admitting: Cardiology

## 2018-03-09 ENCOUNTER — Ambulatory Visit: Payer: Medicare Other | Admitting: Family Medicine

## 2018-03-23 DIAGNOSIS — M9903 Segmental and somatic dysfunction of lumbar region: Secondary | ICD-10-CM | POA: Diagnosis not present

## 2018-03-23 DIAGNOSIS — M5431 Sciatica, right side: Secondary | ICD-10-CM | POA: Diagnosis not present

## 2018-03-23 DIAGNOSIS — M9904 Segmental and somatic dysfunction of sacral region: Secondary | ICD-10-CM | POA: Diagnosis not present

## 2018-03-23 DIAGNOSIS — M5388 Other specified dorsopathies, sacral and sacrococcygeal region: Secondary | ICD-10-CM | POA: Diagnosis not present

## 2018-03-23 DIAGNOSIS — M9905 Segmental and somatic dysfunction of pelvic region: Secondary | ICD-10-CM | POA: Diagnosis not present

## 2018-03-30 ENCOUNTER — Other Ambulatory Visit: Payer: Self-pay

## 2018-03-30 NOTE — Patient Outreach (Signed)
[facility]) Care Management  03/30/2018  Makailee C Holness 07-07-1941 [id number]   Medication Adherence call to Mrs. Rainelle Deliz left a message for patient to call back patient is due on Atorvastatin 10 mg.Mrs. Miesha Tobon is showing past due under [place].  [facility] Management Direct Dial [phone]  Fax [phone] [email or web address]

## 2018-04-07 ENCOUNTER — Encounter: Payer: Self-pay | Admitting: Family Medicine

## 2018-04-07 ENCOUNTER — Ambulatory Visit (INDEPENDENT_AMBULATORY_CARE_PROVIDER_SITE_OTHER): Payer: Medicare Other | Admitting: Family Medicine

## 2018-04-07 VITALS — BP 138/81 | HR 56 | Ht 63.0 in | Wt 170.7 lb

## 2018-04-07 DIAGNOSIS — M25561 Pain in right knee: Secondary | ICD-10-CM

## 2018-04-07 DIAGNOSIS — M179 Osteoarthritis of knee, unspecified: Secondary | ICD-10-CM

## 2018-04-07 DIAGNOSIS — J3089 Other allergic rhinitis: Secondary | ICD-10-CM | POA: Diagnosis not present

## 2018-04-07 DIAGNOSIS — I10 Essential (primary) hypertension: Secondary | ICD-10-CM

## 2018-04-07 DIAGNOSIS — I4891 Unspecified atrial fibrillation: Secondary | ICD-10-CM

## 2018-04-07 DIAGNOSIS — R7303 Prediabetes: Secondary | ICD-10-CM | POA: Diagnosis not present

## 2018-04-07 DIAGNOSIS — M7051 Other bursitis of knee, right knee: Secondary | ICD-10-CM

## 2018-04-07 DIAGNOSIS — E875 Hyperkalemia: Secondary | ICD-10-CM

## 2018-04-07 DIAGNOSIS — M1710 Unilateral primary osteoarthritis, unspecified knee: Secondary | ICD-10-CM | POA: Insufficient documentation

## 2018-04-07 MED ORDER — LOSARTAN POTASSIUM-HCTZ 50-12.5 MG PO TABS: ORAL_TABLET | ORAL | 0 refills | Status: DC

## 2018-04-07 NOTE — Progress Notes (Signed)
Assessment and plan:  1. Essential hypertension   2. Hyperkalemia-secondary to when other doctor started patient on losartan   3. Atrial fibrillation with RVR ([place])   4. Prediabetes   5. Environmental and seasonal allergies   6. Acute pain of right knee   7. Osteoarthritis of knee, unspecified laterality, unspecified osteoarthritis type   8. Bursitis of right knee, unspecified bursa     1. Hyperkalemia - Potassium up to 5.5 from 5.3 prior.  - Educated and encouraged patient to continue avoiding potasisum rich foods. - Kidney function improved from prior.  Continue to avoid nephrotoxic substances.  2. Blood Pressure/Cardiology - Blood pressure stable at this time.  However, potassium has increased. - Medication changed today to combination losartan-HCTZ.  See changes to med list below. - Counseled patient that losartan can help to protect kidney health, but may raise potassium. - Patient knows to monitor for low blood pressure after medication change.  - Ambulatory blood pressure monitoring encouraged.  Keep a regular log and bring to next OV. - Call if feeling dizzy or lightheaded, or other symptoms of low blood pressure. - Patient knows to return to clinic if experiencing alarming sx.  - Per pt, was started on "new medicine" by heart doctor last summer (Dr. Peter Martinique). - Medications prescribed by cardiology include losartan & metoprolol. - Per patient, takes one metoprolol AM and 1/2 at night.   - Cardiology manages several of her medications, including Eliquis.  - Per patient, is due to follow up with cardiology in October. - Continue to follow up with specialist as scheduled.  3. Prediabetes - HbA1c up to 5.8 today.  Counseled patient that 5.6 or less is normal range for HbA1c. - Encouraged patient to reduce intake of carbohydrates.  - Counseled patient on prevention of diabetes and discussed dietary  and lifestyle modifications as first line.  Importance of low carb/ketogenic diet discussed with patient in addition to regular exercise.   4. Breathing/Seasonal Allergies - Per patient, only takes Singulair seasonally. - May continue to take allergy medications seasonally as needed.  5. Right Knee Pain - Advised patient to elevate the knee while at home at night. - Ice the area of pain for 15-20 minutes, 3-4 times per day, or up to every hour if desired.  - Encouraged patient to walk on more forgiving surfaces, and only flat ground. - Avoid activities involving flexing, bending, squatting, kneeling, or other pressure on the knees.  - Take tylenol extra strength OTC as needed. - Strongly advised patient to avoid use of NSAID's to protect kidney health.  - If over the next two weeks, sx do not improve, patient knows to return for an acute appointment and further evaluation.  6. BMI Counseling Explained to patient what BMI refers to, and what it means medically.    Told patient to think about it as a "medical risk stratification measurement" and how increasing BMI is associated with increasing risk/ or worsening state of various diseases such as hypertension, hyperlipidemia, diabetes, premature OA, depression etc.  American Heart Association guidelines for healthy diet, basically Mediterranean diet, and exercise guidelines of 30 minutes 5 days per week or more discussed in detail.  Health counseling performed.  All questions answered.  7. Lifestyle & Preventative Health Maintenance - Advised patient to continue working toward exercising to improve overall mental, physical, and emotional health.    - Engage in daily physical activity, especially a designated exercise routine.  Recommended  that the patient eventually strive for at least 150 minutes of moderate cardiovascular activity per week according to guidelines established by the [facility].   - Healthy dietary habits encouraged, including  low-carb, and high amounts of lean protein in diet.   - Patient should also consume adequate amounts of water - half of body weight in oz of water per day.   Education and routine counseling performed. Handouts provided.  8. Follow-Up - Return in 4 months to evaluate A1c, potassium, and progress on medication changes (losartan-HCTZ). - Return to follow-up OV with BP log.  - Otherwise return as scheduled for regular chronic follow-up.  - If needed, patient may return for acute care regarding her right knee, or other concerning symptoms.   Meds ordered this encounter  Medications  . losartan-hydrochlorothiazide (HYZAAR) 50-12.5 MG tablet    Sig: Start with one half tablet daily for 1 week, then 1 full tablet daily as tolerated    Dispense:  90 tablet    Refill:  0     Return in about 4 months (around 08/08/2018) for BP since change in med; pre-DM, wt etc.   Anticipatory guidance and routine counseling done re: condition, txmnt options and need for follow up. All questions of patient's were answered.   Gross side effects, risk and benefits, and alternatives of medications discussed with patient.  Patient is aware that all medications have potential side effects and we are unable to predict every sideeffect or drug-drug interaction that may occur.  Expresses verbal understanding and consents to current therapy plan and treatment regiment.  Please see AVS handed out to patient at the end of our visit for additional patient instructions/ counseling done pertaining to today's office visit.  Note: This document was prepared using Dragon voice recognition software and may include unintentional dictation errors.  This document serves as a record of services personally performed by Mellody Dance, DO. It was created on her behalf by [name], a trained medical scribe. The creation of this record is based on the scribe's personal observations and the provider's statements to them.    I have reviewed the above medical documentation for accuracy and completeness and I concur.  Mellody Dance 04/07/18 3:41 PM   ----------------------------------------------------------------------------------------------------------------------  Subjective:   CC:   Kelli [name] is a 77 y.o. female who presents to [place] [facility] today for regular chronic follow-up, along with review and discussion of recent bloodwork that was done.  1. All recent blood work that we ordered was reviewed with patient today.  Patient was counseled on all abnormalities and we discussed dietary and lifestyle changes that could help those values (also medications when appropriate).  Extensive health counseling performed and all patient's concerns/ questions were addressed.   Patient's nephew recently passed.  Hyperkalemia Patient today is mostly curious about her potassium.  She wonders if her blood pressure medicine could be affecting her levels.  She states she was started on "new medication" last summer by her heart doctor.  Patient is not on any potassium supplements that she is aware of.  Notes that she was formerly eating a lot of potassium rich foods (bananas, avocados, tomatoes), but decreased her intake since becoming aware of this.  Patient is due to follow up with cardiology in October.  Maintained on Eliquis.  Denies issues bleeding.  Blood Pressure Notes that her blood pressure at home runs in the 130's, sometimes 140's.  Remarks that her blood pressure is always high  when she sees her cardiologist.  Patient takes one tablet metoprolol AM and 1/2 tablet at night.  She reduced her dose due to feeling symptoms of low blood pressure.  Denies chest tightness, chest pain, dizziness, headache.  Notes "I've always had a little bit of a headache, but nothing like migraines."  Cholesterol Continues Lipitor nightly before bed.  Breathing/Seasonal Allergies Patient notes  that she only takes medications as-needed when her allergies flare.  Patient states she used to exercise, but hasn't been exercising as regularly lately.  Right Knee Pain - Lasting About a Month Patient takes alleve occasionally for right knee pain.  Notes that tylenol does not alleviate her pain.  At first, patient isn't sure how this pain started.  Then she notes she went on a trip about a month ago, where she did a "lot more walking."  Notes she was hiking up hills, walking through creeks, and otherwise travelling by foot often on uneven ground.  Notes the pain had started prior to her mountain trip as well.  States that she notices increased pain while she lowers herself down into her nightly bath.  Worse with: putting weight on it, bending it.  Going down the stairs hurts.  Denies: feelings of instability.  To treat: uses NSAID's once every other day or every two days "because I don't like taking it."  She used to ice the area but hasn't been icing it as much lately.   Wt Readings from Last 3 Encounters:  04/07/18 170 lb 11.2 oz (77.4 kg)  12/22/17 165 lb 9.6 oz (75.1 kg)  11/19/17 171 lb (77.6 kg)   BP Readings from Last 3 Encounters:  04/07/18 138/81  12/22/17 134/80  11/19/17 135/78   Pulse Readings from Last 3 Encounters:  04/07/18 (!) 56  12/22/17 60  11/19/17 (!) 58   BMI Readings from Last 3 Encounters:  04/07/18 30.24 kg/m  12/22/17 29.33 kg/m  11/19/17 30.29 kg/m     Patient Care Team    Relationship Specialty Notifications Start End  Mellody Dance, DO PCP - General Family Medicine  11/02/17   Martinique, Peter M, MD Consulting Physician Cardiology  11/02/17   Allyn Kenner, MD Consulting Physician Dermatology  11/02/17    Comment: see's them yrly- Dr Theda Sers is who she actually sees  Wonda Horner, MD Consulting Physician Gastroenterology  11/02/17     Full medical history updated and reviewed in the office today  Patient Active Problem List   Diagnosis  Date Noted  . NSTEMI (non-ST elevated myocardial infarction) ([place]) 03/10/2013    Priority: High  . Hyperkalemia 04/07/2018  . Prediabetes 04/07/2018  . Serum potassium elevated 04/07/2018  . Acute pain of right knee 04/07/2018  . Bursitis of right knee 04/07/2018  . Osteoarthritis of knee 04/07/2018  . Postmenopausal bone loss 11/19/2017  . Hyperlipidemia 11/02/2017  . Gastroesophageal reflux disease 11/02/2017  . Allergic rhinitis due to pollen 11/02/2017  . Environmental and seasonal allergies 11/02/2017  . Vitamin D deficiency 11/02/2017  . HTN (hypertension) 03/10/2014  . Perforated diverticulitis 12/24/2013  . Chronic constipation 12/24/2013  . Fatigue 08/18/2013  . Atrial fibrillation with RVR ([place]) 03/09/2013  . Elevated troponin 03/09/2013    Past Medical History:  Diagnosis Date  . Atrial fibrillation ([place]) 02/2013   paroxsymal; c/b Type 2 NSTEMI; CHADS2-VASc=2 (age, sex - patient had come off anti-HTN meds in past); Eliquis started;  Echocardiogram 03/10/13: EF 40-98%, grade 1 diastolic dysfunction, mild MR, mild LAE  . Hyperlipidemia   .  Hypertension   . NSTEMI (non-ST elevated myocardial infarction) ([place]) 02/2013   demand ischemia in setting of AFib with RVR; ETT-Myoview 03/11/13: No ischemia or scar, EF 70%.    Past Surgical History:  Procedure Laterality Date  . APPENDECTOMY    . CATARACT EXTRACTION, BILATERAL    . EYE SURGERY    . TONSILLECTOMY      Social History   Tobacco Use  . Smoking status: Never Smoker  . Smokeless tobacco: Never Used  Substance Use Topics  . Alcohol use: Yes    Comment: 1-2 glasses of wine occ.    Family Hx: Family History  Problem Relation Age of Onset  . Heart attack Mother 4  . Cancer Maternal Aunt        throat     Medications: Current Outpatient Medications  Medication Sig Dispense Refill  . acetaminophen (TYLENOL) 500 MG tablet Take 500 mg by mouth daily as needed for pain.    [name] atorvastatin (LIPITOR) 10 MG  tablet Take 5 mg by mouth daily.     . Calcium Carb-Cholecalciferol (CALCIUM + D3) 600-200 MG-UNIT TABS Take 1 tablet by mouth daily.    . Cholecalciferol (VITAMIN D) 400 UNITS capsule Take 400 Units by mouth daily.    [name] Calcium (STOOL SOFTENER PO) Take by mouth at bedtime.    [name] ELIQUIS 5 MG TABS tablet TAKE 1 TABLET BY MOUTH 2 TIMES DAILY. 180 tablet 1  . FIBER ADULT GUMMIES PO Take 1 Dose by mouth daily.    . fish oil-omega-3 fatty acids 1000 MG capsule Take 1 g by mouth daily.    . fluticasone (FLONASE) 50 MCG/ACT nasal spray Place 1 spray into both nostrils daily.    . metoprolol tartrate (LOPRESSOR) 50 MG tablet TAKE 1 TABLET BY MOUTH 2 TIMES DAILY. 60 tablet 7  . Multiple Vitamin (MULTIVITAMIN) capsule Take 1 capsule by mouth daily.    . Pantoprazole Sodium (PROTONIX PO) Take by mouth as needed.    . Probiotic Product (PROBIOTIC DAILY PO) Take 1 tablet by mouth daily.    . vitamin C (ASCORBIC ACID) 500 MG tablet Take 500 mg by mouth daily.    [name] losartan-hydrochlorothiazide (HYZAAR) 50-12.5 MG tablet Start with one half tablet daily for 1 week, then 1 full tablet daily as tolerated 90 tablet 0  . montelukast (SINGULAIR) 10 MG tablet Take 1 tablet (10 mg total) by mouth at bedtime. 90 tablet 1   No current facility-administered medications for this visit.     Allergies:  Allergies  Allergen Reactions  . Augmentin [Amoxicillin-Pot Clavulanate] Itching and Rash     Review of Systems: General:   No F/C, wt loss Pulm:   No DIB, SOB, pleuritic chest pain Card:  No CP, palpitations Abd:  No n/v/d or pain Ext:  No inc edema from baseline  Objective:  Blood pressure 138/81, pulse (!) 56, height 5\' 3"  (1.6 m), weight 170 lb 11.2 oz (77.4 kg), SpO2 98 %. Body mass index is 30.24 kg/m. Gen:   Well NAD, A and O *3 HEENT:    [place]/AT, EOMI,  MMM Lungs:   Normal work of breathing. CTA B/L, no Wh, rhonchi Heart:   RRR, S1, S2 WNL's, no MRG Abd:   No gross distention Exts:     warm, pink,  Brisk capillary refill, warm and well perfused.  Psych:    No HI/SI, judgement and insight good, Euthymic mood. Full Affect. Right Knee: Normal to inspection with no erythema  or effusion or obvious bony abnormalities. Palpation essentially normal with slightly increased warmth of right knee vs left., mild joint line tenderness medial & lateral, no patellar tenderness, no condyle tenderness. ROM full in flexion and extension and lower leg rotation, yes, but with some discomfort on flexion and rotation. Ligaments with solid endpoint including ACL, PCL, LCL, MCL. Negative Mcmurray's, Appley's. Non painful patellar compression. Patellar glide without crepitus. Patellar and quadriceps tendons unremarkable.   Recent Results (from the past 2160 hour(s))  Hemoglobin A1c     Status: Abnormal   Collection Time: 02/16/18  8:35 AM  Result Value Ref Range   Hgb A1c MFr Bld 5.8 (H) 4.8 - 5.6 %    Comment:          Prediabetes: 5.7 - 6.4          Diabetes: >6.4          Glycemic control for adults with diabetes: <7.0    Est. average glucose Bld gHb Est-mCnc 120 mg/dL  Basic Metabolic Panel (BMET)     Status: Abnormal   Collection Time: 02/16/18  8:35 AM  Result Value Ref Range   Glucose 93 65 - 99 mg/dL   BUN 22 8 - 27 mg/dL   Creatinine, Ser 0.99 0.57 - 1.00 mg/dL   GFR calc non Af Amer 55 (L) >59 mL/min/1.73   GFR calc Af Amer 64 >59 mL/min/1.73   BUN/Creatinine Ratio 22 12 - 28   Sodium 138 134 - 144 mmol/L   Potassium 5.5 (H) 3.5 - 5.2 mmol/L   Chloride 102 96 - 106 mmol/L   CO2 25 20 - 29 mmol/L   Calcium 9.2 8.7 - 10.3 mg/dL

## 2018-04-07 NOTE — Patient Instructions (Signed)
We added a small diuretic dose to your losartan to help with the hyperkalemia or elevated potassium in your blood.  Please make sure you are monitoring your blood pressure more frequently since we are doing this medication change.  Please follow-up sooner than planned if you have any problems\new symptoms or blood pressure is too high or too low. -We will recheck your potassium in 4 months when you return as well as your A1c.   Risk factors for prediabetes and type 2 diabetes  Researchers don't fully understand why some people develop prediabetes and type 2 diabetes and others don't.  It's clear that certain factors increase the risk, however, including:  Weight. The more fatty tissue you have, the more resistant your cells become to insulin.  Inactivity. The less active you are, the greater your risk. Physical activity helps you control your weight, uses up glucose as energy and makes your cells more sensitive to insulin.  Family history. Your risk increases if a parent or sibling has type 2 diabetes.  Race. Although it's unclear why, people of certain races - including blacks, Hispanics, American Indians and Asian-Americans - are at higher risk.  Age. Your risk increases as you get older. This may be because you tend to exercise less, lose muscle mass and gain weight as you age. But type 2 diabetes is also increasing dramatically among children, adolescents and younger adults.  Gestational diabetes. If you developed gestational diabetes when you were pregnant, your risk of developing prediabetes and type 2 diabetes later increases. If you gave birth to a baby weighing more than 9 pounds (4 kilograms), you're also at risk of type 2 diabetes.  Polycystic ovary syndrome. For women, having polycystic ovary syndrome - a common condition characterized by irregular menstrual periods, excess hair growth and obesity - increases the risk of diabetes.  High blood pressure. Having blood pressure over 140/90  millimeters of mercury (mm Hg) is linked to an increased risk of type 2 diabetes.  Abnormal cholesterol and triglyceride levels. If you have low levels of high-density lipoprotein (HDL), or "good," cholesterol, your risk of type 2 diabetes is higher. Triglycerides are another type of fat carried in the blood. People with high levels of triglycerides have an increased risk of type 2 diabetes. Your doctor can let you know what your cholesterol and triglyceride levels are.  A good guide to good carbs: The glycemic index ---If you have diabetes, or at risk for diabetes, you know all too well that when you eat carbohydrates, your blood sugar goes up. The total amount of carbs you consume at a meal or in a snack mostly determines what your blood sugar will do. But the food itself also plays a role. A serving of white rice has almost the same effect as eating pure table sugar - a quick, high spike in blood sugar. A serving of lentils has a slower, smaller effect.  ---Picking good sources of carbs can help you control your blood sugar and your weight. Even if you don't have diabetes, eating healthier carbohydrate-rich foods can help ward off a host of chronic conditions, from heart disease to various cancers to, well, diabetes.  ---One way to choose foods is with the glycemic index (GI). This tool measures how much a food boosts blood sugar.  The glycemic index rates the effect of a specific amount of a food on blood sugar compared with the same amount of pure glucose. A food with a glycemic index of 28 boosts blood  sugar only 28% as much as pure glucose. One with a GI of 95 acts like pure glucose.    High glycemic foods result in a quick spike in insulin and blood sugar (also known as blood glucose).  Low glycemic foods have a slower, smaller effect- these are healthier for you.   Using the glycemic index Using the glycemic index is easy: choose foods in the low GI category instead of those in the high GI  category (see below), and go easy on those in between. Low glycemic index (GI of 55 or less): Most fruits and vegetables, beans, minimally processed grains, pasta, low-fat dairy foods, and nuts.  Moderate glycemic index (GI 56 to 69): White and sweet potatoes, corn, white rice, couscous, breakfast cereals such as Cream of Wheat and Mini Wheats.  High glycemic index (GI of 70 or higher): White bread, rice cakes, most crackers, bagels, cakes, doughnuts, croissants, most packaged breakfast cereals. You can see the values for 100 commons foods and get links to more at www.health.CheapToothpicks.si.  Swaps for lowering glycemic index  Instead of this high-glycemic index food Eat this lower-glycemic index food  White rice Brown rice or converted rice  Instant oatmeal Steel-cut oats  Cornflakes Bran flakes  Baked potato Pasta, bulgur  White bread Whole-grain bread  Corn Peas or leafy greens       Prediabetes Eating Plan  Prediabetes--also called impaired glucose tolerance or impaired fasting glucose--is a condition that causes blood sugar (blood glucose) levels to be higher than normal. Following a healthy diet can help to keep prediabetes under control. It can also help to lower the risk of type 2 diabetes and heart disease, which are increased in people who have prediabetes. Along with regular exercise, a healthy diet:  Promotes weight loss.  Helps to control blood sugar levels.  Helps to improve the way that the body uses insulin.   WHAT DO I NEED TO KNOW ABOUT THIS EATING PLAN?   Use the glycemic index (GI) to plan your meals. The index tells you how quickly a food will raise your blood sugar. Choose low-GI foods. These foods take a longer time to raise blood sugar.  Pay close attention to the amount of carbohydrates in the food that you eat. Carbohydrates increase blood sugar levels.  Keep track of how many calories you take in. Eating the right amount of calories will help you  to achieve a healthy weight. Losing about 7 percent of your starting weight can help to prevent type 2 diabetes.  You may want to follow a Mediterranean diet. This diet includes a lot of vegetables, lean meats or fish, whole grains, fruits, and healthy oils and fats.   WHAT FOODS CAN I EAT?  Grains Whole grains, such as whole-wheat or whole-grain breads, crackers, cereals, and pasta. Unsweetened oatmeal. Bulgur. Barley. Quinoa. Brown rice. Corn or whole-wheat flour tortillas or taco shells. Vegetables Lettuce. Spinach. Peas. Beets. Cauliflower. Cabbage. Broccoli. Carrots. Tomatoes. Squash. Eggplant. Herbs. Peppers. Onions. Cucumbers. Brussels sprouts. Fruits Berries. Bananas. Apples. Oranges. Grapes. Papaya. Mango. Pomegranate. Kiwi. Grapefruit. Cherries. Meats and Other Protein Sources Seafood. Lean meats, such as chicken and [place] or lean cuts of pork and beef. Tofu. Eggs. Nuts. Beans. Dairy Low-fat or fat-free dairy products, such as yogurt, cottage cheese, and cheese. Beverages Water. Tea. Coffee. Sugar-free or diet soda. Seltzer water. Milk. Milk alternatives, such as soy or almond milk. Condiments Mustard. Relish. Low-fat, low-sugar ketchup. Low-fat, low-sugar barbecue sauce. Low-fat or fat-free mayonnaise. Sweets and Desserts  Sugar-free or low-fat pudding. Sugar-free or low-fat ice cream and other frozen treats. Fats and Oils Avocado. Walnuts. Olive oil. The items listed above may not be a complete list of recommended foods or beverages. Contact your dietitian for more options.    WHAT FOODS ARE NOT RECOMMENDED?  Grains Refined white flour and flour products, such as bread, pasta, snack foods, and cereals. Beverages Sweetened drinks, such as sweet iced tea and soda. Sweets and Desserts Baked goods, such as cake, cupcakes, pastries, cookies, and cheesecake. The items listed above may not be a complete list of foods and beverages to avoid. Contact your dietitian for more  information.   This information is not intended to replace advice given to you by your health care provider. Make sure you discuss any questions you have with your health care provider.   Document Released: 01/16/2015 Document Reviewed: 01/16/2015 Elsevier Interactive Patient Education [employer].

## 2018-04-13 ENCOUNTER — Other Ambulatory Visit: Payer: Self-pay | Admitting: Cardiology

## 2018-04-14 NOTE — Telephone Encounter (Signed)
Rx sent to pharmacy   

## 2018-04-22 DIAGNOSIS — M9903 Segmental and somatic dysfunction of lumbar region: Secondary | ICD-10-CM | POA: Diagnosis not present

## 2018-04-22 DIAGNOSIS — M9905 Segmental and somatic dysfunction of pelvic region: Secondary | ICD-10-CM | POA: Diagnosis not present

## 2018-04-22 DIAGNOSIS — M5431 Sciatica, right side: Secondary | ICD-10-CM | POA: Diagnosis not present

## 2018-04-22 DIAGNOSIS — M9904 Segmental and somatic dysfunction of sacral region: Secondary | ICD-10-CM | POA: Diagnosis not present

## 2018-04-22 DIAGNOSIS — M5388 Other specified dorsopathies, sacral and sacrococcygeal region: Secondary | ICD-10-CM | POA: Diagnosis not present

## 2018-05-12 ENCOUNTER — Ambulatory Visit (INDEPENDENT_AMBULATORY_CARE_PROVIDER_SITE_OTHER): Payer: Medicare Other | Admitting: Family Medicine

## 2018-05-12 ENCOUNTER — Encounter: Payer: Self-pay | Admitting: Family Medicine

## 2018-05-12 VITALS — BP 126/74 | HR 68 | Ht 63.0 in | Wt 169.0 lb

## 2018-05-12 DIAGNOSIS — M25561 Pain in right knee: Secondary | ICD-10-CM | POA: Diagnosis not present

## 2018-05-12 DIAGNOSIS — E875 Hyperkalemia: Secondary | ICD-10-CM | POA: Diagnosis not present

## 2018-05-12 DIAGNOSIS — I10 Essential (primary) hypertension: Secondary | ICD-10-CM | POA: Diagnosis not present

## 2018-05-12 NOTE — Patient Instructions (Addendum)
Please continue to ice your knee 15 minutes 4-5 times per day.  Please ice the front and back of it.  Please continue to monitor your blood pressure at home and keep a log.  As long as it continues and to be under good control please continue with the same half a tablet you are currently on.    Meniscus Tear A meniscus tear is a knee injury in which a piece of the meniscus is torn. The meniscus is a thick, rubbery, wedge-shaped cartilage in the knee. Two menisci are located in each knee. They sit between the upper bone (femur) and lower bone (tibia) that make up the knee joint. Each meniscus acts as a shock absorber for the knee. A torn meniscus is one of the most common types of knee injuries. This injury can range from mild to severe. Surgery may be needed for a severe tear. What are the causes? This injury may be caused by any squatting, twisting, or pivoting movement. Sports-related injuries are the most common cause. These often occur from:  Running and stopping suddenly.  Changing direction.  Being tackled or knocked off your feet.  As people get older, their meniscus gets thinner and weaker. In these people, tears can happen more easily, such as from climbing stairs. What increases the risk? This injury is more likely to happen to:  People who play contact sports.  Males.  People who are 99?78 years of age.  What are the signs or symptoms? Symptoms of this injury include:  Knee pain, especially at the side of the knee joint. You may feel pain when the injury occurs, or you may only hear a pop and feel pain later.  A feeling that your knee is clicking, catching, locking, or giving way.  Not being able to fully bend or extend your knee.  Bruising or swelling in your knee.  How is this diagnosed? This injury may be diagnosed based on your symptoms and a physical exam. The physical exam may include:  Moving your knee in different ways.  Feeling for  tenderness.  Listening for a clicking sound.  Checking if your knee locks or catches.  You may also have tests, such as:  X-rays.  MRI.  A procedure to look inside your knee with a narrow surgical telescope (arthroscopy).  You may be referred to a knee specialist (orthopedic surgeon). How is this treated? Treatment for this injury depends on the severity of the tear. Treatment for a mild tear may include:  Rest.  Medicine to reduce pain and swelling. This is usually a nonsteroidal anti-inflammatory drug (NSAID).  A knee brace or an elastic sleeve or wrap.  Using crutches or a walker to keep weight off your knee and to help you walk.  Exercises to strengthen your knee (physical therapy).  You may need surgery if you have a severe tear or if other treatments are not working. Follow these instructions at home: Managing pain and swelling  Take over-the-counter and prescription medicines only as told by your health care provider.  If directed, apply ice to the injured area: ? Put ice in a plastic bag. ? Place a towel between your skin and the bag. ? Leave the ice on for 20 minutes, 2-3 times per day.  Raise (elevate) the injured area above the level of your heart while you are sitting or lying down. Activity  Do not use the injured limb to support your body weight until your health care provider says that  you can. Use crutches or a walker as told by your health care provider.  Return to your normal activities as told by your health care provider. Ask your health care provider what activities are safe for you.  Perform range-of-motion exercises only as told by your health care provider.  Begin doing exercises to strengthen your knee and leg muscles only as told by your health care provider. After you recover, your health care provider may recommend these exercises to help prevent another injury. General instructions  Use a knee brace or elastic wrap as told by your health  care provider.  Keep all follow-up visits as told by your health care provider. This is important. Contact a health care provider if:  You have a fever.  Your knee becomes red, tender, or swollen.  Your pain medicine is not helping.  Your symptoms get worse or do not improve after 2 weeks of home care. This information is not intended to replace advice given to you by your health care provider. Make sure you discuss any questions you have with your health care provider. Document Released: 11/22/2002 Document Revised: 02/07/2016 Document Reviewed: 12/25/2014 Elsevier Interactive Patient Education  [employer].

## 2018-05-12 NOTE — Progress Notes (Signed)
Impression and Recommendations:    1. Essential hypertension   2. Hyperkalemia-secondary to when other doctor started patient on losartan   3. Right knee pain, unspecified chronicity    Knee Pain -Referred pt to Sports medicine -Discussed the risks benefits of PT and sports medicine evaluation in order to improve long term treatment -Explained the role of sports medicine and possible treatments to improve knee function and pain -Recommended NSAIDS when necessary; risk /benefits discussed with patient. -Encouraged pt to continue with ICE for 15-20 minutes, 3-4 times per day. -Pt understands may need MRI if knee becomes unstable/ does not improve as expected -Discussed anatomy of the knee, as well as how different injuries to the soft tissue would impact pain, treatment and recovery  Hyperkalemia  -Will check labs  HTN -Bp currently well controlled; continue treatment plan -Encouraged pt to continue checking her bp at home -Discussed red flag symptoms and to get emergency care if present -Requested follow up appointment in 4 months   Orders Placed This Encounter  Procedures  . Basic metabolic panel  . Ambulatory referral to Sports Medicine    The patient was counselled, risk factors were discussed, anticipatory guidance given.     Return for We are referring you to sports med, follow-up 4 months for chronic conditions.    Please see AVS handed out to patient at the end of our visit for further patient instructions/ counseling done pertaining to today's office visit.  Note: This document was prepared using Dragon voice recognition software and may include unintentional dictation errors.  I have reviewed the above documentation for accuracy and completeness, and I agree with the above.    This document serves as a record of services personally performed by Mellody Dance, MD. It was created on her behalf by Georga Bora, a trained medical scribe. The  creation of this record is based on the scribe's personal observations and the provider's statements to them.   I have reviewed the above medical documentation for accuracy and completeness and I concur.  Mellody Dance 05/13/18 2:36 PM   ------------------------------------------------------------------------------------------------------- Subjective: Kelli [name] is a 77 y.o. female who comes in today in relation to a  Knee injury   -Pt originally felt knee pain after spending a weekend [place] -Dr. Raliegh Scarlet evaluated pt; encouraged pt to ice and rest knee, and take OTC NSAIDs to help with pain  -Pt states she stepped into a hole this weekend -Pt has noticed swelling and a strong increase in pain since this week -States she has been feeling strong pain since this weekend and believes she needs and x-ray or injection -Pt has noticed swelling and a strong increase in pain since this week -Feels pressure whenever she bends down or uses the bathroom -States at night or after activity she feels the pain on the outside of her knee   -Pt states she had been doing exercises to strengthen knee and ensuring she walked on even, flat surfaces since original injury -Is disappointed with regression of knee injury since this weekend  HTN -States she has been taking her blood pressure medication daily -Home bp readings are in the 130s/80s or below -Pt denies red flag symptoms    Patient Care Team    Relationship Specialty Notifications Start End  Mellody Dance, DO PCP - General Family Medicine  11/02/17   [place], Peter M, MD Consulting Physician Cardiology  11/02/17   Allyn Kenner,  MD Consulting Physician Dermatology  11/02/17    Comment: see's them yrly- Dr Theda Sers is who she actually sees  Wonda Horner, MD Consulting Physician Gastroenterology  11/02/17      Past Medical History:  Diagnosis Date  . Atrial fibrillation ([place]) 02/2013   paroxsymal; c/b  Type 2 NSTEMI; CHADS2-VASc=2 (age, sex - patient had come off anti-HTN meds in past); Eliquis started;  Echocardiogram 03/10/13: EF 08-67%, grade 1 diastolic dysfunction, mild MR, mild LAE  . Hyperlipidemia   . Hypertension   . NSTEMI (non-ST elevated myocardial infarction) ([place]) 02/2013   demand ischemia in setting of AFib with RVR; ETT-Myoview 03/11/13: No ischemia or scar, EF 70%.    Past Surgical History:  Procedure Laterality Date  . APPENDECTOMY    . CATARACT EXTRACTION, BILATERAL    . EYE SURGERY    . TONSILLECTOMY      @[facility]AM @  Social History   Substance and Sexual Activity  Drug Use No  ,  Social History   Substance and Sexual Activity  Alcohol Use Yes   Comment: 1-2 glasses of wine occ.  ,  Social History   Tobacco Use  Smoking Status Never Smoker  Smokeless Tobacco Never Used  ,  Social History   Substance and Sexual Activity  Sexual Activity Not Currently    Patient's Medications  New Prescriptions   No medications on file  Previous Medications   ACETAMINOPHEN (TYLENOL) 500 MG TABLET    Take 500 mg by mouth daily as needed for pain.   ATORVASTATIN (LIPITOR) 10 MG TABLET    Take 5 mg by mouth daily.    CALCIUM CARB-CHOLECALCIFEROL (CALCIUM + D3) 600-200 MG-UNIT TABS    Take 1 tablet by mouth daily.   CHOLECALCIFEROL (VITAMIN D) 400 UNITS CAPSULE    Take 400 Units by mouth daily.   DOCUSATE CALCIUM (STOOL SOFTENER PO)    Take by mouth at bedtime.   FIBER ADULT GUMMIES PO    Take 1 Dose by mouth daily.   FISH OIL-OMEGA-3 FATTY ACIDS 1000 MG CAPSULE    Take 1 g by mouth daily.   FLAXSEED, LINSEED, (FLAX SEED OIL PO)    Take by mouth daily.   FLUTICASONE (FLONASE) 50 MCG/ACT NASAL SPRAY    Place 1 spray into both nostrils daily as needed.    LOSARTAN-HYDROCHLOROTHIAZIDE (HYZAAR) 50-12.5 MG TABLET    Take 1 tablet by mouth daily.   MAGNESIUM 300 MG CAPS    Take 1 capsule by mouth daily.   METOPROLOL TARTRATE (LOPRESSOR) 50 MG TABLET    Take 50 mg by mouth 2  (two) times daily. Take one tablet (50 mg) in the morning and 0.5 tablet ( 25 mg) in the evening   MONTELUKAST (SINGULAIR) 10 MG TABLET    Take 1 tablet (10 mg total) by mouth at bedtime.   MULTIPLE VITAMIN (MULTIVITAMIN) CAPSULE    Take 1 capsule by mouth daily.   PANTOPRAZOLE SODIUM (PROTONIX PO)    Take by mouth as needed.   PROBIOTIC PRODUCT (PROBIOTIC DAILY PO)    Take 1 tablet by mouth daily.   VITAMIN C (ASCORBIC ACID) 500 MG TABLET    Take 500 mg by mouth daily.  Modified Medications   Modified Medication Previous Medication   ELIQUIS 5 MG TABS TABLET ELIQUIS 5 MG TABS tablet      TAKE 1 TABLET BY MOUTH 2 TIMES DAILY.    TAKE 1 TABLET BY MOUTH 2 TIMES DAILY.  Discontinued Medications  LOSARTAN-HYDROCHLOROTHIAZIDE (HYZAAR) 50-12.5 MG TABLET    Start with one half tablet daily for 1 week, then 1 full tablet daily as tolerated   METOPROLOL TARTRATE (LOPRESSOR) 50 MG TABLET    TAKE 1 TABLET BY MOUTH 2 TIMES DAILY.    Augmentin [amoxicillin-pot clavulanate]  Scheduled Meds: Continuous Infusions: PRN Meds:.  Review of Systems: General:   Denies fever, chills, unexplained weight loss.  Optho/Auditory:   Denies visual changes, blurred vision/LOV Respiratory:   Denies SOB, DOE more than baseline levels.  Cardiovascular:   Denies chest pain, palpitations, new onset peripheral edema  Gastrointestinal:   Denies nausea, vomiting, diarrhea.  Genitourinary: Denies dysuria, freq/ urgency, flank pain or discharge from genitals.  Endocrine:     Denies hot or cold intolerance, polyuria, polydipsia. Musculoskeletal:   See above in HPI  Skin:  Denies rash, suspicious lesions Neurological:     Denies dizziness, unexplained weakness, numbness  Psychiatric/Behavioral:   Denies mood changes, suicidal or homicidal ideations, hallucinations   Objective:   Blood pressure 126/74, pulse 68, height 5\' 3"  (1.6 m), weight 169 lb (76.7 kg), SpO2 98 %. Body mass index is 29.94 kg/m. General: Well  Developed, well nourished, and in no acute distress.  Neuro: Alert and oriented x3, extra-ocular muscles intact, sensation grossly intact.  HEENT: Normocephalic, atraumatic, pupils equal round reactive to light, neck supple Skin: no gross suspicious lesions or rashes  Cardiac: Regular rate and rhythm, no murmurs rubs or gallops.  Respiratory: Essentially clear to auscultation bilaterally. Not using accessory muscles, speaking in full sentences.  Abdominal: Soft, not grossly distended Musculoskeletal: Ambulates w/o diff, FROM * 4 ext.  Knee:  Scant amount peripatellar swelling o/w essential normal exam; no gross instability to posterior or anterior drawer test; negative varus stress test;  discomfort on valgus stress test FROM without crepitus, mild soft tissue swelling posterior knee, no ecchymosis, normal contralateral knee exam. Neurovascularly intact distally. No pain upon quick palpation of the ipsilateral hip and ankle joints. Vasc: less 2 sec cap RF, warm and pink  Psych: No HI/SI, judgement and insight good, Euthymic mood. Full Affect.

## 2018-05-13 LAB — BASIC METABOLIC PANEL
BUN / CREAT RATIO: 17 (ref 12–28)
BUN: 19 mg/dL (ref 8–27)
CHLORIDE: 102 mmol/L (ref 96–106)
CO2: 25 mmol/L (ref 20–29)
CREATININE: 1.1 mg/dL — AB (ref 0.57–1.00)
Calcium: 9 mg/dL (ref 8.7–10.3)
GFR calc Af Amer: 56 mL/min/{1.73_m2} — ABNORMAL LOW (ref >59)
GFR calc non Af Amer: 49 mL/min/{1.73_m2} — ABNORMAL LOW (ref >59)
GLUCOSE: 106 mg/dL — AB (ref 65–99)
POTASSIUM: 5.4 mmol/L — AB (ref 3.5–5.2)
SODIUM: 142 mmol/L (ref 134–144)

## 2018-05-18 ENCOUNTER — Ambulatory Visit
Admission: RE | Admit: 2018-05-18 | Discharge: 2018-05-18 | Disposition: A | Discharge status: Home / Self Care | Payer: Medicare Other | Source: Ambulatory Visit | Attending: Sports Medicine | Admitting: Sports Medicine

## 2018-05-18 ENCOUNTER — Ambulatory Visit: Payer: Medicare Other | Admitting: Sports Medicine

## 2018-05-18 VITALS — BP 122/80 | Ht 63.0 in | Wt 170.0 lb

## 2018-05-18 DIAGNOSIS — G8929 Other chronic pain: Secondary | ICD-10-CM

## 2018-05-18 DIAGNOSIS — M25561 Pain in right knee: Principal | ICD-10-CM

## 2018-05-18 MED ORDER — METHYLPREDNISOLONE ACETATE 40 MG/ML IJ SUSP
40.0000 mg | Freq: Once | INTRAMUSCULAR | Status: AC
  Administered 2018-05-18: 40 mg via INTRA_ARTICULAR

## 2018-05-18 NOTE — Patient Instructions (Signed)
Thank you for coming to see [place] today in clinic.  We evaluated her right knee at today's clinic.  We believe that the pain is coming from degenerative changes of your knee.  We will get an x-ray today to evaluate the bony structures of your knee.  We provided with some home rehabilitation exercises he should be performing daily.  We also gave you a steroid injection in your knee, which should hopefully give you some pain relief.  We will see back in 4 weeks for reevaluation

## 2018-05-18 NOTE — Progress Notes (Addendum)
CC: Right knee pain  HPI:  19yoF presents for evaluation of right knee pain. Onset was in July 2019. No hx of trauma/ injury. Patient not remember of any particular incident that may have contributed to her pain.  Patient iced her knee and used tylenol PRN. Pain improved until 3 weeks ago when patient started experiencing moderate right lateral knee pain. 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  She did not notice any acute swelling fall incident.  She denied any swelling, pop, locking, or knee pain after incident.  Pain is uncomfortable enough that she has difficulty falling asleep. Pain is described as "numbness" along lateral and medial knee.  Knee pain is worse with prolonged sitting, standing, and walking up and down stairs.  Minimal improvement of pain with ice and OTC tylenol PRN.  She saw PCP last week and noted that knee appeared swollen. PCP recommended evaluation by Sports Med for possible aspiration and steroid injection.  Patient continues to deny any fevers, chills, locking of knee, buckling of knee, or swelling.   Past Injuries: Denies any injuries to the area Past Surgeries: No surgeries to the area Smoking: Non-smoker Family Hx: History of arthritis in mother.  Past medical history, medications, allergies reviewed by myself at today's visit.  ROS: Per HPI; in addition no fever, no rash, no additional weakness, no additional numbness, no additional paresthesias, and no additional falls/injury.   Objective: BP 122/80   Ht 5\' 3"  (1.6 m)   Wt 170 lb (77.1 kg)   BMI 30.11 kg/m  Gen: NAD, well groomed, a/o x3, normal affect.  CV: Well-perfused. Warm.  Resp: Non-labored.  Neuro: Sensation intact throughout. No gross coordination deficits.  Gait: Nonpathologic posture, unremarkable stride without signs of limp or balance issues.  Right knee exam: Mild swelling, no warmth, no erythema.  Rash noted on medial aspect of right knee,  with scaling plaque.  Tenderness palpation on the lateral and medial joint line.  Full range of motion throughout testing, with pain at the upward degree of knee flexion.  5 out of 5 strength throughout all testing.  Negative Lachman test, negative posterior drawer, negative valgus stress testing, negative varus stress testing, negative McMurray test (positive for pain, but no click noted).  ULTRASOUND: Knee, right Diagnostic limited ultrasound imaging obtained of patient's right knee.  - Quadriceps tendon: No appreciated signs of tearing, edema, or calcification.  There is a moderate amount of fluid noted within the suprapatellar pouch.  - Patellar tendon: No appreciated signs of tearing, edema, or calcification. No infrapatellar or tibial tuberosity fluid or abnormality appreciated.  - Medial joint line: Possible chronic tear of medial meniscus, with calcifications noted surrounding.. No increased fluid presence noted. No evidence of osteophyte development or significant joint space loss.  - Lateral joint line: No signs concerning for meniscal pathology appreciated. No increased fluid presence noted. No evidence of osteophyte development or significant joint space loss.    IMPRESSION: findings consistent with effusion of right knee.  Assessment and Plan:  Right knee pain, likely secondary to osteoarthritis versus degenerative meniscal tear.  INJECTION: Patient was given informed consent, signed copy in the chart. Appropriate time out was taken. Area prepped and draped in usual sterile fashion. 1 cc of methylprednisolone 40 mg/ml plus  4 cc of 1% lidocaine without epinephrine was injected into the right knee using a(n) medial approach. The patient tolerated the procedure well. There were no complications. Post procedure  instructions were given.  We discussed with [place] at today's visit that her pain is likely due to arthritis of her right knee versus degenerative changes of the meniscus.  We will  obtain an x-ray today to evaluate the joint space and degenerative changes.  We will provide her with isometric quad exercises for home rehabilitation.  Corticosteroid injection of the right knee as noted above.  We will see her back in 4 weeks.  If she has no improvement at that time, we can consider doing formal physical therapy for rehabilitation.  She starts developing mechanical symptoms, we can consider getting an MRI for evaluation of the meniscus.  Lewanda Rife, MD [facility] Fellow 05/18/2018 10:35 AM   Patient seen and evaluated with the sports medicine fellow. I agree with the above plan of care. X-rays of the right knee were reviewed. There is a paucity of degenerative changes present. Patient likely has a degenerative meniscal tear. Hopefully the injection and her home exercises will help. Follow-up in 4 weeks for reevaluation.

## 2018-05-19 ENCOUNTER — Encounter: Payer: Self-pay | Admitting: Sports Medicine

## 2018-05-25 DIAGNOSIS — M5431 Sciatica, right side: Secondary | ICD-10-CM | POA: Diagnosis not present

## 2018-05-25 DIAGNOSIS — M9903 Segmental and somatic dysfunction of lumbar region: Secondary | ICD-10-CM | POA: Diagnosis not present

## 2018-05-25 DIAGNOSIS — M9904 Segmental and somatic dysfunction of sacral region: Secondary | ICD-10-CM | POA: Diagnosis not present

## 2018-05-25 DIAGNOSIS — M9905 Segmental and somatic dysfunction of pelvic region: Secondary | ICD-10-CM | POA: Diagnosis not present

## 2018-05-25 DIAGNOSIS — M5388 Other specified dorsopathies, sacral and sacrococcygeal region: Secondary | ICD-10-CM | POA: Diagnosis not present

## 2018-06-02 ENCOUNTER — Telehealth: Payer: Self-pay | Admitting: Cardiology

## 2018-06-02 ENCOUNTER — Other Ambulatory Visit: Payer: Self-pay | Admitting: Cardiology

## 2018-06-02 NOTE — Telephone Encounter (Signed)
Pt calling requesting a refill on Eliquis sent to [facility]. Please address

## 2018-06-03 ENCOUNTER — Encounter: Payer: Self-pay | Admitting: Cardiology

## 2018-06-14 NOTE — Progress Notes (Signed)
Kelli [name] Date of Birth: December 21, 1940 Medical Record #[id number]  History of Present Illness: Kelli [name] is seen for follow up of atrial fibrillation.  She has a history of HTN, paroxysmal Afib with RVR. Admitted in 2014. Had demand ischemia. Myoview study was normal.  Echo from Kelli [name] of 2014 with EF of 55 to 60% and grade 1 diastolic dysfunction. She has been on chronic therapy with metoprolol and Eliquis. On her last visit losartan was added for BP control. When seen by Dr. Raliegh [name] HCT was added at 12.5 mg.   On follow up today she is doing well from a cardiac standpoint. She denies any chest pain, SOB, pallpitations. She is focusing more on weight loss and has lost 5 lbs.  BP at home has been excellent. 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   Current Outpatient Medications  Medication Sig Dispense Refill  . acetaminophen (TYLENOL) 500 MG tablet Take 500 mg by mouth daily as needed for pain.    [name] atorvastatin (LIPITOR) 10 MG tablet Take 5 mg by mouth daily.     . Calcium Carb-Cholecalciferol (CALCIUM + D3) 600-200 MG-UNIT TABS Take 1 tablet by mouth daily.    . Cholecalciferol (VITAMIN D) 400 UNITS capsule Take 400 Units by mouth daily.    Kelli [name] Calcium (STOOL SOFTENER PO) Take by mouth at bedtime.    [name] ELIQUIS 5 MG TABS tablet TAKE 1 TABLET BY MOUTH 2 TIMES DAILY. 180 tablet 1  . FIBER ADULT GUMMIES PO Take 1 Dose by mouth daily.    . fish oil-omega-3 fatty acids 1000 MG capsule Take 1 g by mouth daily.    . Flaxseed, Linseed, (FLAX SEED OIL PO) Take by mouth daily.    . fluticasone (FLONASE) 50 MCG/ACT nasal spray Place 1 spray into both nostrils daily as needed.     [name] losartan-hydrochlorothiazide (HYZAAR) 50-12.5 MG tablet Take 1 tablet by mouth daily.    . Magnesium 300 MG CAPS Take 1 capsule by mouth daily.    . metoprolol tartrate (LOPRESSOR) 50 MG tablet Take 50 mg by mouth 2 (two) times daily. Take one tablet (50 mg) in the morning and 0.5 tablet ( 25 mg) in the evening     . montelukast (SINGULAIR) 10 MG tablet Take 1 tablet (10 mg total) by mouth at bedtime. (Patient taking differently: Take 10 mg by mouth daily as needed. ) 90 tablet 1  . Multiple Vitamin (MULTIVITAMIN) capsule Take 1 capsule by mouth daily.    . Pantoprazole Sodium (PROTONIX PO) Take by mouth as needed.    . Probiotic Product (PROBIOTIC DAILY PO) Take 1 tablet by mouth daily.    . vitamin C (ASCORBIC ACID) 500 MG tablet Take 500 mg by mouth daily.     No current facility-administered medications for this visit.     Allergies  Allergen Reactions  . Augmentin [Amoxicillin-Pot Clavulanate] Itching and Rash    Past Medical History:  Diagnosis Date  . Atrial fibrillation ([place]) 02/2013   paroxsymal; c/b Type 2 NSTEMI; CHADS2-VASc=2 (age, sex - patient had come off anti-HTN meds in past); Eliquis started;  Echocardiogram 03/10/13: EF 37-04%, grade 1 diastolic dysfunction, mild MR, mild LAE  . Hyperlipidemia   . Hypertension   . NSTEMI (non-ST elevated myocardial infarction) ([place]) 02/2013   demand ischemia in setting of AFib with RVR; ETT-Myoview 03/11/13: No ischemia or scar, EF 70%.    Past Surgical History:  Procedure Laterality Date  . APPENDECTOMY    .  CATARACT EXTRACTION, BILATERAL    . EYE SURGERY    . TONSILLECTOMY      Social History   Tobacco Use  Smoking Status Never Smoker  Smokeless Tobacco Never Used    Social History   Substance and Sexual Activity  Alcohol Use Yes   Comment: 1-2 glasses of wine occ.    Family History  Problem Relation Age of Onset  . Heart attack Mother 49  . Cancer Maternal Aunt        throat    Review of Systems: The review of systems is per the HPI.  All other systems were reviewed and are negative.  Physical Exam: BP 136/70   Pulse 62   Ht 5\' 3"  (1.6 m)   Wt 165 lb 12.8 oz (75.2 kg)   BMI 29.37 kg/m  GENERAL:  Well appearing WF in NAD HEENT:  PERRL, EOMI, sclera are clear. Oropharynx is clear. NECK:  No jugular venous  distention, carotid upstroke brisk and symmetric, no bruits, no thyromegaly or adenopathy LUNGS:  Clear to auscultation bilaterally CHEST:  Unremarkable HEART:  RRR,  PMI not displaced or sustained,S1 and S2 within normal limits, no S3, no S4: no clicks, no rubs, no murmurs ABD:  Soft, nontender. BS +, no masses or bruits. No hepatomegaly, no splenomegaly EXT:  2 + pulses throughout, no edema, no cyanosis no clubbing SKIN:  Warm and dry.  No rashes NEURO:  Alert and oriented x 3. Cranial nerves II through XII intact. PSYCH:  Cognitively intact      LABORATORY DATA:  Lab Results  Component Value Date   WBC 9.3 11/19/2017   HGB 13.3 11/19/2017   HCT 40.0 11/19/2017   PLT 220 11/19/2017   GLUCOSE 106 (H) 05/12/2018   CHOL 169 11/19/2017   TRIG 108 11/19/2017   HDL 50 11/19/2017   LDLCALC 97 11/19/2017   ALT 29 11/19/2017   AST 31 11/19/2017   NA 142 05/12/2018   K 5.4 (H) 05/12/2018   CL 102 05/12/2018   CREATININE 1.10 (H) 05/12/2018   BUN 19 05/12/2018   CO2 25 05/12/2018   TSH 1.020 11/19/2017   INR 1.19 01/03/2014   HGBA1C 5.8 (H) 02/16/2018   Labs dated 12/09/16: cholesterol 180, triglycerides 93, HDL 48, LDL 113. CMET normal.  Ecg today shows NSR with normal Ecg. I have personally reviewed and interpreted this study.  Assessment / Plan: 1.  PAF - remains in NSR. Continue metoprolol and Eliquis.   2.  Chronic anticoagulation- tolerating well.  3. HTN - BP controlled on losartan HCT and metoprolol. She is going to have follow up BMET per Dr. Raliegh [name]. Encourage increased aerobic activity and encouraged with weight loss efforts.  I will follow up in 6 months.

## 2018-06-15 ENCOUNTER — Ambulatory Visit: Payer: Medicare Other | Admitting: Sports Medicine

## 2018-06-16 ENCOUNTER — Encounter

## 2018-06-16 ENCOUNTER — Encounter: Payer: Self-pay | Admitting: Cardiology

## 2018-06-16 ENCOUNTER — Ambulatory Visit: Payer: Medicare Other | Admitting: Cardiology

## 2018-06-16 VITALS — BP 136/70 | HR 62 | Ht 63.0 in | Wt 165.8 lb

## 2018-06-16 DIAGNOSIS — I10 Essential (primary) hypertension: Secondary | ICD-10-CM

## 2018-06-16 DIAGNOSIS — I480 Paroxysmal atrial fibrillation: Secondary | ICD-10-CM | POA: Diagnosis not present

## 2018-06-16 NOTE — Patient Instructions (Signed)
Continue your current therapy  I will see you in 6 months.   

## 2018-07-21 DIAGNOSIS — M9905 Segmental and somatic dysfunction of pelvic region: Secondary | ICD-10-CM | POA: Diagnosis not present

## 2018-07-21 DIAGNOSIS — M9903 Segmental and somatic dysfunction of lumbar region: Secondary | ICD-10-CM | POA: Diagnosis not present

## 2018-07-21 DIAGNOSIS — M9904 Segmental and somatic dysfunction of sacral region: Secondary | ICD-10-CM | POA: Diagnosis not present

## 2018-07-21 DIAGNOSIS — M5431 Sciatica, right side: Secondary | ICD-10-CM | POA: Diagnosis not present

## 2018-07-21 DIAGNOSIS — M5388 Other specified dorsopathies, sacral and sacrococcygeal region: Secondary | ICD-10-CM | POA: Diagnosis not present

## 2018-08-02 DIAGNOSIS — M5388 Other specified dorsopathies, sacral and sacrococcygeal region: Secondary | ICD-10-CM | POA: Diagnosis not present

## 2018-08-02 DIAGNOSIS — M9903 Segmental and somatic dysfunction of lumbar region: Secondary | ICD-10-CM | POA: Diagnosis not present

## 2018-08-02 DIAGNOSIS — M9905 Segmental and somatic dysfunction of pelvic region: Secondary | ICD-10-CM | POA: Diagnosis not present

## 2018-08-02 DIAGNOSIS — M5431 Sciatica, right side: Secondary | ICD-10-CM | POA: Diagnosis not present

## 2018-08-02 DIAGNOSIS — M9904 Segmental and somatic dysfunction of sacral region: Secondary | ICD-10-CM | POA: Diagnosis not present

## 2018-08-09 ENCOUNTER — Encounter: Payer: Self-pay | Admitting: Family Medicine

## 2018-08-09 ENCOUNTER — Ambulatory Visit (INDEPENDENT_AMBULATORY_CARE_PROVIDER_SITE_OTHER): Payer: Medicare Other | Admitting: Family Medicine

## 2018-08-09 VITALS — BP 127/72 | HR 54 | Temp 97.6°F | Ht 63.0 in | Wt 167.5 lb

## 2018-08-09 DIAGNOSIS — E2839 Other primary ovarian failure: Secondary | ICD-10-CM

## 2018-08-09 DIAGNOSIS — I214 Non-ST elevation (NSTEMI) myocardial infarction: Secondary | ICD-10-CM

## 2018-08-09 DIAGNOSIS — E875 Hyperkalemia: Secondary | ICD-10-CM | POA: Diagnosis not present

## 2018-08-09 DIAGNOSIS — I4891 Unspecified atrial fibrillation: Secondary | ICD-10-CM | POA: Diagnosis not present

## 2018-08-09 DIAGNOSIS — E782 Mixed hyperlipidemia: Secondary | ICD-10-CM

## 2018-08-09 DIAGNOSIS — Z23 Encounter for immunization: Secondary | ICD-10-CM

## 2018-08-09 DIAGNOSIS — I10 Essential (primary) hypertension: Secondary | ICD-10-CM

## 2018-08-09 DIAGNOSIS — R7303 Prediabetes: Secondary | ICD-10-CM | POA: Diagnosis not present

## 2018-08-09 LAB — POCT GLYCOSYLATED HEMOGLOBIN (HGB A1C): Hemoglobin A1C: 5.7 % — AB (ref 4.0–5.6)

## 2018-08-09 NOTE — Progress Notes (Signed)
Impression and Recommendations:    1. Prediabetes   2. NSTEMI (non-ST elevated myocardial infarction) ([place])   3. Atrial fibrillation with RVR ([place])   4. Mixed hyperlipidemia   5. Essential hypertension   6. Estrogen deficiency   7. Hyperkalemia-secondary to when other doctor started patient on losartan   8. Flu vaccine need     - Need for flu vaccine today.  Lengthy education was provided to patient, along with nformational handouts.  - Discussed safety and indication for vaccinations at length with patient today.  Education, reassurance, and extensive counseling was provided.  Handouts were offered for patient education regarding flu vaccine, shingles vaccine, and pneumonia vaccine, etc.  - Recommended patient to review the [facility] website.  1. Prediabetes - A1c improving, down to 5.6 at this time. - Continue monitoring exercise and diet.  - Counseled patient on prevention of disease and discussed prudent dietary and lifestyle modifications as first line.  Importance of low carb/ketogenic diet discussed with patient in addition to regular exercise.   2. Hypertension - Blood pressure controlled at this time. - Continue treatment plan as prescribed.  See med list below. - Patient tolerating meds well without complication.  Denies S-E  - Lifestyle changes such as dash diet and engaging in a regular exercise program discussed with patient.  Educational handouts provided.  - Ambulatory BP monitoring encouraged. Keep log and bring in next OV.  - Encouraged patient to drink more water, avoid salt, and lose a bit more weight to help continue to lower blood pressure.  3. Hyperlipidemia - Patient continues on statin management as prescribed. - Continue treatment plan as recommended.  See med list below. - Patient tolerating meds well without complication.  Denies S-E.  - Dietary changes such as low saturated & trans fat and low carb/ ketogenic diets discussed with patient.   Encouraged regular exercise and weight loss when appropriate.   4. BMI Counseling - BMI 29.7 Explained to patient what BMI refers to, and what it means medically.    Told patient to think about it as a "medical risk stratification measurement" and how increasing BMI is associated with increasing risk/ or worsening state of various diseases such as hypertension, hyperlipidemia, diabetes, premature OA, depression etc.  American Heart Association guidelines for healthy diet, basically Mediterranean diet, and exercise guidelines of 30 minutes 5 days per week or more discussed in detail.  Health counseling performed.  All questions answered.  - Encouraged patient to continue losing weight per patient goals.  5. Lifestyle & Preventative Health Maintenance - Advised patient to continue working toward exercising to improve overall mental, physical, and emotional health.    - Reviewed the "spokes of the wheel" of mood and health management.  Stressed the importance of ongoing prudent habits, including regular exercise, appropriate sleep hygiene, healthful dietary habits, and prayer/meditation to relax.  - Encouraged patient to engage in daily physical activity, especially a formal exercise routine.  Recommended that the patient eventually strive for at least 150 minutes of moderate cardiovascular activity per week according to guidelines established by the [facility].   - Healthy dietary habits encouraged, including low-carb, and high amounts of lean protein in diet.   - Patient should also consume adequate amounts of water.  6. Follow-Up - Prescriptions refilled today PRN. - Re-check fasting lab work as recommended. - Otherwise, continue to return for CPE and chronic follow-up as scheduled.   - Patient knows to call in sooner if desired to address  acute concerns.    Education and routine counseling performed. Handouts provided.   Orders Placed This Encounter  Procedures  . DG Bone Density  . Flu  vaccine HIGH DOSE PF (Fluzone High dose)  . Basic metabolic panel  . POCT glycosylated hemoglobin (Hb A1C)    Medications Discontinued During This Encounter  Medication Reason  . Pantoprazole Sodium (PROTONIX PO) No longer needed (for PRN medications)      The patient was counseled, risk factors were discussed, anticipatory guidance given.  Gross side effects, risk and benefits, and alternatives of medications discussed with patient.  Patient is aware that all medications have potential side effects and we are unable to predict every side effect or drug-drug interaction that may occur.  Expresses verbal understanding and consents to current therapy plan and treatment regimen.   Return for 47mo bp, chol etc, and also for yrly medicare wellness q july.   Please see AVS handed out to patient at the end of our visit for further patient instructions/ counseling done pertaining to today's office visit.    Note:  This document was prepared using Dragon voice recognition software and may include unintentional dictation errors.   This document serves as a record of services personally performed by DMellody Dance DO. It was created on her behalf by KToni Amend a trained medical scribe. The creation of this record is based on the scribe's personal observations and the provider's statements to them.   I have reviewed the above medical documentation for accuracy and completeness and I concur.  DMellody Dance DO 08/09/2018 12:52 PM        Subjective:    Chief Complaint  Patient presents with  . Follow-up    Kelli [name] is a 77y.o. female [facility].    Patient recently had acute sinusitis; was seen elsewhere.  Stopped taking Singulair and notes "I think that's why I got the sinus problem."  States her mucus is thick and she can really "feel the phlegm."  Prediabetes HPI: -  She has largely  been working on diet.    Admits she needs to exercise more.  States she had a "bad weekend" where she ate a lot of sweets with family.   Denies polyuria/polydipsia. Denies hypo/ hyperglycemia symptoms - She denies new onset of: chest pain, exercise intolerance, shortness of breath, dizziness, visual changes, headache, lower extremity swelling or claudication.   Last A1C in the office was:  Lab Results  Component Value Date   HGBA1C 5.7 (A) 08/09/2018   HGBA1C 5.8 (H) 02/16/2018   HGBA1C 5.7 (H) 11/19/2017    Lab Results  Component Value Date   LDLCALC 97 11/19/2017   CREATININE 1.10 (H) 05/12/2018    Hypertension HPI: -  Her blood pressure has been controlled at home.  Pt is checking it at home.  Notes it's 'been much better."  Getting high 120's and low 130's at home.  Saw Cardiology in October; they were happy with changes that had been made.  Patient states her appointment went well.  Has lost a little bit of weight.  Since her last appointment, she is down 5-6 lbs.  Patient states she would love to lose 15 lbs more.  Notes she is not exercising "like she should" and "needs to get back."  Notes she exercises when the weather is nice, not when it is too hot or too cold.  - Patient reports  good compliance with blood pressure medications.  - Denies medication S-E.   - Smoking Status noted   - She denies new onset of: chest pain, exercise intolerance, shortness of breath, dizziness, visual changes, headache, lower extremity swelling or claudication.   Last 3 blood pressure readings in our office are as follows: BP Readings from Last 3 Encounters:  08/09/18 127/72  06/16/18 136/70  05/18/18 122/80    Filed Weights   08/09/18 0855  Weight: 167 lb 8 oz (76 kg)    Cholesterol HPI: 1. 77 y.o. female here for cholesterol follow-up.   - Patient reports good compliance with medications or treatment plan.  Continues on half tablet of Lipitor.  Has a lot of joint and muscle  pain, but "nothing uncontrollable."  States she's always had aching; at one point, a doctor told her she had fibromyalgia, and she said "I don't claim that."  - Denies medication S-E   - Smoking Status noted   - She denies new onset of: chest pain, exercise intolerance, shortness of breath, dizziness, visual changes, headache, lower extremity swelling or claudication.   Continues to have joint pain and baseline myalgias.  The cholesterol last visit was:  Lab Results  Component Value Date   CHOL 169 11/19/2017   HDL 50 11/19/2017   LDLCALC 97 11/19/2017   TRIG 108 11/19/2017   CHOLHDL 3.4 11/19/2017    Hepatic Function Latest Ref Rng & Units 11/19/2017 12/09/2016 11/23/2015  Total Protein 6.0 - 8.5 g/dL 6.5 - -  Albumin 3.5 - 4.8 g/dL 3.8 - -  AST 0 - 40 IU/L _0 ALT 0 - 32 IU/L _1 Alk Phosphatase 39 - 117 IU/L 67 63 68  Total Bilirubin 0.0 - 1.2 mg/dL 0.5 - -     BMI Readings from Last 3 Encounters:  08/09/18 29.67 kg/m  06/16/18 29.37 kg/m  05/18/18 30.11 kg/m     No problems updated.    Patient Care Team    Relationship Specialty Notifications Start End  Mellody Dance, DO PCP - General Family Medicine  11/02/17   Martinique, Peter M, MD Consulting Physician Cardiology  11/02/17   Allyn Kenner, MD Consulting Physician Dermatology  11/02/17    Comment: see's them yrly- Dr Theda Sers is who she actually sees  Wonda Horner, MD Consulting Physician Gastroenterology  11/02/17      Patient Active Problem List   Diagnosis Date Noted  . NSTEMI (non-ST elevated myocardial infarction) ([place]) 03/10/2013    Priority: High  . Hyperkalemia 04/07/2018  . Prediabetes 04/07/2018  . Serum potassium elevated 04/07/2018  . Right knee pain 04/07/2018  . Bursitis of right knee 04/07/2018  . Osteoarthritis of knee 04/07/2018  . Postmenopausal bone loss 11/19/2017  . Hyperlipidemia 11/02/2017  . Gastroesophageal reflux disease 11/02/2017  . Allergic rhinitis due to pollen  11/02/2017  . Environmental and seasonal allergies 11/02/2017  . Vitamin D deficiency 11/02/2017  . HTN (hypertension) 03/10/2014  . Perforated diverticulitis 12/24/2013  . Chronic constipation 12/24/2013  . Fatigue 08/18/2013  . Atrial fibrillation with RVR ([place]) 03/09/2013  . Elevated troponin 03/09/2013     Past Medical History:  Diagnosis Date  . Atrial fibrillation ([place]) 02/2013   paroxsymal; c/b Type 2 NSTEMI; CHADS2-VASc=2 (age, sex - patient had come off anti-HTN meds in past); Eliquis started;  Echocardiogram 03/10/13: EF 08-67%, grade 1 diastolic dysfunction, mild MR, mild LAE  . Hyperlipidemia   . Hypertension   . NSTEMI (non-ST elevated  myocardial infarction) ([place]) 02/2013   demand ischemia in setting of AFib with RVR; ETT-Myoview 03/11/13: No ischemia or scar, EF 70%.     Past Surgical History:  Procedure Laterality Date  . APPENDECTOMY    . CATARACT EXTRACTION, BILATERAL    . EYE SURGERY    . TONSILLECTOMY       Family History  Problem Relation Age of Onset  . Heart attack Mother 24  . Cancer Maternal Aunt        throat     Social History   Substance and Sexual Activity  Drug Use No  ,  Social History   Substance and Sexual Activity  Alcohol Use Yes   Comment: 1-2 glasses of wine occ.  ,  Social History   Tobacco Use  Smoking Status Never Smoker  Smokeless Tobacco Never Used  ,    Current Outpatient Medications on File Prior to Visit  Medication Sig Dispense Refill  . acetaminophen (TYLENOL) 500 MG tablet Take 500 mg by mouth daily as needed for pain.    [name] atorvastatin (LIPITOR) 10 MG tablet Take 5 mg by mouth daily.     . Calcium Carb-Cholecalciferol (CALCIUM + D3) 600-200 MG-UNIT TABS Take 1 tablet by mouth daily.    . Cholecalciferol (VITAMIN D) 400 UNITS capsule Take 400 Units by mouth daily.    [name] Calcium (STOOL SOFTENER PO) Take by mouth at bedtime.    [name] ELIQUIS 5 MG TABS tablet TAKE 1 TABLET BY MOUTH 2 TIMES DAILY. 180 tablet  1  . FIBER ADULT GUMMIES PO Take 1 Dose by mouth daily.    . fish oil-omega-3 fatty acids 1000 MG capsule Take 1 g by mouth daily.    . Flaxseed, Linseed, (FLAX SEED OIL PO) Take by mouth daily.    . fluticasone (FLONASE) 50 MCG/ACT nasal spray Place 1 spray into both nostrils daily as needed.     [name] losartan-hydrochlorothiazide (HYZAAR) 50-12.5 MG tablet Take 1 tablet by mouth daily.    . Magnesium 300 MG CAPS Take 1 capsule by mouth daily.    . metoprolol tartrate (LOPRESSOR) 50 MG tablet Take 50 mg by mouth 2 (two) times daily. Take one tablet (50 mg) in the morning and 0.5 tablet ( 25 mg) in the evening    . montelukast (SINGULAIR) 10 MG tablet Take 1 tablet (10 mg total) by mouth at bedtime. (Patient taking differently: Take 10 mg by mouth daily as needed. ) 90 tablet 1  . Multiple Vitamin (MULTIVITAMIN) capsule Take 1 capsule by mouth daily.    . Probiotic Product (PROBIOTIC DAILY PO) Take 1 tablet by mouth daily.    . vitamin C (ASCORBIC ACID) 500 MG tablet Take 500 mg by mouth daily.     No current facility-administered medications on file prior to visit.      Allergies  Allergen Reactions  . Augmentin [Amoxicillin-Pot Clavulanate] Itching and Rash     Review of Systems:   General:  Denies fever, chills Optho/Auditory:   Denies visual changes, blurred vision Respiratory:   Denies SOB, cough, wheeze, DIB  Cardiovascular:   Denies chest pain, palpitations, painful respirations Gastrointestinal:   Denies nausea, vomiting, diarrhea.  Endocrine:     Denies new hot or cold intolerance Musculoskeletal:  Denies joint swelling, gait issues, or new unexplained myalgias/ arthralgias Skin:  Denies rash, suspicious lesions  Neurological:    Denies dizziness, unexplained weakness, numbness  Psychiatric/Behavioral:   Denies mood changes    Objective:  Blood pressure 127/72, pulse (!) 54, temperature 97.6 F (36.4 C), height _0  (1.6 m), weight 167 lb 8 oz (76 kg), SpO2 99  %.  Body mass index is 29.67 kg/m.  General: Well Developed, well nourished, and in no acute distress.  HEENT: Normocephalic, atraumatic, pupils equal round reactive to light, neck supple, No carotid bruits, no JVD Skin: Warm and dry, cap RF less 2 sec Cardiac: Heart sounds irregularly irregular, S1, S2 WNL's, no murmurs rubs or gallops Respiratory: ECTA B/L, Not using accessory muscles, speaking in full sentences. NeuroM-Sk: Ambulates w/o assistance, moves ext * 4 w/o difficulty, sensation grossly intact.  Ext: scant edema b/l lower ext Psych: No HI/SI, judgement and insight good, Euthymic mood. Full Affect.

## 2018-08-09 NOTE — Patient Instructions (Signed)
Your goal blood pressure should be 135/85 or less on a regular basis, or medications should be started/ modified.       Hypertension Hypertension, commonly called high blood pressure, is when the force of blood pumping through the arteries is too strong. The arteries are the blood vessels that carry blood from the heart throughout the body. Hypertension forces the heart to work harder to pump blood and may cause arteries to become narrow or stiff. Having untreated or uncontrolled hypertension can cause heart attacks, strokes, kidney disease, and other problems. A blood pressure reading consists of a higher number over a lower number. Ideally, your blood pressure should be below 120/80. The first ("top") number is called the systolic pressure. It is a measure of the pressure in your arteries as your heart beats. The second ("bottom") number is called the diastolic pressure. It is a measure of the pressure in your arteries as the heart relaxes. What are the causes? The cause of this condition is not known. What increases the risk? Some risk factors for high blood pressure are under your control. Others are not. Factors you can change  Smoking.  Having type 2 diabetes mellitus, high cholesterol, or both.  Not getting enough exercise or physical activity.  Being overweight.  Having too much fat, sugar, calories, or salt (sodium) in your diet.  Drinking too much alcohol. Factors that are difficult or impossible to change  Having chronic kidney disease.  Having a family history of high blood pressure.  Age. Risk increases with age.  Race. You may be at higher risk if you are African-American.  Gender. Men are at higher risk than women before age 42. After age 40, women are at higher risk than men.  Having obstructive sleep apnea.  Stress. What are the signs or symptoms? Extremely high blood pressure (hypertensive crisis) may cause:  Headache.  Anxiety.  Shortness of  breath.  Nosebleed.  Nausea and vomiting.  Severe chest pain.  Jerky movements you cannot control (seizures).  How is this diagnosed? This condition is diagnosed by measuring your blood pressure while you are seated, with your arm resting on a surface. The cuff of the blood pressure monitor will be placed directly against the skin of your upper arm at the level of your heart. It should be measured at least twice using the same arm. Certain conditions can cause a difference in blood pressure between your right and left arms. Certain factors can cause blood pressure readings to be lower or higher than normal (elevated) for a short period of time:  When your blood pressure is higher when you are in a health care provider's office than when you are at home, this is called white coat hypertension. Most people with this condition do not need medicines.  When your blood pressure is higher at home than when you are in a health care provider's office, this is called masked hypertension. Most people with this condition may need medicines to control blood pressure.  If you have a high blood pressure reading during one visit or you have normal blood pressure with other risk factors:  You may be asked to return on a different day to have your blood pressure checked again.  You may be asked to monitor your blood pressure at home for 1 week or longer.  If you are diagnosed with hypertension, you may have other blood or imaging tests to help your health care provider understand your overall risk for other conditions. How  is this treated? This condition is treated by making healthy lifestyle changes, such as eating healthy foods, exercising more, and reducing your alcohol intake. Your health care provider may prescribe medicine if lifestyle changes are not enough to get your blood pressure under control, and if:  Your systolic blood pressure is above 130.  Your diastolic blood pressure is above  80.  Your personal target blood pressure may vary depending on your medical conditions, your age, and other factors. Follow these instructions at home: Eating and drinking  Eat a diet that is high in fiber and potassium, and low in sodium, added sugar, and fat. An example eating plan is called the DASH (Dietary Approaches to Stop Hypertension) diet. To eat this way: ? Eat plenty of fresh fruits and vegetables. Try to fill half of your plate at each meal with fruits and vegetables. ? Eat whole grains, such as whole wheat pasta, brown rice, or whole grain bread. Fill about one quarter of your plate with whole grains. ? Eat or drink low-fat dairy products, such as skim milk or low-fat yogurt. ? Avoid fatty cuts of meat, processed or cured meats, and poultry with skin. Fill about one quarter of your plate with lean proteins, such as fish, chicken without skin, beans, eggs, and tofu. ? Avoid premade and processed foods. These tend to be higher in sodium, added sugar, and fat.  Reduce your daily sodium intake. Most people with hypertension should eat less than 1,500 mg of sodium a day.  Limit alcohol intake to no more than 1 drink a day for nonpregnant women and 2 drinks a day for men. One drink equals 12 oz of beer, 5 oz of wine, or 1 oz of hard liquor. Lifestyle  Work with your health care provider to maintain a healthy body weight or to lose weight. Ask what an ideal weight is for you.  Get at least 30 minutes of exercise that causes your heart to beat faster (aerobic exercise) most days of the week. Activities may include walking, swimming, or biking.  Include exercise to strengthen your muscles (resistance exercise), such as pilates or lifting weights, as part of your weekly exercise routine. Try to do these types of exercises for 30 minutes at least 3 days a week.  Do not use any products that contain nicotine or tobacco, such as cigarettes and e-cigarettes. If you need help quitting, ask  your health care provider.  Monitor your blood pressure at home as told by your health care provider.  Keep all follow-up visits as told by your health care provider. This is important. Medicines  Take over-the-counter and prescription medicines only as told by your health care provider. Follow directions carefully. Blood pressure medicines must be taken as prescribed.  Do not skip doses of blood pressure medicine. Doing this puts you at risk for problems and can make the medicine less effective.  Ask your health care provider about side effects or reactions to medicines that you should watch for. Contact a health care provider if:  You think you are having a reaction to a medicine you are taking.  You have headaches that keep coming back (recurring).  You feel dizzy.  You have swelling in your ankles.  You have trouble with your vision. Get help right away if:  You develop a severe headache or confusion.  You have unusual weakness or numbness.  You feel faint.  You have severe pain in your chest or abdomen.  You vomit repeatedly.  You have trouble breathing. Summary  Hypertension is when the force of blood pumping through your arteries is too strong. If this condition is not controlled, it may put you at risk for serious complications.  Your personal target blood pressure may vary depending on your medical conditions, your age, and other factors. For most people, a normal blood pressure is less than 120/80.  Hypertension is treated with lifestyle changes, medicines, or a combination of both. Lifestyle changes include weight loss, eating a healthy, low-sodium diet, exercising more, and limiting alcohol. This information is not intended to replace advice given to you by your health care provider. Make sure you discuss any questions you have with your health care provider. Document Released: 09/01/2005 Document Revised: 07/30/2016 Document Reviewed: 07/30/2016 Elsevier  Interactive Patient Education  2018 [employer].    How to Take Your Blood Pressure   Blood pressure is a measurement of how strongly your blood is pressing against the walls of your arteries. Arteries are blood vessels that carry blood from your heart throughout your body. Your health care provider takes your blood pressure at each office visit. You can also take your own blood pressure at home with a blood pressure machine. You may need to take your own blood pressure:  To confirm a diagnosis of high blood pressure (hypertension).  To monitor your blood pressure over time.  To make sure your blood pressure medicine is working.  Supplies needed: To take your blood pressure, you will need a blood pressure machine. You can buy a blood pressure machine, or blood pressure monitor, at most drugstores or online. There are several types of home blood pressure monitors. When choosing one, consider the following:  Choose a monitor that has an arm cuff.  Choose a monitor that wraps snugly around your upper arm. You should be able to fit only one finger between your arm and the cuff.  Do not choose a monitor that measures your blood pressure from your wrist or finger.  Your health care provider can suggest a reliable monitor that will meet your needs. How to prepare To get the most accurate reading, avoid the following for 30 minutes before you check your blood pressure:  Drinking caffeine.  Drinking alcohol.  Eating.  Smoking.  Exercising.  Five minutes before you check your blood pressure:  Empty your bladder.  Sit quietly without talking in a dining chair, rather than in a soft couch or armchair.  How to take your blood pressure To check your blood pressure, follow the instructions in the manual that came with your blood pressure monitor. If you have a digital blood pressure monitor, the instructions may be as follows: 1. Sit up straight. 2. Place your feet on the floor. Do  not cross your ankles or legs. 3. Rest your left arm at the level of your heart on a table or desk or on the arm of a chair. 4. Pull up your shirt sleeve. 5. Wrap the blood pressure cuff around the upper part of your left arm, 1 inch (2.5 cm) above your elbow. It is best to wrap the cuff around bare skin. 6. Fit the cuff snugly around your arm. You should be able to place only one finger between the cuff and your arm. 7. Position the cord inside the groove of your elbow. 8. Press the power button. 9. Sit quietly while the cuff inflates and deflates. 10. Read the digital reading on the monitor screen and write it down (record it).  11. Wait 2-3 minutes, then repeat the steps, starting at step 1.  What does my blood pressure reading mean? A blood pressure reading consists of a higher number over a lower number. Ideally, your blood pressure should be below 120/80. The first ("top") number is called the systolic pressure. It is a measure of the pressure in your arteries as your heart beats. The second ("bottom") number is called the diastolic pressure. It is a measure of the pressure in your arteries as the heart relaxes. Blood pressure is classified into four stages. The following are the stages for adults who do not have a short-term serious illness or a chronic condition. Systolic pressure and diastolic pressure are measured in a unit called mm Hg. Normal  Systolic pressure: below 940.  Diastolic pressure: below 80. Elevated  Systolic pressure: 768-088.  Diastolic pressure: below 80. Hypertension stage 1  Systolic pressure: 110-315.  Diastolic pressure: 94-58. Hypertension stage 2  Systolic pressure: 592 or above.  Diastolic pressure: 90 or above. You can have prehypertension or hypertension even if only the systolic or only the diastolic number in your reading is higher than normal. Follow these instructions at home:  Check your blood pressure as often as recommended by your  health care provider.  Take your monitor to the next appointment with your health care provider to make sure: ? That you are using it correctly. ? That it provides accurate readings.  Be sure you understand what your goal blood pressure numbers are.  Tell your health care provider if you are having any side effects from blood pressure medicine. Contact a health care provider if:  Your blood pressure is consistently high. Get help right away if:  Your systolic blood pressure is higher than 180.  Your diastolic blood pressure is higher than 110. This information is not intended to replace advice given to you by your health care provider. Make sure you discuss any questions you have with your health care provider. Document Released: 02/08/2016 Document Revised: 04/22/2016 Document Reviewed: 02/08/2016 Elsevier Interactive Patient Education  [employer].

## 2018-08-10 LAB — BASIC METABOLIC PANEL
BUN/Creatinine Ratio: 19 (ref 12–28)
BUN: 21 mg/dL (ref 8–27)
CALCIUM: 9.2 mg/dL (ref 8.7–10.3)
CHLORIDE: 101 mmol/L (ref 96–106)
CO2: 26 mmol/L (ref 20–29)
Creatinine, Ser: 1.08 mg/dL — ABNORMAL HIGH (ref 0.57–1.00)
GFR calc Af Amer: 57 mL/min/{1.73_m2} — ABNORMAL LOW (ref >59)
GFR calc non Af Amer: 49 mL/min/{1.73_m2} — ABNORMAL LOW (ref >59)
Glucose: 85 mg/dL (ref 65–99)
POTASSIUM: 4.4 mmol/L (ref 3.5–5.2)
Sodium: 141 mmol/L (ref 134–144)

## 2018-08-14 ENCOUNTER — Other Ambulatory Visit: Payer: Self-pay | Admitting: Cardiology

## 2018-08-16 ENCOUNTER — Telehealth: Payer: Self-pay | Admitting: Family Medicine

## 2018-08-16 ENCOUNTER — Other Ambulatory Visit: Payer: Self-pay

## 2018-08-16 ENCOUNTER — Encounter: Payer: Self-pay | Admitting: Family Medicine

## 2018-08-16 DIAGNOSIS — I739 Peripheral vascular disease, unspecified: Secondary | ICD-10-CM | POA: Insufficient documentation

## 2018-08-16 MED ORDER — METOPROLOL TARTRATE 50 MG PO TABS: ORAL_TABLET | ORAL | 3 refills | Status: DC

## 2018-08-16 NOTE — Telephone Encounter (Signed)
[employer] called to give results of PAD testing on patient-- states they usually give information to nurse/ medical assistant & request he be called back at [phone].  --glh

## 2018-08-16 NOTE — Telephone Encounter (Signed)
Spoke to [place], NP through [facility]; he states that patient had PAD testing and results were mild PAD with rt  Foot at 0.98 and lt foot at 1.08.  This is an Pharmacist, hospital. MPulliam, CMA/RT(R)

## 2018-08-23 DIAGNOSIS — M9903 Segmental and somatic dysfunction of lumbar region: Secondary | ICD-10-CM | POA: Diagnosis not present

## 2018-08-23 DIAGNOSIS — M9901 Segmental and somatic dysfunction of cervical region: Secondary | ICD-10-CM | POA: Diagnosis not present

## 2018-08-23 DIAGNOSIS — M4306 Spondylolysis, lumbar region: Secondary | ICD-10-CM | POA: Diagnosis not present

## 2018-08-23 DIAGNOSIS — M9902 Segmental and somatic dysfunction of thoracic region: Secondary | ICD-10-CM | POA: Diagnosis not present

## 2018-08-23 DIAGNOSIS — M4304 Spondylolysis, thoracic region: Secondary | ICD-10-CM | POA: Diagnosis not present

## 2018-08-28 ENCOUNTER — Other Ambulatory Visit: Payer: Self-pay | Admitting: Cardiology

## 2018-09-20 DIAGNOSIS — M9901 Segmental and somatic dysfunction of cervical region: Secondary | ICD-10-CM | POA: Diagnosis not present

## 2018-09-20 DIAGNOSIS — M4723 Other spondylosis with radiculopathy, cervicothoracic region: Secondary | ICD-10-CM | POA: Diagnosis not present

## 2018-09-20 DIAGNOSIS — M9904 Segmental and somatic dysfunction of sacral region: Secondary | ICD-10-CM | POA: Diagnosis not present

## 2018-09-20 DIAGNOSIS — M9902 Segmental and somatic dysfunction of thoracic region: Secondary | ICD-10-CM | POA: Diagnosis not present

## 2018-09-20 DIAGNOSIS — M47895 Other spondylosis, thoracolumbar region: Secondary | ICD-10-CM | POA: Diagnosis not present

## 2018-09-27 ENCOUNTER — Other Ambulatory Visit: Payer: Self-pay | Admitting: Family Medicine

## 2018-09-27 DIAGNOSIS — Z1231 Encounter for screening mammogram for malignant neoplasm of breast: Secondary | ICD-10-CM

## 2018-10-01 DIAGNOSIS — M4304 Spondylolysis, thoracic region: Secondary | ICD-10-CM | POA: Diagnosis not present

## 2018-10-01 DIAGNOSIS — M9903 Segmental and somatic dysfunction of lumbar region: Secondary | ICD-10-CM | POA: Diagnosis not present

## 2018-10-01 DIAGNOSIS — M9902 Segmental and somatic dysfunction of thoracic region: Secondary | ICD-10-CM | POA: Diagnosis not present

## 2018-10-01 DIAGNOSIS — M9901 Segmental and somatic dysfunction of cervical region: Secondary | ICD-10-CM | POA: Diagnosis not present

## 2018-10-01 DIAGNOSIS — M4305 Spondylolysis, thoracolumbar region: Secondary | ICD-10-CM | POA: Diagnosis not present

## 2018-10-14 ENCOUNTER — Other Ambulatory Visit: Payer: Self-pay | Admitting: Family Medicine

## 2018-10-27 ENCOUNTER — Ambulatory Visit: Payer: Medicare Other

## 2018-11-03 DIAGNOSIS — M9903 Segmental and somatic dysfunction of lumbar region: Secondary | ICD-10-CM | POA: Diagnosis not present

## 2018-11-03 DIAGNOSIS — M4305 Spondylolysis, thoracolumbar region: Secondary | ICD-10-CM | POA: Diagnosis not present

## 2018-11-03 DIAGNOSIS — M9902 Segmental and somatic dysfunction of thoracic region: Secondary | ICD-10-CM | POA: Diagnosis not present

## 2018-11-03 DIAGNOSIS — M9901 Segmental and somatic dysfunction of cervical region: Secondary | ICD-10-CM | POA: Diagnosis not present

## 2018-11-03 DIAGNOSIS — M4304 Spondylolysis, thoracic region: Secondary | ICD-10-CM | POA: Diagnosis not present

## 2018-11-19 ENCOUNTER — Encounter: Payer: Self-pay | Admitting: Family Medicine

## 2018-11-19 ENCOUNTER — Ambulatory Visit (INDEPENDENT_AMBULATORY_CARE_PROVIDER_SITE_OTHER): Payer: Medicare Other | Admitting: Family Medicine

## 2018-11-19 VITALS — BP 138/77 | HR 70 | Temp 98.6°F | Ht 63.0 in | Wt 169.8 lb

## 2018-11-19 DIAGNOSIS — H6690 Otitis media, unspecified, unspecified ear: Secondary | ICD-10-CM | POA: Diagnosis not present

## 2018-11-19 DIAGNOSIS — R05 Cough: Secondary | ICD-10-CM

## 2018-11-19 DIAGNOSIS — J019 Acute sinusitis, unspecified: Secondary | ICD-10-CM | POA: Diagnosis not present

## 2018-11-19 DIAGNOSIS — R058 Other specified cough: Secondary | ICD-10-CM

## 2018-11-19 MED ORDER — HYDROCOD POLST-CPM POLST ER 10-8 MG/5ML PO SUER: 5.0000 mL | Freq: Two times a day (BID) | ORAL | 0 refills | Status: DC | PRN

## 2018-11-19 MED ORDER — AZITHROMYCIN 250 MG PO TABS: 250.0000 mg | ORAL_TABLET | Freq: Every day | ORAL | 0 refills | Status: DC

## 2018-11-19 MED ORDER — PREDNISONE 20 MG PO TABS: ORAL_TABLET | ORAL | 0 refills | Status: DC

## 2018-11-19 NOTE — Progress Notes (Signed)
Acute Care Office visit  Assessment and plan:  1. Acute non-recurrent sinusitis, unspecified location   2. Acute otitis media, unspecified otitis media type   3. Cough with sputum      Acute Otitis Media:   -Patient will be prescribed Zithromax as this is what she states has worked for her in the past.   Acute sinusitis:  - Viral vs Allergic vs Bacterial causes for pt's symptoms reveiwed.    - Supportive care and various OTC medications discussed in addition to any prescribed. -Due to the symptoms lasting more than 10 days (3 weeks total), I will prescribe the patient Zithromax  -Will be given steroids as well, pt notes no issues with steroids. Patient given prednisone prescription. Pt advised that prednisone may cause elevated blood sugar levels and elevated blood pressure readings.   -Patient will also be treated with tussionex to aid with cough due to post-nasal drip.  -Will be written out of work today starting at 12 PM.  -the patient was advised that if she has a fever of 100.5 or more on two separate occasions then she will not be able to go to work. Discussed that she will only be able to go to work if she is fever free on more than two separate occassions without the use of Tylenol or Ibuprofen.  - Call or RTC if new symptoms, or if no improvement or worse over next several days.      Meds ordered this encounter  Medications  . predniSONE (DELTASONE) 20 MG tablet    Sig: Take 1 tabs po BID * 3 days, then 1/2 tab BID for 2 d,  then 1/2 tab QD * 2 days.    Dispense:  15 tablet    Refill:  0  . chlorpheniramine-HYDROcodone (TUSSIONEX) 10-8 MG/5ML SUER    Sig: Take 5 mLs by mouth every 12 (twelve) hours as needed for cough (cough, will cause drowsiness.).    Dispense:  200 mL    Refill:  0  . azithromycin (ZITHROMAX) 250 MG tablet    Sig: Take 1 tablet (250 mg total) by mouth daily. Take first 2 tablets together, then 1 every day until finished.    Dispense:  6 tablet      Refill:  0    There are no discontinued medications.   Gross side effects, risk and benefits, and alternatives of medications discussed with patient.  Patient is aware that all medications have potential side effects and we are unable to predict every sideeffect or drug-drug interaction that may occur.  Expresses verbal understanding and consents to current therapy plan and treatment regiment.   Education and routine counseling performed. Handouts provided.  Anticipatory guidance and routine counseling done re: condition, txmnt options and need for follow up. All questions of patient's were answered.  Return for out of work today- can RTW on Mon. F/up chronic care as regularly schedules.  Please see AVS handed out to patient at the end of our visit for additional patient instructions/ counseling done pertaining to today's office visit.  Note:  This document was partially repared using Dragon voice recognition software and may include unintentional dictation errors.  This document serves as a record of services personally performed by Mellody Dance, DO. It was created on her behalf by Steva Colder, a trained medical scribe. The creation of this record is based on the scribe's personal observations and the provider's statements to them.   I have reviewed the above  medical documentation for accuracy and completeness and I concur.  Mellody Dance, DO 11/21/2018 9:06 PM    Subjective:    Chief Complaint  Patient presents with  . Sinus Problem    HPI:  Pt presents with Sx for 3 weeks   C/o: HA, productive cough x yesterday, resolved subjective low-grade fever, mild SOB, fatigue, right ear pain, rhinorrhea, and post-nasal drip.   Denies: CP, wheezing, one-sided facial pain, and any other symptoms.    For symptoms patient has tried:  She hasn't tried any medications at this time. However, patient notes that Zithromax works best for her.   Overall getting:   She reports that her  symptoms are worsening.    Patient Care Team    Relationship Specialty Notifications Start End  Mellody Dance, DO PCP - General Family Medicine  11/02/17   [place], Peter M, MD Consulting Physician Cardiology  11/02/17   Allyn Kenner, MD Consulting Physician Dermatology  11/02/17    Comment: see's them yrly- Dr Theda Sers is who she actually sees  Wonda Horner, MD Consulting Physician Gastroenterology  11/02/17     Past medical history, Surgical history, Family history reviewed and noted below, Social history, Allergies, and Medications have been entered into the medical record, reviewed and changed as needed.   Allergies  Allergen Reactions  . Augmentin [Amoxicillin-Pot Clavulanate] Itching and Rash    Review of Systems: - see above HPI for pertinent positives General:   No F/C, wt loss Pulm:   No DIB, pleuritic chest pain Card:  No CP, palpitations Abd:  No n/v/d or pain Ext:  No inc edema from baseline   Objective:   Blood pressure 138/77, pulse 70, temperature 98.6 F (37 C), height 5\' 3"  (1.6 m), weight 169 lb 12.8 oz (77 kg), SpO2 97 %. Body mass index is 30.08 kg/m. General: Well Developed, well nourished, appropriate for stated age.  Neuro: Alert and oriented x3, extra-ocular muscles intact, sensation grossly intact.  HEENT: Normocephalic, atraumatic, pupils equal round reactive to light, neck supple, no masses, no painful lymphadenopathy, Left TM intact, Right TM anatomy obscured due to effusion and bulge, no acute findings. Nares- patent, clear d/c, OP- clear, mild erythema, No TTP sinuses Skin: Warm and dry, no gross rash. Cardiac: RRR, S1 S2,  no murmurs rubs or gallops.  Respiratory: ECTA B/L and A/P, Not using accessory muscles, speaking in full sentences- unlabored. Vascular:  No gross lower ext edema, cap RF less 2 sec. Psych: No HI/SI, judgement and insight good, Euthymic mood. Full Affect.

## 2018-11-22 ENCOUNTER — Other Ambulatory Visit: Payer: Medicare Other

## 2018-11-22 ENCOUNTER — Ambulatory Visit
Admission: RE | Admit: 2018-11-22 | Discharge: 2018-11-22 | Disposition: A | Discharge status: Home / Self Care | Payer: Medicare Other | Source: Ambulatory Visit | Attending: Family Medicine | Admitting: Family Medicine

## 2018-11-22 DIAGNOSIS — M85851 Other specified disorders of bone density and structure, right thigh: Secondary | ICD-10-CM | POA: Diagnosis not present

## 2018-11-22 DIAGNOSIS — Z1231 Encounter for screening mammogram for malignant neoplasm of breast: Secondary | ICD-10-CM | POA: Diagnosis not present

## 2018-11-22 DIAGNOSIS — E2839 Other primary ovarian failure: Secondary | ICD-10-CM

## 2018-11-22 DIAGNOSIS — Z780 Asymptomatic menopausal state: Secondary | ICD-10-CM | POA: Diagnosis not present

## 2018-12-01 DIAGNOSIS — M4305 Spondylolysis, thoracolumbar region: Secondary | ICD-10-CM | POA: Diagnosis not present

## 2018-12-01 DIAGNOSIS — M9902 Segmental and somatic dysfunction of thoracic region: Secondary | ICD-10-CM | POA: Diagnosis not present

## 2018-12-01 DIAGNOSIS — M9901 Segmental and somatic dysfunction of cervical region: Secondary | ICD-10-CM | POA: Diagnosis not present

## 2018-12-01 DIAGNOSIS — M4304 Spondylolysis, thoracic region: Secondary | ICD-10-CM | POA: Diagnosis not present

## 2018-12-01 DIAGNOSIS — M9903 Segmental and somatic dysfunction of lumbar region: Secondary | ICD-10-CM | POA: Diagnosis not present

## 2019-01-03 DIAGNOSIS — M4305 Spondylolysis, thoracolumbar region: Secondary | ICD-10-CM | POA: Diagnosis not present

## 2019-01-03 DIAGNOSIS — M9901 Segmental and somatic dysfunction of cervical region: Secondary | ICD-10-CM | POA: Diagnosis not present

## 2019-01-03 DIAGNOSIS — M9902 Segmental and somatic dysfunction of thoracic region: Secondary | ICD-10-CM | POA: Diagnosis not present

## 2019-01-03 DIAGNOSIS — M9903 Segmental and somatic dysfunction of lumbar region: Secondary | ICD-10-CM | POA: Diagnosis not present

## 2019-01-03 DIAGNOSIS — M4304 Spondylolysis, thoracic region: Secondary | ICD-10-CM | POA: Diagnosis not present

## 2019-01-31 DIAGNOSIS — M9901 Segmental and somatic dysfunction of cervical region: Secondary | ICD-10-CM | POA: Diagnosis not present

## 2019-01-31 DIAGNOSIS — M9902 Segmental and somatic dysfunction of thoracic region: Secondary | ICD-10-CM | POA: Diagnosis not present

## 2019-01-31 DIAGNOSIS — M4304 Spondylolysis, thoracic region: Secondary | ICD-10-CM | POA: Diagnosis not present

## 2019-01-31 DIAGNOSIS — M9903 Segmental and somatic dysfunction of lumbar region: Secondary | ICD-10-CM | POA: Diagnosis not present

## 2019-01-31 DIAGNOSIS — M4305 Spondylolysis, thoracolumbar region: Secondary | ICD-10-CM | POA: Diagnosis not present

## 2019-02-03 ENCOUNTER — Telehealth: Payer: Self-pay | Admitting: Family Medicine

## 2019-02-03 NOTE — Telephone Encounter (Signed)
Patient left message that she needed to cancel 5/26 appt @ 8:45am---  --Called pt back to advise appt cancelled had to leave message on cell# voicemail.  --FYI to medical assistant..  --glh

## 2019-02-08 ENCOUNTER — Ambulatory Visit: Payer: Medicare Other | Admitting: Family Medicine

## 2019-02-18 ENCOUNTER — Other Ambulatory Visit: Payer: Self-pay | Admitting: Family Medicine

## 2019-02-18 ENCOUNTER — Other Ambulatory Visit: Payer: Self-pay | Admitting: Cardiology

## 2019-02-18 NOTE — Telephone Encounter (Signed)
Pt is a 78 yr old female who saw Dr. Martinique on 06/16/18. Last noted weight on 11/19/18 was 77kg. SCr on 08/09/18 was 1.08. Will refill Eliquis 5mg  BID.

## 2019-02-21 ENCOUNTER — Other Ambulatory Visit: Payer: Self-pay

## 2019-02-21 MED ORDER — LOSARTAN POTASSIUM 50 MG PO TABS: 50.0000 mg | ORAL_TABLET | Freq: Every day | ORAL | 0 refills | Status: DC

## 2019-02-28 ENCOUNTER — Other Ambulatory Visit: Payer: Self-pay | Admitting: Family Medicine

## 2019-02-28 ENCOUNTER — Other Ambulatory Visit: Payer: Medicare Other

## 2019-02-28 ENCOUNTER — Ambulatory Visit
Admission: RE | Admit: 2019-02-28 | Discharge: 2019-02-28 | Disposition: A | Discharge status: Home / Self Care | Payer: Medicare Other | Source: Ambulatory Visit | Attending: Family Medicine | Admitting: Family Medicine

## 2019-02-28 ENCOUNTER — Other Ambulatory Visit: Payer: Self-pay

## 2019-02-28 ENCOUNTER — Inpatient Hospital Stay: Admission: RE | Admit: 2019-02-28 | Payer: Medicare Other | Source: Ambulatory Visit

## 2019-02-28 ENCOUNTER — Ambulatory Visit: Admission: RE | Admit: 2019-02-28 | Payer: Medicare Other | Source: Ambulatory Visit

## 2019-02-28 ENCOUNTER — Ambulatory Visit (INDEPENDENT_AMBULATORY_CARE_PROVIDER_SITE_OTHER): Payer: Medicare Other | Admitting: Family Medicine

## 2019-02-28 ENCOUNTER — Telehealth: Payer: Self-pay

## 2019-02-28 ENCOUNTER — Encounter: Payer: Self-pay | Admitting: Family Medicine

## 2019-02-28 VITALS — BP 145/72 | HR 58 | Temp 98.6°F | Ht 63.0 in | Wt 172.4 lb

## 2019-02-28 DIAGNOSIS — M5431 Sciatica, right side: Secondary | ICD-10-CM | POA: Diagnosis not present

## 2019-02-28 DIAGNOSIS — Z9181 History of falling: Secondary | ICD-10-CM

## 2019-02-28 DIAGNOSIS — M7989 Other specified soft tissue disorders: Secondary | ICD-10-CM | POA: Diagnosis not present

## 2019-02-28 DIAGNOSIS — Z79899 Other long term (current) drug therapy: Secondary | ICD-10-CM

## 2019-02-28 DIAGNOSIS — M9905 Segmental and somatic dysfunction of pelvic region: Secondary | ICD-10-CM | POA: Diagnosis not present

## 2019-02-28 DIAGNOSIS — R51 Headache: Secondary | ICD-10-CM | POA: Diagnosis not present

## 2019-02-28 DIAGNOSIS — M4305 Spondylolysis, thoracolumbar region: Secondary | ICD-10-CM | POA: Diagnosis not present

## 2019-02-28 DIAGNOSIS — M25461 Effusion, right knee: Secondary | ICD-10-CM | POA: Diagnosis not present

## 2019-02-28 DIAGNOSIS — M25561 Pain in right knee: Secondary | ICD-10-CM

## 2019-02-28 DIAGNOSIS — M9901 Segmental and somatic dysfunction of cervical region: Secondary | ICD-10-CM | POA: Diagnosis not present

## 2019-02-28 DIAGNOSIS — S8991XA Unspecified injury of right lower leg, initial encounter: Secondary | ICD-10-CM | POA: Diagnosis not present

## 2019-02-28 DIAGNOSIS — S0990XA Unspecified injury of head, initial encounter: Secondary | ICD-10-CM

## 2019-02-28 DIAGNOSIS — M9903 Segmental and somatic dysfunction of lumbar region: Secondary | ICD-10-CM | POA: Diagnosis not present

## 2019-02-28 NOTE — Patient Outreach (Signed)
[facility]) Care Management  02/28/2019  Kelli [name] 11/16/1940 [id number]   Medication Adherence call to [place] Voice message left with a call back number. [name] is showing past due on Atorvastatin 10 mg under [place].   [name]

## 2019-02-28 NOTE — Telephone Encounter (Signed)
CT scan showed no acute intercranial pathology
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
## 2019-02-28 NOTE — Progress Notes (Signed)
CT scan showed no acute intercranial pathology.

## 2019-02-28 NOTE — Progress Notes (Signed)
Pt here for an acute care OV today   Impression and Recommendations:    1. Status post fall   2. Traumatic injury of head, initial encounter   3. High risk medication use   4. Acute pain of right knee   5. Swelling of right knee joint     -At this point explained to patient that head trauma causing intracranial bleed is less likely since she is 1 week out however, she is high risk and hence, we will go ahead and obtain CT head urgently. -Red flag symptoms discussed with patient and if she develops any - dial 911 -Obtain x-rays right knee -Since she was seen by Dr. Micheline Chapman in the past patient prefers to go see sports med and have extraction of fluid from the joint space at the discretion of the specialist.. Patient's last injection was August 2019.  Tyler our referral coordinator will call for appointment in the very near future -Since not giving out, patient will continue to ice 3-4 times a day for 15 to 20 minutes.  Activity as tolerated. - -High to moderate complexity; without further urgent workup and treatment planned, risk to pt's morbidity is high with possible prolonged functional impairment and poor health outcomes.    Medications Discontinued During This Encounter  Medication Reason  . azithromycin (ZITHROMAX) 250 MG tablet Error  . hydrochlorothiazide (HYDRODIURIL) 12.5 MG tablet Error  . montelukast (SINGULAIR) 10 MG tablet Error  . predniSONE (DELTASONE) 20 MG tablet Error     Orders Placed This Encounter  Procedures  . DG Knee Complete 4 Views Right     Education and routine counseling performed. Handouts provided  Gross side effects, risk and benefits, and alternatives of medications and treatment plan in general discussed with patient.  Patient is aware that all medications have potential side effects and we are unable to predict every side effect or drug-drug interaction that may occur.   Patient will call with any questions prior to using medication if they  have concerns.    Expresses verbal understanding and consents to current therapy and treatment regimen.  No barriers to understanding were identified.  Red flag symptoms and signs discussed in detail.  Patient expressed understanding regarding what to do in case of emergency\urgent symptoms   Please see AVS handed out to patient at the end of our visit for further patient instructions/ counseling done pertaining to today's office visit.   Return if symptoms worsen or fail to improve, for Patient will follow-up chronic care as previously discussed..     Note:  This document was prepared occasionally using Dragon voice recognition software and may include unintentional dictation errors in addition to a scribe.  Mellody Dance, DO  02/28/2019 1:27 PM      --------------------------------------------------------------------------------------------------------------------------------------------------------------------------------------------------------------------------------------------    Subjective:    CC:  Chief Complaint  Patient presents with  . Fall    pt states that she fell at the beach. Some facial bruising and swollen knee.    HPI: Kelli [name] is a 78 y.o. female who presents to [place] at [facility] today for issues as discussed below.  Patient was at the beach this past weekend and exactly 1 week ago she tripped over a log at the mini golf walking off the 1 hold to another.  She fell onto her right forehead and onto her right knee.  She had no loss of consciousness.  She denies headache, any visual changes, nausea vomiting or any  mental status changes.  It is been 1 week since this injury.  Patient continued her time at the beach and did activity as she normally would without difficulty.  She was walking up and down the beach and felt her normal self otherwise after the injury- she denied any symptoms at that time other than stated.  --Also she has  more concerned today over her right knee.  It is very tender to touch in the upper inner quadrant of the joint.  There is substantial swelling around the joint and ecchymosis.  Of note she did see Dr. Micheline Chapman of sports med less than a year ago.  She had injured this right knee in the past and he did provide x-rays as well as give her a ultrasound-guided shot in there which helped tremendously.  -She did seek medical attention when she was down there in [facility] and EKG, blood work, Social research officer, government. was obtained.  I do not have these medical records for my review.  She was recommended at that time to get a CAT scan of her head but however patient declined that test.  -She remains on Eliquis for her A. fib and history of non-STEMI/significant PAD etc. -Overall patient states that besides her right knee hurting her, she has no other symptoms and has been working full-time at her job at the daycare at [employer].  She lifts without difficulty/symptoms or complaints.   Wt Readings from Last 3 Encounters:  03/03/19 170 lb (77.1 kg)  02/28/19 172 lb 6.4 oz (78.2 kg)  11/19/18 169 lb 12.8 oz (77 kg)   BP Readings from Last 3 Encounters:  03/03/19 130/70  02/28/19 (!) 145/72  11/19/18 138/77   BMI Readings from Last 3 Encounters:  03/03/19 30.11 kg/m  02/28/19 30.54 kg/m  11/19/18 30.08 kg/m     Patient Care Team    Relationship Specialty Notifications Start End  Mellody Dance, DO PCP - General Family Medicine  11/02/17   Martinique, Peter M, MD Consulting Physician Cardiology  11/02/17   Allyn Kenner, MD Consulting Physician Dermatology  11/02/17    Comment: see's them yrly- Dr Theda Sers is who she actually sees  Wonda Horner, MD Consulting Physician Gastroenterology  11/02/17      Patient Active Problem List   Diagnosis Date Noted  . PAD (peripheral artery disease) ([place]) 08/16/2018    Priority: High  . NSTEMI (non-ST elevated myocardial infarction) ([place]) 03/10/2013    Priority: High  . Atrial  fibrillation with RVR ([place]) 03/09/2013    Priority: High  . Prediabetes 04/07/2018    Priority: Medium  . Hyperlipidemia 11/02/2017    Priority: Medium  . HTN (hypertension) 03/10/2014    Priority: Medium  . Postmenopausal bone loss 11/19/2017    Priority: Low  . Gastroesophageal reflux disease 11/02/2017    Priority: Low  . Vitamin D deficiency 11/02/2017    Priority: Low  . Chronic constipation 12/24/2013    Priority: Low  . Hyperkalemia 04/07/2018  . Serum potassium elevated 04/07/2018  . Right knee pain 04/07/2018  . Bursitis of right knee 04/07/2018  . Osteoarthritis of knee 04/07/2018  . Allergic rhinitis due to pollen 11/02/2017  . Environmental and seasonal allergies 11/02/2017  . Perforated diverticulitis 12/24/2013  . Fatigue 08/18/2013  . Elevated troponin 03/09/2013      Past Medical History:  Diagnosis Date  . Atrial fibrillation ([place]) 02/2013   paroxsymal; c/b Type 2 NSTEMI; CHADS2-VASc=2 (age, sex - patient had come off anti-HTN meds in past);  Eliquis started;  Echocardiogram 03/10/13: EF 81-01%, grade 1 diastolic dysfunction, mild MR, mild LAE  . Hyperlipidemia   . Hypertension   . NSTEMI (non-ST elevated myocardial infarction) ([place]) 02/2013   demand ischemia in setting of AFib with RVR; ETT-Myoview 03/11/13: No ischemia or scar, EF 70%.     Past Surgical History:  Procedure Laterality Date  . APPENDECTOMY    . CATARACT EXTRACTION, BILATERAL    . EYE SURGERY    . TONSILLECTOMY       Family History  Problem Relation Age of Onset  . Heart attack Mother 26  . Cancer Maternal Aunt        throat     Social History   Socioeconomic History  . Marital status: Widowed    Spouse name: Not on file  . Number of children: 3  . Years of education: Not on file  . Highest education level: Not on file  Occupational History  . Occupation: Surveyor, quantity- Day care    Employer: [employer]  . Financial resource strain: Not on  file  . Food insecurity    Worry: Not on file    Inability: Not on file  . Transportation needs    Medical: Not on file    Non-medical: Not on file  Tobacco Use  . Smoking status: Never Smoker  . Smokeless tobacco: Never Used  Substance and Sexual Activity  . Alcohol use: Yes    Comment: 1-2 glasses of wine occ.  . Drug use: No  . Sexual activity: Not Currently  Lifestyle  . Physical activity    Days per week: Not on file    Minutes per session: Not on file  . Stress: Not on file  Relationships  . Social Herbalist on phone: Not on file    Gets together: Not on file    Attends religious service: Not on file    Active member of club or organization: Not on file    Attends meetings of clubs or organizations: Not on file    Relationship status: Not on file  . Intimate partner violence    Fear of current or ex partner: Not on file    Emotionally abused: Not on file    Physically abused: Not on file    Forced sexual activity: Not on file  Other Topics Concern  . Not on file  Social History Narrative  . Not on file     Current Meds  Medication Sig  . acetaminophen (TYLENOL) 500 MG tablet Take 500 mg by mouth daily as needed for pain.  [name] atorvastatin (LIPITOR) 10 MG tablet Take 5 mg by mouth daily.   . Calcium Carb-Cholecalciferol (CALCIUM + D3) 600-200 MG-UNIT TABS Take 1 tablet by mouth daily.  . chlorpheniramine-HYDROcodone (TUSSIONEX) 10-8 MG/5ML SUER Take 5 mLs by mouth every 12 (twelve) hours as needed for cough (cough, will cause drowsiness.).  [name] Cholecalciferol (VITAMIN D) 400 UNITS capsule Take 400 Units by mouth daily.  [name] Calcium (STOOL SOFTENER PO) Take by mouth at bedtime.  [name] ELIQUIS 5 MG TABS tablet TAKE 1 TABLET BY MOUTH 2 TIMES DAILY.  [name] FIBER ADULT GUMMIES PO Take 1 Dose by mouth daily.  . fish oil-omega-3 fatty acids 1000 MG capsule Take 1 g by mouth daily.  . Flaxseed, Linseed, (FLAX SEED OIL PO) Take by mouth daily.  . fluticasone  (FLONASE) 50 MCG/ACT nasal spray Place 1 spray into both nostrils daily  as needed.   [name] losartan (COZAAR) 50 MG tablet Take 1 tablet (50 mg total) by mouth daily.  . Magnesium 300 MG CAPS Take 1 capsule by mouth daily.  . metoprolol tartrate (LOPRESSOR) 50 MG tablet Take 1 tablet ( 50 mg ) in morning and take 1/2 tablet ( 25 mg ) in afternoon  . Multiple Vitamin (MULTIVITAMIN) capsule Take 1 capsule by mouth daily.  . Probiotic Product (PROBIOTIC DAILY PO) Take 1 tablet by mouth daily.  . vitamin C (ASCORBIC ACID) 500 MG tablet Take 500 mg by mouth daily.    Allergies:  Allergies  Allergen Reactions  . Augmentin [Amoxicillin-Pot Clavulanate] Itching and Rash     Review of Systems: General:   Denies fever, chills, unexplained weight loss.  Optho/Auditory:   Denies visual changes, blurred vision/LOV Respiratory:   Denies wheeze, DOE more than baseline levels.   Cardiovascular:   Denies chest pain, palpitations, new onset peripheral edema  Gastrointestinal:   Denies nausea, vomiting, diarrhea, abd pain.  Genitourinary: Denies dysuria, freq/ urgency, flank pain or discharge from genitals.  Endocrine:     Denies hot or cold intolerance, polyuria, polydipsia. Musculoskeletal:   Denies unexplained myalgias, joint swelling, unexplained arthralgias, gait problems.  Skin:  Denies new onset rash, suspicious lesions Neurological:     Denies dizziness, unexplained weakness, numbness  Psychiatric/Behavioral:   Denies mood changes, suicidal or homicidal ideations, hallucinations    Objective:   Blood pressure (!) 145/72, pulse (!) 58, temperature 98.6 F (37 C), height 5\' 3"  (1.6 m), weight 172 lb 6.4 oz (78.2 kg), SpO2 98 %. Body mass index is 30.54 kg/m. General:  Well Developed, well nourished, appropriate for stated age.  Neuro:  Alert and oriented,  extra-ocular muscles intact  HEENT:  Normocephalic, atraumatic, neck supple, raccoon eyes present ecchymosis bilaterally.  Forehead contusion  on the right with surrounding abrasion and ecchymosis.  Bony aspects are nontender to palpation.  No blood in the ear canals, nasal passages, PERRLA, EOMI, neuro exam- appears intact Skin:  no gross rash, warm, pink. Cardiac:  RRR, S1 S2 Respiratory:  ECTA B/L and A/P, Not using accessory muscles, speaking in full sentences- unlabored. M-Sk: Patient with large ecchymosis and effusion with abrasion to anterior right knee.  Anterior drawer, valgus varus strain etc. all within normal limits.  Limited exam due to patient's discomfort. Vascular:  Ext warm, no cyanosis apprec.; cap RF less 2 sec. no calf tenderness to palpation. Psych:  No HI/SI, judgement and insight good, Euthymic mood. Full Affect.

## 2019-03-01 NOTE — Telephone Encounter (Signed)
Guys I told CMA to call the patient and let them know there was no acute intracranial pathology on the CT scan but I do not see where that was done.  Please call and let patient know.  Continue vigilance and watch out for red flag symptoms as we discussed last OV.  Change meds as prescribed without change  -Also let patient know that x-rays were negative of her knee for fracture.  Also did not show any significant joint effusion.  It did however show a lot of swelling that appeared to be on the outside of her knee which is great.  However, I still think it would be good for patient to go to her sports med doctor about this since she has needed a shot in the past and, due to her acute pain, could possibly benefit from that again, plus or minus jt aspiration prior.

## 2019-03-01 NOTE — Telephone Encounter (Signed)
Called and left message for patient to call the office. MPulliam, CMA/RT(R)  

## 2019-03-02 NOTE — Telephone Encounter (Signed)
Called and left message for the patient to call the office. MPulliam, CMA/RT(R)  

## 2019-03-02 NOTE — Telephone Encounter (Signed)
Patient notified. MPulliam, CMA/RT(R)  

## 2019-03-03 ENCOUNTER — Ambulatory Visit: Payer: Self-pay

## 2019-03-03 ENCOUNTER — Encounter: Payer: Self-pay | Admitting: Sports Medicine

## 2019-03-03 ENCOUNTER — Ambulatory Visit (INDEPENDENT_AMBULATORY_CARE_PROVIDER_SITE_OTHER): Payer: Medicare Other | Admitting: Sports Medicine

## 2019-03-03 ENCOUNTER — Other Ambulatory Visit: Payer: Self-pay

## 2019-03-03 VITALS — BP 130/70 | Ht 63.0 in | Wt 170.0 lb

## 2019-03-03 DIAGNOSIS — M25561 Pain in right knee: Secondary | ICD-10-CM | POA: Diagnosis not present

## 2019-03-03 DIAGNOSIS — M7041 Prepatellar bursitis, right knee: Secondary | ICD-10-CM

## 2019-03-03 NOTE — Progress Notes (Signed)
Kelli [name] - 78 y.o. female MRN [id number]  Date of birth: [birth year]    SUBJECTIVE:      Chief Complaint: Knee Pain  HPI:  78 year old female presents with right knee pain for 1.5 weeks.  Patient reports having a fall.  She was playing mini golf at the beach.  She had just hit a hole in 1 and was celebrating, tripped, and fell forward.  She fell on her anterior right knee and her head.  She was cleared at that time by local EMS.  Since then she has followed up with her PCP who performed a CT head to rule out a bleed and an x-ray of the right knee.  Overall she reports diffuse pain/soreness.  It is tender to palpation.  She is able to walk without difficulty and in fact was able to enjoy the rest of her vacation.  She does report swelling over the front of her knee.  No erythema, however initially did feel very warm to her.  She denies any locking or giving out.  She notes an abrasion and bruising as well over the knee.   ROS:     See HPI. All other reviewed systems negative.  PERTINENT  PMH / PSH FH / / SH:  Past Medical, Surgical, Social, and Family History Reviewed & Updated in the EMR.  Pertinent findings include:  Patient currently on anticoagulation with Eliquis  OBJECTIVE: BP 130/70   Ht 5\' 3"  (1.6 m)   Wt 170 lb (77.1 kg)   BMI 30.11 kg/m   Physical Exam:  Vital signs are reviewed.  GEN: Alert and oriented, NAD Pulm: Breathing unlabored PSY: normal mood, congruent affect  MSK: Right knee: - Inspection: No gross deformity.  Moderate size prepatellar swelling.  No obvious effusion.  There is a healing abrasion over the inferior pole patella.  No obvious erythema - Palpation: Diffuse tenderness, mostly over the medial aspect of the knee - ROM: full active ROM with flexion and extension in knee - Strength: 5/5 strength - Neuro/vasc: NV intact - Special Tests: - LIGAMENTS: negative Lachman's, no MCL or LCL laxity  -- MENISCUS: negative McMurray's   Left knee: No  swelling or bruising No tenderness Full range of motion 5/5 strength N/V intact distally   ASSESSMENT & PLAN:  1.  Prepatellar bursitis and right knee pain secondary to fall-patient's swelling is extra-articular, in the prepatellar bursa.  Her x-rays were independently reviewed today and show no acute or structural abnormalities aside from some degenerative changes. - Bursal aspiration performed today -Patient's knee was wrapped with Ace wrap for compression - Expect gradual recovery over the next days to weeks.  If she is struggling she will return.    Procedure performed: Prepatellar bursa aspiration; palpation guided Consent obtained and verified. Time-out conducted. Noted no overlying erythema, induration, or other signs of local infection. The  right prepatellar bursa was palpated and marked. The overlying skin was prepped in a sterile fashion. Topical analgesic spray: Ethyl chloride. Joint: Right knee prepatellar bursa Needle: 18ga, 1.5" 25 cc of bloody serosanguineous fluid aspirated.  Completed without difficulty. Meds: Lidocaine 1 cc for local anesthetic  Patient seen and evaluated with the sports medicine fellow.  I agree with the above plan of care.  X-rays are reviewed and that showed no evidence of fracture.  Patient's prepatellar bursa was aspirated today under sterile technique.  Total of 25 cc of bloody serosanguineous fluid was aspirated.  Compression wrap was applied.  She may continue  with activity as tolerated and will follow-up for ongoing or recalcitrant issues.

## 2019-03-11 ENCOUNTER — Ambulatory Visit (INDEPENDENT_AMBULATORY_CARE_PROVIDER_SITE_OTHER): Payer: Medicare Other | Admitting: Family Medicine

## 2019-03-11 ENCOUNTER — Other Ambulatory Visit: Payer: Self-pay

## 2019-03-11 VITALS — BP 138/64 | Ht 63.0 in | Wt 170.0 lb

## 2019-03-11 DIAGNOSIS — M7989 Other specified soft tissue disorders: Secondary | ICD-10-CM | POA: Diagnosis not present

## 2019-03-11 DIAGNOSIS — M79671 Pain in right foot: Secondary | ICD-10-CM

## 2019-03-11 NOTE — Patient Instructions (Signed)
Your ankle and lower leg swelling is due to disruption of lymphatic vessels from your fall along with fluid seeping out of this drained prepatellar bursa. These are not dangerous but can take weeks to resolve. Icing 15 minutes at a time 3-4 times a day. Elevate above the level of your heart as much as possible. Activities as tolerated though without restrictions. Compression is important to keep this down as well. Follow up with me in 4 weeks or as needed if you're doing well.

## 2019-03-14 ENCOUNTER — Encounter: Payer: Self-pay | Admitting: Family Medicine

## 2019-03-14 NOTE — Progress Notes (Signed)
PCP: Mellody Dance, DO  Subjective:   HPI: Patient is a 78 y.o. female here for right leg swelling.  67/58: 78 year old female presents with right knee pain for 1.5 weeks.  Patient reports having a fall.  She was playing mini golf at the beach.  She had just hit a hole in 1 and was celebrating, tripped, and fell forward.  She fell on her anterior right knee and her head.  She was cleared at that time by local EMS.  Since then she has followed up with her PCP who performed a CT head to rule out a bleed and an x-ray of the right knee.  Overall she reports diffuse pain/soreness.  It is tender to palpation.  She is able to walk without difficulty and in fact was able to enjoy the rest of her vacation.  She does report swelling over the front of her knee.  No erythema, however initially did feel very warm to her.  She denies any locking or giving out.  She notes an abrasion and bruising as well over the knee.  6/26: Patient returns reporting increased swelling since last visit where she had prepatellar bursa drained. Some discomfort she describes as more of a tenderness. Bothers her more when she's up and walking around. Has been icing, elevating, using ACE wrap. No new injury or trauma. No redness, other skin changes.  Past Medical History:  Diagnosis Date  . Atrial fibrillation ([place]) 02/2013   paroxsymal; c/b Type 2 NSTEMI; CHADS2-VASc=2 (age, sex - patient had come off anti-HTN meds in past); Eliquis started;  Echocardiogram 03/10/13: EF 78-29%, grade 1 diastolic dysfunction, mild MR, mild LAE  . Hyperlipidemia   . Hypertension   . NSTEMI (non-ST elevated myocardial infarction) ([place]) 02/2013   demand ischemia in setting of AFib with RVR; ETT-Myoview 03/11/13: No ischemia or scar, EF 70%.    Current Outpatient Medications on File Prior to Visit  Medication Sig Dispense Refill  . acetaminophen (TYLENOL) 500 MG tablet Take 500 mg by mouth daily as needed for pain.    [name] atorvastatin (LIPITOR)  10 MG tablet Take 5 mg by mouth daily.     . Calcium Carb-Cholecalciferol (CALCIUM + D3) 600-200 MG-UNIT TABS Take 1 tablet by mouth daily.    . chlorpheniramine-HYDROcodone (TUSSIONEX) 10-8 MG/5ML SUER Take 5 mLs by mouth every 12 (twelve) hours as needed for cough (cough, will cause drowsiness.). 200 mL 0  . Cholecalciferol (VITAMIN D) 400 UNITS capsule Take 400 Units by mouth daily.    [name] Calcium (STOOL SOFTENER PO) Take by mouth at bedtime.    [name] ELIQUIS 5 MG TABS tablet TAKE 1 TABLET BY MOUTH 2 TIMES DAILY. 180 tablet 1  . FIBER ADULT GUMMIES PO Take 1 Dose by mouth daily.    . fish oil-omega-3 fatty acids 1000 MG capsule Take 1 g by mouth daily.    . Flaxseed, Linseed, (FLAX SEED OIL PO) Take by mouth daily.    . fluticasone (FLONASE) 50 MCG/ACT nasal spray Place 1 spray into both nostrils daily as needed.     [name] losartan (COZAAR) 50 MG tablet Take 1 tablet (50 mg total) by mouth daily. 90 tablet 0  . Magnesium 300 MG CAPS Take 1 capsule by mouth daily.    . metoprolol tartrate (LOPRESSOR) 50 MG tablet Take 1 tablet ( 50 mg ) in morning and take 1/2 tablet ( 25 mg ) in afternoon 180 tablet 3  . Multiple Vitamin (MULTIVITAMIN) capsule Take 1 capsule  by mouth daily.    . Probiotic Product (PROBIOTIC DAILY PO) Take 1 tablet by mouth daily.    . vitamin C (ASCORBIC ACID) 500 MG tablet Take 500 mg by mouth daily.     No current facility-administered medications on file prior to visit.     Past Surgical History:  Procedure Laterality Date  . APPENDECTOMY    . CATARACT EXTRACTION, BILATERAL    . EYE SURGERY    . TONSILLECTOMY      Allergies  Allergen Reactions  . Augmentin [Amoxicillin-Pot Clavulanate] Itching and Rash    Social History   Socioeconomic History  . Marital status: Widowed    Spouse name: Not on file  . Number of children: 3  . Years of education: Not on file  . Highest education level: Not on file  Occupational History  . Occupation: Surveyor, quantity-  Day care    Employer: [employer]  . Financial resource strain: Not on file  . Food insecurity    Worry: Not on file    Inability: Not on file  . Transportation needs    Medical: Not on file    Non-medical: Not on file  Tobacco Use  . Smoking status: Never Smoker  . Smokeless tobacco: Never Used  Substance and Sexual Activity  . Alcohol use: Yes    Comment: 1-2 glasses of wine occ.  . Drug use: No  . Sexual activity: Not Currently  Lifestyle  . Physical activity    Days per week: Not on file    Minutes per session: Not on file  . Stress: Not on file  Relationships  . Social Herbalist on phone: Not on file    Gets together: Not on file    Attends religious service: Not on file    Active member of club or organization: Not on file    Attends meetings of clubs or organizations: Not on file    Relationship status: Not on file  . Intimate partner violence    Fear of current or ex partner: Not on file    Emotionally abused: Not on file    Physically abused: Not on file    Forced sexual activity: Not on file  Other Topics Concern  . Not on file  Social History Narrative  . Not on file    Family History  Problem Relation Age of Onset  . Heart attack Mother 27  . Cancer Maternal Aunt        throat    BP 138/64   Ht 5\' 3"  (1.6 m)   Wt 170 lb (77.1 kg)   BMI 30.11 kg/m   Review of Systems: See HPI above.     Objective:  Physical Exam:  Gen: NAD, comfortable in exam room  Right knee: No gross deformity, ecchymoses.  Trace swelling of prepatllar bursa. No TTP. FROM with 5/5 strength flexion and extension. Negative ant/post drawers. Negative valgus/varus testing. Negative lachmans. Negative mcmurrays, apleys. NV intact distally.  Right ankle/lower leg: 1+ pitting edema of shin.  No other gross deformity, ecchymoses FROM with 5/5 strength all directions without pain. TTP mildly throughout lower leg. Negative ant drawer  and talar tilt. Negative syndesmotic compression. Thompsons test negative. NV intact distally.  MSK u/s:  Mild soft tissue swelling of right lower leg.  Venous structures compressible.  Assessment & Plan:  1. Right lower leg pain/swelling - reassured patient.  Consistent with dependent edema following aspiration of prepatellar  bursa along with disruption of lymphatic vessels from her fall.  Icing, elevation, compression.  F/u in 4 weeks or prn if she's doing well.

## 2019-03-21 DIAGNOSIS — M9905 Segmental and somatic dysfunction of pelvic region: Secondary | ICD-10-CM | POA: Diagnosis not present

## 2019-03-21 DIAGNOSIS — M9901 Segmental and somatic dysfunction of cervical region: Secondary | ICD-10-CM | POA: Diagnosis not present

## 2019-03-21 DIAGNOSIS — M4305 Spondylolysis, thoracolumbar region: Secondary | ICD-10-CM | POA: Diagnosis not present

## 2019-03-21 DIAGNOSIS — M5431 Sciatica, right side: Secondary | ICD-10-CM | POA: Diagnosis not present

## 2019-03-21 DIAGNOSIS — M9903 Segmental and somatic dysfunction of lumbar region: Secondary | ICD-10-CM | POA: Diagnosis not present

## 2019-04-11 ENCOUNTER — Ambulatory Visit: Payer: Medicare Other | Admitting: Family Medicine

## 2019-04-12 ENCOUNTER — Ambulatory Visit: Payer: Medicare Other | Admitting: Family Medicine

## 2019-04-25 DIAGNOSIS — M9905 Segmental and somatic dysfunction of pelvic region: Secondary | ICD-10-CM | POA: Diagnosis not present

## 2019-04-25 DIAGNOSIS — M9903 Segmental and somatic dysfunction of lumbar region: Secondary | ICD-10-CM | POA: Diagnosis not present

## 2019-04-25 DIAGNOSIS — M4305 Spondylolysis, thoracolumbar region: Secondary | ICD-10-CM | POA: Diagnosis not present

## 2019-04-25 DIAGNOSIS — M5431 Sciatica, right side: Secondary | ICD-10-CM | POA: Diagnosis not present

## 2019-04-25 DIAGNOSIS — M9902 Segmental and somatic dysfunction of thoracic region: Secondary | ICD-10-CM | POA: Diagnosis not present

## 2019-04-26 ENCOUNTER — Other Ambulatory Visit: Payer: Self-pay

## 2019-04-26 NOTE — Patient Outreach (Signed)
[facility]) Care Management  04/26/2019  Kelli [name] Feb 08, 1941 [id number]   Medication Adherence call to Kelli [name] Compliant Voice message left with a call back number. Kelli [name] is showing past due on Atorvastatin 10 mg under [place].   [facility] Direct Dial [phone]  Fax [phone] [email or web address]

## 2019-05-16 ENCOUNTER — Ambulatory Visit: Payer: Medicare Other | Admitting: Family Medicine

## 2019-05-30 DIAGNOSIS — M5431 Sciatica, right side: Secondary | ICD-10-CM | POA: Diagnosis not present

## 2019-05-30 DIAGNOSIS — M4305 Spondylolysis, thoracolumbar region: Secondary | ICD-10-CM | POA: Diagnosis not present

## 2019-05-30 DIAGNOSIS — M9903 Segmental and somatic dysfunction of lumbar region: Secondary | ICD-10-CM | POA: Diagnosis not present

## 2019-05-30 DIAGNOSIS — M9902 Segmental and somatic dysfunction of thoracic region: Secondary | ICD-10-CM | POA: Diagnosis not present

## 2019-05-30 DIAGNOSIS — M9905 Segmental and somatic dysfunction of pelvic region: Secondary | ICD-10-CM | POA: Diagnosis not present

## 2019-05-31 ENCOUNTER — Telehealth: Payer: Self-pay | Admitting: Family Medicine

## 2019-05-31 NOTE — Telephone Encounter (Signed)
Pt's original appt was a CPE w/ FBW but was changed to a medicare wellness exam 06/15/19-- Are labs still going to be drawn, per patient NO Labs drawn in almost a year.   --Forwarding message to medical assistant for review w/provider for Lab orders ? --glh

## 2019-05-31 NOTE — Telephone Encounter (Signed)
Per provider the blood work will be done the same day.

## 2019-06-09 NOTE — Progress Notes (Signed)
Chinmayi C Fellner Date of Birth: September 25, 1940 Medical Record [id number]  History of Present Illness: Ms. Granato is seen for follow up of atrial fibrillation.  She has a history of HTN, paroxysmal Afib with RVR. Admitted in 2014. Had demand ischemia. Myoview study was normal.  Echo from [place] of 2014 with EF of 55 to 60% and grade 1 diastolic dysfunction. She has been on chronic therapy with metoprolol and Eliquis.  On follow up today she is doing well from a cardiac standpoint. She denies any chest pain, SOB. Notes a rare flutter that doesn't last long.    BP at home has been excellent. 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   Current Outpatient Medications  Medication Sig Dispense Refill  . acetaminophen (TYLENOL) 500 MG tablet Take 500 mg by mouth daily as needed for pain.    [name] atorvastatin (LIPITOR) 10 MG tablet Take 5 mg by mouth daily.     . Calcium Carb-Cholecalciferol (CALCIUM + D3) 600-200 MG-UNIT TABS Take 1 tablet by mouth daily.    . Cholecalciferol (VITAMIN D) 400 UNITS capsule Take 400 Units by mouth daily.    [name] Calcium (STOOL SOFTENER PO) Take by mouth at bedtime.    [name] ELIQUIS 5 MG TABS tablet TAKE 1 TABLET BY MOUTH 2 TIMES DAILY. 180 tablet 1  . FIBER ADULT GUMMIES PO Take 1 Dose by mouth daily.    . fish oil-omega-3 fatty acids 1000 MG capsule Take 1 g by mouth daily.    . Flaxseed, Linseed, (FLAX SEED OIL PO) Take by mouth daily.    . fluticasone (FLONASE) 50 MCG/ACT nasal spray Place 1 spray into both nostrils daily as needed.     [name] losartan (COZAAR) 50 MG tablet Take 1 tablet (50 mg total) by mouth daily. (Patient taking differently: Take 25 mg by mouth daily. ) 90 tablet 0  . Magnesium 300 MG CAPS Take 1 capsule by mouth daily.    . metoprolol tartrate (LOPRESSOR) 50 MG tablet Take 1 tablet ( 50 mg ) in morning and take 1/2 tablet ( 25 mg ) in afternoon 180 tablet 3  . Multiple Vitamin (MULTIVITAMIN) capsule Take 1 capsule by mouth daily.    . Probiotic Product  (PROBIOTIC DAILY PO) Take 1 tablet by mouth daily.    . vitamin C (ASCORBIC ACID) 500 MG tablet Take 500 mg by mouth daily.    . chlorpheniramine-HYDROcodone (TUSSIONEX) 10-8 MG/5ML SUER Take 5 mLs by mouth every 12 (twelve) hours as needed for cough (cough, will cause drowsiness.). (Patient not taking: Reported on 06/13/2019) 200 mL 0   No current facility-administered medications for this visit.     Allergies  Allergen Reactions  . Augmentin [Amoxicillin-Pot Clavulanate] Itching and Rash    Past Medical History:  Diagnosis Date  . Atrial fibrillation ([place]) 02/2013   paroxsymal; c/b Type 2 NSTEMI; CHADS2-VASc=2 (age, sex - patient had come off anti-HTN meds in past); Eliquis started;  Echocardiogram 03/10/13: EF 0000000, grade 1 diastolic dysfunction, mild MR, mild LAE  . Hyperlipidemia   . Hypertension   . NSTEMI (non-ST elevated myocardial infarction) ([place]) 02/2013   demand ischemia in setting of AFib with RVR; ETT-Myoview 03/11/13: No ischemia or scar, EF 70%.    Past Surgical History:  Procedure Laterality Date  . APPENDECTOMY    . CATARACT EXTRACTION, BILATERAL    . EYE SURGERY    . TONSILLECTOMY      Social History   Tobacco Use  Smoking  Status Never Smoker  Smokeless Tobacco Never Used    Social History   Substance and Sexual Activity  Alcohol Use Yes   Comment: 1-2 glasses of wine occ.    Family History  Problem Relation Age of Onset  . Heart attack Mother 59  . Cancer Maternal Aunt        throat    Review of Systems: The review of systems is per the HPI.  All other systems were reviewed and are negative.  Physical Exam: BP (!) 148/76   Pulse 68   Temp (!) 96.8 F (36 C)   Ht 5\' 3"  (1.6 m)   Wt 166 lb (75.3 kg)   SpO2 97%   BMI 29.41 kg/m  GENERAL:  Well appearing WF in NAD HEENT:  PERRL, EOMI, sclera are clear. Oropharynx is clear. NECK:  No jugular venous distention, carotid upstroke brisk and symmetric, no bruits, no thyromegaly or adenopathy  LUNGS:  Clear to auscultation bilaterally CHEST:  Unremarkable HEART:  RRR,  PMI not displaced or sustained,S1 and S2 within normal limits, no S3, no S4: no clicks, no rubs, no murmurs ABD:  Soft, nontender. BS +, no masses or bruits. No hepatomegaly, no splenomegaly EXT:  2 + pulses throughout, no edema, no cyanosis no clubbing SKIN:  Warm and dry.  No rashes NEURO:  Alert and oriented x 3. Cranial nerves II through XII intact. PSYCH:  Cognitively intact      LABORATORY DATA:  Lab Results  Component Value Date   WBC 9.3 11/19/2017   HGB 13.3 11/19/2017   HCT 40.0 11/19/2017   PLT 220 11/19/2017   GLUCOSE 85 08/09/2018   CHOL 169 11/19/2017   TRIG 108 11/19/2017   HDL 50 11/19/2017   LDLCALC 97 11/19/2017   ALT 29 11/19/2017   AST 31 11/19/2017   NA 141 08/09/2018   K 4.4 08/09/2018   CL 101 08/09/2018   CREATININE 1.08 (H) 08/09/2018   BUN 21 08/09/2018   CO2 26 08/09/2018   TSH 1.020 11/19/2017   INR 1.19 01/03/2014   HGBA1C 5.7 (A) 08/09/2018   Labs dated 12/09/16: cholesterol 180, triglycerides 93, HDL 48, LDL 113. CMET normal.  Ecg today shows NSR with normal Ecg. Rate 62. I have personally reviewed and interpreted this study.  Assessment / Plan: 1.  PAF - Maintaining NSR. Continue metoprolol and Eliquis.   2.  Chronic anticoagulation- tolerating well.  3. HTN - BP is well controlled.  I will follow up in one year

## 2019-06-13 ENCOUNTER — Encounter: Payer: Self-pay | Admitting: Cardiology

## 2019-06-13 ENCOUNTER — Ambulatory Visit (INDEPENDENT_AMBULATORY_CARE_PROVIDER_SITE_OTHER): Payer: Medicare Other | Admitting: Cardiology

## 2019-06-13 ENCOUNTER — Other Ambulatory Visit: Payer: Self-pay

## 2019-06-13 VITALS — BP 148/76 | HR 68 | Temp 96.8°F | Ht 63.0 in | Wt 166.0 lb

## 2019-06-13 DIAGNOSIS — I10 Essential (primary) hypertension: Secondary | ICD-10-CM

## 2019-06-13 DIAGNOSIS — I480 Paroxysmal atrial fibrillation: Secondary | ICD-10-CM

## 2019-06-15 ENCOUNTER — Encounter: Payer: Self-pay | Admitting: Family Medicine

## 2019-06-15 ENCOUNTER — Ambulatory Visit (INDEPENDENT_AMBULATORY_CARE_PROVIDER_SITE_OTHER): Payer: Medicare Other | Admitting: Family Medicine

## 2019-06-15 ENCOUNTER — Other Ambulatory Visit: Payer: Self-pay

## 2019-06-15 VITALS — BP 118/69 | HR 57 | Ht 61.75 in | Wt 165.0 lb

## 2019-06-15 DIAGNOSIS — M47895 Other spondylosis, thoracolumbar region: Secondary | ICD-10-CM | POA: Diagnosis not present

## 2019-06-15 DIAGNOSIS — Z0000 Encounter for general adult medical examination without abnormal findings: Secondary | ICD-10-CM | POA: Diagnosis not present

## 2019-06-15 DIAGNOSIS — M9902 Segmental and somatic dysfunction of thoracic region: Secondary | ICD-10-CM | POA: Diagnosis not present

## 2019-06-15 DIAGNOSIS — R7309 Other abnormal glucose: Secondary | ICD-10-CM

## 2019-06-15 DIAGNOSIS — Z79899 Other long term (current) drug therapy: Secondary | ICD-10-CM | POA: Diagnosis not present

## 2019-06-15 DIAGNOSIS — R7303 Prediabetes: Secondary | ICD-10-CM

## 2019-06-15 DIAGNOSIS — M8580 Other specified disorders of bone density and structure, unspecified site: Secondary | ICD-10-CM | POA: Diagnosis not present

## 2019-06-15 DIAGNOSIS — E785 Hyperlipidemia, unspecified: Secondary | ICD-10-CM

## 2019-06-15 DIAGNOSIS — E782 Mixed hyperlipidemia: Secondary | ICD-10-CM

## 2019-06-15 DIAGNOSIS — M9903 Segmental and somatic dysfunction of lumbar region: Secondary | ICD-10-CM | POA: Diagnosis not present

## 2019-06-15 DIAGNOSIS — E559 Vitamin D deficiency, unspecified: Secondary | ICD-10-CM

## 2019-06-15 DIAGNOSIS — I10 Essential (primary) hypertension: Secondary | ICD-10-CM

## 2019-06-15 DIAGNOSIS — M47897 Other spondylosis, lumbosacral region: Secondary | ICD-10-CM | POA: Diagnosis not present

## 2019-06-15 DIAGNOSIS — M9904 Segmental and somatic dysfunction of sacral region: Secondary | ICD-10-CM | POA: Diagnosis not present

## 2019-06-15 NOTE — Patient Instructions (Addendum)
Please make sure you are taking at least 1200 of calcium per day  Please call your last PCPs office and ask if you had zoster vaccine or Shingrix vaccines and ask what the dates were of those administrations.  Please try to avoid all dairy, fatty foods and fried foods until your bowels or sugar are completely back to normal for 2 to 3 days then slowly add them back.     Preventive Care for Adults, Female  A healthy lifestyle and preventive care can promote health and wellness. Preventive health guidelines for women include the following key practices.   A routine yearly physical is a good way to check with your health care provider about your health and preventive screening. It is a chance to share any concerns and updates on your health and to receive a thorough exam.   Visit your dentist for a routine exam and preventive care every 6 months. Brush your teeth twice a day and floss once a day. Good oral hygiene prevents tooth decay and gum disease.   The frequency of eye exams is based on your age, health, family medical history, use of contact lenses, and other factors. Follow your health care provider's recommendations for frequency of eye exams.   Eat a healthy diet. Foods like vegetables, fruits, whole grains, low-fat dairy products, and lean protein foods contain the nutrients you need without too many calories. Decrease your intake of foods high in solid fats, added sugars, and salt. Eat the right amount of calories for you.Get information about a proper diet from your health care provider, if necessary.   Regular physical exercise is one of the most important things you can do for your health. Most adults should get at least 150 minutes of moderate-intensity exercise (any activity that increases your heart rate and causes you to sweat) each week. In addition, most adults need muscle-strengthening exercises on 2 or more days a week.   Maintain a healthy weight. The body mass index  (BMI) is a screening tool to identify possible weight problems. It provides an estimate of body fat based on height and weight. Your health care provider can find your BMI, and can help you achieve or maintain a healthy weight.For adults 20 years and older:   - A BMI below 18.5 is considered underweight.   - A BMI of 18.5 to 24.9 is normal.   - A BMI of 25 to 29.9 is considered overweight.   - A BMI of 30 and above is considered obese.   Maintain normal blood lipids and cholesterol levels by exercising and minimizing your intake of trans and saturated fats.  Eat a balanced diet with plenty of fruit and vegetables. Blood tests for lipids and cholesterol should begin at age 73 and be repeated every 5 years minimum.  If your lipid or cholesterol levels are high, you are over 40, or you are at high risk for heart disease, you may need your cholesterol levels checked more frequently.Ongoing high lipid and cholesterol levels should be treated with medicines if diet and exercise are not working.   If you smoke, find out from your health care provider how to quit. If you do not use tobacco, do not start.   Lung cancer screening is recommended for adults aged 30-80 years who are at high risk for developing lung cancer because of a history of smoking. A yearly low-dose CT scan of the lungs is recommended for people who have at least a 30-pack-year history of  smoking and are a current smoker or have quit within the past 15 years. A pack year of smoking is smoking an average of 1 pack of cigarettes a day for 1 year (for example: 1 pack a day for 30 years or 2 packs a day for 15 years). Yearly screening should continue until the smoker has stopped smoking for at least 15 years. Yearly screening should be stopped for people who develop a health problem that would prevent them from having lung cancer treatment.   If you are pregnant, do not drink alcohol. If you are breastfeeding, be very cautious about drinking  alcohol. If you are not pregnant and choose to drink alcohol, do not have more than 1 drink per day. One drink is considered to be 12 ounces (355 mL) of beer, 5 ounces (148 mL) of wine, or 1.5 ounces (44 mL) of liquor.   Avoid use of street drugs. Do not share needles with anyone. Ask for help if you need support or instructions about stopping the use of drugs.   High blood pressure causes heart disease and increases the risk of stroke. Your blood pressure should be checked at least yearly.  Ongoing high blood pressure should be treated with medicines if weight loss and exercise do not work.   If you are 37-37 years old, ask your health care provider if you should take aspirin to prevent strokes.   Diabetes screening involves taking a blood sample to check your fasting blood sugar level. This should be done once every 3 years, after age 67, if you are within normal weight and without risk factors for diabetes. Testing should be considered at a younger age or be carried out more frequently if you are overweight and have at least 1 risk factor for diabetes.   Breast cancer screening is essential preventive care for women. You should practice "breast self-awareness."  This means understanding the normal appearance and feel of your breasts and may include breast self-examination.  Any changes detected, no matter how small, should be reported to a health care provider.  Women in their 36s and 30s should have a clinical breast exam (CBE) by a health care provider as part of a regular health exam every 1 to 3 years.  After age 74, women should have a CBE every year.  Starting at age 56, women should consider having a mammogram (breast X-ray test) every year.  Women who have a family history of breast cancer should talk to their health care provider about genetic screening.  Women at a high risk of breast cancer should talk to their health care providers about having an MRI and a mammogram every  year.   -Breast cancer gene (BRCA)-related cancer risk assessment is recommended for women who have family members with BRCA-related cancers. BRCA-related cancers include breast, ovarian, tubal, and peritoneal cancers. Having family members with these cancers may be associated with an increased risk for harmful changes (mutations) in the breast cancer genes BRCA1 and BRCA2. Results of the assessment will determine the need for genetic counseling and BRCA1 and BRCA2 testing.   The Pap test is a screening test for cervical cancer. A Pap test can show cell changes on the cervix that might become cervical cancer if left untreated. A Pap test is a procedure in which cells are obtained and examined from the lower end of the uterus (cervix).   - Women should have a Pap test starting at age 50.   - Between ages 50  and 29, Pap tests should be repeated every 2 years.   - Beginning at age 63, you should have a Pap test every 3 years as long as the past 3 Pap tests have been normal.   - Some women have medical problems that increase the chance of getting cervical cancer. Talk to your health care provider about these problems. It is especially important to talk to your health care provider if a new problem develops soon after your last Pap test. In these cases, your health care provider may recommend more frequent screening and Pap tests.   - The above recommendations are the same for women who have or have not gotten the vaccine for human papillomavirus (HPV).   - If you had a hysterectomy for a problem that was not cancer or a condition that could lead to cancer, then you no longer need Pap tests. Even if you no longer need a Pap test, a regular exam is a good idea to make sure no other problems are starting.   - If you are between ages 61 and 66 years, and you have had normal Pap tests going back 10 years, you no longer need Pap tests. Even if you no longer need a Pap test, a regular exam is a good idea to  make sure no other problems are starting.   - If you have had past treatment for cervical cancer or a condition that could lead to cancer, you need Pap tests and screening for cancer for at least 20 years after your treatment.   - If Pap tests have been discontinued, risk factors (such as a new sexual partner) need to be reassessed to determine if screening should be resumed.   - The HPV test is an additional test that may be used for cervical cancer screening. The HPV test looks for the virus that can cause the cell changes on the cervix. The cells collected during the Pap test can be tested for HPV. The HPV test could be used to screen women aged 62 years and older, and should be used in women of any age who have unclear Pap test results. After the age of 57, women should have HPV testing at the same frequency as a Pap test.   Colorectal cancer can be detected and often prevented. Most routine colorectal cancer screening begins at the age of 47 years and continues through age 80 years. However, your health care provider may recommend screening at an earlier age if you have risk factors for colon cancer. On a yearly basis, your health care provider may provide home test kits to check for hidden blood in the stool.  Use of a small camera at the end of a tube, to directly examine the colon (sigmoidoscopy or colonoscopy), can detect the earliest forms of colorectal cancer. Talk to your health care provider about this at age 24, when routine screening begins. Direct exam of the colon should be repeated every 5 -10 years through age 5 years, unless early forms of pre-cancerous polyps or small growths are found.   People who are at an increased risk for hepatitis B should be screened for this virus. You are considered at high risk for hepatitis B if:  -You were born in a country where hepatitis B occurs often. Talk with your health care provider about which countries are considered high risk.  - Your  parents were born in a high-risk country and you have not received a shot to protect against  hepatitis B (hepatitis B vaccine).  - You have HIV or AIDS.  - You use needles to inject street drugs.  - You live with, or have sex with, someone who has Hepatitis B.  - You get hemodialysis treatment.  - You take certain medicines for conditions like cancer, organ transplantation, and autoimmune conditions.   Hepatitis C blood testing is recommended for all people born from [birth year] through 1965 and any individual with known risks for hepatitis C.   Practice safe sex. Use condoms and avoid high-risk sexual practices to reduce the spread of sexually transmitted infections (STIs). STIs include gonorrhea, chlamydia, syphilis, trichomonas, herpes, HPV, and human immunodeficiency virus (HIV). Herpes, HIV, and HPV are viral illnesses that have no cure. They can result in disability, cancer, and death. Sexually active women aged 59 years and younger should be checked for chlamydia. Older women with new or multiple partners should also be tested for chlamydia. Testing for other STIs is recommended if you are sexually active and at increased risk.   Osteoporosis is a disease in which the bones lose minerals and strength with aging. This can result in serious bone fractures or breaks. The risk of osteoporosis can be identified using a bone density scan. Women ages 71 years and over and women at risk for fractures or osteoporosis should discuss screening with their health care providers. Ask your health care provider whether you should take a calcium supplement or vitamin D to There are also several preventive steps women can take to avoid osteoporosis and resulting fractures or to keep osteoporosis from worsening. -->Recommendations include:  Eat a balanced diet high in fruits, vegetables, calcium, and vitamins.  Get enough calcium. The recommended total intake of is 1,200 mg daily; for best absorption, if taking  supplements, divide doses into 250-500 mg doses throughout the day. Of the two types of calcium, calcium carbonate is best absorbed when taken with food but calcium citrate can be taken on an empty stomach.  Get enough vitamin D. NAMS and the [place] recommend at least 1,000 IU per day for women age 47 and over who are at risk of vitamin D deficiency. Vitamin D deficiency can be caused by inadequate sun exposure (for example, those who live in [place]).  Avoid alcohol and smoking. Heavy alcohol intake (more than 7 drinks per week) increases the risk of falls and hip fracture and women smokers tend to lose bone more rapidly and have lower bone mass than nonsmokers. Stopping smoking is one of the most important changes women can make to improve their health and decrease risk for disease.  Be physically active every day. Weight-bearing exercise (for example, fast walking, hiking, jogging, and weight training) may strengthen bones or slow the rate of bone loss that comes with aging. Balancing and muscle-strengthening exercises can reduce the risk of falling and fracture.  Consider therapeutic medications. Currently, several types of effective drugs are available. Healthcare providers can recommend the type most appropriate for each woman.  Eliminate environmental factors that may contribute to accidents. Falls cause nearly 90% of all osteoporotic fractures, so reducing this risk is an important bone-health strategy. Measures include ample lighting, removing obstructions to walking, using nonskid rugs on floors, and placing mats and/or grab bars in showers.  Be aware of medication side effects. Some common medicines make bones weaker. These include a type of steroid drug called glucocorticoids used for arthritis and asthma, some antiseizure drugs, certain sleeping pills, treatments for endometriosis, and some cancer  drugs. An overactive thyroid gland or using too much  thyroid hormone for an underactive thyroid can also be a problem. If you are taking these medicines, talk to your doctor about what you can do to help protect your bones.reduce the rate of osteoporosis.    Menopause can be associated with physical symptoms and risks. Hormone replacement therapy is available to decrease symptoms and risks. You should talk to your health care provider about whether hormone replacement therapy is right for you.   Use sunscreen. Apply sunscreen liberally and repeatedly throughout the day. You should seek shade when your shadow is shorter than you. Protect yourself by wearing long sleeves, pants, a wide-brimmed hat, and sunglasses year round, whenever you are outdoors.   Once a month, do a whole body skin exam, using a mirror to look at the skin on your back. Tell your health care provider of new moles, moles that have irregular borders, moles that are larger than a pencil eraser, or moles that have changed in shape or color.   -Stay current with required vaccines (immunizations).   Influenza vaccine. All adults should be immunized every year.  Tetanus, diphtheria, and acellular pertussis (Td, Tdap) vaccine. Pregnant women should receive 1 dose of Tdap vaccine during each pregnancy. The dose should be obtained regardless of the length of time since the last dose. Immunization is preferred during the 27th 36th week of gestation. An adult who has not previously received Tdap or who does not know her vaccine status should receive 1 dose of Tdap. This initial dose should be followed by tetanus and diphtheria toxoids (Td) booster doses every 10 years. Adults with an unknown or incomplete history of completing a 3-dose immunization series with Td-containing vaccines should begin or complete a primary immunization series including a Tdap dose. Adults should receive a Td booster every 10 years.  Varicella vaccine. An adult without evidence of immunity to varicella should  receive 2 doses or a second dose if she has previously received 1 dose. Pregnant females who do not have evidence of immunity should receive the first dose after pregnancy. This first dose should be obtained before leaving the health care facility. The second dose should be obtained 4 8 weeks after the first dose.  Human papillomavirus (HPV) vaccine. Females aged 30 26 years who have not received the vaccine previously should obtain the 3-dose series. The vaccine is not recommended for use in pregnant females. However, pregnancy testing is not needed before receiving a dose. If a female is found to be pregnant after receiving a dose, no treatment is needed. In that case, the remaining doses should be delayed until after the pregnancy. Immunization is recommended for any person with an immunocompromised condition through the age of 69 years if she did not get any or all doses earlier. During the 3-dose series, the second dose should be obtained 4 8 weeks after the first dose. The third dose should be obtained 24 weeks after the first dose and 16 weeks after the second dose.  Zoster vaccine. One dose is recommended for adults aged 8 years or older unless certain conditions are present.  Measles, mumps, and rubella (MMR) vaccine. Adults born before [birth year] generally are considered immune to measles and mumps. Adults born in [birth year] or later should have 1 or more doses of MMR vaccine unless there is a contraindication to the vaccine or there is laboratory evidence of immunity to each of the three diseases. A routine second dose of MMR vaccine  should be obtained at least 28 days after the first dose for students attending postsecondary schools, health care workers, or international travelers. People who received inactivated measles vaccine or an unknown type of measles vaccine during 1963 1967 should receive 2 doses of MMR vaccine. People who received inactivated mumps vaccine or an unknown type of mumps vaccine before  1979 and are at high risk for mumps infection should consider immunization with 2 doses of MMR vaccine. For females of childbearing age, rubella immunity should be determined. If there is no evidence of immunity, females who are not pregnant should be vaccinated. If there is no evidence of immunity, females who are pregnant should delay immunization until after pregnancy. Unvaccinated health care workers born before [birth year] who lack laboratory evidence of measles, mumps, or rubella immunity or laboratory confirmation of disease should consider measles and mumps immunization with 2 doses of MMR vaccine or rubella immunization with 1 dose of MMR vaccine.  Pneumococcal 13-valent conjugate (PCV13) vaccine. When indicated, a person who is uncertain of her immunization history and has no record of immunization should receive the PCV13 vaccine. An adult aged 74 years or older who has certain medical conditions and has not been previously immunized should receive 1 dose of PCV13 vaccine. This PCV13 should be followed with a dose of pneumococcal polysaccharide (PPSV23) vaccine. The PPSV23 vaccine dose should be obtained at least 8 weeks after the dose of PCV13 vaccine. An adult aged 88 years or older who has certain medical conditions and previously received 1 or more doses of PPSV23 vaccine should receive 1 dose of PCV13. The PCV13 vaccine dose should be obtained 1 or more years after the last PPSV23 vaccine dose.  Pneumococcal polysaccharide (PPSV23) vaccine. When PCV13 is also indicated, PCV13 should be obtained first. All adults aged 59 years and older should be immunized. An adult younger than age 72 years who has certain medical conditions should be immunized. Any person who resides in a nursing home or long-term care facility should be immunized. An adult smoker should be immunized. People with an immunocompromised condition and certain other conditions should receive both PCV13 and PPSV23 vaccines. People with human  immunodeficiency virus (HIV) infection should be immunized as soon as possible after diagnosis. Immunization during chemotherapy or radiation therapy should be avoided. Routine use of PPSV23 vaccine is not recommended for American Indians, [place] Natives, or people younger than 65 years unless there are medical conditions that require PPSV23 vaccine. When indicated, people who have unknown immunization and have no record of immunization should receive PPSV23 vaccine. One-time revaccination 5 years after the first dose of PPSV23 is recommended for people aged 41 64 years who have chronic kidney failure, nephrotic syndrome, asplenia, or immunocompromised conditions. People who received 1 2 doses of PPSV23 before age 6 years should receive another dose of PPSV23 vaccine at age 76 years or later if at least 5 years have passed since the previous dose. Doses of PPSV23 are not needed for people immunized with PPSV23 at or after age 74 years.  Meningococcal vaccine. Adults with asplenia or persistent complement component deficiencies should receive 2 doses of quadrivalent meningococcal conjugate (MenACWY-D) vaccine. The doses should be obtained at least 2 months apart. Microbiologists working with certain meningococcal bacteria, [place] recruits, people at risk during an outbreak, and people who travel to or live in countries with a high rate of meningitis should be immunized. A first-year college student up through age 58 years who is living in a residence hall  should receive a dose if she did not receive a dose on or after her 16th birthday. Adults who have certain high-risk conditions should receive one or more doses of vaccine.  Hepatitis A vaccine. Adults who wish to be protected from this disease, have certain high-risk conditions, work with hepatitis A-infected animals, work in hepatitis A research labs, or travel to or work in countries with a high rate of hepatitis A should be immunized. Adults who were  previously unvaccinated and who anticipate close contact with an international adoptee during the first 60 days after arrival in the [place] from a country with a high rate of hepatitis A should be immunized.  Hepatitis B vaccine.  Adults who wish to be protected from this disease, have certain high-risk conditions, may be exposed to blood or other infectious body fluids, are household contacts or sex partners of hepatitis B positive people, are clients or workers in certain care facilities, or travel to or work in countries with a high rate of hepatitis B should be immunized.  Haemophilus influenzae type b (Hib) vaccine. A previously unvaccinated person with asplenia or sickle cell disease or having a scheduled splenectomy should receive 1 dose of Hib vaccine. Regardless of previous immunization, a recipient of a hematopoietic stem cell transplant should receive a 3-dose series 6 12 months after her successful transplant. Hib vaccine is not recommended for adults with HIV infection.  Preventive Services / Frequency Ages 84 years and over  Blood pressure check.** / Every 1 to 2 years.  Lipid and cholesterol check.** / Every 5 years beginning at age 47 years.  Lung cancer screening. / Every year if you are aged 26 80 years and have a 30-pack-year history of smoking and currently smoke or have quit within the past 15 years. Yearly screening is stopped once you have quit smoking for at least 15 years or develop a health problem that would prevent you from having lung cancer treatment.  Clinical breast exam.** / Every year after age 33 years.  BRCA-related cancer risk assessment.** / For women who have family members with a BRCA-related cancer (breast, ovarian, tubal, or peritoneal cancers).  Mammogram.** / Every year beginning at age 38 years and continuing for as long as you are in good health. Consult with your health care provider.  Pap test.** / Every 3 years starting at age 68 years  through age 64 or 48 years with 3 consecutive normal Pap tests. Testing can be stopped between 65 and 70 years with 3 consecutive normal Pap tests and no abnormal Pap or HPV tests in the past 10 years.  HPV screening.** / Every 3 years from ages 4 years through ages 21 or 64 years with a history of 3 consecutive normal Pap tests. Testing can be stopped between 65 and 70 years with 3 consecutive normal Pap tests and no abnormal Pap or HPV tests in the past 10 years.  Fecal occult blood test (FOBT) of stool. / Every year beginning at age 65 years and continuing until age 30 years. You may not need to do this test if you get a colonoscopy every 10 years.  Flexible sigmoidoscopy or colonoscopy.** / Every 5 years for a flexible sigmoidoscopy or every 10 years for a colonoscopy beginning at age 70 years and continuing until age 17 years.  Hepatitis C blood test.** / For all people born from [birth year] through 1966 and any individual with known risks for hepatitis C.  Osteoporosis screening.** / A one-time screening  for women ages 12 years and over and women at risk for fractures or osteoporosis.  Skin self-exam. / Monthly.  Influenza vaccine. / Every year.  Tetanus, diphtheria, and acellular pertussis (Tdap/Td) vaccine.** / 1 dose of Td every 10 years.  Varicella vaccine.** / Consult your health care provider.  Zoster vaccine.** / 1 dose for adults aged 31 years or older.  Pneumococcal 13-valent conjugate (PCV13) vaccine.** / Consult your health care provider.  Pneumococcal polysaccharide (PPSV23) vaccine.** / 1 dose for all adults aged 13 years and older.  Meningococcal vaccine.** / Consult your health care provider.  Hepatitis A vaccine.** / Consult your health care provider.  Hepatitis B vaccine.** / Consult your health care provider.  Haemophilus influenzae type b (Hib) vaccine.** / Consult your health care provider. ** Family history and personal history of risk and conditions may change  your health care provider's recommendations. Document Released: 10/28/2001 Document Revised: 06/22/2013  [facility] Patient Information 2014 [place], [place].   EXERCISE AND DIET:  We recommended that you start or continue a regular exercise program for good health. Regular exercise means any activity that makes your heart beat faster and makes you sweat.  We recommend exercising at least 30 minutes per day at least 3 days a week, preferably 5.  We also recommend a diet low in fat and sugar / carbohydrates.  Inactivity, poor dietary choices and obesity can cause diabetes, heart attack, stroke, and kidney damage, among others.     ALCOHOL AND SMOKING:  Women should limit their alcohol intake to no more than 7 drinks/beers/glasses of wine (combined, not each!) per week. Moderation of alcohol intake to this level decreases your risk of breast cancer and liver damage.  ( And of course, no recreational drugs are part of a healthy lifestyle.)  Also, you should not be smoking at all or even being exposed to second hand smoke. Most people know smoking can cause cancer, and various heart and lung diseases, but did you know it also contributes to weakening of your bones?  Aging of your skin?  Yellowing of your teeth and nails?   CALCIUM AND VITAMIN D:  Adequate intake of calcium and Vitamin D are recommended.  The recommendations for exact amounts of these supplements seem to change often, but generally speaking 600 mg of calcium (either carbonate or citrate) and 800 units of Vitamin D per day seems prudent. Certain women may benefit from higher intake of Vitamin D.  If you are among these women, your doctor will have told you during your visit.     PAP SMEARS:  Pap smears, to check for cervical cancer or precancers,  have traditionally been done yearly, although recent scientific advances have shown that most women can have pap smears less often.  However, every woman still should have a physical exam from her  gynecologist or primary care physician every year. It will include a breast check, inspection of the vulva and vagina to check for abnormal growths or skin changes, a visual exam of the cervix, and then an exam to evaluate the size and shape of the uterus and ovaries.  And after 79 years of age, a rectal exam is indicated to check for rectal cancers. We will also provide age appropriate advice regarding health maintenance, like when you should have certain vaccines, screening for sexually transmitted diseases, bone density testing, colonoscopy, mammograms, etc.    MAMMOGRAMS:  All women over 30 years old should have a yearly mammogram. Many facilities now offer a "  3D" mammogram, which may cost around $50 extra out of pocket. If possible,  we recommend you accept the option to have the 3D mammogram performed.  It both reduces the number of women who will be called back for extra views which then turn out to be normal, and it is better than the routine mammogram at detecting truly abnormal areas.     COLONOSCOPY:  Colonoscopy to screen for colon cancer is recommended for all women at age 67.  We know, you hate the idea of the prep.  We agree, BUT, having colon cancer and not knowing it is worse!!  Colon cancer so often starts as a polyp that can be seen and removed at colonscopy, which can quite literally save your life!  And if your first colonoscopy is normal and you have no family history of colon cancer, most women don't have to have it again for 10 years.  Once every ten years, you can do something that may end up saving your life, right?  We will be happy to help you get it scheduled when you are ready.  Be sure to check your insurance coverage so you understand how much it will cost.  It may be covered as a preventative service at no cost, but you should check your particular policy.

## 2019-06-15 NOTE — Progress Notes (Signed)
Subjective:   Kelli [name] is a 78 y.o. female who presents for Medicare Annual (Subsequent) preventive examination.     Objective:    Vitals: BP 118/69 (BP Location: Left Arm, Patient Position: Sitting, Cuff Size: Large)   Pulse (!) 57   Ht 5' 1.75" (1.568 m)   Wt 165 lb (74.8 kg)   SpO2 99%   BMI 30.42 kg/m   Body mass index is 30.42 kg/m.  Advanced Directives 06/15/2019 12/24/2013 03/09/2013  Does Patient Have a Medical Advance Directive? Yes Patient has advance directive, copy not in chart Patient has advance directive, copy not in chart  Type of Advance Directive - Living will Living will;Healthcare Power of Attorney  Does patient want to make changes to medical advance directive? - No change requested -  Copy of Sebastian in Chart? - Copy requested from family Copy requested from family  Pre-existing out of facility DNR order (yellow form or pink MOST form) - No -    Tobacco Social History   Tobacco Use  Smoking Status Never Smoker  Smokeless Tobacco Never Used     Counseling given: Not Answered    Past Medical History:  Diagnosis Date  . Atrial fibrillation ([place]) 02/2013   paroxsymal; c/b Type 2 NSTEMI; CHADS2-VASc=2 (age, sex - patient had come off anti-HTN meds in past); Eliquis started;  Echocardiogram 03/10/13: EF 0000000, grade 1 diastolic dysfunction, mild MR, mild LAE  . Hyperlipidemia   . Hypertension   . NSTEMI (non-ST elevated myocardial infarction) ([place]) 02/2013   demand ischemia in setting of AFib with RVR; ETT-Myoview 03/11/13: No ischemia or scar, EF 70%.    Past Surgical History:  Procedure Laterality Date  . APPENDECTOMY    . CATARACT EXTRACTION, BILATERAL    . EYE SURGERY    . TONSILLECTOMY     Family History  Problem Relation Age of Onset  . Heart attack Mother 52  . Cancer Maternal Aunt        throat   Social History   Socioeconomic History  . Marital status: Widowed    Spouse name: Not on file  . Number of  children: 3  . Years of education: Not on file  . Highest education level: Not on file  Occupational History  . Occupation: Surveyor, quantity- Day care    Employer: [employer]  . Financial resource strain: Not on file  . Food insecurity    Worry: Not on file    Inability: Not on file  . Transportation needs    Medical: Not on file    Non-medical: Not on file  Tobacco Use  . Smoking status: Never Smoker  . Smokeless tobacco: Never Used  Substance and Sexual Activity  . Alcohol use: Yes    Comment: 1-2 glasses of wine occ.  . Drug use: No  . Sexual activity: Not Currently  Lifestyle  . Physical activity    Days per week: Not on file    Minutes per session: Not on file  . Stress: Not on file  Relationships  . Social Herbalist on phone: Not on file    Gets together: Not on file    Attends religious service: Not on file    Active member of club or organization: Not on file    Attends meetings of clubs or organizations: Not on file    Relationship status: Not on file  Other Topics Concern  . Not  on file  Social History Narrative  . Not on file    Outpatient Encounter Medications as of 06/15/2019  Medication Sig  . acetaminophen (TYLENOL) 500 MG tablet Take 500 mg by mouth daily as needed for pain.  [name] atorvastatin (LIPITOR) 10 MG tablet Take 5 mg by mouth daily.   . Calcium Carb-Cholecalciferol (CALCIUM + D3) 600-200 MG-UNIT TABS Take 1 tablet by mouth daily.  . Cholecalciferol (VITAMIN D) 400 UNITS capsule Take 400 Units by mouth daily.  [name] Calcium (STOOL SOFTENER PO) Take by mouth at bedtime.  [name] ELIQUIS 5 MG TABS tablet TAKE 1 TABLET BY MOUTH 2 TIMES DAILY.  [name] FIBER ADULT GUMMIES PO Take 1 Dose by mouth daily.  . fish oil-omega-3 fatty acids 1000 MG capsule Take 1 g by mouth daily.  . Flaxseed, Linseed, (FLAX SEED OIL PO) Take by mouth daily.  . fluticasone (FLONASE) 50 MCG/ACT nasal spray Place 1 spray into both nostrils  daily as needed.   . hydrochlorothiazide (HYDRODIURIL) 12.5 MG tablet Take 12.5 mg by mouth daily.  [name] losartan (COZAAR) 50 MG tablet Take 1 tablet (50 mg total) by mouth daily. (Patient taking differently: Take 25 mg by mouth daily. )  . Magnesium 300 MG CAPS Take 1 capsule by mouth daily.  . metoprolol tartrate (LOPRESSOR) 50 MG tablet Take 1 tablet ( 50 mg ) in morning and take 1/2 tablet ( 25 mg ) in afternoon  . Multiple Vitamin (MULTIVITAMIN) capsule Take 1 capsule by mouth daily.  . Probiotic Product (PROBIOTIC DAILY PO) Take 1 tablet by mouth daily.  . vitamin C (ASCORBIC ACID) 500 MG tablet Take 500 mg by mouth daily.  . [DISCONTINUED] chlorpheniramine-HYDROcodone (TUSSIONEX) 10-8 MG/5ML SUER Take 5 mLs by mouth every 12 (twelve) hours as needed for cough (cough, will cause drowsiness.). (Patient not taking: Reported on 06/13/2019)   No facility-administered encounter medications on file as of 06/15/2019.     Activities of Daily Living In your present state of health, do you have any difficulty performing the following activities: 06/15/2019  Hearing? N  Vision? N  Difficulty concentrating or making decisions? N  Walking or climbing stairs? N  Dressing or bathing? N  Doing errands, shopping? N  Some recent data might be hidden    Patient Care Team: Mellody Dance, DO as PCP - General (Family Medicine) Martinique, Peter M, MD as Consulting Physician (Cardiology) Allyn Kenner, MD as Consulting Physician (Dermatology) Wonda Horner, MD as Consulting Physician (Gastroenterology)    Assessment:   This is a routine wellness examination for Kelli [name].  Exercise Activities and Dietary recommendations    Goals   None    6CIT Screen 06/15/2019 12/22/2017  What Year? 0 points 0 points  What month? 0 points 0 points  What time? 0 points 0 points  Count back from 20 0 points 0 points  Months in reverse 0 points 0 points  Repeat phrase 0 points 0 points  Total Score 0 0    Fall  Risk Fall Risk  06/15/2019 08/09/2018 04/07/2018 12/22/2017 11/02/2017  Falls in the past year? 1 0 No No No  Number falls in past yr: 0 - - - -  Injury with Fall? 1 - - - -   Is the patient's home free of loose throw rugs in walkways, pet beds, electrical cords, etc?   yes      Grab bars in the bathroom? yes      Handrails on the stairs?   no  stairs      Adequate lighting?   yes  Timed Get Up and Go performed: WNL's  Depression Screen PHQ 2/9 Scores 06/15/2019 02/28/2019 11/19/2018 08/09/2018  PHQ - 2 Score 0 0 0 0  PHQ- 9 Score 2 0 1 0     Cognitive Function     6CIT Screen 06/15/2019 12/22/2017  What Year? 0 points 0 points  What month? 0 points 0 points  What time? 0 points 0 points  Count back from 20 0 points 0 points  Months in reverse 0 points 0 points  Repeat phrase 0 points 0 points  Total Score 0 0    Immunization History  Administered Date(s) Administered  . Influenza, High Dose Seasonal PF 08/09/2018  . Pneumococcal Polysaccharide-23 06/12/2009  . Tdap 07/16/2011    Qualifies for Shingles Vaccine? Up to date  Screening Tests Health Maintenance  Topic Date Due  . INFLUENZA VACCINE  04/16/2019  . PNA vac Low Risk Adult (2 of 2 - PCV13) 11/19/2019 (Originally 06/12/2010)  . TETANUS/TDAP  07/15/2021  . DEXA SCAN  Completed    Cancer Screenings: Lung: Low Dose CT Chest recommended if Age 9-80 years, 30 pack-year currently smoking OR have quit w/in 15years. Patient does not qualify. Breast:  Up to date on Mammogram? Yes   Up to date of Bone Density/Dexa? Yes Colorectal: 03/24/2014  Additional Screenings: : Hepatitis C Screening: denied     Plan:   Orders Placed This Encounter  Procedures  . COMPLETE METABOLIC PANEL WITH GFR  . Hemoglobin A1c  . Lipid panel  . TSH  . Vitamin D 1,25 dihydroxy  . CBC with Differential/Platelet  . T4, free  . T3  . Comprehensive metabolic panel  She will let us know about shingles vaccine that she thinks she is gotten  from her prior PCP.  She will call us with this information    Health Maintenance  Topic Date Due  . Flu Shot  04/16/2019--patient declined today and said she will come in end of October 2020 for this  . Pneumonia vaccines (2 of 2 - PCV13) 11/19/2019*  . Tetanus Vaccine  07/15/2021  . DEXA scan (bone density measurement)  Completed-  UTD  *Topic was postponed. The date shown is not the original due date.     Immunization History  Administered Date(s) Administered  . Influenza, High Dose Seasonal PF 08/09/2018  . Pneumococcal Polysaccharide-23 06/12/2009  . Tdap 07/16/2011    I have personally reviewed and noted the following in the patient's chart:   . Medical and social history . Use of alcohol, tobacco or illicit drugs  . Current medications and supplements . Functional ability and status . Nutritional status . Physical activity . Advanced directives . List of other physicians . Hospitalizations, surgeries, and ER visits in previous 12 months . Vitals . Screenings to include cognitive, depression, and falls . Referrals and appointments  In addition, I have reviewed and discussed with patient certain preventive protocols, quality metrics, and best practice recommendations. A written personalized care plan for preventive services as well as general preventive health recommendations were provided to patient.     Mellody Dance, DO  06/15/2019

## 2019-06-16 LAB — COMPREHENSIVE METABOLIC PANEL
ALT: 23 IU/L (ref 0–32)
AST: 33 IU/L (ref 0–40)
Albumin/Globulin Ratio: 1.5 (ref 1.2–2.2)
Albumin: 4.4 g/dL (ref 3.7–4.7)
Alkaline Phosphatase: 71 IU/L (ref 39–117)
BUN/Creatinine Ratio: 18 (ref 12–28)
BUN: 20 mg/dL (ref 8–27)
Bilirubin Total: 0.6 mg/dL (ref 0.0–1.2)
CO2: 22 mmol/L (ref 20–29)
Calcium: 9.6 mg/dL (ref 8.7–10.3)
Chloride: 99 mmol/L (ref 96–106)
Creatinine, Ser: 1.1 mg/dL — ABNORMAL HIGH (ref 0.57–1.00)
GFR calc Af Amer: 56 mL/min/{1.73_m2} — ABNORMAL LOW (ref >59)
GFR calc non Af Amer: 48 mL/min/{1.73_m2} — ABNORMAL LOW (ref >59)
Globulin, Total: 3 g/dL (ref 1.5–4.5)
Glucose: 98 mg/dL (ref 65–99)
Potassium: 5.2 mmol/L (ref 3.5–5.2)
Sodium: 138 mmol/L (ref 134–144)
Total Protein: 7.4 g/dL (ref 6.0–8.5)

## 2019-06-17 ENCOUNTER — Telehealth: Payer: Self-pay

## 2019-06-17 DIAGNOSIS — E782 Mixed hyperlipidemia: Secondary | ICD-10-CM

## 2019-06-17 DIAGNOSIS — Z79899 Other long term (current) drug therapy: Secondary | ICD-10-CM

## 2019-06-17 DIAGNOSIS — E875 Hyperkalemia: Secondary | ICD-10-CM

## 2019-06-17 DIAGNOSIS — I10 Essential (primary) hypertension: Secondary | ICD-10-CM

## 2019-06-17 MED ORDER — ATORVASTATIN CALCIUM 20 MG PO TABS: 20.0000 mg | ORAL_TABLET | Freq: Every day | ORAL | 3 refills | Status: DC

## 2019-06-17 NOTE — Telephone Encounter (Signed)
-----   Message from Mellody Dance, DO sent at 06/17/2019  8:48 AM EDT ----- Please send in prescription for Lipitor 20 mg 1 p.o. nightly dispense 90 with 1 refill -Placed future lab of a CMP in 3 months along with a fasting lipid profile.  My chart sent as below:  Dear Kelli [name],   I reviewed your recent blood work and most labs that we obtained are within normal limits.    If you notice your A1c, which is a measure of your prediabetes, is up a little from prior but still does not warrant medications.  It is just important you engage in prudent diet and exercise to help lower your A1c.  We should recheck this in 6 months.  Please add this to your calendar.  Also your LDL or bad cholesterol is up from prior.   With your history, this level needs to be lower.  I recommend you increase the Lipitor to 20 mg nightly.  Once we do this you should come in in 3 months or so to recheck your liver enzymes as well as to recheck a cholesterol panel.   Please follow up with me as we discussed during our last office visit together as well as for lab repeat in 3 months.  Please let me know if you have any further questions or concerns.  Take care of yourself!   Be well,   Dr Raliegh Scarlet

## 2019-06-17 NOTE — Telephone Encounter (Signed)
Lipitor rx sent, labs ordered.  Left message for patient to follow up if she has any concerns.

## 2019-06-20 LAB — LIPID PANEL
Chol/HDL Ratio: 3.5 ratio (ref 0.0–4.4)
Cholesterol, Total: 171 mg/dL (ref 100–199)
HDL: 49 mg/dL (ref >39)
LDL Chol Calc (NIH): 105 mg/dL — ABNORMAL HIGH (ref 0–99)
Triglycerides: 89 mg/dL (ref 0–149)
VLDL Cholesterol Cal: 17 mg/dL (ref 5–40)

## 2019-06-20 LAB — CBC WITH DIFFERENTIAL/PLATELET
Basophils Absolute: 0.1 10*3/uL (ref 0.0–0.2)
Basos: 1 %
EOS (ABSOLUTE): 0.3 10*3/uL (ref 0.0–0.4)
Eos: 4 %
Hematocrit: 42.6 % (ref 34.0–46.6)
Hemoglobin: 14.3 g/dL (ref 11.1–15.9)
Immature Grans (Abs): 0 10*3/uL (ref 0.0–0.1)
Immature Granulocytes: 0 %
Lymphocytes Absolute: 2.1 10*3/uL (ref 0.7–3.1)
Lymphs: 28 %
MCH: 30.6 pg (ref 26.6–33.0)
MCHC: 33.6 g/dL (ref 31.5–35.7)
MCV: 91 fL (ref 79–97)
Monocytes Absolute: 0.8 10*3/uL (ref 0.1–0.9)
Monocytes: 11 %
Neutrophils Absolute: 4.2 10*3/uL (ref 1.4–7.0)
Neutrophils: 56 %
Platelets: 262 10*3/uL (ref 150–450)
RBC: 4.67 x10E6/uL (ref 3.77–5.28)
RDW: 12.6 % (ref 11.7–15.4)
WBC: 7.4 10*3/uL (ref 3.4–10.8)

## 2019-06-20 LAB — T4, FREE: Free T4: 1.49 ng/dL (ref 0.82–1.77)

## 2019-06-20 LAB — VITAMIN D 1,25 DIHYDROXY
Vitamin D 1, 25 (OH)2 Total: 19 pg/mL — ABNORMAL LOW
Vitamin D2 1, 25 (OH)2: <10 pg/mL
Vitamin D3 1, 25 (OH)2: 19 pg/mL

## 2019-06-20 LAB — HEMOGLOBIN A1C
Est. average glucose Bld gHb Est-mCnc: 123 mg/dL
Hgb A1c MFr Bld: 5.9 % — ABNORMAL HIGH (ref 4.8–5.6)

## 2019-06-20 LAB — TSH: TSH: 1.46 u[IU]/mL (ref 0.450–4.500)

## 2019-06-20 LAB — T3: T3, Total: 118 ng/dL (ref 71–180)

## 2019-07-11 ENCOUNTER — Encounter: Payer: Self-pay | Admitting: Family Medicine

## 2019-07-11 ENCOUNTER — Telehealth: Payer: Self-pay | Admitting: Family Medicine

## 2019-07-11 NOTE — Telephone Encounter (Signed)
Per Dr. Raliegh Scarlet- contact patient and advise that she has been exposed to a positive case of COVID and we advise her to go get tested and to quarantine for 14 days.

## 2019-07-12 ENCOUNTER — Other Ambulatory Visit: Payer: Self-pay

## 2019-07-12 DIAGNOSIS — Z20822 Contact with and (suspected) exposure to covid-19: Secondary | ICD-10-CM

## 2019-07-13 ENCOUNTER — Other Ambulatory Visit: Payer: Self-pay | Admitting: Family Medicine

## 2019-07-13 ENCOUNTER — Ambulatory Visit: Payer: Medicare Other

## 2019-07-14 LAB — NOVEL CORONAVIRUS, NAA: SARS-CoV-2, NAA: NOT DETECTED

## 2019-07-22 DIAGNOSIS — M9903 Segmental and somatic dysfunction of lumbar region: Secondary | ICD-10-CM | POA: Diagnosis not present

## 2019-07-22 DIAGNOSIS — M47895 Other spondylosis, thoracolumbar region: Secondary | ICD-10-CM | POA: Diagnosis not present

## 2019-07-22 DIAGNOSIS — M9902 Segmental and somatic dysfunction of thoracic region: Secondary | ICD-10-CM | POA: Diagnosis not present

## 2019-07-22 DIAGNOSIS — M47897 Other spondylosis, lumbosacral region: Secondary | ICD-10-CM | POA: Diagnosis not present

## 2019-07-22 DIAGNOSIS — M9904 Segmental and somatic dysfunction of sacral region: Secondary | ICD-10-CM | POA: Diagnosis not present

## 2019-08-16 ENCOUNTER — Other Ambulatory Visit: Payer: Self-pay

## 2019-08-16 ENCOUNTER — Ambulatory Visit (INDEPENDENT_AMBULATORY_CARE_PROVIDER_SITE_OTHER): Payer: Medicare Other

## 2019-08-16 DIAGNOSIS — Z23 Encounter for immunization: Secondary | ICD-10-CM | POA: Diagnosis not present

## 2019-08-16 NOTE — Progress Notes (Signed)
Pt here for influenza vaccine.  Screening questionnaire reviewed, VIS provided to patient, and any/all patient questions answered.  T. Graceanna Theissen, CMA  

## 2019-08-22 DIAGNOSIS — M47897 Other spondylosis, lumbosacral region: Secondary | ICD-10-CM | POA: Diagnosis not present

## 2019-08-22 DIAGNOSIS — M9903 Segmental and somatic dysfunction of lumbar region: Secondary | ICD-10-CM | POA: Diagnosis not present

## 2019-08-22 DIAGNOSIS — M9902 Segmental and somatic dysfunction of thoracic region: Secondary | ICD-10-CM | POA: Diagnosis not present

## 2019-08-22 DIAGNOSIS — M47895 Other spondylosis, thoracolumbar region: Secondary | ICD-10-CM | POA: Diagnosis not present

## 2019-08-22 DIAGNOSIS — M9904 Segmental and somatic dysfunction of sacral region: Secondary | ICD-10-CM | POA: Diagnosis not present

## 2019-08-26 ENCOUNTER — Other Ambulatory Visit: Payer: Self-pay | Admitting: Cardiology

## 2019-08-26 ENCOUNTER — Other Ambulatory Visit: Payer: Self-pay | Admitting: *Deleted

## 2019-08-26 MED ORDER — APIXABAN 5 MG PO TABS: 5.0000 mg | ORAL_TABLET | Freq: Two times a day (BID) | ORAL | 1 refills | Status: DC

## 2019-08-26 NOTE — Telephone Encounter (Signed)
78 F 74.8 kg, SCr 1.1 (05/2019), LOV Martinique 05/2019

## 2019-08-26 NOTE — Telephone Encounter (Signed)
Patient needs refills but Sat.[place]

## 2019-08-29 NOTE — Telephone Encounter (Signed)
Called patient no answer.Left message on personal voice mail to call back concerning scheduling appointment with Dr.Jordan and refilling Eliquis.

## 2019-08-31 ENCOUNTER — Telehealth: Payer: Self-pay

## 2019-08-31 MED ORDER — APIXABAN 5 MG PO TABS: 5.0000 mg | ORAL_TABLET | Freq: Two times a day (BID) | ORAL | 3 refills | Status: DC

## 2019-08-31 NOTE — Telephone Encounter (Signed)
Spoke to patient she stated she saw Dr.Jordan this past Sept and she was told to follow up in 1 year.She stated she needs more Eliquis refills.Advised I will put more refills on Eliquis.

## 2019-09-18 DIAGNOSIS — K591 Functional diarrhea: Secondary | ICD-10-CM | POA: Diagnosis not present

## 2019-09-18 DIAGNOSIS — R519 Headache, unspecified: Secondary | ICD-10-CM | POA: Diagnosis not present

## 2019-09-19 ENCOUNTER — Other Ambulatory Visit: Payer: Self-pay | Admitting: Family Medicine

## 2019-09-19 ENCOUNTER — Telehealth: Payer: Self-pay | Admitting: Family Medicine

## 2019-09-19 DIAGNOSIS — R0789 Other chest pain: Secondary | ICD-10-CM | POA: Diagnosis not present

## 2019-09-19 DIAGNOSIS — G4489 Other headache syndrome: Secondary | ICD-10-CM | POA: Diagnosis not present

## 2019-09-19 DIAGNOSIS — R079 Chest pain, unspecified: Secondary | ICD-10-CM | POA: Diagnosis not present

## 2019-09-19 DIAGNOSIS — I4891 Unspecified atrial fibrillation: Secondary | ICD-10-CM | POA: Diagnosis not present

## 2019-09-19 DIAGNOSIS — H547 Unspecified visual loss: Secondary | ICD-10-CM | POA: Diagnosis not present

## 2019-09-19 NOTE — Telephone Encounter (Signed)
As we discussed. AS, CMA

## 2019-09-19 NOTE — Telephone Encounter (Signed)
Patient called, stating that she had a COVID test and it was negative but that she is having a little bit of stomach indigestion /burping, loose stool, no fever but a slight headache and was questioning if there is anything over the counter she could possibly take. Please Advise. AM

## 2019-09-19 NOTE — Telephone Encounter (Signed)
Telehealth appt has been made. AM

## 2019-09-20 ENCOUNTER — Telehealth: Payer: Self-pay | Admitting: Cardiology

## 2019-09-20 NOTE — Telephone Encounter (Signed)
Returned call to patient she stated she is taking Losartan 50 mg 1/2 tablet daily.She will increase to 1 tablet 50 mg daily.Advised to continue to monitor B/P.Advised to call back if B/P elevated.Follow up appointment scheduled with Dr.Jordan 11/21/19 at 11:40 am.

## 2019-09-20 NOTE — Telephone Encounter (Signed)
Follow up ° ° °Patient is returning your call. Please call. ° ° ° °

## 2019-09-20 NOTE — Telephone Encounter (Signed)
LM TO CALL BACK ./CY 

## 2019-09-20 NOTE — Telephone Encounter (Signed)
Pt c/o BP issue:  1. What are your last 5 BP readings? Last night it was 202/90 right arm 188/86 - [place]   2. Are you having any other symptoms (ex. Dizziness, headache,  blurred vision, passed out)? Pressure headache and upper jaw pressure  3. What is your medication issue?  High blood pressure- pt wants to be seen today

## 2019-09-20 NOTE — Telephone Encounter (Signed)
I would recommend she increase losartan to 100 mg daily for elevated BP. Can follow up in a couple of months.  Thelbert Gartin Martinique MD, [facility]

## 2019-09-20 NOTE — Telephone Encounter (Signed)
Spoke with pt and feels better today Pt had B/P checked yesterday  and was elevated rechecked when got home and had come down some See readings below Pt said for about a week had headache(pressure) weird feeling and last Thursday indigestion with some diarrhea Pt has tested negative for covid  Encouraged pt to monitor B/P for a few days and call back the end of week with readings Will forward to Dr Martinique for review

## 2019-09-21 ENCOUNTER — Ambulatory Visit (INDEPENDENT_AMBULATORY_CARE_PROVIDER_SITE_OTHER): Payer: Medicare Other | Admitting: Family Medicine

## 2019-09-21 ENCOUNTER — Encounter: Payer: Self-pay | Admitting: Family Medicine

## 2019-09-21 ENCOUNTER — Other Ambulatory Visit: Payer: Self-pay

## 2019-09-21 VITALS — BP 127/76 | Ht 63.0 in | Wt 168.0 lb

## 2019-09-21 DIAGNOSIS — R195 Other fecal abnormalities: Secondary | ICD-10-CM

## 2019-09-21 DIAGNOSIS — M4723 Other spondylosis with radiculopathy, cervicothoracic region: Secondary | ICD-10-CM | POA: Diagnosis not present

## 2019-09-21 DIAGNOSIS — Z79899 Other long term (current) drug therapy: Secondary | ICD-10-CM | POA: Insufficient documentation

## 2019-09-21 DIAGNOSIS — M9904 Segmental and somatic dysfunction of sacral region: Secondary | ICD-10-CM | POA: Diagnosis not present

## 2019-09-21 DIAGNOSIS — M9902 Segmental and somatic dysfunction of thoracic region: Secondary | ICD-10-CM | POA: Diagnosis not present

## 2019-09-21 DIAGNOSIS — I10 Essential (primary) hypertension: Secondary | ICD-10-CM

## 2019-09-21 DIAGNOSIS — R142 Eructation: Secondary | ICD-10-CM

## 2019-09-21 DIAGNOSIS — Z7189 Other specified counseling: Secondary | ICD-10-CM | POA: Insufficient documentation

## 2019-09-21 DIAGNOSIS — E782 Mixed hyperlipidemia: Secondary | ICD-10-CM

## 2019-09-21 DIAGNOSIS — Z23 Encounter for immunization: Secondary | ICD-10-CM | POA: Insufficient documentation

## 2019-09-21 DIAGNOSIS — M9901 Segmental and somatic dysfunction of cervical region: Secondary | ICD-10-CM | POA: Diagnosis not present

## 2019-09-21 DIAGNOSIS — R519 Headache, unspecified: Secondary | ICD-10-CM | POA: Diagnosis not present

## 2019-09-21 DIAGNOSIS — M47895 Other spondylosis, thoracolumbar region: Secondary | ICD-10-CM | POA: Diagnosis not present

## 2019-09-21 NOTE — Progress Notes (Signed)
Telehealth office visit note for Kelli [name], D.O- at Primary Care at [facility]   I connected with current patient today and verified that I am speaking with the correct person using two identifiers.   . Location of the patient: Home . Location of the provider: Office Only the patient (+/- their family members at pt's discretion) and myself were participating in the encounter - This visit type was conducted due to national recommendations for restrictions regarding the COVID-19 Pandemic (e.g. social distancing) in an effort to limit this patient's exposure and mitigate transmission in our community.  This format is felt to be most appropriate for this patient at this time.   - The patient did not have access to video technology or had technical difficulties with video requiring transitioning to audio format only. - No physical exam could be performed with this format, beyond that communicated to [place] by the patient/ family members as noted.   - Additionally my office staff/ schedulers discussed with the patient that there may be a monetary charge related to this service, depending on their medical insurance.   The patient expressed understanding, and agreed to proceed.       History of Present Illness: GI Problem   I, [name], am serving as scribe for Dr. Mellody [name].   - GI Symptoms & Sinus Headache States she's "not 100%, but feeling much better."  "I felt like it was sinuses; I had inflammation, I don't know whether it was an infection."  She was taking doTERRA to treat.  Her symptoms started last Thursday, almost a week ago.  Notes "the last month or so, I've had a lot of belching and gas and indigestion, and have had bouts of that before, but usually it would go away if I ate a little better."  This time her symptoms stayed and seemed to get worse.  Notes she had a little loose stool, "not really major diarrhea just loose for me."  Notes she had  diverticulitis years ago and was scared by her symptoms at first.  Her stomach was noisy along with the belching, and then she developed a "horrible headache around my forehead with pressure," and "under my eyes with pressure."  She felt the headache in her ears a bit, and back behind her neck.  States that this lasted until Monday.  Patient then called the clinic on Monday, January 4th.  She obtained the COVID-19 test; confirms a rapid COVID.  Says "I don't feel like it's COVID."  Notes she's been extra careful at work, keeping her mask on etc.  Denies fevers.  Says she "felt flushed," a "burning flushed feeling for a few minutes," and every time she felt this way, she would take her temperature and never had any.  Notes she continues feeling headache/sinus symptoms under her eyes, in the back of the head, and the forehead. Says "I fight taking tylenol and stuff; I probably need to take more than I do, but it's not so bad that I can't function and keep going.  It's actually better than it was."  "I'm not really stopped up, but I feel it down deep in there."  She has tried Tums for her stomach, "but that doesn't seem to be real effective."  Overall getting:  Yesterday she felt a little better, and today she felt a little better.  "Everything's a little better."   - Blood Pressure She went to the fire station on Monday  night to obtain an EKG, and had a high blood pressure reading and a normal oxygen reading.  Her BP was normal afterward on her home monitor.  She called the heart doctor for guidance and was told to keep a blood pressure log and follow-up as advised.  Her blood pressure was 127/76 this morning.  Says "I've been taking only a half of the water pill, and at first I was taking only a half of the losartan."  Current symptoms:  Denies tummy aches; notes her main concerns were the belching and gas more than normal, and didn't know what she could take OTC to help with this.  Notes ginger  ale seemed to help her stomach and did not irritate it.   - Cholesterol Management States that she tried to elevate her cholesterol medication dose for a couple of weeks, but her body couldn't handle it.  She is back to taking a half-tablet and trying to eat less red meat, and plans to exercise more and try to keep her cholesterol under control this way too.   Depression screen [facility] 2/9 09/21/2019 06/15/2019 02/28/2019 11/19/2018 08/09/2018  Decreased Interest 0 0 0 0 0  Down, Depressed, Hopeless 0 0 0 0 0  PHQ - 2 Score 0 0 0 0 0  Altered sleeping 0 0 0 0 0  Tired, decreased energy 1 1 0 1 0  Change in appetite 0 0 0 0 0  Feeling bad or failure about yourself  0 0 0 0 0  Trouble concentrating 0 0 0 0 0  Moving slowly or fidgety/restless 0 0 0 0 0  Suicidal thoughts 0 1 0 0 0  PHQ-9 Score 1 2 0 1 0  Difficult doing work/chores Not difficult at all Not difficult at all Not difficult at all Not difficult at all Not difficult at all  Some recent data might be hidden      Impression and Recommendations:    1. Loose stools- now resolving    2. Belching symptom   3. Gaseous regurgitation   4. Acute nonintractable headache, unspecified headache type- resolved   5. Essential hypertension   6. Mixed hyperlipidemia   7. High risk medication use   8. Counseled about COVID-19 virus infection   9. High priority for COVID-19 virus vaccination      Acute Nonintractable Headache - Resolved - Discussed patient's symptoms at length during appointment today. - Symptoms stable at this time.  - For relief, encouraged patient to use plain tylenol over the counter. - Patient knows to avoid use of ibuprofen, given her chronic kidney diease. - Education provided today regarding chronic kidney disease, including the importance of controlling blood sugar and blood pressure, engaging in physical activity, and consuming adequate hydration.  - To aid with sinus symptoms, advised the patient to begin using  AYR or Neilmed sinus rinses BID followed by flonase BID (one spray to each nostril).  Advised that the patient may also incorporate allegra, zyrtec, or claritin PRN.   - Discussed that if needed, patient may use cold & sinus medications designated for HBP patients, such as Coricidin.  - Will continue to monitor.  Patient will call back if symptoms fail to continue resolving.   Belching, Gaseous Regurgitation, Loose Stools - Now Resolving - Discussed patient's symptoms at length during appointment today. - Per patient, symptoms stable, resolving at this time.  - Told patient to avoid trigger foods that cause gas, such as beans, legumes, etc. - Encouraged patient  to take beano as-needed.  However, before use of medication to control symptoms, encouraged patient to avoid all legumes and gas-producing foods until her symptoms resolve.  - Extensive discussion held with patient regarding foods and beverages that contribute to gas. - Encouraged patient to adopt more prudent lifestyle habits such as incorporation of probiotics and eating more slowly to avoid swallowing air.  - Will continue to monitor.  Discussed that symptoms should improve over next several weeks.  If not, patient will call and let the clinic know for possible referral to gastroenterology.   Essential Hypertension - Discussed importance of continuing to control blood pressure to preserve kidney and overall organ health  - Blood pressure currently is stable, at goal. - Encouraged patient to continue all medication as prescribed.  See med list. - Patient will continue current treatment regimen and follow up with cardiology as scheduled.  - Counseled patient on pathophysiology of disease and discussed various treatment options, which always includes dietary and lifestyle modification as first line.   - Lifestyle changes such as dash and heart healthy diets and engaging in a regular exercise program discussed extensively with  patient.   - Ambulatory blood pressure monitoring encouraged at least 3 times weekly.  Keep log and bring in every office visit.  Reminded patient that if they ever feel poorly in any way, to check their blood pressure and pulse.  - We will continue to monitor.   Mixed Hyperlipidemia - Patient discontinued increased dose of statin. - Patient will continue half-tablet (10 mg) of Lipitor daily.  - Prudent dietary changes such as low saturated & trans fat diets for hyperlipidemia and low carb diets for hypertriglyceridemia discussed with patient.    - Encouraged patient to follow AHA guidelines for regular exercise and also engage in weight loss if BMI above 25.   - We will continue to monitor.   COVID-19 Counseling - Novel Covid -19 counseling done; all questions were answered.   - Current CDC / federal and [place] guidelines reviewed with patient  - Reminded pt of extreme importance of social distancing; wearing a mask when out in public; insensate handwashing and cleaning of surfaces, avoiding unnecessary trips for shopping and avoiding ALL but emergency appts etc. - Told patient to be prepared, not scared; and be smart for the sake of others - Patient will call with any additional concerns   High priority for COVID-19 virus vaccination - Told patient to consult with Health Department to obtain vaccine.   Recommendations - Return in April for BP, re-check FLP  - As part of my medical decision making, I reviewed the following data within the [place] History obtained from pt /family, CMA notes reviewed and incorporated if applicable, Labs reviewed, Radiograph/ tests reviewed if applicable and OV notes from prior OV's with me, as well as other specialists she/he has seen since seeing me last, were all reviewed and used in my medical decision making process today.    - Additionally, discussion had with patient regarding our treatment plan, and their  biases/concerns about that plan were used in my medical decision making today.    - The patient agreed with the plan and demonstrated an understanding of the instructions.   No barriers to understanding were identified.    - Red flag symptoms and signs discussed in detail.  Patient expressed understanding regarding what to do in case of emergency\ urgent symptoms.   - The patient was advised to call back or  seek an in-person evaluation if the symptoms worsen or if the condition fails to improve as anticipated.  Return for lab only re-check FLP, CMP, with OV DOXY/televisit 3-4 days later for HTN and lab review.   Medications Discontinued During This Encounter  Medication Reason  . apixaban (ELIQUIS) 5 MG TABS tablet Error  . apixaban (ELIQUIS) 5 MG TABS tablet Error    I provided 30 minutes of non face-to-face time during this encounter.  Additional time was spent with charting and coordination of care before and after the actual visit commenced.   Note:  This note was prepared with assistance of Dragon voice recognition software. Occasional wrong-word or sound-a-like substitutions may have occurred due to the inherent limitations of voice recognition software.  This document serves as a record of services personally performed by Kelli Dance, DO. It was created on her behalf by [name], a trained medical scribe. The creation of this record is based on the scribe's personal observations and the provider's statements to them.   This case required medical decision making of at least moderate complexity. The above documentation has been reviewed to be accurate and was completed by Marjory Sneddon, D.O.     Patient Care Team    Relationship Specialty Notifications Start End  Kelli Dance, DO PCP - General Family Medicine  11/02/17   Martinique, Peter M, MD Consulting Physician Cardiology  11/02/17   Allyn Kenner, MD Consulting Physician Dermatology  11/02/17    Comment: see's  them yrly- Dr Theda Sers is who she actually sees  Wonda Horner, MD Consulting Physician Gastroenterology  11/02/17      -Vitals obtained; medications/ allergies reconciled;  personal medical, social, Sx etc.histories were updated by CMA, reviewed by me and are reflected in chart   Patient Active Problem List   Diagnosis Date Noted  . PAD (peripheral artery disease) ([place]) 08/16/2018  . NSTEMI (non-ST elevated myocardial infarction) ([place]) 03/10/2013  . Atrial fibrillation with RVR ([place]) 03/09/2013  . Prediabetes 04/07/2018  . Mixed hyperlipidemia 11/02/2017  . Essential hypertension 03/10/2014  . Postmenopausal bone loss 11/19/2017  . Gastroesophageal reflux disease 11/02/2017  . Vitamin D deficiency 11/02/2017  . Chronic constipation 12/24/2013  . High risk medication use 09/21/2019  . Counseled about COVID-19 virus infection 09/21/2019  . High priority for COVID-19 virus vaccination 09/21/2019  . Osteopenia determined by x-ray 06/15/2019  . Hyperkalemia 04/07/2018  . Serum potassium elevated 04/07/2018  . Right knee pain 04/07/2018  . Bursitis of right knee 04/07/2018  . Osteoarthritis of knee 04/07/2018  . Allergic rhinitis due to pollen 11/02/2017  . Environmental and seasonal allergies 11/02/2017  . Perforated diverticulitis 12/24/2013  . Fatigue 08/18/2013  . Elevated troponin 03/09/2013     Current Meds  Medication Sig  . acetaminophen (TYLENOL) 500 MG tablet Take 500 mg by mouth daily as needed for pain.  [name] apixaban (ELIQUIS) 5 MG TABS tablet Take 5 mg by mouth 2 (two) times daily.  [name] atorvastatin (LIPITOR) 20 MG tablet Take 1 tablet (20 mg total) by mouth daily. (Patient taking differently: Take 10 mg by mouth daily. )  . Calcium Carb-Cholecalciferol (CALCIUM + D3) 600-200 MG-UNIT TABS Take 1 tablet by mouth daily.  . Cholecalciferol (VITAMIN D) 400 UNITS capsule Take 400 Units by mouth daily.  [name] Calcium (STOOL SOFTENER PO) Take by mouth at bedtime.  [name]  ELDERBERRY PO Take by mouth.  . FIBER ADULT GUMMIES PO Take 1 Dose by mouth daily.  . fish  oil-omega-3 fatty acids 1000 MG capsule Take 1 g by mouth daily.  . Flaxseed, Linseed, (FLAX SEED OIL PO) Take by mouth daily.  . hydrochlorothiazide (HYDRODIURIL) 12.5 MG tablet TAKE 1 TABLET BY MOUTH DAILY. (Patient taking differently: PATIENT STATES SHE TAKING HALF)  . losartan (COZAAR) 50 MG tablet TAKE 1 TABLET BY MOUTH DAILY.  . Magnesium 300 MG CAPS Take 1 capsule by mouth daily.  . metoprolol tartrate (LOPRESSOR) 50 MG tablet Take 1 tablet ( 50 mg ) in morning and take 1/2 tablet ( 25 mg ) in afternoon  . Probiotic Product (PROBIOTIC DAILY PO) Take 1 tablet by mouth daily.  . vitamin C (ASCORBIC ACID) 500 MG tablet Take 500 mg by mouth daily.  . Zinc 100 MG TABS Take by mouth.     Allergies:  Allergies  Allergen Reactions  . Augmentin [Amoxicillin-Pot Clavulanate] Itching and Rash     ROS:  See above HPI for pertinent positives and negatives   Objective:   Blood pressure 127/76, height 5\' 3"  (1.6 m), weight 168 lb (76.2 kg).  (if some vitals are omitted, this means that patient was UNABLE to obtain them even though they were asked to get them prior to OV today.  They were asked to call us at their earliest convenience with these once obtained. )  General: A & O * 3; sounds in no acute distress; in usual state of health.  Skin: Pt confirms warm and dry extremities and pink fingertips HEENT: Pt confirms lips non-cyanotic Chest: Patient confirms normal chest excursion and movement Respiratory: speaking in full sentences, no conversational dyspnea; patient confirms no use of accessory muscles Psych: insight appears good, mood- appears full

## 2019-10-17 DIAGNOSIS — M47895 Other spondylosis, thoracolumbar region: Secondary | ICD-10-CM | POA: Diagnosis not present

## 2019-10-17 DIAGNOSIS — M9904 Segmental and somatic dysfunction of sacral region: Secondary | ICD-10-CM | POA: Diagnosis not present

## 2019-10-17 DIAGNOSIS — M9901 Segmental and somatic dysfunction of cervical region: Secondary | ICD-10-CM | POA: Diagnosis not present

## 2019-10-17 DIAGNOSIS — M9902 Segmental and somatic dysfunction of thoracic region: Secondary | ICD-10-CM | POA: Diagnosis not present

## 2019-10-17 DIAGNOSIS — M4723 Other spondylosis with radiculopathy, cervicothoracic region: Secondary | ICD-10-CM | POA: Diagnosis not present

## 2019-10-22 ENCOUNTER — Other Ambulatory Visit: Payer: Self-pay | Admitting: Cardiology

## 2019-10-24 DIAGNOSIS — M4726 Other spondylosis with radiculopathy, lumbar region: Secondary | ICD-10-CM | POA: Diagnosis not present

## 2019-10-24 DIAGNOSIS — M9901 Segmental and somatic dysfunction of cervical region: Secondary | ICD-10-CM | POA: Diagnosis not present

## 2019-10-24 DIAGNOSIS — M9904 Segmental and somatic dysfunction of sacral region: Secondary | ICD-10-CM | POA: Diagnosis not present

## 2019-10-24 DIAGNOSIS — M9903 Segmental and somatic dysfunction of lumbar region: Secondary | ICD-10-CM | POA: Diagnosis not present

## 2019-10-24 DIAGNOSIS — M4723 Other spondylosis with radiculopathy, cervicothoracic region: Secondary | ICD-10-CM | POA: Diagnosis not present

## 2019-11-02 DIAGNOSIS — M4726 Other spondylosis with radiculopathy, lumbar region: Secondary | ICD-10-CM | POA: Diagnosis not present

## 2019-11-02 DIAGNOSIS — M9901 Segmental and somatic dysfunction of cervical region: Secondary | ICD-10-CM | POA: Diagnosis not present

## 2019-11-02 DIAGNOSIS — M4723 Other spondylosis with radiculopathy, cervicothoracic region: Secondary | ICD-10-CM | POA: Diagnosis not present

## 2019-11-02 DIAGNOSIS — M9903 Segmental and somatic dysfunction of lumbar region: Secondary | ICD-10-CM | POA: Diagnosis not present

## 2019-11-02 DIAGNOSIS — M9904 Segmental and somatic dysfunction of sacral region: Secondary | ICD-10-CM | POA: Diagnosis not present

## 2019-11-15 ENCOUNTER — Other Ambulatory Visit: Payer: Self-pay | Admitting: Family Medicine

## 2019-11-21 ENCOUNTER — Ambulatory Visit: Payer: Medicare Other | Admitting: Cardiology

## 2019-11-28 DIAGNOSIS — M4723 Other spondylosis with radiculopathy, cervicothoracic region: Secondary | ICD-10-CM | POA: Diagnosis not present

## 2019-11-28 DIAGNOSIS — M9903 Segmental and somatic dysfunction of lumbar region: Secondary | ICD-10-CM | POA: Diagnosis not present

## 2019-11-28 DIAGNOSIS — M4726 Other spondylosis with radiculopathy, lumbar region: Secondary | ICD-10-CM | POA: Diagnosis not present

## 2019-11-28 DIAGNOSIS — M9904 Segmental and somatic dysfunction of sacral region: Secondary | ICD-10-CM | POA: Diagnosis not present

## 2019-11-28 DIAGNOSIS — M9901 Segmental and somatic dysfunction of cervical region: Secondary | ICD-10-CM | POA: Diagnosis not present

## 2019-12-19 ENCOUNTER — Other Ambulatory Visit: Payer: Self-pay | Admitting: Family Medicine

## 2019-12-20 ENCOUNTER — Telehealth: Payer: Self-pay

## 2019-12-20 NOTE — Telephone Encounter (Signed)
Please call pt to schedule appt.  No further refills until pt is seen.  T. Doyne Ellinger, CMA  

## 2019-12-27 NOTE — Progress Notes (Signed)
Kelli [name] Date of Birth: 1941/07/19 Medical Record [id number]  History of Present Illness: Kelli [name] is seen for follow up of atrial fibrillation.  She has a history of HTN, paroxysmal Afib with RVR. Admitted in 2014. Had demand ischemia. Myoview study was normal.  Echo from Kelli [name] of 2014 with EF of 55 to 60% and grade 1 diastolic dysfunction. She has been on chronic therapy with metoprolol and Eliquis.  On follow up today she is doing well from a cardiac standpoint. She denies any chest pain, SOB. Notes a rare flutter that doesn't last long.  BP at home has been excellent. 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 She states she had an episode in February when she felt funny. Went to the fire station and states her BP was 200 but when she took it at home it was normal. Has been normal since. She tried taking a higher dose of lipitor but couldn't tolerate it due to myalgias.   Current Outpatient Medications  Medication Sig Dispense Refill  . acetaminophen (TYLENOL) 500 MG tablet Take 500 mg by mouth daily as needed for pain.    [name] apixaban (ELIQUIS) 5 MG TABS tablet Take 1 tablet (5 mg total) by mouth 2 (two) times daily. 180 tablet 3  . atorvastatin (LIPITOR) 20 MG tablet Take 1 tablet (20 mg total) by mouth daily. (Patient taking differently: Take 10 mg by mouth daily. ) 90 tablet 3  . Calcium Carb-Cholecalciferol (CALCIUM + D3) 600-200 MG-UNIT TABS Take 1 tablet by mouth daily.    . Cholecalciferol (VITAMIN D) 400 UNITS capsule Take 400 Units by mouth daily.    Kelli [name] Calcium (STOOL SOFTENER PO) Take by mouth at bedtime.    [name] ELDERBERRY PO Take by mouth.    . FIBER ADULT GUMMIES PO Take 1 Dose by mouth daily.    . fish oil-omega-3 fatty acids 1000 MG capsule Take 1 g by mouth daily.    . Flaxseed, Linseed, (FLAX SEED OIL PO) Take by mouth daily.    . fluticasone (FLONASE) 50 MCG/ACT nasal spray Place 1 spray into both nostrils daily as needed.     . hydrochlorothiazide  (HYDRODIURIL) 12.5 MG tablet Take 1 tablet (12.5 mg total) by mouth daily. 90 tablet 3  . losartan (COZAAR) 50 MG tablet Take 1 tablet (50 mg total) by mouth daily. 90 tablet 3  . Magnesium 300 MG CAPS Take 1 capsule by mouth daily.    . metoprolol tartrate (LOPRESSOR) 50 MG tablet TAKE 1 TABLET BY MOUTH IN THE MORNING AND TAKE 1/2 TABLET IN THE AFTERNOON. 135 tablet 1  . Multiple Vitamin (MULTIVITAMIN) capsule Take 1 capsule by mouth daily.    . Probiotic Product (PROBIOTIC DAILY PO) Take 1 tablet by mouth daily.    . vitamin C (ASCORBIC ACID) 500 MG tablet Take 500 mg by mouth daily.    . Zinc 100 MG TABS Take by mouth.     No current facility-administered medications for this visit.    Allergies  Allergen Reactions  . Augmentin [Amoxicillin-Pot Clavulanate] Itching and Rash    Past Medical History:  Diagnosis Date  . Atrial fibrillation ([place]) 02/2013   paroxsymal; c/b Type 2 NSTEMI; CHADS2-VASc=2 (age, sex - patient had come off anti-HTN meds in past); Eliquis started;  Echocardiogram 03/10/13: EF 0000000, grade 1 diastolic dysfunction, mild MR, mild LAE  . Hyperlipidemia   . Hypertension   . NSTEMI (non-ST elevated myocardial infarction) ([place]) 02/2013   demand  ischemia in setting of AFib with RVR; ETT-Myoview 03/11/13: No ischemia or scar, EF 70%.    Past Surgical History:  Procedure Laterality Date  . APPENDECTOMY    . CATARACT EXTRACTION, BILATERAL    . EYE SURGERY    . TONSILLECTOMY      Social History   Tobacco Use  Smoking Status Never Smoker  Smokeless Tobacco Never Used    Social History   Substance and Sexual Activity  Alcohol Use Yes   Comment: 1-2 glasses of wine occ.    Family History  Problem Relation Age of Onset  . Heart attack Mother 50  . Cancer Maternal Aunt        throat    Review of Systems: The review of systems is per the HPI.  All other systems were reviewed and are negative.  Physical Exam: BP 136/70   Pulse 78   Ht 5\' 3"  (1.6 m)    Wt 169 lb (76.7 kg)   SpO2 98%   BMI 29.94 kg/m  GENERAL:  Well appearing WF in NAD HEENT:  PERRL, EOMI, sclera are clear. Oropharynx is clear. NECK:  No jugular venous distention, carotid upstroke brisk and symmetric, no bruits, no thyromegaly or adenopathy LUNGS:  Clear to auscultation bilaterally CHEST:  Unremarkable HEART:  RRR,  PMI not displaced or sustained,S1 and S2 within normal limits, no S3, no S4: no clicks, no rubs, no murmurs ABD:  Soft, nontender. BS +, no masses or bruits. No hepatomegaly, no splenomegaly EXT:  2 + pulses throughout, no edema, no cyanosis no clubbing SKIN:  Warm and dry.  No rashes NEURO:  Alert and oriented x 3. Cranial nerves II through XII intact. PSYCH:  Cognitively intact      LABORATORY DATA:  Lab Results  Component Value Date   WBC 7.4 06/15/2019   HGB 14.3 06/15/2019   HCT 42.6 06/15/2019   PLT 262 06/15/2019   GLUCOSE 98 06/15/2019   CHOL 171 06/15/2019   TRIG 89 06/15/2019   HDL 49 06/15/2019   LDLCALC 105 (H) 06/15/2019   ALT 23 06/15/2019   AST 33 06/15/2019   NA 138 06/15/2019   K 5.2 06/15/2019   CL 99 06/15/2019   CREATININE 1.10 (H) 06/15/2019   BUN 20 06/15/2019   CO2 22 06/15/2019   TSH 1.460 06/15/2019   INR 1.19 01/03/2014   HGBA1C 5.9 (H) 06/15/2019   Labs dated 12/09/16: cholesterol 180, triglycerides 93, HDL 48, LDL 113. CMET normal.   Assessment / Plan: 1.  PAF - Maintaining NSR. Continue metoprolol and Eliquis.   2.  Chronic anticoagulation- tolerating well.  3. HTN - BP is well controlled.  4. HLD. Last LDL 105 on 10 mg lipitor. I think this is OK    I will follow up in 6 months

## 2019-12-29 ENCOUNTER — Other Ambulatory Visit: Payer: Self-pay

## 2019-12-29 ENCOUNTER — Ambulatory Visit: Payer: Medicare Other | Admitting: Cardiology

## 2019-12-29 ENCOUNTER — Telehealth: Payer: Self-pay | Admitting: Family Medicine

## 2019-12-29 ENCOUNTER — Encounter: Payer: Self-pay | Admitting: Cardiology

## 2019-12-29 VITALS — BP 136/70 | HR 78 | Ht 63.0 in | Wt 169.0 lb

## 2019-12-29 DIAGNOSIS — I10 Essential (primary) hypertension: Secondary | ICD-10-CM

## 2019-12-29 DIAGNOSIS — I480 Paroxysmal atrial fibrillation: Secondary | ICD-10-CM

## 2019-12-29 DIAGNOSIS — E7800 Pure hypercholesterolemia, unspecified: Secondary | ICD-10-CM | POA: Diagnosis not present

## 2019-12-29 MED ORDER — APIXABAN 5 MG PO TABS: 5.0000 mg | ORAL_TABLET | Freq: Two times a day (BID) | ORAL | 3 refills | Status: DC

## 2019-12-29 MED ORDER — HYDROCHLOROTHIAZIDE 12.5 MG PO TABS: 12.5000 mg | ORAL_TABLET | Freq: Every day | ORAL | 3 refills | Status: DC

## 2019-12-29 MED ORDER — LOSARTAN POTASSIUM 50 MG PO TABS: 50.0000 mg | ORAL_TABLET | Freq: Every day | ORAL | 3 refills | Status: DC

## 2019-12-29 NOTE — Telephone Encounter (Signed)
Left message for patient at her work to call back to schedule apt. Patient will call back after lunch AS,CMA

## 2019-12-29 NOTE — Telephone Encounter (Signed)
--  4/15   Called pt to set up require  appt for Rx refill--left message for her to call back --glh  Please call pt to schedule appt.  No further refills until pt is seen.  Charyl Bigger, CMA

## 2020-01-06 DIAGNOSIS — M9902 Segmental and somatic dysfunction of thoracic region: Secondary | ICD-10-CM | POA: Diagnosis not present

## 2020-01-06 DIAGNOSIS — M5388 Other specified dorsopathies, sacral and sacrococcygeal region: Secondary | ICD-10-CM | POA: Diagnosis not present

## 2020-01-06 DIAGNOSIS — M9903 Segmental and somatic dysfunction of lumbar region: Secondary | ICD-10-CM | POA: Diagnosis not present

## 2020-01-06 DIAGNOSIS — M4726 Other spondylosis with radiculopathy, lumbar region: Secondary | ICD-10-CM | POA: Diagnosis not present

## 2020-01-06 DIAGNOSIS — M9904 Segmental and somatic dysfunction of sacral region: Secondary | ICD-10-CM | POA: Diagnosis not present

## 2020-01-17 ENCOUNTER — Other Ambulatory Visit: Payer: Self-pay | Admitting: Family Medicine

## 2020-01-24 ENCOUNTER — Ambulatory Visit: Payer: Medicare Other | Admitting: Physician Assistant

## 2020-01-30 ENCOUNTER — Telehealth: Payer: Self-pay | Admitting: Physician Assistant

## 2020-01-30 DIAGNOSIS — E782 Mixed hyperlipidemia: Secondary | ICD-10-CM

## 2020-01-30 DIAGNOSIS — I739 Peripheral vascular disease, unspecified: Secondary | ICD-10-CM

## 2020-01-30 DIAGNOSIS — I10 Essential (primary) hypertension: Secondary | ICD-10-CM

## 2020-01-30 NOTE — Telephone Encounter (Signed)
Future labs ordered by Opals[facility] canceled. Future lab orders placed under [facility]. AS, CMA

## 2020-01-30 NOTE — Telephone Encounter (Signed)
Patient called to cancel appt for 5/18 reschedule for 5/27.[name]   --Pt's last AVS reads : (but there are no laba orders in pt's chart)   Return for lab only re-check FLP, CMP, with OV DOXY/televisit 3-4 days later for HTN and lab review.    --Forwarding message to med asst that OV rescheduled but see  (Labs are needed for appt review)--pls review chart & add orders so Lab Appt can be scheduled.  --glh

## 2020-02-01 ENCOUNTER — Ambulatory Visit: Payer: Medicare Other | Admitting: Physician Assistant

## 2020-02-06 DIAGNOSIS — M4726 Other spondylosis with radiculopathy, lumbar region: Secondary | ICD-10-CM | POA: Diagnosis not present

## 2020-02-06 DIAGNOSIS — M9902 Segmental and somatic dysfunction of thoracic region: Secondary | ICD-10-CM | POA: Diagnosis not present

## 2020-02-06 DIAGNOSIS — M5388 Other specified dorsopathies, sacral and sacrococcygeal region: Secondary | ICD-10-CM | POA: Diagnosis not present

## 2020-02-06 DIAGNOSIS — M9903 Segmental and somatic dysfunction of lumbar region: Secondary | ICD-10-CM | POA: Diagnosis not present

## 2020-02-06 DIAGNOSIS — M9904 Segmental and somatic dysfunction of sacral region: Secondary | ICD-10-CM | POA: Diagnosis not present

## 2020-02-09 ENCOUNTER — Other Ambulatory Visit: Payer: Self-pay

## 2020-02-09 ENCOUNTER — Encounter: Payer: Self-pay | Admitting: Physician Assistant

## 2020-02-09 ENCOUNTER — Ambulatory Visit (INDEPENDENT_AMBULATORY_CARE_PROVIDER_SITE_OTHER): Payer: Medicare Other | Admitting: Physician Assistant

## 2020-02-09 VITALS — BP 125/76 | HR 69 | Temp 98.0°F | Ht 63.0 in | Wt 168.5 lb

## 2020-02-09 DIAGNOSIS — I10 Essential (primary) hypertension: Secondary | ICD-10-CM

## 2020-02-09 DIAGNOSIS — M25562 Pain in left knee: Secondary | ICD-10-CM | POA: Diagnosis not present

## 2020-02-09 NOTE — Patient Instructions (Signed)
DASH Eating Plan DASH stands for "Dietary Approaches to Stop Hypertension." The DASH eating plan is a healthy eating plan that has been shown to reduce high blood pressure (hypertension). It may also reduce your risk for type 2 diabetes, heart disease, and stroke. The DASH eating plan may also help with weight loss. What are tips for following this plan?  General guidelines  Avoid eating more than 2,300 mg (milligrams) of salt (sodium) a day. If you have hypertension, you may need to reduce your sodium intake to 1,500 mg a day.  Limit alcohol intake to no more than 1 drink a day for nonpregnant women and 2 drinks a day for men. One drink equals 12 oz of beer, 5 oz of wine, or 1 oz of hard liquor.  Work with your health care provider to maintain a healthy body weight or to lose weight. Ask what an ideal weight is for you.  Get at least 30 minutes of exercise that causes your heart to beat faster (aerobic exercise) most days of the week. Activities may include walking, swimming, or biking.  Work with your health care provider or diet and nutrition specialist (dietitian) to adjust your eating plan to your individual calorie needs. Reading food labels   Check food labels for the amount of sodium per serving. Choose foods with less than 5 percent of the Daily Value of sodium. Generally, foods with less than 300 mg of sodium per serving fit into this eating plan.  To find whole grains, look for the word "whole" as the first word in the ingredient list. Shopping  Buy products labeled as "low-sodium" or "no salt added."  Buy fresh foods. Avoid canned foods and premade or frozen meals. Cooking  Avoid adding salt when cooking. Use salt-free seasonings or herbs instead of table salt or sea salt. Check with your health care provider or pharmacist before using salt substitutes.  Do not fry foods. Cook foods using healthy methods such as baking, boiling, grilling, and broiling instead.  Cook with  heart-healthy oils, such as olive, canola, soybean, or sunflower oil. Meal planning  Eat a balanced diet that includes: ? 5 or more servings of fruits and vegetables each day. At each meal, try to fill half of your plate with fruits and vegetables. ? Up to 6-8 servings of whole grains each day. ? Less than 6 oz of lean meat, poultry, or fish each day. A 3-oz serving of meat is about the same size as a deck of cards. One egg equals 1 oz. ? 2 servings of low-fat dairy each day. ? A serving of nuts, seeds, or beans 5 times each week. ? Heart-healthy fats. Healthy fats called Omega-3 fatty acids are found in foods such as flaxseeds and coldwater fish, like sardines, salmon, and mackerel.  Limit how much you eat of the following: ? Canned or prepackaged foods. ? Food that is high in trans fat, such as fried foods. ? Food that is high in saturated fat, such as fatty meat. ? Sweets, desserts, sugary drinks, and other foods with added sugar. ? Full-fat dairy products.  Do not salt foods before eating.  Try to eat at least 2 vegetarian meals each week.  Eat more home-cooked food and less restaurant, buffet, and fast food.  When eating at a restaurant, ask that your food be prepared with less salt or no salt, if possible. What foods are recommended? The items listed may not be a complete list. Talk with your dietitian about   what dietary choices are best for you. Grains Whole-grain or whole-wheat bread. Whole-grain or whole-wheat pasta. Brown rice. Oatmeal. Quinoa. Bulgur. Whole-grain and low-sodium cereals. Pita bread. Low-fat, low-sodium crackers. Whole-wheat flour tortillas. Vegetables Fresh or frozen vegetables (raw, steamed, roasted, or grilled). Low-sodium or reduced-sodium tomato and vegetable juice. Low-sodium or reduced-sodium tomato sauce and tomato paste. Low-sodium or reduced-sodium canned vegetables. Fruits All fresh, dried, or frozen fruit. Canned fruit in natural juice (without  added sugar). Meat and other protein foods Skinless chicken or turkey. Ground chicken or turkey. Pork with fat trimmed off. Fish and seafood. Egg whites. Dried beans, peas, or lentils. Unsalted nuts, nut butters, and seeds. Unsalted canned beans. Lean cuts of beef with fat trimmed off. Low-sodium, lean deli meat. Dairy Low-fat (1%) or fat-free (skim) milk. Fat-free, low-fat, or reduced-fat cheeses. Nonfat, low-sodium ricotta or cottage cheese. Low-fat or nonfat yogurt. Low-fat, low-sodium cheese. Fats and oils Soft margarine without trans fats. Vegetable oil. Low-fat, reduced-fat, or light mayonnaise and salad dressings (reduced-sodium). Canola, safflower, olive, soybean, and sunflower oils. Avocado. Seasoning and other foods Herbs. Spices. Seasoning mixes without salt. Unsalted popcorn and pretzels. Fat-free sweets. What foods are not recommended? The items listed may not be a complete list. Talk with your dietitian about what dietary choices are best for you. Grains Baked goods made with fat, such as croissants, muffins, or some breads. Dry pasta or rice meal packs. Vegetables Creamed or fried vegetables. Vegetables in a cheese sauce. Regular canned vegetables (not low-sodium or reduced-sodium). Regular canned tomato sauce and paste (not low-sodium or reduced-sodium). Regular tomato and vegetable juice (not low-sodium or reduced-sodium). Pickles. Olives. Fruits Canned fruit in a light or heavy syrup. Fried fruit. Fruit in cream or butter sauce. Meat and other protein foods Fatty cuts of meat. Ribs. Fried meat. Bacon. Sausage. Bologna and other processed lunch meats. Salami. Fatback. Hotdogs. Bratwurst. Salted nuts and seeds. Canned beans with added salt. Canned or smoked fish. Whole eggs or egg yolks. Chicken or turkey with skin. Dairy Whole or 2% milk, cream, and half-and-half. Whole or full-fat cream cheese. Whole-fat or sweetened yogurt. Full-fat cheese. Nondairy creamers. Whipped toppings.  Processed cheese and cheese spreads. Fats and oils Butter. Stick margarine. Lard. Shortening. Ghee. Bacon fat. Tropical oils, such as coconut, palm kernel, or palm oil. Seasoning and other foods Salted popcorn and pretzels. Onion salt, garlic salt, seasoned salt, table salt, and sea salt. Worcestershire sauce. Tartar sauce. Barbecue sauce. Teriyaki sauce. Soy sauce, including reduced-sodium. Steak sauce. Canned and packaged gravies. Fish sauce. Oyster sauce. Cocktail sauce. Horseradish that you find on the shelf. Ketchup. Mustard. Meat flavorings and tenderizers. Bouillon cubes. Hot sauce and Tabasco sauce. Premade or packaged marinades. Premade or packaged taco seasonings. Relishes. Regular salad dressings. Where to find more information:  National Heart, Lung, and Blood Institute: www.nhlbi.nih.gov  American Heart Association: www.heart.org Summary  The DASH eating plan is a healthy eating plan that has been shown to reduce high blood pressure (hypertension). It may also reduce your risk for type 2 diabetes, heart disease, and stroke.  With the DASH eating plan, you should limit salt (sodium) intake to 2,300 mg a day. If you have hypertension, you may need to reduce your sodium intake to 1,500 mg a day.  When on the DASH eating plan, aim to eat more fresh fruits and vegetables, whole grains, lean proteins, low-fat dairy, and heart-healthy fats.  Work with your health care provider or diet and nutrition specialist (dietitian) to adjust your eating plan to your   individual calorie needs. This information is not intended to replace advice given to you by your health care provider. Make sure you discuss any questions you have with your health care provider. Document Revised: 08/14/2017 Document Reviewed: 08/25/2016 Elsevier Patient Education  2020 Elsevier Inc.  

## 2020-02-09 NOTE — Progress Notes (Signed)
Established Patient Office Visit  Subjective:  Patient ID: Kelli [name], female    DOB: 06-05-1941  Age: 79 y.o. MRN: [id number]  CC:  Chief Complaint  Patient presents with  . Hypertension    HPI Kelli [name] presents for follow up on hypertension.   HTN: Pt denies chest pain, palpitations, dizziness or lower extremity swelling. Taking medication as directed without side effects. Checks BP at home occasionally since she has felt better.  Left knee pain: Pt states she has a flare-up of her left knee but is currently going to a chiropractor and her pain has improved.    Past Medical History:  Diagnosis Date  . Atrial fibrillation ([place]) 02/2013   paroxsymal; c/b Type 2 NSTEMI; CHADS2-VASc=2 (age, sex - patient had come off anti-HTN meds in past); Eliquis started;  Echocardiogram 03/10/13: EF 0000000, grade 1 diastolic dysfunction, mild MR, mild LAE  . Hyperlipidemia   . Hypertension   . NSTEMI (non-ST elevated myocardial infarction) ([place]) 02/2013   demand ischemia in setting of AFib with RVR; ETT-Myoview 03/11/13: No ischemia or scar, EF 70%.    Past Surgical History:  Procedure Laterality Date  . APPENDECTOMY    . CATARACT EXTRACTION, BILATERAL    . EYE SURGERY    . TONSILLECTOMY      Family History  Problem Relation Age of Onset  . Heart attack Mother 46  . Cancer Maternal Aunt        throat    Social History   Socioeconomic History  . Marital status: Widowed    Spouse name: Not on file  . Number of children: 3  . Years of education: Not on file  . Highest education level: Not on file  Occupational History  . Occupation: Surveyor, quantity- Day care    Employer: [employer]  Tobacco Use  . Smoking status: Never Smoker  . Smokeless tobacco: Never Used  Substance and Sexual Activity  . Alcohol use: Yes    Comment: 1-2 glasses of wine occ.  . Drug use: No  . Sexual activity: Not Currently  Other Topics Concern  . Not on file  Social History  Narrative  . Not on file   Social Determinants of Health   Financial Resource Strain:   . Difficulty of Paying Living Expenses:   Food Insecurity:   . Worried About Charity fundraiser in the Last Year:   . Arboriculturist in the Last Year:   Transportation Needs:   . Film/video editor (Medical):   [name] Lack of Transportation (Non-Medical):   Physical Activity:   . Days of Exercise per Week:   . Minutes of Exercise per Session:   Stress:   . Feeling of Stress :   Social Connections:   . Frequency of Communication with Friends and Family:   . Frequency of Social Gatherings with Friends and Family:   . Attends Religious Services:   . Active Member of Clubs or Organizations:   . Attends Archivist Meetings:   [name] Marital Status:   Intimate Partner Violence:   . Fear of Current or Ex-Partner:   . Emotionally Abused:   [name] Physically Abused:   . Sexually Abused:     Outpatient Medications Prior to Visit  Medication Sig Dispense Refill  . acetaminophen (TYLENOL) 500 MG tablet Take 500 mg by mouth daily as needed for pain.    [name] apixaban (ELIQUIS) 5 MG TABS tablet Take 1 tablet (5 mg  total) by mouth 2 (two) times daily. 180 tablet 3  . atorvastatin (LIPITOR) 10 MG tablet Take 10 mg by mouth daily.    . Calcium Carb-Cholecalciferol (CALCIUM + D3) 600-200 MG-UNIT TABS Take 1 tablet by mouth daily.    . Cholecalciferol (VITAMIN D) 400 UNITS capsule Take 400 Units by mouth daily.    [name] Calcium (STOOL SOFTENER PO) Take by mouth at bedtime.    [name] ELDERBERRY PO Take by mouth.    . FIBER ADULT GUMMIES PO Take 1 Dose by mouth daily.    . fish oil-omega-3 fatty acids 1000 MG capsule Take 1 g by mouth daily.    . Flaxseed, Linseed, (FLAX SEED OIL PO) Take by mouth daily.    . fluticasone (FLONASE) 50 MCG/ACT nasal spray Place 1 spray into both nostrils daily as needed.     . hydrochlorothiazide (HYDRODIURIL) 12.5 MG tablet Take 1 tablet (12.5 mg total) by mouth daily. 90  tablet 3  . losartan (COZAAR) 50 MG tablet Take 1 tablet (50 mg total) by mouth daily. 90 tablet 3  . Magnesium 300 MG CAPS Take 1 capsule by mouth daily.    . metoprolol tartrate (LOPRESSOR) 50 MG tablet TAKE 1 TABLET BY MOUTH IN THE MORNING AND TAKE 1/2 TABLET IN THE AFTERNOON. 135 tablet 1  . Multiple Vitamin (MULTIVITAMIN) capsule Take 1 capsule by mouth daily.    . Probiotic Product (PROBIOTIC DAILY PO) Take 1 tablet by mouth daily as needed.     . vitamin C (ASCORBIC ACID) 500 MG tablet Take 500 mg by mouth daily.    [name] atorvastatin (LIPITOR) 20 MG tablet Take 1 tablet (20 mg total) by mouth daily. (Patient taking differently: Take 10 mg by mouth daily. ) 90 tablet 3  . Zinc 100 MG TABS Take by mouth.     No facility-administered medications prior to visit.    Allergies  Allergen Reactions  . Augmentin [Amoxicillin-Pot Clavulanate] Itching and Rash    ROS Review of Systems  Review of Systems: General: Denies fever, chills, unexplained weight loss.  Optho/Auditory: Denies visual changes, blurred vision/LOV Respiratory: Denies SOB, DOE more than baseline levels.  Cardiovascular:  See HPI  Gastrointestinal: Denies nausea, vomiting, diarrhea.  Genitourinary: Denies dysuria, freq/ urgency, flank pain  Endocrine: Denies hot or cold intolerance, polyuria, polydipsia. Musculoskeletal: Denies joint swelling, gait problems.  Skin:  Denies rash, suspicious lesions Neurological:  Denies dizziness, unexplained weakness, numbness  Psychiatric/Behavioral:   Denies mood changes, suicidal or homicidal ideations, hallucinations    Objective:    Physical Exam  General: Well nourished, in no apparent distress. Eyes: PERRLA, EOMs, conjunctiva clr Resp: Respiratory effort- normal, ECTA B/L w/o W/R/R  Cardio: RRR w/o MRGs. Abdomen: no gross distention. Lymphatics:  less 2 sec cap RF M-sk: Full ROM,  normal gait.  Skin: Warm, dry  Neuro: Alert, Oriented Psych: Normal affect, Insight and  Judgment appropriate. Euthymic mood.  BP 125/76   Pulse 69   Temp 98 F (36.7 C) (Oral)   Ht 5\' 3"  (1.6 m)   Wt 168 lb 8 oz (76.4 kg)   SpO2 98% Comment: on RA  BMI 29.85 kg/m  Wt Readings from Last 3 Encounters:  02/09/20 168 lb 8 oz (76.4 kg)  12/29/19 169 lb (76.7 kg)  09/21/19 168 lb (76.2 kg)     Health Maintenance Due  Topic Date Due  . COVID-19 Vaccine (1) Never done  . PNA vac Low Risk Adult (2 of 2 - PCV13)  06/12/2010    There are no preventive care reminders to display for this patient.  Lab Results  Component Value Date   TSH 1.460 06/15/2019   Lab Results  Component Value Date   WBC 7.4 06/15/2019   HGB 14.3 06/15/2019   HCT 42.6 06/15/2019   MCV 91 06/15/2019   PLT 262 06/15/2019   Lab Results  Component Value Date   NA 138 06/15/2019   K 5.2 06/15/2019   CO2 22 06/15/2019   GLUCOSE 98 06/15/2019   BUN 20 06/15/2019   CREATININE 1.10 (H) 06/15/2019   BILITOT 0.6 06/15/2019   ALKPHOS 71 06/15/2019   AST 33 06/15/2019   ALT 23 06/15/2019   PROT 7.4 06/15/2019   ALBUMIN 4.4 06/15/2019   CALCIUM 9.6 06/15/2019   GFR 59.85 (L) 05/11/2013   Lab Results  Component Value Date   CHOL 171 06/15/2019   Lab Results  Component Value Date   HDL 49 06/15/2019   Lab Results  Component Value Date   LDLCALC 105 (H) 06/15/2019   Lab Results  Component Value Date   TRIG 89 06/15/2019   Lab Results  Component Value Date   CHOLHDL 3.5 06/15/2019   Lab Results  Component Value Date   HGBA1C 5.9 (H) 06/15/2019      Assessment & Plan:   Problem List Items Addressed This Visit      Cardiovascular and Mediastinum   Essential hypertension - Primary   Relevant Medications   atorvastatin (LIPITOR) 10 MG tablet    Other Visit Diagnoses    Acute pain of left knee         HTN: - BP today is 125/76, at goal. - Continue Hydrochlorothiazide and Losartan - Encourage ambulatory BP and pulse monitoring, especially when not feeling well. - Follow  DASH diet. - Encourage to stay as active as possible.  Left Knee pain:  - Improving with chiropractor sessions. - Advise to use topical antiinflammatory such as Voltaren gel as needed. - If symptoms fail to improve or worsen RTC.  No orders of the defined types were placed in this encounter.   Follow-up: Return in about 4 months (around 06/11/2020) for MCW and FBW 1 wk prior.    Lorrene Reid, PA-C

## 2020-02-22 DIAGNOSIS — M5388 Other specified dorsopathies, sacral and sacrococcygeal region: Secondary | ICD-10-CM | POA: Diagnosis not present

## 2020-02-22 DIAGNOSIS — M9902 Segmental and somatic dysfunction of thoracic region: Secondary | ICD-10-CM | POA: Diagnosis not present

## 2020-02-22 DIAGNOSIS — M9903 Segmental and somatic dysfunction of lumbar region: Secondary | ICD-10-CM | POA: Diagnosis not present

## 2020-02-22 DIAGNOSIS — M9904 Segmental and somatic dysfunction of sacral region: Secondary | ICD-10-CM | POA: Diagnosis not present

## 2020-02-22 DIAGNOSIS — M4726 Other spondylosis with radiculopathy, lumbar region: Secondary | ICD-10-CM | POA: Diagnosis not present

## 2020-03-05 DIAGNOSIS — M9903 Segmental and somatic dysfunction of lumbar region: Secondary | ICD-10-CM | POA: Diagnosis not present

## 2020-03-05 DIAGNOSIS — M9904 Segmental and somatic dysfunction of sacral region: Secondary | ICD-10-CM | POA: Diagnosis not present

## 2020-03-05 DIAGNOSIS — M9902 Segmental and somatic dysfunction of thoracic region: Secondary | ICD-10-CM | POA: Diagnosis not present

## 2020-03-05 DIAGNOSIS — M5388 Other specified dorsopathies, sacral and sacrococcygeal region: Secondary | ICD-10-CM | POA: Diagnosis not present

## 2020-03-05 DIAGNOSIS — M4726 Other spondylosis with radiculopathy, lumbar region: Secondary | ICD-10-CM | POA: Diagnosis not present

## 2020-03-26 DIAGNOSIS — M9902 Segmental and somatic dysfunction of thoracic region: Secondary | ICD-10-CM | POA: Diagnosis not present

## 2020-03-26 DIAGNOSIS — M5388 Other specified dorsopathies, sacral and sacrococcygeal region: Secondary | ICD-10-CM | POA: Diagnosis not present

## 2020-03-26 DIAGNOSIS — M9903 Segmental and somatic dysfunction of lumbar region: Secondary | ICD-10-CM | POA: Diagnosis not present

## 2020-03-26 DIAGNOSIS — M4726 Other spondylosis with radiculopathy, lumbar region: Secondary | ICD-10-CM | POA: Diagnosis not present

## 2020-03-26 DIAGNOSIS — M9904 Segmental and somatic dysfunction of sacral region: Secondary | ICD-10-CM | POA: Diagnosis not present

## 2020-04-23 DIAGNOSIS — M5388 Other specified dorsopathies, sacral and sacrococcygeal region: Secondary | ICD-10-CM | POA: Diagnosis not present

## 2020-04-23 DIAGNOSIS — M9903 Segmental and somatic dysfunction of lumbar region: Secondary | ICD-10-CM | POA: Diagnosis not present

## 2020-04-23 DIAGNOSIS — M9902 Segmental and somatic dysfunction of thoracic region: Secondary | ICD-10-CM | POA: Diagnosis not present

## 2020-04-23 DIAGNOSIS — M9904 Segmental and somatic dysfunction of sacral region: Secondary | ICD-10-CM | POA: Diagnosis not present

## 2020-04-23 DIAGNOSIS — M4726 Other spondylosis with radiculopathy, lumbar region: Secondary | ICD-10-CM | POA: Diagnosis not present

## 2020-04-26 ENCOUNTER — Telehealth: Payer: Self-pay | Admitting: *Deleted

## 2020-04-26 NOTE — Telephone Encounter (Signed)
A detailed message was left, re: her follow up visit. 

## 2020-05-02 ENCOUNTER — Other Ambulatory Visit: Payer: Self-pay | Admitting: Cardiology

## 2020-05-30 DIAGNOSIS — M5388 Other specified dorsopathies, sacral and sacrococcygeal region: Secondary | ICD-10-CM | POA: Diagnosis not present

## 2020-05-30 DIAGNOSIS — M9905 Segmental and somatic dysfunction of pelvic region: Secondary | ICD-10-CM | POA: Diagnosis not present

## 2020-05-30 DIAGNOSIS — M9903 Segmental and somatic dysfunction of lumbar region: Secondary | ICD-10-CM | POA: Diagnosis not present

## 2020-05-30 DIAGNOSIS — M9904 Segmental and somatic dysfunction of sacral region: Secondary | ICD-10-CM | POA: Diagnosis not present

## 2020-05-30 DIAGNOSIS — M4727 Other spondylosis with radiculopathy, lumbosacral region: Secondary | ICD-10-CM | POA: Diagnosis not present

## 2020-06-12 ENCOUNTER — Other Ambulatory Visit: Payer: Self-pay | Admitting: Physician Assistant

## 2020-06-12 DIAGNOSIS — E782 Mixed hyperlipidemia: Secondary | ICD-10-CM

## 2020-06-12 DIAGNOSIS — I10 Essential (primary) hypertension: Secondary | ICD-10-CM

## 2020-06-12 DIAGNOSIS — Z0000 Encounter for general adult medical examination without abnormal findings: Secondary | ICD-10-CM

## 2020-06-12 DIAGNOSIS — R7303 Prediabetes: Secondary | ICD-10-CM

## 2020-06-12 DIAGNOSIS — E559 Vitamin D deficiency, unspecified: Secondary | ICD-10-CM

## 2020-06-15 ENCOUNTER — Other Ambulatory Visit: Payer: Self-pay

## 2020-06-15 ENCOUNTER — Other Ambulatory Visit: Payer: Medicare Other

## 2020-06-15 DIAGNOSIS — E559 Vitamin D deficiency, unspecified: Secondary | ICD-10-CM

## 2020-06-15 DIAGNOSIS — E782 Mixed hyperlipidemia: Secondary | ICD-10-CM

## 2020-06-15 DIAGNOSIS — R7303 Prediabetes: Secondary | ICD-10-CM | POA: Diagnosis not present

## 2020-06-15 DIAGNOSIS — Z0000 Encounter for general adult medical examination without abnormal findings: Secondary | ICD-10-CM

## 2020-06-15 DIAGNOSIS — I10 Essential (primary) hypertension: Secondary | ICD-10-CM

## 2020-06-16 LAB — CBC
Hematocrit: 40.1 % (ref 34.0–46.6)
Hemoglobin: 13.4 g/dL (ref 11.1–15.9)
MCH: 31.2 pg (ref 26.6–33.0)
MCHC: 33.4 g/dL (ref 31.5–35.7)
MCV: 94 fL (ref 79–97)
Platelets: 271 10*3/uL (ref 150–450)
RBC: 4.29 x10E6/uL (ref 3.77–5.28)
RDW: 12.7 % (ref 11.7–15.4)
WBC: 7.5 10*3/uL (ref 3.4–10.8)

## 2020-06-16 LAB — LIPID PANEL
Chol/HDL Ratio: 3.6 ratio (ref 0.0–4.4)
Cholesterol, Total: 171 mg/dL (ref 100–199)
HDL: 48 mg/dL (ref >39)
LDL Chol Calc (NIH): 107 mg/dL — ABNORMAL HIGH (ref 0–99)
Triglycerides: 88 mg/dL (ref 0–149)
VLDL Cholesterol Cal: 16 mg/dL (ref 5–40)

## 2020-06-16 LAB — COMPREHENSIVE METABOLIC PANEL
ALT: 16 IU/L (ref 0–32)
AST: 28 IU/L (ref 0–40)
Albumin/Globulin Ratio: 1.8 (ref 1.2–2.2)
Albumin: 4.3 g/dL (ref 3.7–4.7)
Alkaline Phosphatase: 68 IU/L (ref 44–121)
BUN/Creatinine Ratio: 20 (ref 12–28)
BUN: 26 mg/dL (ref 8–27)
Bilirubin Total: 0.6 mg/dL (ref 0.0–1.2)
CO2: 26 mmol/L (ref 20–29)
Calcium: 9.4 mg/dL (ref 8.7–10.3)
Chloride: 100 mmol/L (ref 96–106)
Creatinine, Ser: 1.29 mg/dL — ABNORMAL HIGH (ref 0.57–1.00)
GFR calc Af Amer: 46 mL/min/{1.73_m2} — ABNORMAL LOW (ref >59)
GFR calc non Af Amer: 39 mL/min/{1.73_m2} — ABNORMAL LOW (ref >59)
Globulin, Total: 2.4 g/dL (ref 1.5–4.5)
Glucose: 87 mg/dL (ref 65–99)
Potassium: 4.7 mmol/L (ref 3.5–5.2)
Sodium: 138 mmol/L (ref 134–144)
Total Protein: 6.7 g/dL (ref 6.0–8.5)

## 2020-06-16 LAB — VITAMIN D 25 HYDROXY (VIT D DEFICIENCY, FRACTURES): Vit D, 25-Hydroxy: 59.4 ng/mL (ref 30.0–100.0)

## 2020-06-16 LAB — TSH: TSH: 1.91 u[IU]/mL (ref 0.450–4.500)

## 2020-06-16 LAB — HEMOGLOBIN A1C
Est. average glucose Bld gHb Est-mCnc: 117 mg/dL
Hgb A1c MFr Bld: 5.7 % — ABNORMAL HIGH (ref 4.8–5.6)

## 2020-06-21 ENCOUNTER — Ambulatory Visit: Payer: Medicare Other | Admitting: Physician Assistant

## 2020-06-29 DIAGNOSIS — M9903 Segmental and somatic dysfunction of lumbar region: Secondary | ICD-10-CM | POA: Diagnosis not present

## 2020-06-29 DIAGNOSIS — M9905 Segmental and somatic dysfunction of pelvic region: Secondary | ICD-10-CM | POA: Diagnosis not present

## 2020-06-29 DIAGNOSIS — M5388 Other specified dorsopathies, sacral and sacrococcygeal region: Secondary | ICD-10-CM | POA: Diagnosis not present

## 2020-06-29 DIAGNOSIS — M9904 Segmental and somatic dysfunction of sacral region: Secondary | ICD-10-CM | POA: Diagnosis not present

## 2020-06-29 DIAGNOSIS — M4727 Other spondylosis with radiculopathy, lumbosacral region: Secondary | ICD-10-CM | POA: Diagnosis not present

## 2020-06-30 DIAGNOSIS — Z20822 Contact with and (suspected) exposure to covid-19: Secondary | ICD-10-CM | POA: Diagnosis not present

## 2020-07-05 ENCOUNTER — Other Ambulatory Visit: Payer: Self-pay

## 2020-07-05 ENCOUNTER — Encounter: Payer: Self-pay | Admitting: Physician Assistant

## 2020-07-05 ENCOUNTER — Ambulatory Visit (INDEPENDENT_AMBULATORY_CARE_PROVIDER_SITE_OTHER): Payer: Medicare Other | Admitting: Physician Assistant

## 2020-07-05 VITALS — BP 129/72 | HR 72 | Ht 63.0 in | Wt 168.0 lb

## 2020-07-05 DIAGNOSIS — Z0000 Encounter for general adult medical examination without abnormal findings: Secondary | ICD-10-CM

## 2020-07-05 NOTE — Progress Notes (Signed)
Virtual Visit via Telephone Note:  I connected with [name] by telephone and verified that I am speaking with the correct person using two identifiers.    I discussed the limitations, risks, security and privacy concerns for performing an evaluation and management service by telephone and the availability of in person appointments. The staff discussed with patient that there may be a patient responsible charge related to this service. The patient expressed understanding and agreed to proceed.   Location of Patient- Home Location of Provider- Office    Subjective:   Kelli [name] is a 79 y.o. female who presents for Medicare Annual (Subsequent) preventive examination.  Review of Systems    General:   No F/C, wt loss Pulm:   No DIB, SOB, pleuritic chest pain Card:  No CP, palpitations Abd:  No n/v/d or pain Ext:  No inc edema from baseline     Objective:    Today's Vitals   07/05/20 1329  BP: 129/72  Pulse: 72  Weight: 168 lb (76.2 kg)  Height: 5\' 3"  (1.6 m)   Body mass index is 29.76 kg/m.  Advanced Directives 06/15/2019 12/24/2013 03/09/2013  Does Patient Have a Medical Advance Directive? Yes Patient has advance directive, copy not in chart Patient has advance directive, copy not in chart  Type of Advance Directive - Living will Living will;Healthcare Power of Attorney  Does patient want to make changes to medical advance directive? - No change requested -  Copy of Kelli [name] in Chart? - Copy requested from family Copy requested from family  Pre-existing out of facility DNR order (yellow form or pink MOST form) - No -    Current Medications (verified) Outpatient Encounter Medications as of 07/05/2020  Medication Sig  . acetaminophen (TYLENOL) 500 MG tablet Take 500 mg by mouth daily as needed for pain.  [name] apixaban (ELIQUIS) 5 MG TABS tablet Take 1 tablet (5 mg total) by mouth 2 (two) times daily.  [name] atorvastatin (LIPITOR) 10 MG tablet Take 5 mg by  mouth daily.   . Calcium Carb-Cholecalciferol (CALCIUM + D3) 600-200 MG-UNIT TABS Take 1 tablet by mouth daily.  . Cholecalciferol (VITAMIN D) 400 UNITS capsule Take 400 Units by mouth daily.  [name] Calcium (STOOL SOFTENER PO) Take by mouth at bedtime.  [name] ELDERBERRY PO Take by mouth.  . FIBER ADULT GUMMIES PO Take 1 Dose by mouth daily.  . fish oil-omega-3 fatty acids 1000 MG capsule Take 1 g by mouth daily.  . Flaxseed, Linseed, (FLAX SEED OIL PO) Take by mouth daily.  . fluticasone (FLONASE) 50 MCG/ACT nasal spray Place 1 spray into both nostrils daily as needed.   . hydrochlorothiazide (HYDRODIURIL) 12.5 MG tablet Take 1 tablet (12.5 mg total) by mouth daily.  [name] losartan (COZAAR) 50 MG tablet Take 1 tablet (50 mg total) by mouth daily.  . Magnesium 300 MG CAPS Take 1 capsule by mouth daily.  . metoprolol tartrate (LOPRESSOR) 50 MG tablet TAKE 1 TABLET BY MOUTH IN THE MORNING AND TAKE 1/2 TABLET IN THE AFTERNOON.  . Probiotic Product (PROBIOTIC DAILY PO) Take 1 tablet by mouth daily as needed.   . vitamin C (ASCORBIC ACID) 500 MG tablet Take 500 mg by mouth daily.  . [DISCONTINUED] Multiple Vitamin (MULTIVITAMIN) capsule Take 1 capsule by mouth daily.   No facility-administered encounter medications on file as of 07/05/2020.    Allergies (verified) Amoxicillin and Augmentin [amoxicillin-pot clavulanate]   History: Past Medical History:  Diagnosis Date  . Allergy    Phreesia 07/04/2020  . Arthritis    Phreesia 07/04/2020  . Atrial fibrillation ([place]) 02/2013   paroxsymal; c/b Type 2 NSTEMI; CHADS2-VASc=2 (age, sex - patient had come off anti-HTN meds in past); Eliquis started;  Echocardiogram 03/10/13: EF 77-41%, grade 1 diastolic dysfunction, mild MR, mild LAE  . GERD (gastroesophageal reflux disease)    Phreesia 07/04/2020  . Hyperlipidemia   . Hypertension   . Myocardial infarction ([place])    Phreesia 07/04/2020  . NSTEMI (non-ST elevated myocardial infarction) ([place]) 02/2013    demand ischemia in setting of AFib with RVR; ETT-Myoview 03/11/13: No ischemia or scar, EF 70%.   Past Surgical History:  Procedure Laterality Date  . APPENDECTOMY    . CATARACT EXTRACTION, BILATERAL    . EYE SURGERY    . TONSILLECTOMY     Family History  Problem Relation Age of Onset  . Heart attack Mother 61  . Cancer Maternal Aunt        throat   Social History   Socioeconomic History  . Marital status: Widowed    Spouse name: Not on file  . Number of children: 3  . Years of education: Not on file  . Highest education level: Not on file  Occupational History  . Occupation: Surveyor, quantity- Day care    Employer: [employer]  Tobacco Use  . Smoking status: Never Smoker  . Smokeless tobacco: Never Used  Vaping Use  . Vaping Use: Never used  Substance and Sexual Activity  . Alcohol use: Yes    Comment: 1-2 glasses of wine occ.  . Drug use: No  . Sexual activity: Not Currently  Other Topics Concern  . Not on file  Social History Narrative  . Not on file   Social Determinants of Health   Financial Resource Strain:   . Difficulty of Paying Living Expenses: Not on file  Food Insecurity:   . Worried About Charity fundraiser in the Last Year: Not on file  . Ran Out of Food in the Last Year: Not on file  Transportation Needs:   . Lack of Transportation (Medical): Not on file  . Lack of Transportation (Non-Medical): Not on file  Physical Activity:   . Days of Exercise per Week: Not on file  . Minutes of Exercise per Session: Not on file  Stress:   . Feeling of Stress : Not on file  Social Connections:   . Frequency of Communication with Friends and Family: Not on file  . Frequency of Social Gatherings with Friends and Family: Not on file  . Attends Religious Services: Not on file  . Active Member of Clubs or Organizations: Not on file  . Attends Archivist Meetings: Not on file  . Marital Status: Not on file    Tobacco  Counseling Counseling given: Not Answered    Diabetic? no         Activities of Daily Living In your present state of health, do you have any difficulty performing the following activities: 07/05/2020  Hearing? N  Vision? N  Difficulty concentrating or making decisions? N  Walking or climbing stairs? N  Dressing or bathing? N  Doing errands, shopping? N  Some recent data might be hidden    Patient Care Team: Lorrene Reid, PA-C as PCP - General Martinique, Peter M, MD as Consulting Physician (Cardiology) Allyn Kenner, MD as Consulting Physician (Dermatology) Wonda Horner, MD as Consulting Physician (Gastroenterology)  Care, [facility]  Indicate any recent Medical Services you may have received from other than [facility] providers in the past year (date may be approximate).     Assessment:   This is a routine wellness examination for Shaneequa.  Hearing/Vision screen No exam data present  Dietary issues and exercise activities discussed:  -Follow a heart healthy diet and continue to stay as active as possible. Stay well hydrated.  Goals   None    Depression Screen PHQ 2/9 Scores 07/05/2020 02/09/2020 09/21/2019 06/15/2019 02/28/2019 11/19/2018 08/09/2018  PHQ - 2 Score 0 0 0 0 0 0 0  PHQ- 9 Score 1 0 1 2 0 1 0    Fall Risk Fall Risk  07/05/2020 06/15/2019 08/09/2018 04/07/2018 12/22/2017  Falls in the past year? 0 1 0 No No  Number falls in past yr: - 0 - - -  Injury with Fall? - 1 - - -  Risk for fall due to : No Fall Risks - - - -  Follow up Falls evaluation completed - - - -    Any stairs in or around the home? No  If so, are there any without handrails? N/A Home free of loose throw rugs in walkways, pet beds, electrical cords, etc? No  Adequate lighting in your home to reduce risk of falls? Yes   ASSISTIVE DEVICES UTILIZED TO PREVENT FALLS:  Life alert? No  Use of a cane, walker or w/c? No  Grab bars in the bathroom? Yes  Shower chair or bench in shower? No   Elevated toilet seat or a handicapped toilet? No   TIMED UP AND GO:  Was the test performed? No .  Visit performed telphonically   Cognitive Function:     6CIT Screen 07/05/2020 06/15/2019 12/22/2017  What Year? 0 points 0 points 0 points  What month? 0 points 0 points 0 points  What time? 0 points 0 points 0 points  Count back from 20 0 points 0 points 0 points  Months in reverse 0 points 0 points 0 points  Repeat phrase 0 points 0 points 0 points  Total Score 0 0 0    Immunizations Immunization History  Administered Date(s) Administered  . Fluad Quad(high Dose 65+) 08/16/2019  . Influenza, High Dose Seasonal PF 08/09/2018  . Moderna SARS-COVID-2 Vaccination 10/22/2019, 11/08/2019  . Pneumococcal Polysaccharide-23 06/12/2009  . Tdap 07/16/2011    TDAP status: Up to date Flu Vaccine status: Declined, Education has been provided regarding the importance of this vaccine but patient still declined. Advised may receive this vaccine at local pharmacy or Health Dept. Aware to provide a copy of the vaccination record if obtained from local pharmacy or Health Dept. Verbalized acceptance and understanding. Pneumococcal vaccine status: Declined,  Education has been provided regarding the importance of this vaccine but patient still declined. Advised may receive this vaccine at local pharmacy or Health Dept. Aware to provide a copy of the vaccination record if obtained from local pharmacy or Health Dept. Verbalized acceptance and understanding.  Covid-19 vaccine status: Completed vaccines  Qualifies for Shingles Vaccine? Yes   Zostavax completed No   Shingrix Completed?: No.    Education has been provided regarding the importance of this vaccine. Patient has been advised to call insurance company to determine out of pocket expense if they have not yet received this vaccine. Advised may also receive vaccine at local pharmacy or Health Dept. Verbalized acceptance and  understanding.  Screening Tests Health Maintenance  Topic Date  Due  . PNA vac Low Risk Adult (2 of 2 - PCV13) 06/12/2010  . INFLUENZA VACCINE  04/15/2020  . TETANUS/TDAP  07/15/2021  . DEXA SCAN  Completed  . COVID-19 Vaccine  Completed    Health Maintenance  Health Maintenance Due  Topic Date Due  . PNA vac Low Risk Adult (2 of 2 - PCV13) 06/12/2010  . INFLUENZA VACCINE  04/15/2020    Colorectal cancer screening: No longer required.  Mammogram status: No longer required.  Bone Density status: Completed 11/22/2018. Results reflect: Bone density results: OSTEOPENIA. Repeat every 2 years.  Lung Cancer Screening: (Low Dose CT Chest recommended if Age 34-80 years, 30 pack-year currently smoking OR have quit w/in 15years.) does not qualify.     Additional Screening:  Hepatitis C Screening: does qualify; Completed  Pt declined  Vision Screening: Recommended annual ophthalmology exams for early detection of glaucoma and other disorders of the eye. Is the patient up to date with their annual eye exam?  Yes  Who is the provider or what is the name of the office in which the patient attends annual eye exams? Dr. Sabra Heck with [facility]  Dental Screening: Recommended annual dental exams for proper oral hygiene  Community Resource Referral / Chronic Care Management: CRR required this visit?  No   CCM required this visit?  No      Plan:  -Most labs recently obtained are essentially within normal limits or stable from prior.  Renal function declined from prior and advised to avoid NSAIDs such as ibuprofen and continue Tylenol as needed for pain. If renal function continues to decline will consider discontinuing HCTZ (dicuss with cardiology). -Continue current medication regimen. -Follow up in 4-5 months for reg OV: HTN, HLD  I have personally reviewed and noted the following in the patient's chart:   . Medical and social history . Use of alcohol, tobacco or illicit drugs   . Current medications and supplements . Functional ability and status . Nutritional status . Physical activity . Advanced directives . List of other physicians . Hospitalizations, surgeries, and ER visits in previous 12 months . Vitals . Screenings to include cognitive, depression, and falls . Referrals and appointments  In addition, I have reviewed and discussed with patient certain preventive protocols, quality metrics, and best practice recommendations. A written personalized care plan for preventive services as well as general preventive health recommendations were provided to patient.        07/05/2020

## 2020-07-30 ENCOUNTER — Ambulatory Visit: Payer: Medicare Other | Admitting: Cardiology

## 2020-08-14 DIAGNOSIS — M4727 Other spondylosis with radiculopathy, lumbosacral region: Secondary | ICD-10-CM | POA: Diagnosis not present

## 2020-08-14 DIAGNOSIS — M9905 Segmental and somatic dysfunction of pelvic region: Secondary | ICD-10-CM | POA: Diagnosis not present

## 2020-08-14 DIAGNOSIS — M9903 Segmental and somatic dysfunction of lumbar region: Secondary | ICD-10-CM | POA: Diagnosis not present

## 2020-08-14 DIAGNOSIS — M9904 Segmental and somatic dysfunction of sacral region: Secondary | ICD-10-CM | POA: Diagnosis not present

## 2020-08-14 DIAGNOSIS — M5388 Other specified dorsopathies, sacral and sacrococcygeal region: Secondary | ICD-10-CM | POA: Diagnosis not present

## 2020-08-30 ENCOUNTER — Ambulatory Visit: Payer: Medicare Other | Admitting: Physician Assistant

## 2020-09-03 ENCOUNTER — Ambulatory Visit: Payer: Medicare Other | Admitting: Physician Assistant

## 2020-09-03 ENCOUNTER — Other Ambulatory Visit: Payer: Self-pay

## 2020-09-03 ENCOUNTER — Encounter: Payer: Self-pay | Admitting: Physician Assistant

## 2020-09-03 VITALS — BP 140/71 | HR 60 | Temp 93.9°F | Ht 63.0 in | Wt 167.8 lb

## 2020-09-03 DIAGNOSIS — R7303 Prediabetes: Secondary | ICD-10-CM | POA: Diagnosis not present

## 2020-09-03 DIAGNOSIS — I480 Paroxysmal atrial fibrillation: Secondary | ICD-10-CM

## 2020-09-03 DIAGNOSIS — I10 Essential (primary) hypertension: Secondary | ICD-10-CM | POA: Diagnosis not present

## 2020-09-03 DIAGNOSIS — E785 Hyperlipidemia, unspecified: Secondary | ICD-10-CM

## 2020-09-03 NOTE — Patient Instructions (Signed)
Medication Instructions:  No changes *If you need a refill on your cardiac medications before your next appointment, please call your pharmacy*   Lab Work: No Labs If you have labs (blood work) drawn today and your tests are completely normal, you will receive your results only by: [name] MyChart Message (if you have MyChart) OR . A paper copy in the mail If you have any lab test that is abnormal or we need to change your treatment, we will call you to review the results.   Testing/Procedures: No Testing    Follow-Up: At [facility], you and your health needs are our priority.  As part of our continuing mission to provide you with exceptional heart care, we have created designated Provider Care Teams.  These Care Teams include your primary Cardiologist (physician) and Advanced Practice Providers (APPs -  Physician Assistants and Nurse Practitioners) who all work together to provide you with the care you need, when you need it.  Your next appointment:   6 month(s)  The format for your next appointment:   In Person  Provider:   Peter Martinique, MD

## 2020-09-03 NOTE — Progress Notes (Signed)
Cardiology Office Note:    Date:  09/03/2020   ID:  Kelli [name], DOB [birth year], MRN [id number]  PCP:  Lorrene Reid, PA-C  [facility] Cardiologist:  Peter Martinique, MD  [place] Electrophysiologist:  None   Referring MD: Lorrene Reid, PA-C   Chief Complaint  Patient presents with  . Follow-up    Seen for Dr. Martinique    History of Present Illness:    Kelli [name] is a 79 y.o. female with a hx of paroxysmal atrial fibrillation, hypertension, hyperlipidemia, history of demand ischemia in the setting of A. fib with RVR.  Patient was admitted with atrial fibrillation with RVR in 2014 and had elevated troponin at the time.  Myoview was normal.  Echocardiogram in [place] 2014 showed EF 55 to 60%, grade 1 DD.  She has been maintained on chronic metoprolol and Eliquis therapy.  Patient was last seen by Dr. Martinique in April 2021 at which time she was doing well.  Patient presents today for follow-up. 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 She denies any recent exertional chest pain. She has mild dyspnea on exertion however this is not significantly changed when compared to before. She has no lower extremity edema, orthopnea or PND. Recent lab work does show LDL of 107 which was right over the borderline. She has a history of myalgia associated with higher dose of statin and is not currently interested to increase the statin medication. Blood pressure is borderline elevated in the office however normal at home. I recommended continue on the current therapy. She can follow-up with Dr. Martinique in 6 months.   Past Medical History:  Diagnosis Date  . Allergy    Phreesia 07/04/2020  . Arthritis    Phreesia 07/04/2020  . Atrial fibrillation ([place]) 02/2013   paroxsymal; c/b Type 2 NSTEMI; CHADS2-VASc=2 (age, sex - patient had come off anti-HTN meds in past); Eliquis started;  Echocardiogram 03/10/13: EF 75-10%, grade 1 diastolic dysfunction, mild MR,  mild LAE  . GERD (gastroesophageal reflux disease)    Phreesia 07/04/2020  . Hyperlipidemia   . Hypertension   . Myocardial infarction ([place])    Phreesia 07/04/2020  . NSTEMI (non-ST elevated myocardial infarction) ([place]) 02/2013   demand ischemia in setting of AFib with RVR; ETT-Myoview 03/11/13: No ischemia or scar, EF 70%.    Past Surgical History:  Procedure Laterality Date  . APPENDECTOMY    . CATARACT EXTRACTION, BILATERAL    . EYE SURGERY    . TONSILLECTOMY      Current Medications: Current Meds  Medication Sig  . acetaminophen (TYLENOL) 500 MG tablet Take 500 mg by mouth daily as needed for pain.  [name] apixaban (ELIQUIS) 5 MG TABS tablet Take 1 tablet (5 mg total) by mouth 2 (two) times daily.  [name] atorvastatin (LIPITOR) 10 MG tablet Take 5 mg by mouth daily.   . Calcium Carb-Cholecalciferol (CALCIUM + D3) 600-200 MG-UNIT TABS Take 1 tablet by mouth daily.  . Cholecalciferol (VITAMIN D) 400 UNITS capsule Take 400 Units by mouth daily.  [name] Calcium (STOOL SOFTENER PO) Take by mouth at bedtime.  [name] ELDERBERRY PO Take by mouth.  . FIBER ADULT GUMMIES PO Take 1 Dose by mouth daily.  . fish oil-omega-3 fatty acids 1000 MG capsule Take 1 g by mouth daily.  . Flaxseed, Linseed, (FLAX SEED OIL PO) Take by mouth daily.  . fluticasone (FLONASE) 50 MCG/ACT nasal spray Place 1 spray into both  nostrils daily as needed.   . hydrochlorothiazide (HYDRODIURIL) 12.5 MG tablet Take 1 tablet (12.5 mg total) by mouth daily.  [name] losartan (COZAAR) 50 MG tablet Take 1 tablet (50 mg total) by mouth daily.  . Magnesium 300 MG CAPS Take 1 capsule by mouth daily.  . metoprolol tartrate (LOPRESSOR) 50 MG tablet TAKE 1 TABLET BY MOUTH IN THE MORNING AND TAKE 1/2 TABLET IN THE AFTERNOON.  . Probiotic Product (PROBIOTIC DAILY PO) Take 1 tablet by mouth daily as needed.   . vitamin C (ASCORBIC ACID) 500 MG tablet Take 500 mg by mouth daily.     Allergies:   Amoxicillin and Augmentin [amoxicillin-pot  clavulanate]   Social History   Socioeconomic History  . Marital status: Widowed    Spouse name: Not on file  . Number of children: 3  . Years of education: Not on file  . Highest education level: Not on file  Occupational History  . Occupation: Surveyor, quantity- Day care    Employer: [employer]  Tobacco Use  . Smoking status: Never Smoker  . Smokeless tobacco: Never Used  Vaping Use  . Vaping Use: Never used  Substance and Sexual Activity  . Alcohol use: Yes    Comment: 1-2 glasses of wine occ.  . Drug use: No  . Sexual activity: Not Currently  Other Topics Concern  . Not on file  Social History Narrative  . Not on file   Social Determinants of Health   Financial Resource Strain: Not on file  Food Insecurity: Not on file  Transportation Needs: Not on file  Physical Activity: Not on file  Stress: Not on file  Social Connections: Not on file     Family History: The patient's family history includes Cancer in her maternal aunt; Heart attack (age of onset: 70) in her mother.  ROS:   Please see the history of present illness.     All other systems reviewed and are negative.  EKGs/Labs/Other Studies Reviewed:    The following studies were reviewed today:  Echo 03/10/2013 LV EF: 55% -  60%  Study Conclusions   - Left ventricle: The cavity size was normal. Wall thickness  was normal. Systolic function was normal. The estimated  ejection fraction was in the range of 55% to 60%. Regional  wall motion abnormalities cannot be excluded. Doppler  parameters are consistent with abnormal left ventricular  relaxation (grade 1 diastolic dysfunction). Doppler  parameters are consistent with high ventricular filling  pressure.  - Mitral valve: Calcified annulus. Mild regurgitation.  - Left atrium: The atrium was mildly dilated.    Myoview 03/11/2013 Nuclear data: The patient was studied in 1-day rest stress  protocol she was injected with  10 mCi technetium 99 labeled  sestamibi at rest, 30 mCi technetium 99 labeled sestamibi at stress  images were reconstructed the short, vertical, horizontal axes.   In the initial stress images there is overall normal perfusion.   In the recovery images there is mild thinning in the inferolateral  wall.   On gating LVEF was calculated at greater than 70%.   Review of the raw data the inferolateral changes at rest most  likely reflects soft tissue (diaphragm)   Impression: Overall normal perfusion. No evidence for ischemia or  scar. LVEF on gating calculated greater than 70%.     EKG:  EKG is ordered today.  The ekg ordered today demonstrates normal sinus rhythm without significant ST-T wave changes.  Recent Labs: 06/15/2020: ALT  16; BUN 26; Creatinine, Ser 1.29; Hemoglobin 13.4; Platelets 271; Potassium 4.7; Sodium 138; TSH 1.910  Recent Lipid Panel    Component Value Date/Time   CHOL 171 06/15/2020 0827   TRIG 88 06/15/2020 0827   HDL 48 06/15/2020 0827   CHOLHDL 3.6 06/15/2020 0827   CHOLHDL 3.5 03/10/2013 0500   VLDL 14 03/10/2013 0500   LDLCALC 107 (H) 06/15/2020 0827     Risk Assessment/Calculations:     CHA2DS2-VASc Score = 4  This indicates a 4.8% annual risk of stroke. The patient's score is based upon: CHF History: No HTN History: Yes Diabetes History: No Stroke History: No Vascular Disease History: No Age Score: 2 Gender Score: 1      Physical Exam:    VS:  BP 140/71   Pulse 60   Temp (!) 93.9 F (34.4 C)   Ht 5\' 3"  (1.6 m)   Wt 167 lb 12.8 oz (76.1 kg)   SpO2 97%   BMI 29.72 kg/m     Wt Readings from Last 3 Encounters:  09/03/20 167 lb 12.8 oz (76.1 kg)  07/05/20 168 lb (76.2 kg)  02/09/20 168 lb 8 oz (76.4 kg)     GEN:  Well nourished, well developed in no acute distress HEENT: Normal NECK: No JVD; No carotid bruits LYMPHATICS: No lymphadenopathy CARDIAC: RRR, no murmurs, rubs, gallops RESPIRATORY:  Clear to auscultation without  rales, wheezing or rhonchi  ABDOMEN: Soft, non-tender, non-distended MUSCULOSKELETAL:  No edema; No deformity  SKIN: Warm and dry NEUROLOGIC:  Alert and oriented x 3 PSYCHIATRIC:  Normal affect   ASSESSMENT:    1. PAF (paroxysmal atrial fibrillation) ([place])   2. Essential hypertension   3. Hyperlipidemia LDL goal <70   4. Prediabetes    PLAN:    In order of problems listed above:  1. PAF: Continue Eliquis and metoprolol. Heart rate 53 bpm. She occasionally to have palpitation, however she described them as skipped heartbeatd that lasted about a second before going away, this sounds like PVCs. I recommended cutting back on dark chocolate. There has been no obvious recurrence of atrial fibrillation since 2014.  2. Hypertension: Blood pressure borderline elevated in the office today, however normally her blood pressure is in the 120s at home. We will continue on the current therapy  3. Hyperlipidemia: She is on the lowest possible dose of Lipitor, she has myalgia associated with higher dose and is currently not interested to increase that Lipitor dosage. Recent LDL was 107 in October 2021. HDL, total cholesterol and triglyceride were controlled.  4. Prediabetes: Hemoglobin A1c 5.9 in September 2020, this year it improved to 5.7. Continue diet and exercise.        Medication Adjustments/Labs and Tests Ordered: Current medicines are reviewed at length with the patient today.  Concerns regarding medicines are outlined above.  Orders Placed This Encounter  Procedures  . EKG 12-Lead   No orders of the defined types were placed in this encounter.   Patient Instructions  Medication Instructions:  No changes *If you need a refill on your cardiac medications before your next appointment, please call your pharmacy*   Lab Work: No Labs If you have labs (blood work) drawn today and your tests are completely normal, you will receive your results only by: [name] MyChart Message (if you have  MyChart) OR . A paper copy in the mail If you have any lab test that is abnormal or we need to change your treatment, we will call you  to review the results.   Testing/Procedures: No Testing    Follow-Up: At [facility], you and your health needs are our priority.  As part of our continuing mission to provide you with exceptional heart care, we have created designated Provider Care Teams.  These Care Teams include your primary Cardiologist (physician) and Advanced Practice Providers (APPs -  Physician Assistants and Nurse Practitioners) who all work together to provide you with the care you need, when you need it.  Your next appointment:   6 month(s)  The format for your next appointment:   In Person  Provider:   Peter Martinique, MD       Signed, Almyra Deforest, [place]  09/03/2020 9:41 AM    [place]

## 2020-09-04 DIAGNOSIS — M4723 Other spondylosis with radiculopathy, cervicothoracic region: Secondary | ICD-10-CM | POA: Diagnosis not present

## 2020-09-04 DIAGNOSIS — M9901 Segmental and somatic dysfunction of cervical region: Secondary | ICD-10-CM | POA: Diagnosis not present

## 2020-09-04 DIAGNOSIS — M4727 Other spondylosis with radiculopathy, lumbosacral region: Secondary | ICD-10-CM | POA: Diagnosis not present

## 2020-09-04 DIAGNOSIS — M9904 Segmental and somatic dysfunction of sacral region: Secondary | ICD-10-CM | POA: Diagnosis not present

## 2020-09-04 DIAGNOSIS — M9903 Segmental and somatic dysfunction of lumbar region: Secondary | ICD-10-CM | POA: Diagnosis not present

## 2020-10-16 IMAGING — CR RIGHT KNEE - COMPLETE 4+ VIEW
4 series · 4 of 4 positions shown · non-contrast
Comparison: 05/18/2018

CLINICAL DATA: Pain status post fall

EXAM:
RIGHT KNEE - COMPLETE 4+ VIEW

[t knee ap right]
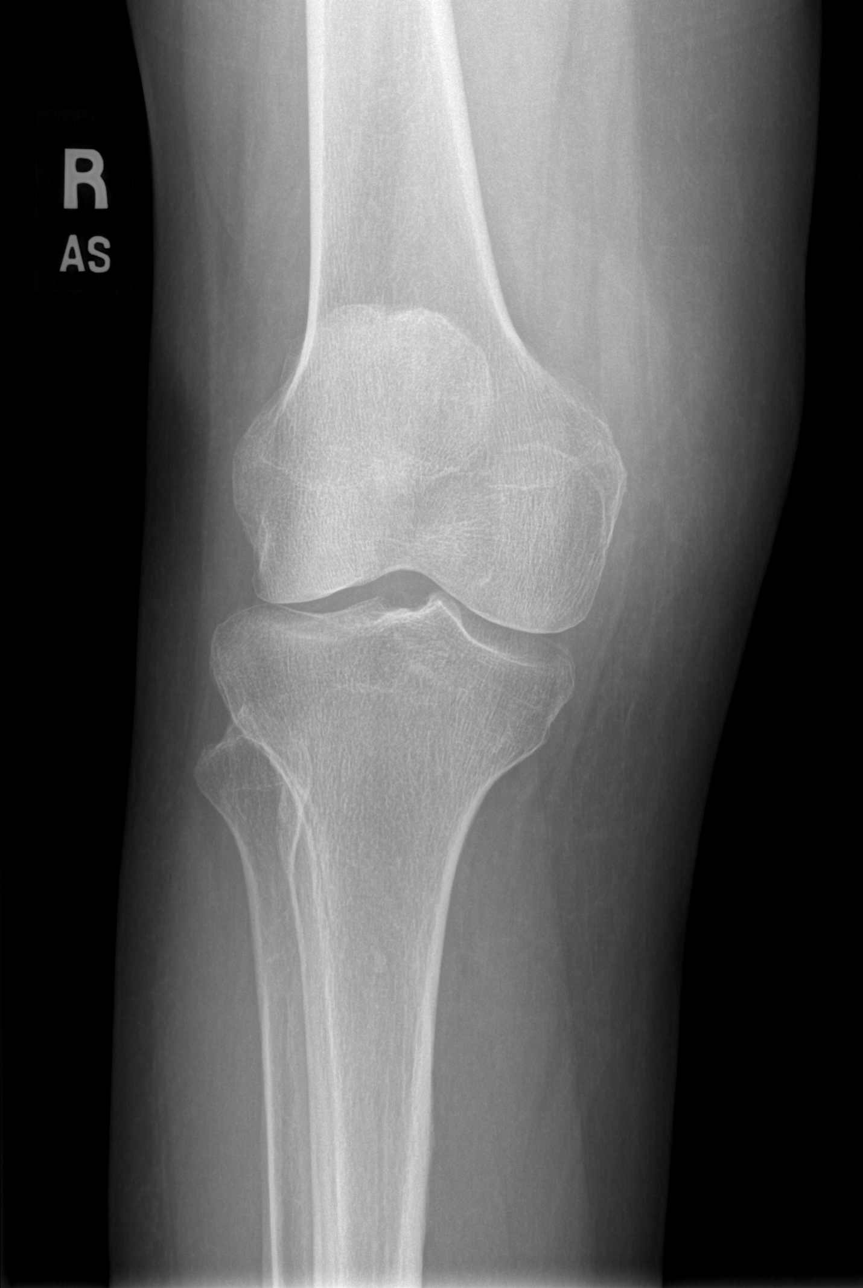

[t knee oblique right (1 of 2)]
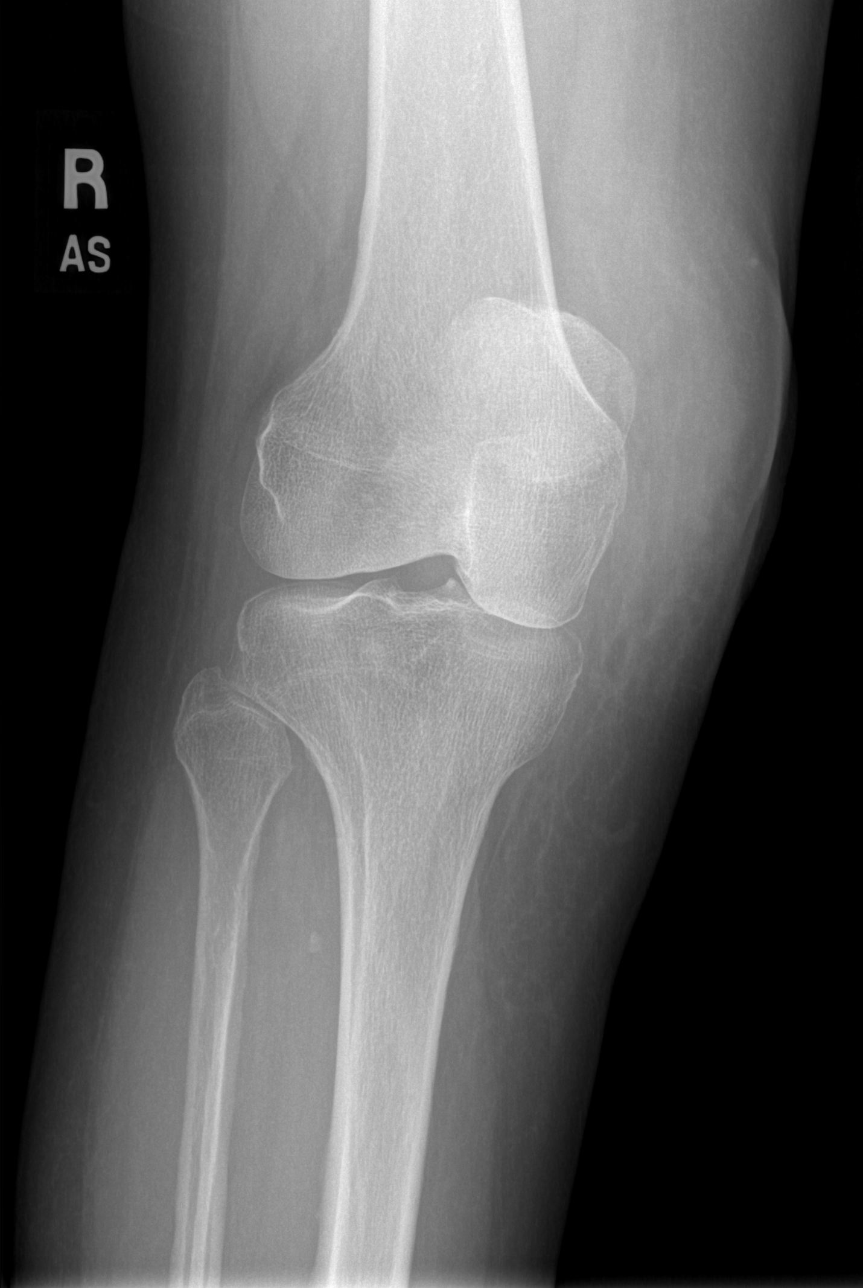

[t knee oblique right (2 of 2)]
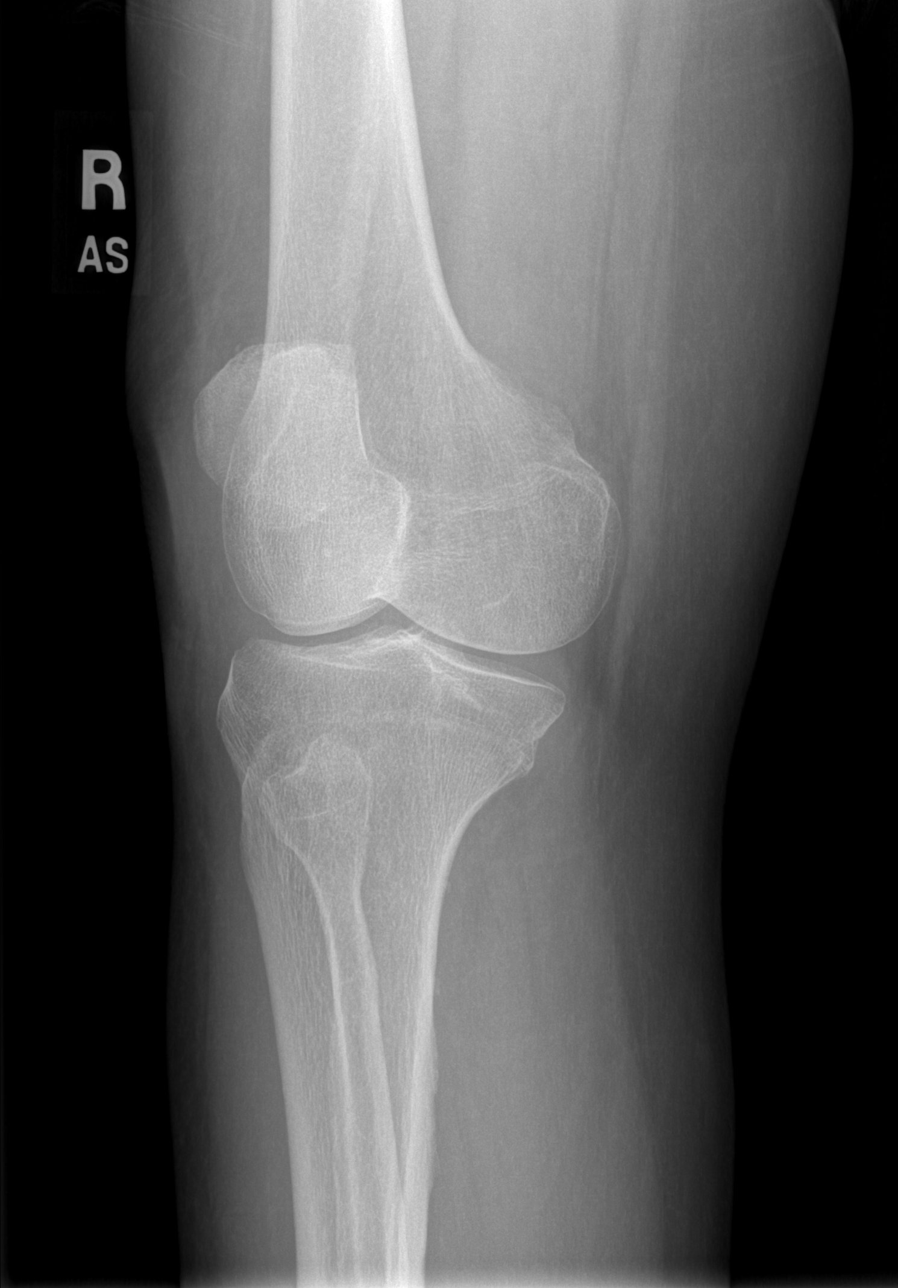

[t knee lat right]
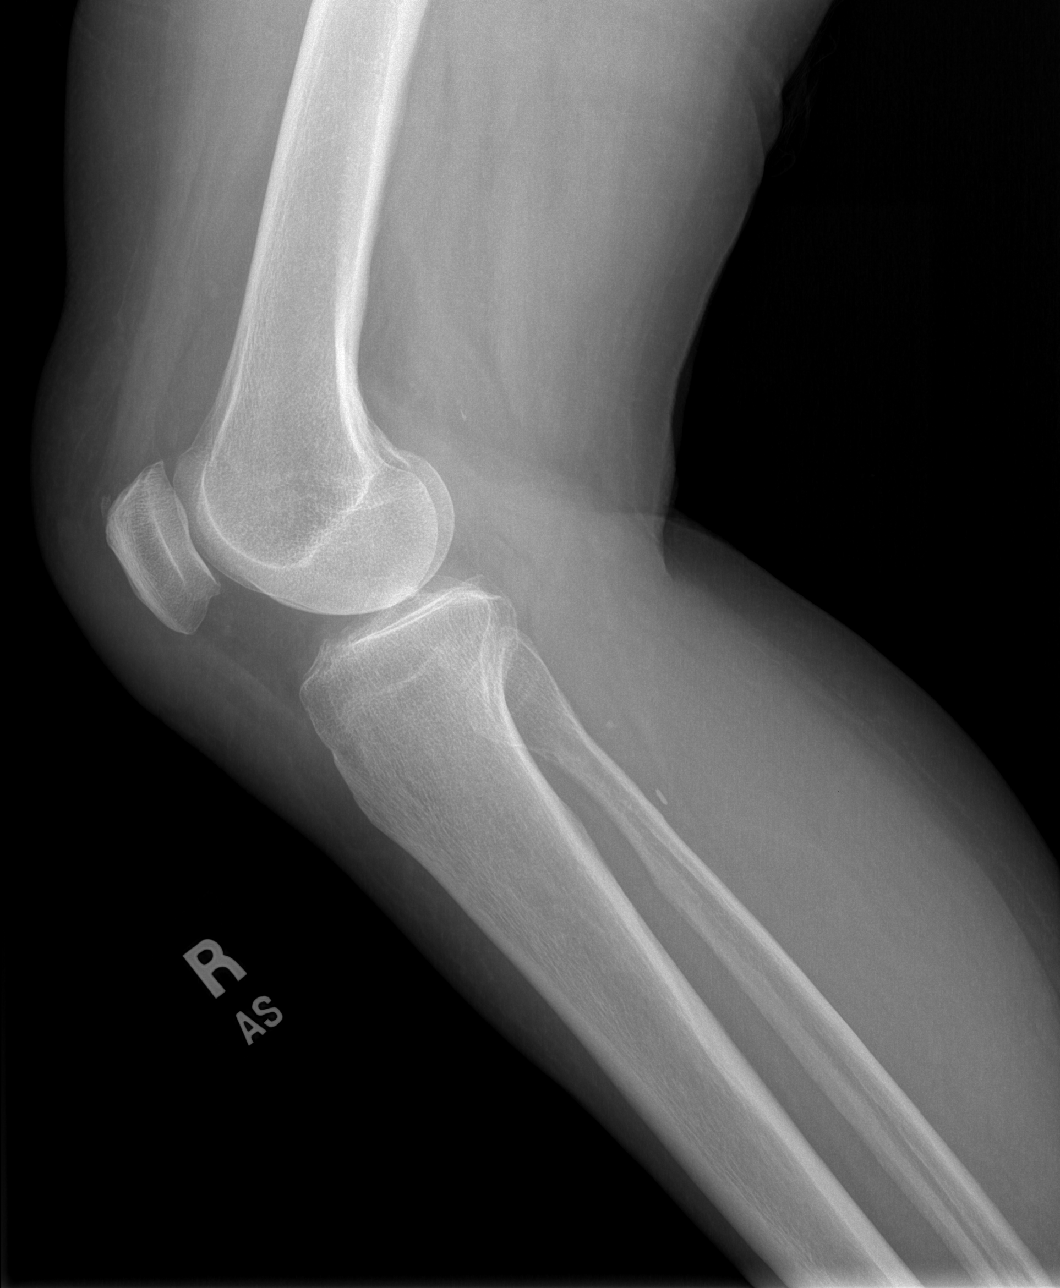

[4 of 4 positions shown; findings below may reference images not displayed]

FINDINGS: There is prepatellar soft tissue swelling without evidence of a
displaced fracture or dislocation. No radiopaque foreign body. No
significant joint effusion. Mild degenerative changes are noted.
IMPRESSION: 1. No acute displaced fracture or dislocation.
2. Prepatellar soft tissue swelling.

## 2020-10-18 DIAGNOSIS — M9904 Segmental and somatic dysfunction of sacral region: Secondary | ICD-10-CM | POA: Diagnosis not present

## 2020-10-18 DIAGNOSIS — M4727 Other spondylosis with radiculopathy, lumbosacral region: Secondary | ICD-10-CM | POA: Diagnosis not present

## 2020-10-18 DIAGNOSIS — M9901 Segmental and somatic dysfunction of cervical region: Secondary | ICD-10-CM | POA: Diagnosis not present

## 2020-10-18 DIAGNOSIS — M47812 Spondylosis without myelopathy or radiculopathy, cervical region: Secondary | ICD-10-CM | POA: Diagnosis not present

## 2020-10-18 DIAGNOSIS — M9903 Segmental and somatic dysfunction of lumbar region: Secondary | ICD-10-CM | POA: Diagnosis not present

## 2020-10-18 DIAGNOSIS — M5388 Other specified dorsopathies, sacral and sacrococcygeal region: Secondary | ICD-10-CM | POA: Diagnosis not present

## 2020-11-06 DIAGNOSIS — M9903 Segmental and somatic dysfunction of lumbar region: Secondary | ICD-10-CM | POA: Diagnosis not present

## 2020-11-06 DIAGNOSIS — M4727 Other spondylosis with radiculopathy, lumbosacral region: Secondary | ICD-10-CM | POA: Diagnosis not present

## 2020-11-06 DIAGNOSIS — M9904 Segmental and somatic dysfunction of sacral region: Secondary | ICD-10-CM | POA: Diagnosis not present

## 2020-11-06 DIAGNOSIS — M4723 Other spondylosis with radiculopathy, cervicothoracic region: Secondary | ICD-10-CM | POA: Diagnosis not present

## 2020-11-06 DIAGNOSIS — M5388 Other specified dorsopathies, sacral and sacrococcygeal region: Secondary | ICD-10-CM | POA: Diagnosis not present

## 2020-11-06 DIAGNOSIS — M9901 Segmental and somatic dysfunction of cervical region: Secondary | ICD-10-CM | POA: Diagnosis not present

## 2020-12-03 DIAGNOSIS — M9901 Segmental and somatic dysfunction of cervical region: Secondary | ICD-10-CM | POA: Diagnosis not present

## 2020-12-03 DIAGNOSIS — M9904 Segmental and somatic dysfunction of sacral region: Secondary | ICD-10-CM | POA: Diagnosis not present

## 2020-12-03 DIAGNOSIS — M5388 Other specified dorsopathies, sacral and sacrococcygeal region: Secondary | ICD-10-CM | POA: Diagnosis not present

## 2020-12-03 DIAGNOSIS — M4727 Other spondylosis with radiculopathy, lumbosacral region: Secondary | ICD-10-CM | POA: Diagnosis not present

## 2020-12-03 DIAGNOSIS — M4723 Other spondylosis with radiculopathy, cervicothoracic region: Secondary | ICD-10-CM | POA: Diagnosis not present

## 2020-12-03 DIAGNOSIS — M9903 Segmental and somatic dysfunction of lumbar region: Secondary | ICD-10-CM | POA: Diagnosis not present

## 2021-01-02 DIAGNOSIS — M4727 Other spondylosis with radiculopathy, lumbosacral region: Secondary | ICD-10-CM | POA: Diagnosis not present

## 2021-01-02 DIAGNOSIS — M5388 Other specified dorsopathies, sacral and sacrococcygeal region: Secondary | ICD-10-CM | POA: Diagnosis not present

## 2021-01-02 DIAGNOSIS — M9901 Segmental and somatic dysfunction of cervical region: Secondary | ICD-10-CM | POA: Diagnosis not present

## 2021-01-02 DIAGNOSIS — M9903 Segmental and somatic dysfunction of lumbar region: Secondary | ICD-10-CM | POA: Diagnosis not present

## 2021-01-02 DIAGNOSIS — M9904 Segmental and somatic dysfunction of sacral region: Secondary | ICD-10-CM | POA: Diagnosis not present

## 2021-01-02 DIAGNOSIS — M4723 Other spondylosis with radiculopathy, cervicothoracic region: Secondary | ICD-10-CM | POA: Diagnosis not present

## 2021-01-24 ENCOUNTER — Other Ambulatory Visit: Payer: Self-pay | Admitting: Cardiology

## 2021-01-24 NOTE — Telephone Encounter (Signed)
41f, 76.1kg, scr 1.29 06/15/20, lovw/meng 09/03/20

## 2021-02-01 DIAGNOSIS — M5388 Other specified dorsopathies, sacral and sacrococcygeal region: Secondary | ICD-10-CM | POA: Diagnosis not present

## 2021-02-01 DIAGNOSIS — M47894 Other spondylosis, thoracic region: Secondary | ICD-10-CM | POA: Diagnosis not present

## 2021-02-01 DIAGNOSIS — M9904 Segmental and somatic dysfunction of sacral region: Secondary | ICD-10-CM | POA: Diagnosis not present

## 2021-02-01 DIAGNOSIS — M9901 Segmental and somatic dysfunction of cervical region: Secondary | ICD-10-CM | POA: Diagnosis not present

## 2021-02-01 DIAGNOSIS — M4723 Other spondylosis with radiculopathy, cervicothoracic region: Secondary | ICD-10-CM | POA: Diagnosis not present

## 2021-02-01 DIAGNOSIS — M9902 Segmental and somatic dysfunction of thoracic region: Secondary | ICD-10-CM | POA: Diagnosis not present

## 2021-02-04 ENCOUNTER — Ambulatory Visit: Payer: Medicare Other | Admitting: Cardiology

## 2021-02-19 NOTE — Progress Notes (Signed)
Kelli [name] Date of Birth: 03-04-1941 Medical Record #[id number]  History of Present Illness: Kelli [name] is seen for follow up of atrial fibrillation.  She has a history of HTN, paroxysmal Afib with RVR. Admitted in 2014. Had demand ischemia. Myoview study was normal.  Echo from [place] of 2014 with EF of 55 to 60% and grade 1 diastolic dysfunction. She has been on chronic therapy with metoprolol and Eliquis. She was last seen in Dec 2021 by Almyra Deforest PA-C and was doing well.   On follow up today she is doing very well. 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 Denies any palpitations, dizziness, Chest pain. Rare SOB. Does note occasional loose stools.     Current Outpatient Medications  Medication Sig Dispense Refill   acetaminophen (TYLENOL) 500 MG tablet Take 500 mg by mouth daily as needed for pain.     atorvastatin (LIPITOR) 10 MG tablet Take 5 mg by mouth daily.      Calcium Carb-Cholecalciferol (CALCIUM + D3) 600-200 MG-UNIT TABS Take 1 tablet by mouth daily.     Cholecalciferol (VITAMIN D) 400 UNITS capsule Take 400 Units by mouth daily.     Docusate Calcium (STOOL SOFTENER PO) Take by mouth at bedtime.     ELDERBERRY PO Take by mouth.     ELIQUIS 5 MG TABS tablet TAKE 1 TABLET BY MOUTH 2 TIMES DAILY. 180 tablet 1   FIBER ADULT GUMMIES PO Take 1 Dose by mouth daily.     fish oil-omega-3 fatty acids 1000 MG capsule Take 1 g by mouth daily.     Flaxseed, Linseed, (FLAX SEED OIL PO) Take by mouth daily.     fluticasone (FLONASE) 50 MCG/ACT nasal spray Place 1 spray into both nostrils daily as needed.      Magnesium 300 MG CAPS Take 1 capsule by mouth daily.     metoprolol tartrate (LOPRESSOR) 50 MG tablet TAKE 1 TABLET BY MOUTH IN THE MORNING AND TAKE 1/2 TABLET IN THE AFTERNOON. 135 tablet 1   Probiotic Product (PROBIOTIC DAILY PO) Take 1 tablet by mouth daily as needed.      vitamin C (ASCORBIC ACID) 500 MG tablet Take 500 mg by mouth  daily.     hydrochlorothiazide (HYDRODIURIL) 12.5 MG tablet Take 1 tablet (12.5 mg total) by mouth daily. 90 tablet 3   losartan (COZAAR) 50 MG tablet Take 1 tablet (50 mg total) by mouth daily. 90 tablet 3   No current facility-administered medications for this visit.    Allergies  Allergen Reactions   Amoxicillin Rash   Augmentin [Amoxicillin-Pot Clavulanate] Itching and Rash    Past Medical History:  Diagnosis Date   Allergy    Phreesia 07/04/2020   Arthritis    Phreesia 07/04/2020   Atrial fibrillation ([place]) 02/2013   paroxsymal; c/b Type 2 NSTEMI; CHADS2-VASc=2 (age, sex - patient had come off anti-HTN meds in past); Eliquis started;  Echocardiogram 03/10/13: EF 63-78%, grade 1 diastolic dysfunction, mild MR, mild LAE   GERD (gastroesophageal reflux disease)    Phreesia 07/04/2020   Hyperlipidemia    Hypertension    Myocardial infarction [facility])    Phreesia 07/04/2020   NSTEMI (non-ST elevated myocardial infarction) ([place]) 02/2013   demand ischemia in setting of AFib with RVR; ETT-Myoview 03/11/13: No ischemia or scar, EF 70%.    Past Surgical History:  Procedure Laterality Date   APPENDECTOMY     CATARACT EXTRACTION, BILATERAL  EYE SURGERY     TONSILLECTOMY      Social History   Tobacco Use  Smoking Status Never  Smokeless Tobacco Never    Social History   Substance and Sexual Activity  Alcohol Use Yes   Comment: 1-2 glasses of wine occ.    Family History  Problem Relation Age of Onset   Heart attack Mother 59   Cancer Maternal Aunt        throat    Review of Systems: The review of systems is per the HPI.  All other systems were reviewed and are negative.  Physical Exam: BP 124/78 (BP Location: Left Arm, Patient Position: Sitting, Cuff Size: Normal)   Pulse (!) 56   Ht 5\' 3"  (1.6 m)   Wt 162 lb 12.8 oz (73.8 kg)   SpO2 97%   BMI 28.84 kg/m  GENERAL:  Well appearing WF in NAD HEENT:  PERRL, EOMI, sclera are clear. Oropharynx is clear. NECK:   No jugular venous distention, carotid upstroke brisk and symmetric, no bruits, no thyromegaly or adenopathy LUNGS:  Clear to auscultation bilaterally CHEST:  Unremarkable HEART:  RRR,  PMI not displaced or sustained,S1 and S2 within normal limits, no S3, no S4: no clicks, no rubs, no murmurs ABD:  Soft, nontender. BS +, no masses or bruits. No hepatomegaly, no splenomegaly EXT:  2 + pulses throughout, no edema, no cyanosis no clubbing SKIN:  Warm and dry.  No rashes NEURO:  Alert and oriented x 3. Cranial nerves II through XII intact. PSYCH:  Cognitively intact      LABORATORY DATA:  Lab Results  Component Value Date   WBC 7.5 06/15/2020   HGB 13.4 06/15/2020   HCT 40.1 06/15/2020   PLT 271 06/15/2020   GLUCOSE 87 06/15/2020   CHOL 171 06/15/2020   TRIG 88 06/15/2020   HDL 48 06/15/2020   LDLCALC 107 (H) 06/15/2020   ALT 16 06/15/2020   AST 28 06/15/2020   NA 138 06/15/2020   K 4.7 06/15/2020   CL 100 06/15/2020   CREATININE 1.29 (H) 06/15/2020   BUN 26 06/15/2020   CO2 26 06/15/2020   TSH 1.910 06/15/2020   INR 1.19 01/03/2014   HGBA1C 5.7 (H) 06/15/2020   Labs dated 12/09/16: cholesterol 180, triglycerides 93, HDL 48, LDL 113. CMET normal.   Assessment / Plan: 1.  PAF - Maintaining NSR. Continue metoprolol and Eliquis.   2.  Chronic anticoagulation- tolerating well.  3. HTN - BP is well controlled.  4. HLD. Last LDL 107 on 10 mg lipitor.    I will follow up in one year

## 2021-03-04 DIAGNOSIS — M5388 Other specified dorsopathies, sacral and sacrococcygeal region: Secondary | ICD-10-CM | POA: Diagnosis not present

## 2021-03-04 DIAGNOSIS — M47894 Other spondylosis, thoracic region: Secondary | ICD-10-CM | POA: Diagnosis not present

## 2021-03-04 DIAGNOSIS — M9902 Segmental and somatic dysfunction of thoracic region: Secondary | ICD-10-CM | POA: Diagnosis not present

## 2021-03-04 DIAGNOSIS — M4723 Other spondylosis with radiculopathy, cervicothoracic region: Secondary | ICD-10-CM | POA: Diagnosis not present

## 2021-03-04 DIAGNOSIS — M9904 Segmental and somatic dysfunction of sacral region: Secondary | ICD-10-CM | POA: Diagnosis not present

## 2021-03-04 DIAGNOSIS — M9901 Segmental and somatic dysfunction of cervical region: Secondary | ICD-10-CM | POA: Diagnosis not present

## 2021-03-05 ENCOUNTER — Encounter: Payer: Self-pay | Admitting: Cardiology

## 2021-03-05 ENCOUNTER — Ambulatory Visit: Payer: Medicare Other | Admitting: Cardiology

## 2021-03-05 ENCOUNTER — Other Ambulatory Visit: Payer: Self-pay

## 2021-03-05 VITALS — BP 124/78 | HR 56 | Ht 63.0 in | Wt 162.8 lb

## 2021-03-05 DIAGNOSIS — I480 Paroxysmal atrial fibrillation: Secondary | ICD-10-CM | POA: Diagnosis not present

## 2021-03-05 DIAGNOSIS — E7800 Pure hypercholesterolemia, unspecified: Secondary | ICD-10-CM | POA: Diagnosis not present

## 2021-03-05 DIAGNOSIS — I10 Essential (primary) hypertension: Secondary | ICD-10-CM

## 2021-03-05 MED ORDER — LOSARTAN POTASSIUM 50 MG PO TABS
50.0000 mg | ORAL_TABLET | Freq: Every day | ORAL | 3 refills | Status: DC
Start: 2021-03-05 — End: 2022-05-26

## 2021-03-05 MED ORDER — HYDROCHLOROTHIAZIDE 12.5 MG PO TABS
12.5000 mg | ORAL_TABLET | Freq: Every day | ORAL | 3 refills | Status: DC
Start: 2021-03-05 — End: 2022-04-28

## 2021-03-14 DIAGNOSIS — H35371 Puckering of macula, right eye: Secondary | ICD-10-CM | POA: Diagnosis not present

## 2021-03-14 DIAGNOSIS — H52223 Regular astigmatism, bilateral: Secondary | ICD-10-CM | POA: Diagnosis not present

## 2021-03-14 DIAGNOSIS — H4710 Unspecified papilledema: Secondary | ICD-10-CM | POA: Diagnosis not present

## 2021-03-14 DIAGNOSIS — H524 Presbyopia: Secondary | ICD-10-CM | POA: Diagnosis not present

## 2021-03-14 DIAGNOSIS — H547 Unspecified visual loss: Secondary | ICD-10-CM | POA: Diagnosis not present

## 2021-03-14 DIAGNOSIS — H5201 Hypermetropia, right eye: Secondary | ICD-10-CM | POA: Diagnosis not present

## 2021-03-25 ENCOUNTER — Encounter (INDEPENDENT_AMBULATORY_CARE_PROVIDER_SITE_OTHER): Payer: Self-pay | Admitting: Ophthalmology

## 2021-03-26 ENCOUNTER — Encounter (INDEPENDENT_AMBULATORY_CARE_PROVIDER_SITE_OTHER): Payer: Medicare Other | Admitting: Ophthalmology

## 2021-03-26 NOTE — Progress Notes (Signed)
[facility] Note  03/29/2021     CHIEF COMPLAINT Patient presents for Retina Evaluation   HISTORY OF PRESENT ILLNESS: Kelli [name] is a 80 y.o. female who presents to the clinic today for:   HPI     Retina Evaluation   In right eye.  This started 2 years ago.  Duration of 2 years.  Associated Symptoms Floaters and Glare.  Context:  distance vision.  Treatments tried include no treatments.  I, the attending physician,  performed the HPI with the patient and updated documentation appropriately.        Comments   80 y/o  female pt referred by Dr. Marica Otter last week for eval of blurred VA OD.  Pt hadn't seen Dr. Sabra Heck in about 2 yrs, and feels her VA OD has been gradually decreasing over that period of time.  Denies pain, FOL, but has floaters and glare OU sometimes at night.  No gtts.  Hx of 2 strabismus sxs @ [facility] as a child.      Last edited by Bernarda Caffey, MD on 03/31/2021  9:10 PM.    Pt is here on the referral of Dr. Ammie Ferrier, she states she went to see her for a routnine eye exam bc she felt like her right eye vision is blurrier than normal, pt has had strabismus sx twice at [facility], once when she was very young and once when she was 35, pt has also had cataract sx with Dr. Herbert Deaner, pt states Dr. Sabra Heck was concerned with macular fluid and just wanted a second opinion, she states Dr. Sabra Heck also told her she has early macular degeneration  Referring physician: Marica Otter, OD [address],  [place] [id number]  HISTORICAL INFORMATION:   Selected notes from the MEDICAL RECORD NUMBER Referred by Dr. Marica Otter for concern of macular fluid LEE:  Ocular Hx- PMH-    CURRENT MEDICATIONS: No current outpatient medications on file. (Ophthalmic Drugs)   No current facility-administered medications for this visit. (Ophthalmic Drugs)   Current Outpatient Medications (Other)  Medication Sig   acetaminophen (TYLENOL) 500 MG  tablet Take 500 mg by mouth daily as needed for pain.   atorvastatin (LIPITOR) 10 MG tablet Take 5 mg by mouth daily.    Calcium Carb-Cholecalciferol (CALCIUM + D3) 600-200 MG-UNIT TABS Take 1 tablet by mouth daily.   Cholecalciferol (VITAMIN D) 400 UNITS capsule Take 400 Units by mouth daily.   Docusate Calcium (STOOL SOFTENER PO) Take by mouth at bedtime.   ELDERBERRY PO Take by mouth.   ELIQUIS 5 MG TABS tablet TAKE 1 TABLET BY MOUTH 2 TIMES DAILY.   FIBER ADULT GUMMIES PO Take 1 Dose by mouth daily.   fish oil-omega-3 fatty acids 1000 MG capsule Take 1 g by mouth daily.   Flaxseed, Linseed, (FLAX SEED OIL PO) Take by mouth daily.   fluticasone (FLONASE) 50 MCG/ACT nasal spray Place 1 spray into both nostrils daily as needed.    hydrochlorothiazide (HYDRODIURIL) 12.5 MG tablet Take 1 tablet (12.5 mg total) by mouth daily.   losartan (COZAAR) 50 MG tablet Take 1 tablet (50 mg total) by mouth daily.   Magnesium 300 MG CAPS Take 1 capsule by mouth daily.   metoprolol tartrate (LOPRESSOR) 50 MG tablet TAKE 1 TABLET BY MOUTH IN THE MORNING AND TAKE 1/2 TABLET IN THE AFTERNOON.   Probiotic Product (PROBIOTIC DAILY PO) Take 1 tablet by mouth daily as needed.  vitamin C (ASCORBIC ACID) 500 MG tablet Take 500 mg by mouth daily.   No current facility-administered medications for this visit. (Other)      REVIEW OF SYSTEMS: ROS   Positive for: Gastrointestinal, Musculoskeletal, Cardiovascular, Eyes Negative for: Constitutional, Neurological, Skin, Genitourinary, HENT, Endocrine, Respiratory, Psychiatric, Allergic/Imm, Heme/Lymph Last edited by Matthew Folks, COA on 03/29/2021  9:29 AM.       ALLERGIES Allergies  Allergen Reactions   Amoxicillin Rash   Augmentin [Amoxicillin-Pot Clavulanate] Itching and Rash    PAST MEDICAL HISTORY Past Medical History:  Diagnosis Date   Allergy    Phreesia 07/04/2020   Arthritis    Phreesia 07/04/2020   Atrial fibrillation ([place]) 02/2013    paroxsymal; c/b Type 2 NSTEMI; CHADS2-VASc=2 (age, sex - patient had come off anti-HTN meds in past); Eliquis started;  Echocardiogram 03/10/13: EF 16-10%, grade 1 diastolic dysfunction, mild MR, mild LAE   GERD (gastroesophageal reflux disease)    Phreesia 07/04/2020   Hyperlipidemia    Hypertension    Myocardial infarction [facility])    Phreesia 07/04/2020   NSTEMI (non-ST elevated myocardial infarction) ([place]) 02/2013   demand ischemia in setting of AFib with RVR; ETT-Myoview 03/11/13: No ischemia or scar, EF 70%.   Past Surgical History:  Procedure Laterality Date   APPENDECTOMY     CATARACT EXTRACTION     CATARACT EXTRACTION, BILATERAL     EYE SURGERY     STRABISMUS SURGERY     TONSILLECTOMY     YAG LASER APPLICATION      FAMILY HISTORY Family History  Problem Relation Age of Onset   Heart attack Mother 70   Cancer Maternal Aunt        throat    SOCIAL HISTORY Social History   Tobacco Use   Smoking status: Never   Smokeless tobacco: Never  Vaping Use   Vaping Use: Never used  Substance Use Topics   Alcohol use: Yes    Comment: 1-2 glasses of wine occ.   Drug use: No         OPHTHALMIC EXAM:  Base Eye Exam     Visual Acuity (Snellen - Linear)       Right Left   Dist cc 20/70 -2 20/40 -2   Dist ph cc 20/60 -2 20/30 -2    Correction: Glasses         Tonometry (Tonopen, 9:33 AM)       Right Left   Pressure 21 23         Pupils       Dark Light Shape React APD   Right 3 2 Round Brisk None   Left 3 2 Round Brisk None         Visual Fields (Counting fingers)       Left Right    Full Full         Extraocular Movement       Right Left    Full, Ortho Full, Ortho         Neuro/Psych     Oriented x3: Yes   Mood/Affect: Normal         Dilation     Both eyes: 1.0% Mydriacyl, 2.5% Phenylephrine @ 9:33 AM           Slit Lamp and Fundus Exam     Slit Lamp Exam       Right Left   Lids/Lashes Dermatochalasis - upper lid  Dermatochalasis - upper lid   Conjunctiva/Sclera White  and quiet White and quiet   Cornea arcus, trace PEE, mild EBMD arcus, trace PEE, mild EBMD, trace tear film debris   Anterior Chamber Deep and quiet Deep and quiet   Iris Round and dilated Round and dilated   Lens 3 piece PC IOL in good position with open PC 3 piece PC IOL in good position with open PC   Vitreous Vitreous syneresis Vitreous syneresis         Fundus Exam       Right Left   Disc Pink and Sharp Pink and Sharp   C/D Ratio 0.5 0.4   Macula Flat, Blunted foveal reflex, Drusen, RPE mottling and clumping, No heme or edema Flat, Blunted foveal reflex, Drusen, RPE mottling and clumping, No heme or edema   Vessels attenuated attenuated   Periphery Attached, reticular degeneration, No heme  Attached, reticular degeneration, No heme            Refraction     Wearing Rx       Sphere Cylinder Axis Add   Right -2.00 +3.50 128 +2.75   Left -2.25 +2.00 161 +2.75    Age: 26yrs   Type: PAL         Manifest Refraction       Sphere Cylinder Axis Dist VA   Right -2.50 +4.00 130 20/60-2   Left -2.50 +2.50 165 20/30-2            IMAGING AND PROCEDURES  Imaging and Procedures for 03/29/2021  OCT, Retina - OU - Both Eyes       Right Eye Quality was borderline. Central Foveal Thickness: 308. Progression has no prior data. Findings include normal foveal contour, no IRF, no SRF, retinal drusen , myopic contour (Trace ERM, partial PVD).   Left Eye Quality was borderline. Central Foveal Thickness: 294. Progression has no prior data. Findings include normal foveal contour, no IRF, no SRF, retinal drusen (Trace ERM, partial PVD).   Notes *Images captured and stored on drive  Diagnosis / Impression:  NFP; no IRF/SRF OU Trace ERM, partial PVD OU +drusen -- nonex ARMD  Clinical management:  See below  Abbreviations: NFP - Normal foveal profile. CME - cystoid macular edema. PED - pigment epithelial detachment.  IRF - intraretinal fluid. SRF - subretinal fluid. EZ - ellipsoid zone. ERM - epiretinal membrane. ORA - outer retinal atrophy. ORT - outer retinal tubulation. SRHM - subretinal hyper-reflective material. IRHM - intraretinal hyper-reflective material            ASSESSMENT/PLAN:    ICD-10-CM   1. Early dry stage nonexudative age-related macular degeneration of both eyes  H35.3131     2. Retinal edema  H35.81 OCT, Retina - OU - Both Eyes    3. Essential hypertension  I10     4. Hypertensive retinopathy of both eyes  H35.033     5. Pseudophakia, both eyes  Z96.1     6. History of strabismus surgery  Z98.890       1,2. Age related macular degeneration, non-exudative, both eyes  - early stage -- mild drusen  - no exudative disease  - The incidence, anatomy, and pathology of dry AMD, risk of progression, and the AREDS and AREDS 2 study including smoking risks discussed with patient.  - Recommend amsler grid monitoring  - f/u 3-4 months, DFE, OCT  3,4. Hypertensive retinopathy OU - discussed importance of tight BP control - monitor  5. Pseudophakia OU  - s/p CE/IOL (Dr. Herbert Deaner)  -  IOL in good position, doing well  - monitor  6. Hx of Strabismus OU   Ophthalmic Meds Ordered this visit:  No orders of the defined types were placed in this encounter.      Return for 3-4 mos - nonexudative ARMD OU.  There are no Patient Instructions on file for this visit.   Explained the diagnoses, plan, and follow up with the patient and they expressed understanding.  Patient expressed understanding of the importance of proper follow up care.   This document serves as a record of services personally performed by Gardiner Sleeper, MD, PhD. It was created on their behalf by San Jetty. Owens Shark, OA an ophthalmic technician. The creation of this record is the provider's dictation and/or activities during the visit.    Electronically signed by: San Jetty. Owens Shark, [place] 07.12.2022 9:20 PM   Gardiner Sleeper, M.D., Ph.D. Diseases & Surgery of the Retina and Vitreous [facility]  I have reviewed the above documentation for accuracy and completeness, and I agree with the above. Gardiner Sleeper, M.D., Ph.D. 03/31/21 9:20 PM   Abbreviations: M myopia (nearsighted); A astigmatism; H hyperopia (farsighted); P presbyopia; Mrx spectacle prescription;  CTL contact lenses; OD right eye; OS left eye; OU both eyes  XT exotropia; ET esotropia; PEK punctate epithelial keratitis; PEE punctate epithelial erosions; DES dry eye syndrome; MGD meibomian gland dysfunction; ATs artificial tears; PFAT's preservative free artificial tears; [place] nuclear sclerotic cataract; PSC posterior subcapsular cataract; ERM epi-retinal membrane; PVD posterior vitreous detachment; RD retinal detachment; DM diabetes mellitus; DR diabetic retinopathy; NPDR non-proliferative diabetic retinopathy; PDR proliferative diabetic retinopathy; CSME clinically significant macular edema; DME diabetic macular edema; dbh dot blot hemorrhages; CWS cotton wool spot; POAG primary open angle glaucoma; C/D cup-to-disc ratio; HVF humphrey visual field; GVF goldmann visual field; OCT optical coherence tomography; IOP intraocular pressure; BRVO Branch retinal vein occlusion; CRVO central retinal vein occlusion; CRAO central retinal artery occlusion; BRAO branch retinal artery occlusion; RT retinal tear; SB scleral buckle; PPV pars plana vitrectomy; VH Vitreous hemorrhage; PRP panretinal laser photocoagulation; IVK intravitreal kenalog; VMT vitreomacular traction; MH Macular hole;  NVD neovascularization of the disc; NVE neovascularization elsewhere; AREDS age related eye disease study; ARMD age related macular degeneration; POAG primary open angle glaucoma; EBMD epithelial/anterior basement membrane dystrophy; ACIOL anterior chamber intraocular lens; IOL intraocular lens; PCIOL posterior chamber intraocular lens; Phaco/IOL  phacoemulsification with intraocular lens placement; [place] photorefractive keratectomy; LASIK laser assisted in situ keratomileusis; HTN hypertension; DM diabetes mellitus; COPD chronic obstructive pulmonary disease

## 2021-03-29 ENCOUNTER — Other Ambulatory Visit: Payer: Self-pay

## 2021-03-29 ENCOUNTER — Encounter (INDEPENDENT_AMBULATORY_CARE_PROVIDER_SITE_OTHER): Payer: Self-pay | Admitting: Ophthalmology

## 2021-03-29 ENCOUNTER — Ambulatory Visit (INDEPENDENT_AMBULATORY_CARE_PROVIDER_SITE_OTHER): Payer: Medicare Other | Admitting: Ophthalmology

## 2021-03-29 DIAGNOSIS — H35033 Hypertensive retinopathy, bilateral: Secondary | ICD-10-CM

## 2021-03-29 DIAGNOSIS — I10 Essential (primary) hypertension: Secondary | ICD-10-CM | POA: Diagnosis not present

## 2021-03-29 DIAGNOSIS — H3581 Retinal edema: Secondary | ICD-10-CM | POA: Diagnosis not present

## 2021-03-29 DIAGNOSIS — Z961 Presence of intraocular lens: Secondary | ICD-10-CM | POA: Diagnosis not present

## 2021-03-29 DIAGNOSIS — H353131 Nonexudative age-related macular degeneration, bilateral, early dry stage: Secondary | ICD-10-CM | POA: Diagnosis not present

## 2021-03-29 DIAGNOSIS — Z9889 Other specified postprocedural states: Secondary | ICD-10-CM | POA: Diagnosis not present

## 2021-03-31 ENCOUNTER — Encounter (INDEPENDENT_AMBULATORY_CARE_PROVIDER_SITE_OTHER): Payer: Self-pay | Admitting: Ophthalmology

## 2021-04-03 DIAGNOSIS — M4727 Other spondylosis with radiculopathy, lumbosacral region: Secondary | ICD-10-CM | POA: Diagnosis not present

## 2021-04-03 DIAGNOSIS — M9901 Segmental and somatic dysfunction of cervical region: Secondary | ICD-10-CM | POA: Diagnosis not present

## 2021-04-03 DIAGNOSIS — M47894 Other spondylosis, thoracic region: Secondary | ICD-10-CM | POA: Diagnosis not present

## 2021-04-03 DIAGNOSIS — M9903 Segmental and somatic dysfunction of lumbar region: Secondary | ICD-10-CM | POA: Diagnosis not present

## 2021-04-03 DIAGNOSIS — M9902 Segmental and somatic dysfunction of thoracic region: Secondary | ICD-10-CM | POA: Diagnosis not present

## 2021-04-03 DIAGNOSIS — M47892 Other spondylosis, cervical region: Secondary | ICD-10-CM | POA: Diagnosis not present

## 2021-05-06 DIAGNOSIS — M5388 Other specified dorsopathies, sacral and sacrococcygeal region: Secondary | ICD-10-CM | POA: Diagnosis not present

## 2021-05-06 DIAGNOSIS — M47892 Other spondylosis, cervical region: Secondary | ICD-10-CM | POA: Diagnosis not present

## 2021-05-06 DIAGNOSIS — M4727 Other spondylosis with radiculopathy, lumbosacral region: Secondary | ICD-10-CM | POA: Diagnosis not present

## 2021-05-06 DIAGNOSIS — M9903 Segmental and somatic dysfunction of lumbar region: Secondary | ICD-10-CM | POA: Diagnosis not present

## 2021-05-06 DIAGNOSIS — M9901 Segmental and somatic dysfunction of cervical region: Secondary | ICD-10-CM | POA: Diagnosis not present

## 2021-05-06 DIAGNOSIS — M9904 Segmental and somatic dysfunction of sacral region: Secondary | ICD-10-CM | POA: Diagnosis not present

## 2021-06-05 DIAGNOSIS — M9903 Segmental and somatic dysfunction of lumbar region: Secondary | ICD-10-CM | POA: Diagnosis not present

## 2021-06-05 DIAGNOSIS — M9901 Segmental and somatic dysfunction of cervical region: Secondary | ICD-10-CM | POA: Diagnosis not present

## 2021-06-05 DIAGNOSIS — M5388 Other specified dorsopathies, sacral and sacrococcygeal region: Secondary | ICD-10-CM | POA: Diagnosis not present

## 2021-06-05 DIAGNOSIS — M47892 Other spondylosis, cervical region: Secondary | ICD-10-CM | POA: Diagnosis not present

## 2021-06-05 DIAGNOSIS — M4727 Other spondylosis with radiculopathy, lumbosacral region: Secondary | ICD-10-CM | POA: Diagnosis not present

## 2021-06-05 DIAGNOSIS — M9904 Segmental and somatic dysfunction of sacral region: Secondary | ICD-10-CM | POA: Diagnosis not present

## 2021-07-04 DIAGNOSIS — M5388 Other specified dorsopathies, sacral and sacrococcygeal region: Secondary | ICD-10-CM | POA: Diagnosis not present

## 2021-07-04 DIAGNOSIS — M4724 Other spondylosis with radiculopathy, thoracic region: Secondary | ICD-10-CM | POA: Diagnosis not present

## 2021-07-04 DIAGNOSIS — M9904 Segmental and somatic dysfunction of sacral region: Secondary | ICD-10-CM | POA: Diagnosis not present

## 2021-07-04 DIAGNOSIS — M47817 Spondylosis without myelopathy or radiculopathy, lumbosacral region: Secondary | ICD-10-CM | POA: Diagnosis not present

## 2021-07-04 DIAGNOSIS — M9902 Segmental and somatic dysfunction of thoracic region: Secondary | ICD-10-CM | POA: Diagnosis not present

## 2021-07-04 DIAGNOSIS — M9903 Segmental and somatic dysfunction of lumbar region: Secondary | ICD-10-CM | POA: Diagnosis not present

## 2021-07-17 ENCOUNTER — Other Ambulatory Visit: Payer: Self-pay

## 2021-07-18 ENCOUNTER — Other Ambulatory Visit: Payer: Self-pay

## 2021-07-18 ENCOUNTER — Ambulatory Visit (INDEPENDENT_AMBULATORY_CARE_PROVIDER_SITE_OTHER): Payer: Medicare Other | Admitting: Physician Assistant

## 2021-07-18 ENCOUNTER — Encounter: Payer: Self-pay | Admitting: Physician Assistant

## 2021-07-18 VITALS — BP 120/65 | HR 57 | Temp 97.5°F | Ht 63.0 in | Wt 163.0 lb

## 2021-07-18 DIAGNOSIS — R7303 Prediabetes: Secondary | ICD-10-CM | POA: Diagnosis not present

## 2021-07-18 DIAGNOSIS — I10 Essential (primary) hypertension: Secondary | ICD-10-CM

## 2021-07-18 DIAGNOSIS — Z23 Encounter for immunization: Secondary | ICD-10-CM | POA: Diagnosis not present

## 2021-07-18 DIAGNOSIS — Z0000 Encounter for general adult medical examination without abnormal findings: Secondary | ICD-10-CM | POA: Diagnosis not present

## 2021-07-18 DIAGNOSIS — E559 Vitamin D deficiency, unspecified: Secondary | ICD-10-CM | POA: Diagnosis not present

## 2021-07-18 DIAGNOSIS — E782 Mixed hyperlipidemia: Secondary | ICD-10-CM | POA: Diagnosis not present

## 2021-07-18 NOTE — Progress Notes (Signed)
Subjective:   Kelli [name] is a 80 y.o. female who presents for Medicare Annual (Subsequent) preventive examination.  Review of Systems    General:   No F/C, wt loss Pulm:   No DIB, SOB, pleuritic chest pain Card:  No CP, + brief, rare palpitations Abd:  No n/v/d; intermittent epigastric pain from acid reflux Ext:  No iedema    Objective:    Today's Vitals   07/18/21 0856  BP: 120/65  Pulse: (!) 57  Temp: (!) 97.5 F (36.4 C)  SpO2: 100%  Weight: 163 lb (73.9 kg)  Height: 5\' 3"  (1.6 m)   Body mass index is 28.87 kg/m.  Advanced Directives 06/15/2019 12/24/2013 03/09/2013  Does Patient Have a Medical Advance Directive? Yes Patient has advance directive, copy not in chart Patient has advance directive, copy not in chart  Type of Advance Directive - Living will Living will;Healthcare Power of Attorney  Does patient want to make changes to medical advance directive? - No change requested -  Copy of [place] in Chart? - Copy requested from family Copy requested from family  Pre-existing out of facility DNR order (yellow form or pink MOST form) - No -    Current Medications (verified) Outpatient Encounter Medications as of 07/18/2021  Medication Sig   acetaminophen (TYLENOL) 500 MG tablet Take 500 mg by mouth daily as needed for pain.   atorvastatin (LIPITOR) 10 MG tablet Take 5 mg by mouth daily.    Calcium Carb-Cholecalciferol (CALCIUM + D3) 600-200 MG-UNIT TABS Take 1 tablet by mouth daily.   Cholecalciferol (VITAMIN D) 400 UNITS capsule Take 400 Units by mouth daily.   Docusate Calcium (STOOL SOFTENER PO) Take by mouth at bedtime.   ELDERBERRY PO Take by mouth.   ELIQUIS 5 MG TABS tablet TAKE 1 TABLET BY MOUTH 2 TIMES DAILY.   FIBER ADULT GUMMIES PO Take 1 Dose by mouth daily.   fish oil-omega-3 fatty acids 1000 MG capsule Take 1 g by mouth daily.   Flaxseed, Linseed, (FLAX SEED OIL PO) Take by mouth daily.   fluticasone (FLONASE) 50 MCG/ACT nasal  spray Place 1 spray into both nostrils daily as needed.    hydrochlorothiazide (HYDRODIURIL) 12.5 MG tablet Take 1 tablet (12.5 mg total) by mouth daily.   losartan (COZAAR) 50 MG tablet Take 1 tablet (50 mg total) by mouth daily.   Magnesium 300 MG CAPS Take 1 capsule by mouth daily.   metoprolol tartrate (LOPRESSOR) 50 MG tablet TAKE 1 TABLET BY MOUTH IN THE MORNING AND TAKE 1/2 TABLET IN THE AFTERNOON.   Probiotic Product (PROBIOTIC DAILY PO) Take 1 tablet by mouth daily as needed.    vitamin C (ASCORBIC ACID) 500 MG tablet Take 500 mg by mouth daily.   No facility-administered encounter medications on file as of 07/18/2021.    Allergies (verified) Amoxicillin and Augmentin [amoxicillin-pot clavulanate]   History: Past Medical History:  Diagnosis Date   Allergy    Phreesia 07/04/2020   Arthritis    [employer]reesia 07/04/2020   Atrial fibrillation ([place]) 02/2013   paroxsymal; c/b Type 2 NSTEMI; CHADS2-VASc=2 (age, sex - patient had come off anti-HTN meds in past); Eliquis started;  Echocardiogram 03/10/13: EF 02-72%, grade 1 diastolic dysfunction, mild MR, mild LAE   GERD (gastroesophageal reflux disease)    [employer]reesia 07/04/2020   Hyperlipidemia    Hypertension    Myocardial infarction [facility])    Phreesia 07/04/2020   NSTEMI (non-ST elevated myocardial infarction) ([place]) 02/2013  demand ischemia in setting of AFib with RVR; ETT-Myoview 03/11/13: No ischemia or scar, EF 70%.   Past Surgical History:  Procedure Laterality Date   APPENDECTOMY     CATARACT EXTRACTION     CATARACT EXTRACTION, BILATERAL     EYE SURGERY     STRABISMUS SURGERY     TONSILLECTOMY     YAG LASER APPLICATION     Family History  Problem Relation Age of Onset   Heart attack Mother 60   Cancer Maternal Aunt        throat   Social History   Socioeconomic History   Marital status: Widowed    Spouse name: Not on file   Number of children: 3   Years of education: Not on file   Highest education level: Not on  file  Occupational History   Occupation: Surveyor, quantity- Day care    Employer: [employer]  Tobacco Use   Smoking status: Never   Smokeless tobacco: Never  Vaping Use   Vaping Use: Never used  Substance and Sexual Activity   Alcohol use: Yes    Comment: 1-2 glasses of wine occ.   Drug use: No   Sexual activity: Not Currently  Other Topics Concern   Not on file  Social History Narrative   Not on file   Social Determinants of Health   Financial Resource Strain: Not on file  Food Insecurity: Not on file  Transportation Needs: Not on file  Physical Activity: Not on file  Stress: Not on file  Social Connections: Not on file    Tobacco Counseling Counseling given: Not Answered    Diabetic? no     Activities of Daily Living In your present state of health, do you have any difficulty performing the following activities: 07/18/2021  Hearing? N  Vision? N  Difficulty concentrating or making decisions? N  Walking or climbing stairs? N  Dressing or bathing? N  Doing errands, shopping? N  Some recent data might be hidden    Patient Care Team: Lorrene Reid, PA-C as PCP - General [place], Peter M, MD as PCP - Cardiology (Cardiology) [place]ique, Peter M, MD as Consulting Physician (Cardiology) Allyn Kenner, MD as Consulting Physician (Dermatology) Wonda Horner, MD as Consulting Physician (Gastroenterology) Care, [facility]  Indicate any recent Medical Services you may have received from other than [facility] providers in the past year (date may be approximate).     Assessment:   This is a routine wellness examination for Kelli [name].  Hearing/Vision screen No results found.  Dietary issues and exercise activities discussed: -Low far and carbohydrate. Continue to stay active with work at [employer] and house work.     Goals Addressed   None   Depression Screen PHQ 2/9 Scores 07/18/2021 07/05/2020 02/09/2020 09/21/2019 06/15/2019 02/28/2019 11/19/2018  PHQ - 2  Score 0 0 0 0 0 0 0  PHQ- 9 Score 0 1 0 1 2 0 1    Fall Risk Fall Risk  07/18/2021 07/05/2020 06/15/2019 08/09/2018 04/07/2018  Falls in the past year? 0 0 1 0 No  Number falls in past yr: 0 - 0 - -  Injury with Fall? 0 - 1 - -  Risk for fall due to : - No Fall Risks - - -  Follow up Falls evaluation completed Falls evaluation completed - - -    FALL RISK PREVENTION PERTAINING TO THE HOME:  Any stairs in or around the home? No  If so, are there  any without handrails? No  Home free of loose throw rugs in walkways, pet beds, electrical cords, etc? No  Adequate lighting in your home to reduce risk of falls? Yes   ASSISTIVE DEVICES UTILIZED TO PREVENT FALLS:  Life alert? No  Use of a cane, walker or w/c? No  Grab bars in the bathroom? Yes  Shower chair or bench in shower? No  Elevated toilet seat or a handicapped toilet? Yes   TIMED UP AND GO:  Was the test performed? Yes .  Length of time to ambulate 10 feet: 10 sec.   Gait steady and fast without use of assistive device  Cognitive Function: wnl's  6CIT Screen 07/05/2020 06/15/2019 12/22/2017  What Year? 0 points 0 points 0 points  What month? 0 points 0 points 0 points  What time? 0 points 0 points 0 points  Count back from 20 0 points 0 points 0 points  Months in reverse 0 points 0 points 0 points  Repeat phrase 0 points 0 points 0 points  Total Score 0 0 0    Immunizations Immunization History  Administered Date(s) Administered   Fluad Quad(high Dose 65+) 08/16/2019   Influenza, High Dose Seasonal PF 08/09/2018   Moderna Sars-Covid-2 Vaccination 10/22/2019, 11/08/2019   Pneumococcal Polysaccharide-23 06/12/2009   Tdap 07/16/2011    TDAP status: Due, Education has been provided regarding the importance of this vaccine. Advised may receive this vaccine at local pharmacy or Health Dept. Aware to provide a copy of the vaccination record if obtained from local pharmacy or Health Dept. Verbalized acceptance and  understanding.  Flu Vaccine status: Completed at today's visit  Pneumococcal vaccine status: Due, Education has been provided regarding the importance of this vaccine. Advised may receive this vaccine at local pharmacy or Health Dept. Aware to provide a copy of the vaccination record if obtained from local pharmacy or Health Dept. Verbalized acceptance and understanding.  Covid-19 vaccine status: Completed vaccines  Qualifies for Shingles Vaccine? Yes   Zostavax completed No   Shingrix Completed?: No.    Education has been provided regarding the importance of this vaccine. Patient has been advised to call insurance company to determine out of pocket expense if they have not yet received this vaccine. Advised may also receive vaccine at local pharmacy or Health Dept. Verbalized acceptance and understanding.  Screening Tests Health Maintenance  Topic Date Due   Zoster Vaccines- Shingrix (1 of 2) Never done   Pneumonia Vaccine 19+ Years old (2 - PCV) 06/12/2010   COVID-19 Vaccine (3 - Mixed Product risk series) 12/06/2019   INFLUENZA VACCINE  04/15/2021   TETANUS/TDAP  07/15/2021   DEXA SCAN  Completed   HPV VACCINES  Aged Out    Health Maintenance  Health Maintenance Due  Topic Date Due   Zoster Vaccines- Shingrix (1 of 2) Never done   Pneumonia Vaccine 59+ Years old (2 - PCV) 06/12/2010   COVID-19 Vaccine (3 - Mixed Product risk series) 12/06/2019   INFLUENZA VACCINE  04/15/2021   TETANUS/TDAP  07/15/2021    Colorectal cancer screening: No longer required.   Mammogram status: No longer required due to age out.  Bone Density status: Completed 2020. Results reflect: Bone density results: OSTEOPENIA. Repeat every 3 years.  Lung Cancer Screening: (Low Dose CT Chest recommended if Age 66-80 years, 30 pack-year currently smoking OR have quit w/in 15years.) does not qualify.   Lung Cancer Screening Referral: no  Additional Screening:  Hepatitis C Screening: does not qualify;  Completed n/a  Vision Screening: Recommended annual ophthalmology exams for early detection of glaucoma and other disorders of the eye. Is the patient up to date with their annual eye exam?  Yes  Who is the provider or what is the name of the office in which the patient attends annual eye exams? Dr. Marica Otter If pt is not established with a provider, would they like to be referred to a provider to establish care? No .   Dental Screening: Recommended annual dental exams for proper oral hygiene  Community Resource Referral / Chronic Care Management: CRR required this visit?  No   CCM required this visit?  No     Plan:  -Will obtain fasting labs. -Patient requested regular influenza vaccine.  -Continue current medication regimen. -Continue to follow up with various specialists.  -Follow up in 6 months for HTN, HLD  I have personally reviewed and noted the following in the patient's chart:   Medical and social history Use of alcohol, tobacco or illicit drugs  Current medications and supplements including opioid prescriptions.  Functional ability and status Nutritional status Physical activity Advanced directives List of other physicians Hospitalizations, surgeries, and ER visits in previous 12 months Vitals Screenings to include cognitive, depression, and falls Referrals and appointments  In addition, I have reviewed and discussed with patient certain preventive protocols, quality metrics, and best practice recommendations. A written personalized care plan for preventive services as well as general preventive health recommendations were provided to patient.     Lorrene Reid, PA-C  07/18/2021

## 2021-07-18 NOTE — Patient Instructions (Signed)
Preventive Care 40 Years and Older, Female Preventive care refers to lifestyle choices and visits with your health care provider that can promote health and wellness. This includes: A yearly physical exam. This is also called an annual wellness visit. Regular dental and eye exams. Immunizations. Screening for certain conditions. Healthy lifestyle choices, such as: Eating a healthy diet. Getting regular exercise. Not using drugs or products that contain nicotine and tobacco. Limiting alcohol use. What can I expect for my preventive care visit? Physical exam Your health care provider will check your: Height and weight. These may be used to calculate your BMI (body mass index). BMI is a measurement that tells if you are at a healthy weight. Heart rate and blood pressure. Body temperature. Skin for abnormal spots. Counseling Your health care provider may ask you questions about your: Past medical problems. Family's medical history. Alcohol, tobacco, and drug use. Emotional well-being. Home life and relationship well-being. Sexual activity. Diet, exercise, and sleep habits. History of falls. Memory and ability to understand (cognition). Work and work Statistician. Pregnancy and menstrual history. Access to firearms. What immunizations do I need? Vaccines are usually given at various ages, according to a schedule. Your health care provider will recommend vaccines for you based on your age, medical history, and lifestyle or other factors, such as travel or where you work. What tests do I need? Blood tests Lipid and cholesterol levels. These may be checked every 5 years, or more often depending on your overall health. Hepatitis C test. Hepatitis B test. Screening Lung cancer screening. You may have this screening every year starting at age 33 if you have a 30-pack-year history of smoking and currently smoke or have quit within the past 15 years. Colorectal cancer screening. All  adults should have this screening starting at age 73 and continuing until age 9. Your health care provider may recommend screening at age 31 if you are at increased risk. You will have tests every 1-10 years, depending on your results and the type of screening test. Diabetes screening. This is done by checking your blood sugar (glucose) after you have not eaten for a while (fasting). You may have this done every 1-3 years. Mammogram. This may be done every 1-2 years. Talk with your health care provider about how often you should have regular mammograms. Abdominal aortic aneurysm (AAA) screening. You may need this if you are a current or former smoker. BRCA-related cancer screening. This may be done if you have a family history of breast, ovarian, tubal, or peritoneal cancers. Other tests STD (sexually transmitted disease) testing, if you are at risk. Bone density scan. This is done to screen for osteoporosis. You may have this done starting at age 43. Talk with your health care provider about your test results, treatment options, and if necessary, the need for more tests. Follow these instructions at home: Eating and drinking  Eat a diet that includes fresh fruits and vegetables, whole grains, lean protein, and low-fat dairy products. Limit your intake of foods with high amounts of sugar, saturated fats, and salt. Take vitamin and mineral supplements as recommended by your health care provider. Do not drink alcohol if your health care provider tells you not to drink. If you drink alcohol: Limit how much you have to 0-1 drink a day. Be aware of how much alcohol is in your drink. In the [place] one drink equals one 12 oz bottle of beer (355 mL), one 5 oz glass of wine (148 mL), or one  1 oz glass of hard liquor (44 mL). Lifestyle Take daily care of your teeth and gums. Brush your teeth every morning and night with fluoride toothpaste. Floss one time each day. Stay active. Exercise for at least  30 minutes 5 or more days each week. Do not use any products that contain nicotine or tobacco, such as cigarettes, e-cigarettes, and chewing tobacco. If you need help quitting, ask your health care provider. Do not use drugs. If you are sexually active, practice safe sex. Use a condom or other form of protection in order to prevent STIs (sexually transmitted infections). Talk with your health care provider about taking a low-dose aspirin or statin. Find healthy ways to cope with stress, such as: Meditation, yoga, or listening to music. Journaling. Talking to a trusted person. Spending time with friends and family. Safety Always wear your seat belt while driving or riding in a vehicle. Do not drive: If you have been drinking alcohol. Do not ride with someone who has been drinking. When you are tired or distracted. While texting. Wear a helmet and other protective equipment during sports activities. If you have firearms in your house, make sure you follow all gun safety procedures. What's next? Visit your health care provider once a year for an annual wellness visit. Ask your health care provider how often you should have your eyes and teeth checked. Stay up to date on all vaccines. This information is not intended to replace advice given to you by your health care provider. Make sure you discuss any questions you have with your health care provider. Document Revised: 11/09/2020 Document Reviewed: 08/26/2018 Elsevier Patient Education  2022 [employer].

## 2021-07-18 NOTE — Addendum Note (Signed)
Addended by: Vivia [place] on: 07/18/2021 10:03 AM   Modules accepted: Orders

## 2021-07-19 LAB — CBC WITH DIFFERENTIAL/PLATELET
Basophils Absolute: 0.1 10*3/uL (ref 0.0–0.2)
Basos: 1 %
EOS (ABSOLUTE): 0.2 10*3/uL (ref 0.0–0.4)
Eos: 3 %
Hematocrit: 41.2 % (ref 34.0–46.6)
Hemoglobin: 13.7 g/dL (ref 11.1–15.9)
Immature Grans (Abs): 0 10*3/uL (ref 0.0–0.1)
Immature Granulocytes: 0 %
Lymphocytes Absolute: 1.8 10*3/uL (ref 0.7–3.1)
Lymphs: 25 %
MCH: 30.5 pg (ref 26.6–33.0)
MCHC: 33.3 g/dL (ref 31.5–35.7)
MCV: 92 fL (ref 79–97)
Monocytes Absolute: 0.6 10*3/uL (ref 0.1–0.9)
Monocytes: 9 %
Neutrophils Absolute: 4.5 10*3/uL (ref 1.4–7.0)
Neutrophils: 62 %
Platelets: 284 10*3/uL (ref 150–450)
RBC: 4.49 x10E6/uL (ref 3.77–5.28)
RDW: 11.9 % (ref 11.7–15.4)
WBC: 7.3 10*3/uL (ref 3.4–10.8)

## 2021-07-19 LAB — COMPREHENSIVE METABOLIC PANEL
ALT: 20 IU/L (ref 0–32)
AST: 29 IU/L (ref 0–40)
Albumin/Globulin Ratio: 1.4 (ref 1.2–2.2)
Albumin: 4 g/dL (ref 3.6–4.6)
Alkaline Phosphatase: 85 IU/L (ref 44–121)
BUN/Creatinine Ratio: 20 (ref 12–28)
BUN: 22 mg/dL (ref 8–27)
Bilirubin Total: 0.5 mg/dL (ref 0.0–1.2)
CO2: 25 mmol/L (ref 20–29)
Calcium: 9.2 mg/dL (ref 8.7–10.3)
Chloride: 100 mmol/L (ref 96–106)
Creatinine, Ser: 1.08 mg/dL — ABNORMAL HIGH (ref 0.57–1.00)
Globulin, Total: 2.9 g/dL (ref 1.5–4.5)
Glucose: 86 mg/dL (ref 70–99)
Potassium: 4.9 mmol/L (ref 3.5–5.2)
Sodium: 137 mmol/L (ref 134–144)
Total Protein: 6.9 g/dL (ref 6.0–8.5)
eGFR: 52 mL/min/{1.73_m2} — ABNORMAL LOW (ref >59)

## 2021-07-19 LAB — LIPID PANEL
Chol/HDL Ratio: 3.2 ratio (ref 0.0–4.4)
Cholesterol, Total: 177 mg/dL (ref 100–199)
HDL: 55 mg/dL (ref >39)
LDL Chol Calc (NIH): 110 mg/dL — ABNORMAL HIGH (ref 0–99)
Triglycerides: 63 mg/dL (ref 0–149)
VLDL Cholesterol Cal: 12 mg/dL (ref 5–40)

## 2021-07-19 LAB — HEMOGLOBIN A1C
Est. average glucose Bld gHb Est-mCnc: 128 mg/dL
Hgb A1c MFr Bld: 6.1 % — ABNORMAL HIGH (ref 4.8–5.6)

## 2021-07-19 LAB — VITAMIN D 25 HYDROXY (VIT D DEFICIENCY, FRACTURES): Vit D, 25-Hydroxy: 68.1 ng/mL (ref 30.0–100.0)

## 2021-07-19 LAB — TSH: TSH: 1.36 u[IU]/mL (ref 0.450–4.500)

## 2021-07-24 ENCOUNTER — Other Ambulatory Visit: Payer: Self-pay | Admitting: Cardiology

## 2021-08-05 DIAGNOSIS — M47893 Other spondylosis, cervicothoracic region: Secondary | ICD-10-CM | POA: Diagnosis not present

## 2021-08-05 DIAGNOSIS — M9903 Segmental and somatic dysfunction of lumbar region: Secondary | ICD-10-CM | POA: Diagnosis not present

## 2021-08-05 DIAGNOSIS — M5388 Other specified dorsopathies, sacral and sacrococcygeal region: Secondary | ICD-10-CM | POA: Diagnosis not present

## 2021-08-05 DIAGNOSIS — M9902 Segmental and somatic dysfunction of thoracic region: Secondary | ICD-10-CM | POA: Diagnosis not present

## 2021-08-05 DIAGNOSIS — M4727 Other spondylosis with radiculopathy, lumbosacral region: Secondary | ICD-10-CM | POA: Diagnosis not present

## 2021-08-05 DIAGNOSIS — M9904 Segmental and somatic dysfunction of sacral region: Secondary | ICD-10-CM | POA: Diagnosis not present

## 2021-09-03 DIAGNOSIS — M4727 Other spondylosis with radiculopathy, lumbosacral region: Secondary | ICD-10-CM | POA: Diagnosis not present

## 2021-09-03 DIAGNOSIS — M9903 Segmental and somatic dysfunction of lumbar region: Secondary | ICD-10-CM | POA: Diagnosis not present

## 2021-09-03 DIAGNOSIS — M9902 Segmental and somatic dysfunction of thoracic region: Secondary | ICD-10-CM | POA: Diagnosis not present

## 2021-09-03 DIAGNOSIS — M5388 Other specified dorsopathies, sacral and sacrococcygeal region: Secondary | ICD-10-CM | POA: Diagnosis not present

## 2021-09-03 DIAGNOSIS — M9904 Segmental and somatic dysfunction of sacral region: Secondary | ICD-10-CM | POA: Diagnosis not present

## 2021-09-03 DIAGNOSIS — M47893 Other spondylosis, cervicothoracic region: Secondary | ICD-10-CM | POA: Diagnosis not present

## 2021-09-05 ENCOUNTER — Encounter: Payer: Self-pay | Admitting: Nurse Practitioner

## 2021-09-05 ENCOUNTER — Other Ambulatory Visit: Payer: Self-pay

## 2021-09-05 ENCOUNTER — Ambulatory Visit (INDEPENDENT_AMBULATORY_CARE_PROVIDER_SITE_OTHER): Payer: Medicare Other | Admitting: Nurse Practitioner

## 2021-09-05 VITALS — Temp 97.4°F | Ht 63.0 in | Wt 163.0 lb

## 2021-09-05 DIAGNOSIS — J300 Vasomotor rhinitis: Secondary | ICD-10-CM

## 2021-09-05 DIAGNOSIS — J0140 Acute pansinusitis, unspecified: Secondary | ICD-10-CM | POA: Insufficient documentation

## 2021-09-05 MED ORDER — FLUTICASONE PROPIONATE 50 MCG/ACT NA SUSP: 2.0000 | Freq: Every day | NASAL | 6 refills | Status: AC

## 2021-09-05 MED ORDER — AZITHROMYCIN 250 MG PO TABS: ORAL_TABLET | ORAL | 0 refills | Status: DC

## 2021-09-05 NOTE — Progress Notes (Signed)
Virtual Visit via Telephone Note  I connected with Kelli [name] on 09/05/21 at  1:10 PM EST by telephone and verified that I am speaking with the correct person using two identifiers.  Location: Patient: home Provider: home office    I discussed the limitations, risks, security and privacy concerns of performing an evaluation and management service by telephone and the availability of in person appointments. I also discussed with the patient that there may be a patient responsible charge related to this service. The patient expressed understanding and agreed to proceed.   History of Present Illness: The patient states that he has been having nasal and sinus congestion. This has been going on for several days. She has pain across her eyes and her forehead. She states that her throat was sore initially. Her voice is hoarse. She denies fever, chills, or body aches. She denies nausea. States that she has been taking OTC tylenol and coricidin. These do help her symptoms for a short time. When medication wears off, symptoms come right back. She states that symptoms are unchanged and may be getting a little worse.    Observations/Objective:   The patient is alert and oriented. She is pleasant and answers all questions appropriately. Breathing is non-labored. She is in no acute distress at this time.  She has nasal congestion and her voice is hoarse.   Today's Vitals   09/05/21 1131  Temp: (!) 97.4 F (36.3 C)  Weight: 163 lb (73.9 kg)  Height: 5\' 3"  (1.6 m)   Body mass index is 28.87 kg/m.   Assessment and Plan:  1. Acute non-recurrent pansinusitis Start x-pack. Take as directed for 5 days. Rest and increase fluids. Continue using OTC medication to control symptoms.   - azithromycin (ZITHROMAX) 250 MG tablet; z-pack - take as directed for 5 days  Dispense: 6 tablet; Refill: 0  2. Vasomotor rhinitis Add flonase nasal spray. Use two sprays in both nostrils daily.  - fluticasone (FLONASE)  50 MCG/ACT nasal spray; Place 2 sprays into both nostrils daily.  Dispense: 16 g; Refill: 6   Follow Up Instructions:    I discussed the assessment and treatment plan with the patient. The patient was provided an opportunity to ask questions and all were answered. The patient agreed with the plan and demonstrated an understanding of the instructions.   The patient was advised to call back or seek an in-person evaluation if the symptoms worsen or if the condition fails to improve as anticipated.  I provided 10 minutes of non-face-to-face time during this encounter.   Ronnell Freshwater, NP

## 2021-09-23 ENCOUNTER — Other Ambulatory Visit: Payer: Self-pay | Admitting: Cardiology

## 2021-09-23 ENCOUNTER — Other Ambulatory Visit: Payer: Self-pay | Admitting: Physician Assistant

## 2021-09-23 NOTE — Telephone Encounter (Signed)
Prescription refill request for Eliquis received. Indication:Afib Last office visit:6/22 Scr:1.0 Age: 81 Weight:73.9 kg  Prescription refilled

## 2021-09-24 NOTE — Progress Notes (Shared)
[facility] Note  09/27/2021     CHIEF COMPLAINT Patient presents for No chief complaint on file.    HISTORY OF PRESENT ILLNESS: Kelli [name] is a 81 y.o. female who presents to the clinic today for:    Pt is here on the referral of Dr. Ammie Ferrier, she states she went to see her for a routnine eye exam bc she felt like her right eye vision is blurrier than normal, pt has had strabismus sx twice at [facility], once when she was very young and once when she was 56, pt has also had cataract sx with Dr. Herbert Deaner, pt states Dr. Sabra Heck was concerned with macular fluid and just wanted a second opinion, she states Dr. Sabra Heck also told her she has early macular degeneration  Referring physician: Lorrene Reid, PA-C [address] [place],  [place] [address]  HISTORICAL INFORMATION:   Selected notes from the MEDICAL RECORD NUMBER Referred by Dr. Marica Otter for concern of macular fluid LEE:  Ocular Hx- PMH-    CURRENT MEDICATIONS: No current outpatient medications on file. (Ophthalmic Drugs)   No current facility-administered medications for this visit. (Ophthalmic Drugs)   Current Outpatient Medications (Other)  Medication Sig   acetaminophen (TYLENOL) 500 MG tablet Take 500 mg by mouth daily as needed for pain.   atorvastatin (LIPITOR) 10 MG tablet TAKE 1 TABLET BY MOUTH ONCE DAILY   azithromycin (ZITHROMAX) 250 MG tablet z-pack - take as directed for 5 days   Calcium Carb-Cholecalciferol (CALCIUM + D3) 600-200 MG-UNIT TABS Take 1 tablet by mouth daily.   Cholecalciferol (VITAMIN D) 400 UNITS capsule Take 400 Units by mouth daily.   Docusate Calcium (STOOL SOFTENER PO) Take by mouth at bedtime.   ELDERBERRY PO Take by mouth.   ELIQUIS 5 MG TABS tablet TAKE 1 TABLET BY MOUTH 2 TIMES DAILY.   FIBER ADULT GUMMIES PO Take 1 Dose by mouth daily.   fish oil-omega-3 fatty acids 1000 MG capsule Take 1 g by mouth daily.   Flaxseed, Linseed, (FLAX SEED OIL  PO) Take by mouth daily.   fluticasone (FLONASE) 50 MCG/ACT nasal spray Place 2 sprays into both nostrils daily.   hydrochlorothiazide (HYDRODIURIL) 12.5 MG tablet Take 1 tablet (12.5 mg total) by mouth daily.   losartan (COZAAR) 50 MG tablet Take 1 tablet (50 mg total) by mouth daily.   Magnesium 300 MG CAPS Take 1 capsule by mouth daily.   metoprolol tartrate (LOPRESSOR) 50 MG tablet TAKE 1 TABLET BY MOUTH IN THE MORNING AND TAKE 1/2 TABLET IN THE AFTERNOON.   Probiotic Product (PROBIOTIC DAILY PO) Take 1 tablet by mouth daily as needed.    vitamin C (ASCORBIC ACID) 500 MG tablet Take 500 mg by mouth daily.   No current facility-administered medications for this visit. (Other)      REVIEW OF SYSTEMS:     ALLERGIES Allergies  Allergen Reactions   Amoxicillin Rash   Augmentin [Amoxicillin-Pot Clavulanate] Itching and Rash    PAST MEDICAL HISTORY Past Medical History:  Diagnosis Date   Allergy    Phreesia 07/04/2020   Arthritis    Phreesia 07/04/2020   Atrial fibrillation ([place]) 02/2013   paroxsymal; c/b Type 2 NSTEMI; CHADS2-VASc=2 (age, sex - patient had come off anti-HTN meds in past); Eliquis started;  Echocardiogram 03/10/13: EF 50-09%, grade 1 diastolic dysfunction, mild MR, mild LAE   GERD (gastroesophageal reflux disease)    Phreesia 07/04/2020   Hyperlipidemia  Hypertension    Myocardial infarction [facility])    Phreesia 07/04/2020   NSTEMI (non-ST elevated myocardial infarction) ([place]) 02/2013   demand ischemia in setting of AFib with RVR; ETT-Myoview 03/11/13: No ischemia or scar, EF 70%.   Past Surgical History:  Procedure Laterality Date   APPENDECTOMY     CATARACT EXTRACTION     CATARACT EXTRACTION, BILATERAL     EYE SURGERY     STRABISMUS SURGERY     TONSILLECTOMY     YAG LASER APPLICATION      FAMILY HISTORY Family History  Problem Relation Age of Onset   Heart attack Mother 103   Cancer Maternal Aunt        throat    SOCIAL HISTORY Social  History   Tobacco Use   Smoking status: Never   Smokeless tobacco: Never  Vaping Use   Vaping Use: Never used  Substance Use Topics   Alcohol use: Yes    Comment: 1-2 glasses of wine occ.   Drug use: No         OPHTHALMIC EXAM:  Not recorded     IMAGING AND PROCEDURES  Imaging and Procedures for 09/27/2021          ASSESSMENT/PLAN:    ICD-10-CM   1. Early dry stage nonexudative age-related macular degeneration of both eyes  H35.3131     2. Retinal edema  H35.81     3. Essential hypertension  I10     4. Hypertensive retinopathy of both eyes  H35.033     5. Pseudophakia, both eyes  Z96.1     6. History of strabismus surgery  Z98.890        1,2. Age related macular degeneration, non-exudative, both eyes  - early stage -- mild drusen  - no exudative disease  - The incidence, anatomy, and pathology of dry AMD, risk of progression, and the AREDS and AREDS 2 study including smoking risks discussed with patient.   - Recommend amsler grid monitoring  - f/u 3-4 months, DFE, OCT  3,4. Hypertensive retinopathy OU - discussed importance of tight BP control - monitor   5. Pseudophakia OU  - s/p CE/IOL (Dr. Herbert Deaner)  - IOL in good position, doing well  - monitor   6. Hx of Strabismus OU   Ophthalmic Meds Ordered this visit:  No orders of the defined types were placed in this encounter.       No follow-ups on file.  There are no Patient Instructions on file for this visit.   Explained the diagnoses, plan, and follow up with the patient and they expressed understanding.  Patient expressed understanding of the importance of proper follow up care.   This document serves as a record of services personally performed by Gardiner Sleeper, MD, PhD. It was created on their behalf by Leonie Douglas, an ophthalmic technician. The creation of this record is the provider's dictation and/or activities during the visit.    Electronically signed by: Leonie Douglas COA,  09/24/21  12:25 PM   Gardiner Sleeper, M.D., Ph.D. Diseases & Surgery of the Retina and Vitreous [facility] 09/27/2021   Abbreviations: M myopia (nearsighted); A astigmatism; H hyperopia (farsighted); P presbyopia; Mrx spectacle prescription;  CTL contact lenses; OD right eye; OS left eye; OU both eyes  XT exotropia; ET esotropia; PEK punctate epithelial keratitis; PEE punctate epithelial erosions; DES dry eye syndrome; MGD meibomian gland dysfunction; ATs artificial tears; PFAT's preservative free artificial tears; [place] nuclear  sclerotic cataract; PSC posterior subcapsular cataract; ERM epi-retinal membrane; PVD posterior vitreous detachment; RD retinal detachment; DM diabetes mellitus; DR diabetic retinopathy; NPDR non-proliferative diabetic retinopathy; PDR proliferative diabetic retinopathy; CSME clinically significant macular edema; DME diabetic macular edema; dbh dot blot hemorrhages; CWS cotton wool spot; POAG primary open angle glaucoma; C/D cup-to-disc ratio; HVF humphrey visual field; GVF goldmann visual field; OCT optical coherence tomography; IOP intraocular pressure; BRVO Branch retinal vein occlusion; CRVO central retinal vein occlusion; CRAO central retinal artery occlusion; BRAO branch retinal artery occlusion; RT retinal tear; SB scleral buckle; PPV pars plana vitrectomy; VH Vitreous hemorrhage; PRP panretinal laser photocoagulation; IVK intravitreal kenalog; VMT vitreomacular traction; MH Macular hole;  NVD neovascularization of the disc; NVE neovascularization elsewhere; AREDS age related eye disease study; ARMD age related macular degeneration; POAG primary open angle glaucoma; EBMD epithelial/anterior basement membrane dystrophy; ACIOL anterior chamber intraocular lens; IOL intraocular lens; PCIOL posterior chamber intraocular lens; Phaco/IOL phacoemulsification with intraocular lens placement; [place] photorefractive keratectomy; LASIK laser assisted in situ  keratomileusis; HTN hypertension; DM diabetes mellitus; COPD chronic obstructive pulmonary disease

## 2021-09-27 ENCOUNTER — Encounter (INDEPENDENT_AMBULATORY_CARE_PROVIDER_SITE_OTHER): Payer: Medicare Other | Admitting: Ophthalmology

## 2021-09-27 ENCOUNTER — Encounter (INDEPENDENT_AMBULATORY_CARE_PROVIDER_SITE_OTHER): Payer: Self-pay

## 2021-09-27 DIAGNOSIS — Z961 Presence of intraocular lens: Secondary | ICD-10-CM

## 2021-09-27 DIAGNOSIS — I10 Essential (primary) hypertension: Secondary | ICD-10-CM

## 2021-09-27 DIAGNOSIS — H35033 Hypertensive retinopathy, bilateral: Secondary | ICD-10-CM

## 2021-09-27 DIAGNOSIS — H353131 Nonexudative age-related macular degeneration, bilateral, early dry stage: Secondary | ICD-10-CM

## 2021-09-27 DIAGNOSIS — Z9889 Other specified postprocedural states: Secondary | ICD-10-CM

## 2021-09-27 DIAGNOSIS — H3581 Retinal edema: Secondary | ICD-10-CM

## 2021-10-01 DIAGNOSIS — M9903 Segmental and somatic dysfunction of lumbar region: Secondary | ICD-10-CM | POA: Diagnosis not present

## 2021-10-01 DIAGNOSIS — M9904 Segmental and somatic dysfunction of sacral region: Secondary | ICD-10-CM | POA: Diagnosis not present

## 2021-10-01 DIAGNOSIS — M9901 Segmental and somatic dysfunction of cervical region: Secondary | ICD-10-CM | POA: Diagnosis not present

## 2021-10-01 DIAGNOSIS — M4723 Other spondylosis with radiculopathy, cervicothoracic region: Secondary | ICD-10-CM | POA: Diagnosis not present

## 2021-10-01 DIAGNOSIS — M4727 Other spondylosis with radiculopathy, lumbosacral region: Secondary | ICD-10-CM | POA: Diagnosis not present

## 2021-10-01 DIAGNOSIS — M5388 Other specified dorsopathies, sacral and sacrococcygeal region: Secondary | ICD-10-CM | POA: Diagnosis not present

## 2021-11-12 DIAGNOSIS — M9903 Segmental and somatic dysfunction of lumbar region: Secondary | ICD-10-CM | POA: Diagnosis not present

## 2021-11-12 DIAGNOSIS — M47813 Spondylosis without myelopathy or radiculopathy, cervicothoracic region: Secondary | ICD-10-CM | POA: Diagnosis not present

## 2021-11-12 DIAGNOSIS — M4727 Other spondylosis with radiculopathy, lumbosacral region: Secondary | ICD-10-CM | POA: Diagnosis not present

## 2021-11-12 DIAGNOSIS — M9904 Segmental and somatic dysfunction of sacral region: Secondary | ICD-10-CM | POA: Diagnosis not present

## 2021-11-12 DIAGNOSIS — M5388 Other specified dorsopathies, sacral and sacrococcygeal region: Secondary | ICD-10-CM | POA: Diagnosis not present

## 2021-11-12 DIAGNOSIS — M9902 Segmental and somatic dysfunction of thoracic region: Secondary | ICD-10-CM | POA: Diagnosis not present

## 2021-11-25 ENCOUNTER — Other Ambulatory Visit: Payer: Self-pay | Admitting: Cardiology

## 2021-11-25 NOTE — Telephone Encounter (Signed)
Prescription refill request for Eliquis received. ?Indication:Afib ?Last office visit:6/22 ?Scr:1.0 ?Age: 81 ?Weight:73.9 kg ? ?Prescription refilled ? ?

## 2021-12-11 DIAGNOSIS — M9904 Segmental and somatic dysfunction of sacral region: Secondary | ICD-10-CM | POA: Diagnosis not present

## 2021-12-11 DIAGNOSIS — M4727 Other spondylosis with radiculopathy, lumbosacral region: Secondary | ICD-10-CM | POA: Diagnosis not present

## 2021-12-11 DIAGNOSIS — M5388 Other specified dorsopathies, sacral and sacrococcygeal region: Secondary | ICD-10-CM | POA: Diagnosis not present

## 2021-12-11 DIAGNOSIS — M9903 Segmental and somatic dysfunction of lumbar region: Secondary | ICD-10-CM | POA: Diagnosis not present

## 2021-12-11 DIAGNOSIS — M9902 Segmental and somatic dysfunction of thoracic region: Secondary | ICD-10-CM | POA: Diagnosis not present

## 2021-12-11 DIAGNOSIS — M47813 Spondylosis without myelopathy or radiculopathy, cervicothoracic region: Secondary | ICD-10-CM | POA: Diagnosis not present

## 2021-12-24 DIAGNOSIS — R070 Pain in throat: Secondary | ICD-10-CM | POA: Diagnosis not present

## 2021-12-24 DIAGNOSIS — J020 Streptococcal pharyngitis: Secondary | ICD-10-CM | POA: Diagnosis not present

## 2021-12-26 ENCOUNTER — Other Ambulatory Visit: Payer: Self-pay | Admitting: Physician Assistant

## 2021-12-26 ENCOUNTER — Telehealth: Payer: Self-pay | Admitting: Physician Assistant

## 2021-12-26 DIAGNOSIS — J020 Streptococcal pharyngitis: Secondary | ICD-10-CM

## 2021-12-26 MED ORDER — AZITHROMYCIN 250 MG PO TABS: ORAL_TABLET | ORAL | 0 refills | Status: AC

## 2021-12-26 NOTE — Telephone Encounter (Signed)
Kelli [name] is agreeable to sending in a new antibiotic to [facility]. Patient has been made aware. AS, CMA ?

## 2021-12-26 NOTE — Telephone Encounter (Signed)
Patient was seen Tuesday at urgent care and tested positive for strep. They gave her keflex and she called and said she feels worse than she did before going. She is asking if we can change the antibiotics or if she needs to call UC. I suggested since she was seen at [facility] to call them and let them know what is going on and to see if they will change it however she did want your recommendations. Please advise. [phone] ?

## 2022-01-02 ENCOUNTER — Telehealth: Payer: Self-pay | Admitting: Physician Assistant

## 2022-01-02 NOTE — Telephone Encounter (Signed)
Patient tested positive for strep a week ago at [facility], they gave her an antibiotic but she called here and Herb Grays had changed it. She is still c/o not feeling well and is asking what she should do? Please call [phone] ?

## 2022-01-03 ENCOUNTER — Other Ambulatory Visit: Payer: Medicare Other

## 2022-01-03 DIAGNOSIS — J020 Streptococcal pharyngitis: Secondary | ICD-10-CM

## 2022-01-03 NOTE — Telephone Encounter (Signed)
Patient aware and scheduled.

## 2022-01-06 DIAGNOSIS — M47813 Spondylosis without myelopathy or radiculopathy, cervicothoracic region: Secondary | ICD-10-CM | POA: Diagnosis not present

## 2022-01-06 DIAGNOSIS — M9904 Segmental and somatic dysfunction of sacral region: Secondary | ICD-10-CM | POA: Diagnosis not present

## 2022-01-06 DIAGNOSIS — M9902 Segmental and somatic dysfunction of thoracic region: Secondary | ICD-10-CM | POA: Diagnosis not present

## 2022-01-06 DIAGNOSIS — M4727 Other spondylosis with radiculopathy, lumbosacral region: Secondary | ICD-10-CM | POA: Diagnosis not present

## 2022-01-06 DIAGNOSIS — M9903 Segmental and somatic dysfunction of lumbar region: Secondary | ICD-10-CM | POA: Diagnosis not present

## 2022-01-06 DIAGNOSIS — M5388 Other specified dorsopathies, sacral and sacrococcygeal region: Secondary | ICD-10-CM | POA: Diagnosis not present

## 2022-01-25 ENCOUNTER — Other Ambulatory Visit: Payer: Self-pay | Admitting: Cardiology

## 2022-01-27 ENCOUNTER — Other Ambulatory Visit: Payer: Self-pay

## 2022-01-27 MED ORDER — METOPROLOL TARTRATE 50 MG PO TABS: ORAL_TABLET | ORAL | 0 refills | Status: DC

## 2022-01-27 MED ORDER — METOPROLOL TARTRATE 50 MG PO TABS
ORAL_TABLET | ORAL | 0 refills | Status: DC
Start: 2022-01-27 — End: 2022-01-27

## 2022-01-27 NOTE — Telephone Encounter (Signed)
Called patient left message on personal voice mail Metoprolol refill sent to pharmacy.Advised to call back to schedule 1 year follow up with Dr.Jordan. ?

## 2022-02-05 DIAGNOSIS — M4727 Other spondylosis with radiculopathy, lumbosacral region: Secondary | ICD-10-CM | POA: Diagnosis not present

## 2022-02-05 DIAGNOSIS — M47894 Other spondylosis, thoracic region: Secondary | ICD-10-CM | POA: Diagnosis not present

## 2022-02-05 DIAGNOSIS — M9903 Segmental and somatic dysfunction of lumbar region: Secondary | ICD-10-CM | POA: Diagnosis not present

## 2022-02-05 DIAGNOSIS — M9902 Segmental and somatic dysfunction of thoracic region: Secondary | ICD-10-CM | POA: Diagnosis not present

## 2022-02-05 DIAGNOSIS — M5388 Other specified dorsopathies, sacral and sacrococcygeal region: Secondary | ICD-10-CM | POA: Diagnosis not present

## 2022-02-05 DIAGNOSIS — M9904 Segmental and somatic dysfunction of sacral region: Secondary | ICD-10-CM | POA: Diagnosis not present

## 2022-02-26 DIAGNOSIS — M9903 Segmental and somatic dysfunction of lumbar region: Secondary | ICD-10-CM | POA: Diagnosis not present

## 2022-02-26 DIAGNOSIS — M4727 Other spondylosis with radiculopathy, lumbosacral region: Secondary | ICD-10-CM | POA: Diagnosis not present

## 2022-02-26 DIAGNOSIS — M47894 Other spondylosis, thoracic region: Secondary | ICD-10-CM | POA: Diagnosis not present

## 2022-02-26 DIAGNOSIS — M9902 Segmental and somatic dysfunction of thoracic region: Secondary | ICD-10-CM | POA: Diagnosis not present

## 2022-02-26 DIAGNOSIS — M9904 Segmental and somatic dysfunction of sacral region: Secondary | ICD-10-CM | POA: Diagnosis not present

## 2022-02-26 DIAGNOSIS — M5388 Other specified dorsopathies, sacral and sacrococcygeal region: Secondary | ICD-10-CM | POA: Diagnosis not present

## 2022-03-27 DIAGNOSIS — M47817 Spondylosis without myelopathy or radiculopathy, lumbosacral region: Secondary | ICD-10-CM | POA: Diagnosis not present

## 2022-03-27 DIAGNOSIS — M9902 Segmental and somatic dysfunction of thoracic region: Secondary | ICD-10-CM | POA: Diagnosis not present

## 2022-03-27 DIAGNOSIS — M47894 Other spondylosis, thoracic region: Secondary | ICD-10-CM | POA: Diagnosis not present

## 2022-03-27 DIAGNOSIS — M9904 Segmental and somatic dysfunction of sacral region: Secondary | ICD-10-CM | POA: Diagnosis not present

## 2022-03-27 DIAGNOSIS — M5388 Other specified dorsopathies, sacral and sacrococcygeal region: Secondary | ICD-10-CM | POA: Diagnosis not present

## 2022-03-27 DIAGNOSIS — M9903 Segmental and somatic dysfunction of lumbar region: Secondary | ICD-10-CM | POA: Diagnosis not present

## 2022-04-26 ENCOUNTER — Other Ambulatory Visit: Payer: Self-pay | Admitting: Cardiology

## 2022-04-30 DIAGNOSIS — M5388 Other specified dorsopathies, sacral and sacrococcygeal region: Secondary | ICD-10-CM | POA: Diagnosis not present

## 2022-04-30 DIAGNOSIS — M9903 Segmental and somatic dysfunction of lumbar region: Secondary | ICD-10-CM | POA: Diagnosis not present

## 2022-04-30 DIAGNOSIS — M9902 Segmental and somatic dysfunction of thoracic region: Secondary | ICD-10-CM | POA: Diagnosis not present

## 2022-04-30 DIAGNOSIS — M47817 Spondylosis without myelopathy or radiculopathy, lumbosacral region: Secondary | ICD-10-CM | POA: Diagnosis not present

## 2022-04-30 DIAGNOSIS — M47894 Other spondylosis, thoracic region: Secondary | ICD-10-CM | POA: Diagnosis not present

## 2022-04-30 DIAGNOSIS — M9904 Segmental and somatic dysfunction of sacral region: Secondary | ICD-10-CM | POA: Diagnosis not present

## 2022-05-05 ENCOUNTER — Other Ambulatory Visit: Payer: Self-pay | Admitting: Physician Assistant

## 2022-05-22 NOTE — Progress Notes (Unsigned)
Kelli [name] Date of Birth: [birth year] Medical Record #[id number]  History of Present Illness: Ms. Kelli [name] is seen for follow up of atrial fibrillation.  She has a history of HTN, paroxysmal Afib with RVR. Admitted in 2014. Had demand ischemia. Myoview study was normal.  Echo from [place] of 2014 with EF of 55 to 60% and grade 1 diastolic dysfunction. She has been on chronic therapy with metoprolol and Eliquis. She was last seen in Dec 2021 by Almyra Deforest PA-C and was doing well.   On follow up today she is doing very well. 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 Denies any palpitations, dizziness, Chest pain. Rare SOB. Does note occasional loose stools.     Current Outpatient Medications  Medication Sig Dispense Refill   acetaminophen (TYLENOL) 500 MG tablet Take 500 mg by mouth daily as needed for pain.     apixaban (ELIQUIS) 5 MG TABS tablet TAKE 1 TABLET BY MOUTH 2 TIMES DAILY. 60 tablet 5   atorvastatin (LIPITOR) 10 MG tablet TAKE 1 TABLET BY MOUTH ONCE DAILY 90 tablet 0   Calcium Carb-Cholecalciferol (CALCIUM + D3) 600-200 MG-UNIT TABS Take 1 tablet by mouth daily.     Cholecalciferol (VITAMIN D) 400 UNITS capsule Take 400 Units by mouth daily.     Docusate Calcium (STOOL SOFTENER PO) Take by mouth at bedtime.     ELDERBERRY PO Take by mouth.     FIBER ADULT GUMMIES PO Take 1 Dose by mouth daily.     fish oil-omega-3 fatty acids 1000 MG capsule Take 1 g by mouth daily.     Flaxseed, Linseed, (FLAX SEED OIL PO) Take by mouth daily.     fluticasone (FLONASE) 50 MCG/ACT nasal spray Place 2 sprays into both nostrils daily. 16 g 6   hydrochlorothiazide (HYDRODIURIL) 12.5 MG tablet TAKE 1 TABLET (12.5 MG TOTAL) BY MOUTH DAILY. 90 tablet 0   losartan (COZAAR) 50 MG tablet Take 1 tablet (50 mg total) by mouth daily. 90 tablet 3   Magnesium 300 MG CAPS Take 1 capsule by mouth daily.     metoprolol tartrate (LOPRESSOR) 50 MG tablet TAKE 1 TABLET  BY MOUTH IN THE MORNING AND TAKE 1/2 TABLET IN THE AFTERNOON NEED APPOINTMENT FOR REFILLS 135 tablet 0   Probiotic Product (PROBIOTIC DAILY PO) Take 1 tablet by mouth daily as needed.      vitamin C (ASCORBIC ACID) 500 MG tablet Take 500 mg by mouth daily.     No current facility-administered medications for this visit.    Allergies  Allergen Reactions   Amoxicillin Rash   Augmentin [Amoxicillin-Pot Clavulanate] Itching and Rash    Past Medical History:  Diagnosis Date   Allergy    Phreesia 07/04/2020   Arthritis    Phreesia 07/04/2020   Atrial fibrillation ([place]) 02/2013   paroxsymal; c/b Type 2 NSTEMI; CHADS2-VASc=2 (age, sex - patient had come off anti-HTN meds in past); Eliquis started;  Echocardiogram 03/10/13: EF 36-64%, grade 1 diastolic dysfunction, mild MR, mild LAE   GERD (gastroesophageal reflux disease)    Phreesia 07/04/2020   Hyperlipidemia    Hypertension    Myocardial infarction [facility])    Phreesia 07/04/2020   NSTEMI (non-ST elevated myocardial infarction) ([place]) 02/2013   demand ischemia in setting of AFib with RVR; ETT-Myoview 03/11/13: No ischemia or scar, EF 70%.    Past Surgical History:  Procedure Laterality Date   APPENDECTOMY     CATARACT EXTRACTION  CATARACT EXTRACTION, BILATERAL     EYE SURGERY     STRABISMUS SURGERY     TONSILLECTOMY     YAG LASER APPLICATION      Social History   Tobacco Use  Smoking Status Never  Smokeless Tobacco Never    Social History   Substance and Sexual Activity  Alcohol Use Yes   Comment: 1-2 glasses of wine occ.    Family History  Problem Relation Age of Onset   Heart attack Mother 75   Cancer Maternal Aunt        throat    Review of Systems: The review of systems is per the HPI.  All other systems were reviewed and are negative.  Physical Exam: There were no vitals taken for this visit. GENERAL:  Well appearing WF in NAD HEENT:  PERRL, EOMI, sclera are clear. Oropharynx is clear. NECK:  No  jugular venous distention, carotid upstroke brisk and symmetric, no bruits, no thyromegaly or adenopathy LUNGS:  Clear to auscultation bilaterally CHEST:  Unremarkable HEART:  RRR,  PMI not displaced or sustained,S1 and S2 within normal limits, no S3, no S4: no clicks, no rubs, no murmurs ABD:  Soft, nontender. BS +, no masses or bruits. No hepatomegaly, no splenomegaly EXT:  2 + pulses throughout, no edema, no cyanosis no clubbing SKIN:  Warm and dry.  No rashes NEURO:  Alert and oriented x 3. Cranial nerves II through XII intact. PSYCH:  Cognitively intact      LABORATORY DATA:  Lab Results  Component Value Date   WBC 7.3 07/18/2021   HGB 13.7 07/18/2021   HCT 41.2 07/18/2021   PLT 284 07/18/2021   GLUCOSE 86 07/18/2021   CHOL 177 07/18/2021   TRIG 63 07/18/2021   HDL 55 07/18/2021   LDLCALC 110 (H) 07/18/2021   ALT 20 07/18/2021   AST 29 07/18/2021   NA 137 07/18/2021   K 4.9 07/18/2021   CL 100 07/18/2021   CREATININE 1.08 (H) 07/18/2021   BUN 22 07/18/2021   CO2 25 07/18/2021   TSH 1.360 07/18/2021   INR 1.19 01/03/2014   HGBA1C 6.1 (H) 07/18/2021   Labs dated 12/09/16: cholesterol 180, triglycerides 93, HDL 48, LDL 113. CMET normal.   Assessment / Plan: 1.  PAF - Maintaining NSR. Continue metoprolol and Eliquis.   2.  Chronic anticoagulation- tolerating well.  3. HTN - BP is well controlled.  4. HLD. Last LDL 107 on 10 mg lipitor.    I will follow up in one year

## 2022-05-26 ENCOUNTER — Encounter: Payer: Self-pay | Admitting: Cardiology

## 2022-05-26 ENCOUNTER — Ambulatory Visit: Payer: Medicare Other | Attending: Cardiology | Admitting: Cardiology

## 2022-05-26 VITALS — BP 126/72 | HR 64 | Ht 63.0 in | Wt 166.8 lb

## 2022-05-26 DIAGNOSIS — E7800 Pure hypercholesterolemia, unspecified: Secondary | ICD-10-CM | POA: Diagnosis not present

## 2022-05-26 DIAGNOSIS — I10 Essential (primary) hypertension: Secondary | ICD-10-CM

## 2022-05-26 DIAGNOSIS — I480 Paroxysmal atrial fibrillation: Secondary | ICD-10-CM

## 2022-05-26 MED ORDER — HYDROCHLOROTHIAZIDE 12.5 MG PO TABS: 12.5000 mg | ORAL_TABLET | Freq: Every day | ORAL | 3 refills | Status: DC

## 2022-05-26 MED ORDER — ATORVASTATIN CALCIUM 10 MG PO TABS: 10.0000 mg | ORAL_TABLET | Freq: Every day | ORAL | 3 refills | Status: DC

## 2022-05-26 MED ORDER — METOPROLOL TARTRATE 50 MG PO TABS: ORAL_TABLET | ORAL | 3 refills | Status: DC

## 2022-05-26 MED ORDER — LOSARTAN POTASSIUM 50 MG PO TABS: 50.0000 mg | ORAL_TABLET | Freq: Every day | ORAL | 3 refills | Status: DC

## 2022-05-26 MED ORDER — APIXABAN 5 MG PO TABS: 5.0000 mg | ORAL_TABLET | Freq: Two times a day (BID) | ORAL | 11 refills | Status: DC

## 2022-05-27 ENCOUNTER — Encounter: Payer: Self-pay | Admitting: Nurse Practitioner

## 2022-05-27 ENCOUNTER — Ambulatory Visit (INDEPENDENT_AMBULATORY_CARE_PROVIDER_SITE_OTHER): Payer: Medicare Other | Admitting: Nurse Practitioner

## 2022-05-27 VITALS — Ht 63.0 in | Wt 166.0 lb

## 2022-05-27 DIAGNOSIS — J0140 Acute pansinusitis, unspecified: Secondary | ICD-10-CM | POA: Diagnosis not present

## 2022-05-27 MED ORDER — AZITHROMYCIN 250 MG PO TABS: ORAL_TABLET | ORAL | 0 refills | Status: DC

## 2022-05-27 NOTE — Progress Notes (Signed)
Virtual Visit via Telephone Note  I connected with Kelli [name] on 05/27/22 at  1:30 PM EDT by telephone and verified that I am speaking with the correct person using two identifiers.  Location: Patient: home  Provider: [place] [facility]     I discussed the limitations, risks, security and privacy concerns of performing an evaluation and management service by telephone and the availability of in person appointments. I also discussed with the patient that there may be a patient responsible charge related to this service. The patient expressed understanding and agreed to proceed.   History of Present Illness: -two weeks --allergy type symptoms.  --cough,  --headache with sinus pressure --did take COVID test this weekend which was negative.  --she states she may have had a low grade fever.  --has taken some tylenol for headache and flonase and saline for nasal congestion.     Observations/Objective:  The patient is alert and oriented. She is pleasant and answers all questions appropriately. Breathing is non-labored. She is in no acute distress at this time.  She is nasally congested.   Today's Vitals   05/27/22 1313  Weight: 166 lb (75.3 kg)  Height: '5\' 3"'$  (1.6 m)   Body mass index is 29.41 kg/m.   Assessment and Plan: 1. Acute non-recurrent pansinusitis Start z-pack. Take as directed for 5 days. Rest and increase fluids. Continue using OTC medication to control symptoms.   - azithromycin (ZITHROMAX) 250 MG tablet; z-pack - take as directed for 5 days  Dispense: 6 tablet; Refill: 0   Follow Up Instructions:    I discussed the assessment and treatment plan with the patient. The patient was provided an opportunity to ask questions and all were answered. The patient agreed with the plan and demonstrated an understanding of the instructions.   The patient was advised to call back or seek an in-person evaluation if the symptoms worsen or if the condition  fails to improve as anticipated.  I provided 10 minutes of non-face-to-face time during this encounter.   Ronnell Freshwater, NP

## 2022-06-10 DIAGNOSIS — M9902 Segmental and somatic dysfunction of thoracic region: Secondary | ICD-10-CM | POA: Diagnosis not present

## 2022-06-10 DIAGNOSIS — M9904 Segmental and somatic dysfunction of sacral region: Secondary | ICD-10-CM | POA: Diagnosis not present

## 2022-06-10 DIAGNOSIS — M47817 Spondylosis without myelopathy or radiculopathy, lumbosacral region: Secondary | ICD-10-CM | POA: Diagnosis not present

## 2022-06-10 DIAGNOSIS — M5388 Other specified dorsopathies, sacral and sacrococcygeal region: Secondary | ICD-10-CM | POA: Diagnosis not present

## 2022-06-10 DIAGNOSIS — M9903 Segmental and somatic dysfunction of lumbar region: Secondary | ICD-10-CM | POA: Diagnosis not present

## 2022-06-10 DIAGNOSIS — M47895 Other spondylosis, thoracolumbar region: Secondary | ICD-10-CM | POA: Diagnosis not present

## 2022-07-10 DIAGNOSIS — M9904 Segmental and somatic dysfunction of sacral region: Secondary | ICD-10-CM | POA: Diagnosis not present

## 2022-07-10 DIAGNOSIS — M9903 Segmental and somatic dysfunction of lumbar region: Secondary | ICD-10-CM | POA: Diagnosis not present

## 2022-07-10 DIAGNOSIS — M5388 Other specified dorsopathies, sacral and sacrococcygeal region: Secondary | ICD-10-CM | POA: Diagnosis not present

## 2022-07-10 DIAGNOSIS — M47817 Spondylosis without myelopathy or radiculopathy, lumbosacral region: Secondary | ICD-10-CM | POA: Diagnosis not present

## 2022-07-10 DIAGNOSIS — M47895 Other spondylosis, thoracolumbar region: Secondary | ICD-10-CM | POA: Diagnosis not present

## 2022-07-10 DIAGNOSIS — M9902 Segmental and somatic dysfunction of thoracic region: Secondary | ICD-10-CM | POA: Diagnosis not present

## 2022-07-18 ENCOUNTER — Other Ambulatory Visit: Payer: Self-pay | Admitting: Cardiology

## 2022-07-21 ENCOUNTER — Telehealth: Payer: Self-pay | Admitting: *Deleted

## 2022-07-21 NOTE — Telephone Encounter (Signed)
Monica from [facility] called to report patients Quantaflow test results for PAD.   Right foot-0.69-mild Left foot-0.57-moderate   Brayton Layman said it usually takes 2-4 weeks for the provider to receive the results and said that if you need them sooner we can contact her back and she can fax them.  Her number is [phone]. Please advise. Jahi Roza Zimmerman Rumple, CMA

## 2022-07-23 NOTE — Telephone Encounter (Signed)
That is exactly what I was going to say. She has wellness visit. Will refer her to vein and vascular.

## 2022-07-29 ENCOUNTER — Encounter: Payer: Self-pay | Admitting: Nurse Practitioner

## 2022-07-29 ENCOUNTER — Other Ambulatory Visit: Payer: Medicare Other

## 2022-07-29 ENCOUNTER — Ambulatory Visit (INDEPENDENT_AMBULATORY_CARE_PROVIDER_SITE_OTHER): Payer: Medicare Other | Admitting: Nurse Practitioner

## 2022-07-29 VITALS — BP 136/80 | HR 56 | Ht 63.0 in | Wt 169.0 lb

## 2022-07-29 DIAGNOSIS — Z0000 Encounter for general adult medical examination without abnormal findings: Secondary | ICD-10-CM

## 2022-07-29 DIAGNOSIS — M064 Inflammatory polyarthropathy: Secondary | ICD-10-CM

## 2022-07-29 DIAGNOSIS — Z0001 Encounter for general adult medical examination with abnormal findings: Secondary | ICD-10-CM | POA: Diagnosis not present

## 2022-07-29 DIAGNOSIS — Z23 Encounter for immunization: Secondary | ICD-10-CM | POA: Diagnosis not present

## 2022-07-29 DIAGNOSIS — R7303 Prediabetes: Secondary | ICD-10-CM | POA: Diagnosis not present

## 2022-07-29 DIAGNOSIS — E782 Mixed hyperlipidemia: Secondary | ICD-10-CM

## 2022-07-29 DIAGNOSIS — E559 Vitamin D deficiency, unspecified: Secondary | ICD-10-CM | POA: Diagnosis not present

## 2022-07-29 DIAGNOSIS — I10 Essential (primary) hypertension: Secondary | ICD-10-CM | POA: Diagnosis not present

## 2022-07-29 MED ORDER — METHYLPREDNISOLONE 4 MG PO TBPK: ORAL_TABLET | ORAL | 0 refills | Status: DC

## 2022-07-29 NOTE — Progress Notes (Signed)
Complete physical exam   Patient: Kelli [name]   DOB: 08/13/1941   81 y.o. Female  MRN: [id number] Visit Date: 07/29/2022  Annual physical   Subjective    Kelli [name] is a 81 y.o. female who presents today for a complete physical exam.  She reports consuming a  generally healthy  diet.  She generally feels well. She does have additional problems to discuss today  HPI  Annual physical  -bump on the skin of right upper back.  --tender, bleeding off and on  -arthritis  -pain and swelling in the fingers and hand.  -routine, fasting labs drawn earlier today  -will need to schedule appointment for medicare wellness visit before the end of the year   Past Medical History:  Diagnosis Date   Allergy    Phreesia 07/04/2020   Arthritis    Phreesia 07/04/2020   Atrial fibrillation ([place]) 02/2013   paroxsymal; c/b Type 2 NSTEMI; CHADS2-VASc=2 (age, sex - patient had come off anti-HTN meds in past); Eliquis started;  Echocardiogram 03/10/13: EF 66-59%, grade 1 diastolic dysfunction, mild MR, mild LAE   GERD (gastroesophageal reflux disease)    Phreesia 07/04/2020   Hyperlipidemia    Hypertension    Myocardial infarction [facility])    Phreesia 07/04/2020   NSTEMI (non-ST elevated myocardial infarction) ([place]) 02/2013   demand ischemia in setting of AFib with RVR; ETT-Myoview 03/11/13: No ischemia or scar, EF 70%.   Past Surgical History:  Procedure Laterality Date   APPENDECTOMY     CATARACT EXTRACTION     CATARACT EXTRACTION, BILATERAL     EYE SURGERY     STRABISMUS SURGERY     TONSILLECTOMY     YAG LASER APPLICATION     Social History   Socioeconomic History   Marital status: Widowed    Spouse name: Not on file   Number of children: 3   Years of education: Not on file   Highest education level: Not on file  Occupational History   Occupation: Surveyor, quantity- Day care    Employer: [employer]  Tobacco Use   Smoking status: Never   Smokeless tobacco: Never   Vaping Use   Vaping Use: Never used  Substance and Sexual Activity   Alcohol use: Yes    Comment: 1-2 glasses of wine occ.   Drug use: No   Sexual activity: Not Currently  Other Topics Concern   Not on file  Social History Narrative   Not on file   Social Determinants of Health   Financial Resource Strain: Not on file  Food Insecurity: Not on file  Transportation Needs: Not on file  Physical Activity: Not on file  Stress: Not on file  Social Connections: Not on file  Intimate Partner Violence: Not on file   Family Status  Relation Name Status   Mother  Deceased   Father  Deceased   Mat Aunt  (Not Specified)   MGM  Deceased   MGF  Deceased   PGM  Deceased   PGF  Deceased   Family History  Problem Relation Age of Onset   Heart attack Mother 69   Cancer Maternal Aunt        throat   Allergies  Allergen Reactions   Amoxicillin Rash   Augmentin [Amoxicillin-Pot Clavulanate] Itching and Rash    Patient Care Team: Lorrene Reid, PA-C as PCP - General Martinique, Peter M, MD as PCP - Cardiology (Cardiology) Martinique, Peter M, MD as Consulting Physician (Cardiology)  Allyn Kenner, MD as Consulting Physician (Dermatology) Wonda Horner, MD as Consulting Physician (Gastroenterology) Care, [facility]gent   Medications: Outpatient Medications Prior to Visit  Medication Sig   acetaminophen (TYLENOL) 500 MG tablet Take 500 mg by mouth daily as needed for pain.   apixaban (ELIQUIS) 5 MG TABS tablet Take 1 tablet (5 mg total) by mouth 2 (two) times daily.   atorvastatin (LIPITOR) 10 MG tablet Take 1 tablet (10 mg total) by mouth daily.   azithromycin (ZITHROMAX) 250 MG tablet z-pack - take as directed for 5 days   Calcium Carb-Cholecalciferol (CALCIUM + D3) 600-200 MG-UNIT TABS Take 1 tablet by mouth daily.   Cholecalciferol (VITAMIN D) 400 UNITS capsule Take 400 Units by mouth daily.   Docusate Calcium (STOOL SOFTENER PO) Take by mouth at bedtime.   ELDERBERRY PO Take by  mouth.   FIBER ADULT GUMMIES PO Take 1 Dose by mouth daily.   fish oil-omega-3 fatty acids 1000 MG capsule Take 1 g by mouth daily.   Flaxseed, Linseed, (FLAX SEED OIL PO) Take by mouth daily.   fluticasone (FLONASE) 50 MCG/ACT nasal spray Place 2 sprays into both nostrils daily.   hydrochlorothiazide (HYDRODIURIL) 12.5 MG tablet Take 1 tablet (12.5 mg total) by mouth daily.   losartan (COZAAR) 50 MG tablet Take 1 tablet (50 mg total) by mouth daily.   Magnesium 300 MG CAPS Take 1 capsule by mouth daily.   metoprolol tartrate (LOPRESSOR) 50 MG tablet TAKE 1 TABLET BY MOUTH IN THE MORNING AND 1/2 TABLET IN THE AFTERNOON. NEED APPOINTMENT FOR REFILLS   Probiotic Product (PROBIOTIC DAILY PO) Take 1 tablet by mouth daily as needed.    vitamin C (ASCORBIC ACID) 500 MG tablet Take 500 mg by mouth daily.   No facility-administered medications prior to visit.    Review of Systems  Constitutional:  Negative for activity change, appetite change, chills, fatigue and fever.  HENT:  Negative for congestion, postnasal drip, rhinorrhea, sinus pressure, sinus pain, sneezing and sore throat.   Eyes: Negative.   Respiratory:  Negative for cough, chest tightness, shortness of breath and wheezing.   Cardiovascular:  Negative for chest pain and palpitations.  Gastrointestinal:  Negative for abdominal pain, constipation, diarrhea, nausea and vomiting.  Endocrine: Negative for cold intolerance, heat intolerance, polydipsia and polyuria.  Genitourinary:  Negative for dyspareunia, dysuria, flank pain, frequency and urgency.  Musculoskeletal:  Positive for arthralgias. Negative for back pain and myalgias.  Skin:  Negative for rash.       Small. Raised lesion on upper back.   Allergic/Immunologic: Negative for environmental allergies.  Neurological:  Negative for dizziness, weakness and headaches.  Hematological:  Negative for adenopathy.  Psychiatric/Behavioral:  The patient is not nervous/anxious.         Objective     Today's Vitals   07/29/22 1030  BP: 136/80  Pulse: (Abnormal) 56  SpO2: 97%  Weight: 169 lb (76.7 kg)  Height: _0  (1.6 m)   Body mass index is 29.94 kg/m.  BP Readings from Last 3 Encounters:  07/29/22 136/80  05/26/22 126/72  07/18/21 120/65    Wt Readings from Last 3 Encounters:  07/29/22 169 lb (76.7 kg)  05/27/22 166 lb (75.3 kg)  05/26/22 166 lb 12.8 oz (75.7 kg)     Physical Exam Vitals and nursing note reviewed.  Constitutional:      Appearance: Normal appearance. She is well-developed.  HENT:     Head: Normocephalic and atraumatic.  Right Ear: Tympanic membrane, ear canal and external ear normal.     Left Ear: Tympanic membrane, ear canal and external ear normal.     Nose: Nose normal.     Mouth/Throat:     Mouth: Mucous membranes are moist.     Pharynx: Oropharynx is clear.  Eyes:     Extraocular Movements: Extraocular movements intact.     Conjunctiva/sclera: Conjunctivae normal.     Pupils: Pupils are equal, round, and reactive to light.  Cardiovascular:     Rate and Rhythm: Normal rate and regular rhythm.     Pulses: Normal pulses.     Heart sounds: Normal heart sounds.  Pulmonary:     Effort: Pulmonary effort is normal.     Breath sounds: Normal breath sounds.  Abdominal:     General: Bowel sounds are normal. There is no distension.     Palpations: Abdomen is soft. There is no mass.     Tenderness: There is no abdominal tenderness. There is no right CVA tenderness, left CVA tenderness, guarding or rebound.     Hernia: No hernia is present.  Musculoskeletal:        General: Normal range of motion.     Cervical back: Normal range of motion and neck supple.  Lymphadenopathy:     Cervical: No cervical adenopathy.  Skin:    General: Skin is warm and dry.     Capillary Refill: Capillary refill takes less than 2 seconds.     Comments: Small, circular lesion in the  upper mid back. Measures about 4 mm in diameter. Smooth borders.  Non tender. Skin intact  with no drainage noted   Neurological:     General: No focal deficit present.     Mental Status: She is alert and oriented to person, place, and time.  Psychiatric:        Mood and Affect: Mood normal.        Behavior: Behavior normal.        Thought Content: Thought content normal.        Judgment: Judgment normal.      Last depression screening scores   Row Labels 07/29/2022   11:09 AM 05/27/2022    1:14 PM 09/05/2021   11:32 AM  PHQ 2/9 Scores   Section Header. No data exists in this row.     PHQ - 2 Score   0 0 0  PHQ- 9 Score   1 0 1   Last fall risk screening   Row Labels 09/05/2021   11:34 AM  Fall Risk    Section Header. No data exists in this row.   Falls in the past year?   0  Number falls in past yr:   0  Injury with Fall?   0  Follow up   Falls evaluation completed    Results for orders placed or performed in visit on 07/29/22  Vitamin D (25 hydroxy)  Result Value Ref Range   Vit D, 25-Hydroxy 64.2 30.0 - 100.0 ng/mL  Lipid panel  Result Value Ref Range   Cholesterol, Total 168 100 - 199 mg/dL   Triglycerides 91 0 - 149 mg/dL   HDL 51 >39 mg/dL   VLDL Cholesterol Cal 17 5 - 40 mg/dL   LDL Chol Calc (NIH) 100 (H) 0 - 99 mg/dL   Chol/HDL Ratio 3.3 0.0 - 4.4 ratio  Comp Met (CMET)  Result Value Ref Range   Glucose 87 70 - 99 mg/dL  BUN 22 8 - 27 mg/dL   Creatinine, Ser 1.16 (H) 0.57 - 1.00 mg/dL   eGFR 47 (L) >59 mL/min/1.73   BUN/Creatinine Ratio 19 12 - 28   Sodium 138 134 - 144 mmol/L   Potassium 4.5 3.5 - 5.2 mmol/L   Chloride 98 96 - 106 mmol/L   CO2 23 20 - 29 mmol/L   Calcium 9.3 8.7 - 10.3 mg/dL   Total Protein 6.9 6.0 - 8.5 g/dL   Albumin 4.1 3.7 - 4.7 g/dL   Globulin, Total 2.8 1.5 - 4.5 g/dL   Albumin/Globulin Ratio 1.5 1.2 - 2.2   Bilirubin Total 0.7 0.0 - 1.2 mg/dL   Alkaline Phosphatase 67 44 - 121 IU/L   AST 35 0 - 40 IU/L   ALT 25 0 - 32 IU/L  CBC  Result Value Ref Range   WBC 8.0 3.4 - 10.8 x10E3/uL    RBC 4.26 3.77 - 5.28 x10E6/uL   Hemoglobin 13.2 11.1 - 15.9 g/dL   Hematocrit 38.5 34.0 - 46.6 %   MCV 90 79 - 97 fL   MCH 31.0 26.6 - 33.0 pg   MCHC 34.3 31.5 - 35.7 g/dL   RDW 12.6 11.7 - 15.4 %   Platelets 238 150 - 450 x10E3/uL  Hemoglobin A1c  Result Value Ref Range   Hgb A1c MFr Bld 5.8 (H) 4.8 - 5.6 %   Est. average glucose Bld gHb Est-mCnc 120 mg/dL  TSH  Result Value Ref Range   TSH 2.140 0.450 - 4.500 uIU/mL    Assessment & Plan    1. Encounter for general adult medical examination with abnormal findings Annual physical today   2. Essential hypertension Stable. Continue bp medication as prescribed   3. Mixed hyperlipidemia Check fasting lipids today and adjust atorvastatin as indicated   4. Inflammatory polyarthritis (HCC) Trial medrol taper. Take as directed for 6 days. Take tylenol as needed to  help pain management.  - methylPREDNISolone (MEDROL) 4 MG TBPK tablet; Take by mouth as directed for 6 days  Dispense: 21 tablet; Refill: 0  5. Need for influenza vaccination Flu vaccine administered during today's visit.   - Flu Vaccine QUAD 6+ mos PF IM (Fluarix Quad PF)  Immunization History  Administered Date(s) Administered   Fluad Quad(high Dose 65+) 08/16/2019   Influenza, High Dose Seasonal PF 08/09/2018   Influenza,inj,Quad PF,6+ Mos 07/18/2021, 07/29/2022   Moderna Sars-Covid-2 Vaccination 10/22/2019, 11/08/2019   Pneumococcal Polysaccharide-23 06/12/2009   Tdap 07/16/2011    Health Maintenance  Topic Date Due   Zoster Vaccines- Shingrix (1 of 2) Never done   Pneumonia Vaccine 67+ Years old (2 - PCV) 06/12/2010   COVID-19 Vaccine (3 - Moderna risk series) 12/06/2019   DTaP/Tdap/Td (2 - Td or Tdap) 07/15/2021   Medicare Annual Wellness (AWV)  07/18/2022   INFLUENZA VACCINE  Completed   DEXA SCAN  Completed   HPV VACCINES  Aged Out    Discussed health benefits of physical activity, and encouraged her to engage in regular exercise appropriate for  her age and condition.  Problem List Items Addressed This Visit       Cardiovascular and Mediastinum   Essential hypertension     Other   Mixed hyperlipidemia   Other Visit Diagnoses     Encounter for general adult medical examination with abnormal findings    -  Primary   Inflammatory polyarthritis ([place])       Relevant Medications   methylPREDNISolone (MEDROL) 4 MG TBPK  tablet   Need for influenza vaccination       Relevant Orders   Flu Vaccine QUAD 6+ mos PF IM (Fluarix Quad PF) (Completed)        Return in about 6 weeks (around 09/09/2022) for medicare wellness - reschedule from today .        Ronnell Freshwater, NP  [facility] at [facility] [phone] (phone) [phone] (fax)  [place]

## 2022-07-29 NOTE — Progress Notes (Deleted)
Subjective:   Kelli [name] is a 81 y.o. female who presents for Medicare Annual (Subsequent) preventive examination.  Review of Systems    Referred to PCP       Objective:    There were no vitals filed for this visit. There is no height or weight on file to calculate BMI.     06/15/2019    8:13 AM 12/24/2013    3:00 AM 03/09/2013    8:10 PM  Advanced Directives  Does Patient Have a Medical Advance Directive? Yes Patient has advance directive, copy not in chart Patient has advance directive, copy not in chart  Type of Advance Directive  Living will Living will;Healthcare Power of Attorney  Does patient want to make changes to medical advance directive?  No change requested   Copy of [place] in Chart?  Copy requested from family Copy requested from family  Pre-existing out of facility DNR order (yellow form or pink MOST form)  No     Current Medications (verified) Outpatient Encounter Medications as of 07/29/2022  Medication Sig   acetaminophen (TYLENOL) 500 MG tablet Take 500 mg by mouth daily as needed for pain.   apixaban (ELIQUIS) 5 MG TABS tablet Take 1 tablet (5 mg total) by mouth 2 (two) times daily.   atorvastatin (LIPITOR) 10 MG tablet Take 1 tablet (10 mg total) by mouth daily.   azithromycin (ZITHROMAX) 250 MG tablet z-pack - take as directed for 5 days   Calcium Carb-Cholecalciferol (CALCIUM + D3) 600-200 MG-UNIT TABS Take 1 tablet by mouth daily.   Cholecalciferol (VITAMIN D) 400 UNITS capsule Take 400 Units by mouth daily.   Docusate Calcium (STOOL SOFTENER PO) Take by mouth at bedtime.   ELDERBERRY PO Take by mouth.   FIBER ADULT GUMMIES PO Take 1 Dose by mouth daily.   fish oil-omega-3 fatty acids 1000 MG capsule Take 1 g by mouth daily.   Flaxseed, Linseed, (FLAX SEED OIL PO) Take by mouth daily.   fluticasone (FLONASE) 50 MCG/ACT nasal spray Place 2 sprays into both nostrils daily.   hydrochlorothiazide (HYDRODIURIL) 12.5 MG tablet Take  1 tablet (12.5 mg total) by mouth daily.   losartan (COZAAR) 50 MG tablet Take 1 tablet (50 mg total) by mouth daily.   Magnesium 300 MG CAPS Take 1 capsule by mouth daily.   metoprolol tartrate (LOPRESSOR) 50 MG tablet TAKE 1 TABLET BY MOUTH IN THE MORNING AND 1/2 TABLET IN THE AFTERNOON. NEED APPOINTMENT FOR REFILLS   Probiotic Product (PROBIOTIC DAILY PO) Take 1 tablet by mouth daily as needed.    vitamin C (ASCORBIC ACID) 500 MG tablet Take 500 mg by mouth daily.   No facility-administered encounter medications on file as of 07/29/2022.    Allergies (verified) Amoxicillin and Augmentin [amoxicillin-pot clavulanate]   History: Past Medical History:  Diagnosis Date   Allergy    Phreesia 07/04/2020   Arthritis    Phreesia 07/04/2020   Atrial fibrillation ([place]) 02/2013   paroxsymal; c/b Type 2 NSTEMI; CHADS2-VASc=2 (age, sex - patient had come off anti-HTN meds in past); Eliquis started;  Echocardiogram 03/10/13: EF 18-84%, grade 1 diastolic dysfunction, mild MR, mild LAE   GERD (gastroesophageal reflux disease)    Phreesia 07/04/2020   Hyperlipidemia    Hypertension    Myocardial infarction [facility])    Phreesia 07/04/2020   NSTEMI (non-ST elevated myocardial infarction) ([place]) 02/2013   demand ischemia in setting of AFib with RVR; ETT-Myoview 03/11/13: No ischemia or scar,  EF 70%.   Past Surgical History:  Procedure Laterality Date   APPENDECTOMY     CATARACT EXTRACTION     CATARACT EXTRACTION, BILATERAL     EYE SURGERY     STRABISMUS SURGERY     TONSILLECTOMY     YAG LASER APPLICATION     Family History  Problem Relation Age of Onset   Heart attack Mother 67   Cancer Maternal Aunt        throat   Social History   Socioeconomic History   Marital status: Widowed    Spouse name: Not on file   Number of children: 3   Years of education: Not on file   Highest education level: Not on file  Occupational History   Occupation: Surveyor, quantity- Day care    Employer:  [employer]  Tobacco Use   Smoking status: Never   Smokeless tobacco: Never  Vaping Use   Vaping Use: Never used  Substance and Sexual Activity   Alcohol use: Yes    Comment: 1-2 glasses of wine occ.   Drug use: No   Sexual activity: Not Currently  Other Topics Concern   Not on file  Social History Narrative   Not on file   Social Determinants of Health   Financial Resource Strain: Not on file  Food Insecurity: Not on file  Transportation Needs: Not on file  Physical Activity: Not on file  Stress: Not on file  Social Connections: Not on file    Tobacco Counseling Counseling given: Not Answered   Clinical Intake:                 Diabetic?***         Activities of Daily Living    05/27/2022    1:16 PM 09/05/2021   11:35 AM  In your present state of health, do you have any difficulty performing the following activities:  Hearing? 0 0  Vision? 0 0  Difficulty concentrating or making decisions? 0 0  Walking or climbing stairs? 0 0  Dressing or bathing? 0 0  Doing errands, shopping? 0 0    Patient Care Team: Lorrene Reid, PA-C as PCP - General Martinique, Peter M, MD as PCP - Cardiology (Cardiology) [place], Peter M, MD as Consulting Physician (Cardiology) Allyn Kenner, MD (Inactive) as Consulting Physician (Dermatology) Wonda Horner, MD as Consulting Physician (Gastroenterology) Care, [facility]  Indicate any recent Medical Services you may have received from other than [facility] providers in the past year (date may be approximate).     Assessment:   This is a routine wellness examination for Kelli [name].  Hearing/Vision screen No results found.  Dietary issues and exercise activities discussed:     Goals Addressed   None   Depression Screen    05/27/2022    1:14 PM 09/05/2021   11:32 AM 07/18/2021    9:03 AM 07/05/2020    1:13 PM 02/09/2020   10:05 AM 09/21/2019    8:20 AM 06/15/2019    8:13 AM  PHQ 2/9 Scores  PHQ - 2 Score  0 0 0 0 0 0 0  PHQ- 9 Score 0 1 0 1 0 1 2    Fall Risk    09/05/2021   11:34 AM 07/18/2021    9:04 AM 07/05/2020    1:12 PM 06/15/2019    8:13 AM 08/09/2018    9:00 AM  Fall Risk   Falls in the past year? 0 0 0 1 0  Number falls in past yr: 0 0  0   Injury with Fall? 0 0  1   Risk for fall due to :   No Fall Risks    Follow up Falls evaluation completed Falls evaluation completed Falls evaluation completed      [place]:  Any stairs in or around the home? {YES/NO:21197} If so, are there any without handrails? {YES/NO:21197} Home free of loose throw rugs in walkways, pet beds, electrical cords, etc? {YES/NO:21197} Adequate lighting in your home to reduce risk of falls? {YES/NO:21197}  ASSISTIVE DEVICES UTILIZED TO PREVENT FALLS:  Life alert? {YES/NO:21197} Use of a cane, walker or w/c? {YES/NO:21197} Grab bars in the bathroom? {YES/NO:21197} Shower chair or bench in shower? {YES/NO:21197} Elevated toilet seat or a handicapped toilet? {YES/NO:21197}  TIMED UP AND GO:  Was the test performed? {YES/NO:21197}.  Length of time to ambulate 10 feet: *** sec.   {Appearance of EGBT:5176160}  Cognitive Function:        07/05/2020    1:16 PM 06/15/2019    8:14 AM 12/22/2017    8:30 AM  6CIT Screen  What Year? 0 points 0 points 0 points  What month? 0 points 0 points 0 points  What time? 0 points 0 points 0 points  Count back from 20 0 points 0 points 0 points  Months in reverse 0 points 0 points 0 points  Repeat phrase 0 points 0 points 0 points  Total Score 0 points 0 points 0 points    Immunizations Immunization History  Administered Date(s) Administered   Fluad Quad(high Dose 65+) 08/16/2019   Influenza, High Dose Seasonal PF 08/09/2018   Influenza,inj,Quad PF,6+ Mos 07/18/2021   Moderna Sars-Covid-2 Vaccination 10/22/2019, 11/08/2019   Pneumococcal Polysaccharide-23 06/12/2009   Tdap 07/16/2011    {TDAP  status:2101805}  {Flu Vaccine status:2101806}  {Pneumococcal vaccine status:2101807}  {Covid-19 vaccine status:2101808}  Qualifies for Shingles Vaccine? {YES/NO:21197}  Zostavax completed {YES/NO:21197}  {Shingrix Completed?:2101804}  Screening Tests Health Maintenance  Topic Date Due   Zoster Vaccines- Shingrix (1 of 2) Never done   Pneumonia Vaccine 18+ Years old (2 - PCV) 06/12/2010   COVID-19 Vaccine (3 - Moderna risk series) 12/06/2019   TETANUS/TDAP  07/15/2021   INFLUENZA VACCINE  04/15/2022   Medicare Annual Wellness (AWV)  07/18/2022   DEXA SCAN  Completed   HPV VACCINES  Aged Out    Health Maintenance  Health Maintenance Due  Topic Date Due   Zoster Vaccines- Shingrix (1 of 2) Never done   Pneumonia Vaccine 49+ Years old (2 - PCV) 06/12/2010   COVID-19 Vaccine (3 - Moderna risk series) 12/06/2019   TETANUS/TDAP  07/15/2021   INFLUENZA VACCINE  04/15/2022   Medicare Annual Wellness (AWV)  07/18/2022    {Colorectal cancer screening:2101809}  {Mammogram status:21018020}  {Bone Density status:21018021}  Lung Cancer Screening: (Low Dose CT Chest recommended if Age 10-80 years, 30 pack-year currently smoking OR have quit w/in 15years.) {DOES NOT does:27190::"does not"} qualify.   Lung Cancer Screening Referral: ***  Additional Screening:  Hepatitis C Screening: {DOES NOT does:27190::"does not"} qualify; Completed ***  Vision Screening: Recommended annual ophthalmology exams for early detection of glaucoma and other disorders of the eye. Is the patient up to date with their annual eye exam?  {YES/NO:21197} Who is the provider or what is the name of the office in which the patient attends annual eye exams? *** If pt is not established with a provider, would they like to  be referred to a provider to establish care? {YES/NO:21197}.   Dental Screening: Recommended annual dental exams for proper oral hygiene  Community Resource Referral / Chronic Care  Management: CRR required this visit?  {YES/NO:21197}  CCM required this visit?  {YES/NO:21197}     Plan:     I have personally reviewed and noted the following in the patient's chart:   Medical and social history Use of alcohol, tobacco or illicit drugs  Current medications and supplements including opioid prescriptions. {Opioid [phone]} Functional ability and status Nutritional status Physical activity Advanced directives List of other physicians Hospitalizations, surgeries, and ER visits in previous 12 months Vitals Screenings to include cognitive, depression, and falls Referrals and appointments  In addition, I have reviewed and discussed with patient certain preventive protocols, quality metrics, and best practice recommendations. A written personalized care plan for preventive services as well as general preventive health recommendations were provided to patient.     Vivia [place], [place]   07/29/2022   Nurse Notes: face to face 25 min

## 2022-07-30 LAB — COMPREHENSIVE METABOLIC PANEL
ALT: 25 IU/L (ref 0–32)
AST: 35 IU/L (ref 0–40)
Albumin/Globulin Ratio: 1.5 (ref 1.2–2.2)
Albumin: 4.1 g/dL (ref 3.7–4.7)
Alkaline Phosphatase: 67 IU/L (ref 44–121)
BUN/Creatinine Ratio: 19 (ref 12–28)
BUN: 22 mg/dL (ref 8–27)
Bilirubin Total: 0.7 mg/dL (ref 0.0–1.2)
CO2: 23 mmol/L (ref 20–29)
Calcium: 9.3 mg/dL (ref 8.7–10.3)
Chloride: 98 mmol/L (ref 96–106)
Creatinine, Ser: 1.16 mg/dL — ABNORMAL HIGH (ref 0.57–1.00)
Globulin, Total: 2.8 g/dL (ref 1.5–4.5)
Glucose: 87 mg/dL (ref 70–99)
Potassium: 4.5 mmol/L (ref 3.5–5.2)
Sodium: 138 mmol/L (ref 134–144)
Total Protein: 6.9 g/dL (ref 6.0–8.5)
eGFR: 47 mL/min/{1.73_m2} — ABNORMAL LOW (ref >59)

## 2022-07-30 LAB — CBC
Hematocrit: 38.5 % (ref 34.0–46.6)
Hemoglobin: 13.2 g/dL (ref 11.1–15.9)
MCH: 31 pg (ref 26.6–33.0)
MCHC: 34.3 g/dL (ref 31.5–35.7)
MCV: 90 fL (ref 79–97)
Platelets: 238 10*3/uL (ref 150–450)
RBC: 4.26 x10E6/uL (ref 3.77–5.28)
RDW: 12.6 % (ref 11.7–15.4)
WBC: 8 10*3/uL (ref 3.4–10.8)

## 2022-07-30 LAB — LIPID PANEL
Chol/HDL Ratio: 3.3 ratio (ref 0.0–4.4)
Cholesterol, Total: 168 mg/dL (ref 100–199)
HDL: 51 mg/dL (ref >39)
LDL Chol Calc (NIH): 100 mg/dL — ABNORMAL HIGH (ref 0–99)
Triglycerides: 91 mg/dL (ref 0–149)
VLDL Cholesterol Cal: 17 mg/dL (ref 5–40)

## 2022-07-30 LAB — HEMOGLOBIN A1C
Est. average glucose Bld gHb Est-mCnc: 120 mg/dL
Hgb A1c MFr Bld: 5.8 % — ABNORMAL HIGH (ref 4.8–5.6)

## 2022-07-30 LAB — VITAMIN D 25 HYDROXY (VIT D DEFICIENCY, FRACTURES): Vit D, 25-Hydroxy: 64.2 ng/mL (ref 30.0–100.0)

## 2022-07-30 LAB — TSH: TSH: 2.14 u[IU]/mL (ref 0.450–4.500)

## 2022-08-12 DIAGNOSIS — M9904 Segmental and somatic dysfunction of sacral region: Secondary | ICD-10-CM | POA: Diagnosis not present

## 2022-08-12 DIAGNOSIS — M9903 Segmental and somatic dysfunction of lumbar region: Secondary | ICD-10-CM | POA: Diagnosis not present

## 2022-08-12 DIAGNOSIS — M9902 Segmental and somatic dysfunction of thoracic region: Secondary | ICD-10-CM | POA: Diagnosis not present

## 2022-08-12 DIAGNOSIS — M47895 Other spondylosis, thoracolumbar region: Secondary | ICD-10-CM | POA: Diagnosis not present

## 2022-08-12 DIAGNOSIS — M47817 Spondylosis without myelopathy or radiculopathy, lumbosacral region: Secondary | ICD-10-CM | POA: Diagnosis not present

## 2022-08-12 DIAGNOSIS — M5388 Other specified dorsopathies, sacral and sacrococcygeal region: Secondary | ICD-10-CM | POA: Diagnosis not present

## 2022-09-03 ENCOUNTER — Ambulatory Visit (INDEPENDENT_AMBULATORY_CARE_PROVIDER_SITE_OTHER): Payer: Medicare Other | Admitting: Nurse Practitioner

## 2022-09-03 VITALS — BP 136/77 | HR 65 | Resp 18 | Ht 63.0 in | Wt 172.0 lb

## 2022-09-03 DIAGNOSIS — Z0000 Encounter for general adult medical examination without abnormal findings: Secondary | ICD-10-CM | POA: Diagnosis not present

## 2022-09-03 NOTE — Progress Notes (Signed)
Subjective:   Kelli [name] is a 81 y.o. female who presents for Medicare Annual (Subsequent) preventive examination. -had "House Calls" visit in October 2023.  --reported that she had a little less circulation in left foot than right  ---patient states that sometimes, she has some mild swelling in the left foot when on her feet for long periods of time ---denies pain, coldness, or cramping.  ---(as a side note, she states that sometimes she takes only 1/2 of her fluid pill).  ---continues to work at daycare center  -patient states that she has noted some increased shortness of breath with exertion  -has noted some decreased general strength.   Review of Systems    See HPI       Objective:    Today's Vitals   09/03/22 1355  BP: 136/77  Pulse: 65  Resp: 18  SpO2: 95%  Weight: 172 lb (78 kg)  Height: '5\' 3"'$  (1.6 m)   Body mass index is 30.47 kg/m.    Row Labels 06/15/2019    8:13 AM 12/24/2013    3:00 AM 03/09/2013    8:10 PM  Advanced Directives   Section Header. No data exists in this row.     Does Patient Have a Medical Advance Directive?   Yes Patient has advance directive, copy not in chart Patient has advance directive, copy not in chart  Type of Advance Directive    Living will Living will;Healthcare Power of Attorney  Does patient want to make changes to medical advance directive?    No change requested   Copy of [place] in Chart?    Copy requested from family Copy requested from family  Pre-existing out of facility DNR order (yellow form or pink MOST form)    No     Current Medications (verified) Outpatient Encounter Medications as of 09/03/2022  Medication Sig   acetaminophen (TYLENOL) 500 MG tablet Take 500 mg by mouth daily as needed for pain.   apixaban (ELIQUIS) 5 MG TABS tablet Take 1 tablet (5 mg total) by mouth 2 (two) times daily.   atorvastatin (LIPITOR) 10 MG tablet Take 1 tablet (10 mg total) by mouth daily.   azithromycin  (ZITHROMAX) 250 MG tablet z-pack - take as directed for 5 days   Calcium Carb-Cholecalciferol (CALCIUM + D3) 600-200 MG-UNIT TABS Take 1 tablet by mouth daily.   Cholecalciferol (VITAMIN D) 400 UNITS capsule Take 400 Units by mouth daily.   Docusate Calcium (STOOL SOFTENER PO) Take by mouth at bedtime.   ELDERBERRY PO Take by mouth.   FIBER ADULT GUMMIES PO Take 1 Dose by mouth daily.   fish oil-omega-3 fatty acids 1000 MG capsule Take 1 g by mouth daily.   Flaxseed, Linseed, (FLAX SEED OIL PO) Take by mouth daily.   fluticasone (FLONASE) 50 MCG/ACT nasal spray Place 2 sprays into both nostrils daily.   hydrochlorothiazide (HYDRODIURIL) 12.5 MG tablet Take 1 tablet (12.5 mg total) by mouth daily.   losartan (COZAAR) 50 MG tablet Take 1 tablet (50 mg total) by mouth daily.   Magnesium 300 MG CAPS Take 1 capsule by mouth daily.   methylPREDNISolone (MEDROL) 4 MG TBPK tablet Take by mouth as directed for 6 days   metoprolol tartrate (LOPRESSOR) 50 MG tablet TAKE 1 TABLET BY MOUTH IN THE MORNING AND 1/2 TABLET IN THE AFTERNOON. NEED APPOINTMENT FOR REFILLS   Probiotic Product (PROBIOTIC DAILY PO) Take 1 tablet by mouth daily as needed.  vitamin C (ASCORBIC ACID) 500 MG tablet Take 500 mg by mouth daily.   No facility-administered encounter medications on file as of 09/03/2022.    Allergies (verified) Amoxicillin and Augmentin [amoxicillin-pot clavulanate]   History: Past Medical History:  Diagnosis Date   Allergy    Phreesia 07/04/2020   Arthritis    [facility]reesia 07/04/2020   Atrial fibrillation ([place]) 02/2013   paroxsymal; c/b Type 2 NSTEMI; CHADS2-VASc=2 (age, sex - patient had come off anti-HTN meds in past); Eliquis started;  Echocardiogram 03/10/13: EF 97-98%, grade 1 diastolic dysfunction, mild MR, mild LAE   GERD (gastroesophageal reflux disease)    Phreesia 07/04/2020   Hyperlipidemia    Hypertension    Myocardial infarction [facility])    Phreesia 07/04/2020   NSTEMI (non-ST elevated  myocardial infarction) ([place]) 02/2013   demand ischemia in setting of AFib with RVR; ETT-Myoview 03/11/13: No ischemia or scar, EF 70%.   Past Surgical History:  Procedure Laterality Date   APPENDECTOMY     CATARACT EXTRACTION     CATARACT EXTRACTION, BILATERAL     EYE SURGERY     STRABISMUS SURGERY     TONSILLECTOMY     YAG LASER APPLICATION     Family History  Problem Relation Age of Onset   Heart attack Mother 2   Cancer Maternal Aunt        throat   Social History   Socioeconomic History   Marital status: Widowed    Spouse name: Not on file   Number of children: 3   Years of education: Not on file   Highest education level: Not on file  Occupational History   Occupation: Surveyor, quantity- Day care    Employer: [employer]  Tobacco Use   Smoking status: Never   Smokeless tobacco: Never  Vaping Use   Vaping Use: Never used  Substance and Sexual Activity   Alcohol use: Yes    Comment: 1-2 glasses of wine occ.   Drug use: No   Sexual activity: Not Currently  Other Topics Concern   Not on file  Social History Narrative   Not on file   Social Determinants of Health   Financial Resource Strain: Low Risk  (09/03/2022)   Overall Financial Resource Strain (CARDIA)    Difficulty of Paying Living Expenses: Not hard at all  Food Insecurity: No Food Insecurity (09/03/2022)   Hunger Vital Sign    Worried About Running Out of Food in the Last Year: Never true    Ran Out of Food in the Last Year: Never true  Transportation Needs: No Transportation Needs (09/03/2022)   PRAPARE - Hydrologist (Medical): No    Lack of Transportation (Non-Medical): No  Physical Activity: Sufficiently Active (09/03/2022)   Exercise Vital Sign    Days of Exercise per Week: 7 days    Minutes of Exercise per Session: 150+ min  Stress: No Stress Concern Present (09/03/2022)   [place]    Feeling of Stress : Not at all  Social Connections: Moderately Integrated (09/03/2022)   Social Connection and Isolation Panel [NHANES]    Frequency of Communication with Friends and Family: More than three times a week    Frequency of Social Gatherings with Friends and Family: Once a week    Attends Religious Services: More than 4 times per year    Active Member of [employer] or Organizations: Yes    Attends Club or  Organization Meetings: More than 4 times per year    Marital Status: Widowed  Recent Concern: Social Connections - Moderately Isolated (09/03/2022)   Social Connection and Isolation Panel [NHANES]    Frequency of Communication with Friends and Family: Once a week    Frequency of Social Gatherings with Friends and Family: Once a week    Attends Religious Services: More than 4 times per year    Active Member of [employer] or Organizations: Yes    Attends Archivist Meetings: More than 4 times per year    Marital Status: Widowed    Tobacco Counseling Counseling given: Not Answered   Clinical Intake:  Pre-visit preparation completed: Yes  Pain : No/denies pain     BMI - recorded: 30.47 Nutritional Status: BMI > 30  Obese Nutritional Risks: None Diabetes: No  How often do you need to have someone help you when you read instructions, pamphlets, or other written materials from your doctor or pharmacy?: 1 - Never  Diabetic? No  Interpreter Needed?: No      Activities of Daily Living   Row Labels 09/03/2022    2:08 PM 07/29/2022   11:09 AM  In your present state of health, do you have any difficulty performing the following activities:   Section Header. No data exists in this row.    Hearing?   0 0  Vision?   0 0  Difficulty concentrating or making decisions?   0 0  Walking or climbing stairs?   1 0  Dressing or bathing?   0 0  Doing errands, shopping?   0 0    Patient Care Team: Lorrene Reid, PA-C as PCP - General [place], Peter M,  MD as PCP - Cardiology (Cardiology) [place], Peter M, MD as Consulting Physician (Cardiology) Allyn Kenner, MD as Consulting Physician (Dermatology) Wonda Horner, MD as Consulting Physician (Gastroenterology) Care, [facility]  Indicate any recent Medical Services you may have received from other than [facility] providers in the past year (date may be approximate).     Assessment:   This is a routine wellness examination for Kelli [name].  Hearing/Vision screen No results found.  Dietary issues and exercise activities discussed:     Goals Addressed   None   Depression Screen   Row Labels 09/03/2022    2:07 PM 07/29/2022   11:09 AM 05/27/2022    1:14 PM 09/05/2021   11:32 AM 07/18/2021    9:03 AM 07/05/2020    1:13 PM 02/09/2020   10:05 AM  PHQ 2/9 Scores   Section Header. No data exists in this row.         PHQ - 2 Score   0 0 0 0 0 0 0  PHQ- 9 Score   2 1 0 1 0 1 0    Fall Risk   Row Labels 09/03/2022    2:08 PM 09/05/2021   11:34 AM 07/18/2021    9:04 AM 07/05/2020    1:12 PM 06/15/2019    8:13 AM  Fall Risk    Section Header. No data exists in this row.       Falls in the past year?   0 0 0 0 1  Number falls in past yr:   1 0 0  0  Injury with Fall?   0 0 0  1  Risk for fall due to :      No Fall Risks   Follow up    Falls evaluation  completed Falls evaluation completed Falls evaluation completed     FALL RISK PREVENTION PERTAINING TO THE HOME:  Any stairs in or around the home? Yes  If so, are there any without handrails? No  Home free of loose throw rugs in walkways, pet beds, electrical cords, etc? Yes  Adequate lighting in your home to reduce risk of falls? Yes   ASSISTIVE DEVICES UTILIZED TO PREVENT FALLS:  Life alert? No  Use of a cane, walker or w/c? No  Grab bars in the bathroom? Yes  Shower chair or bench in shower? No  Elevated toilet seat or a handicapped toilet? Yes   TIMED UP AND GO:  Was the test performed? Yes .  Length of time to ambulate 10  feet: 10-20 sec.   Gait steady and fast without use of assistive device  Cognitive Function:       Row Labels 09/03/2022    1:59 PM 07/05/2020    1:16 PM 06/15/2019    8:14 AM 12/22/2017    8:30 AM  6CIT Screen   Section Header. No data exists in this row.      What Year?   0 points 0 points 0 points 0 points  What month?   0 points 0 points 0 points 0 points  What time?   0 points 0 points 0 points 0 points  Count back from 20   0 points 0 points 0 points 0 points  Months in reverse   0 points 0 points 0 points 0 points  Repeat phrase   0 points 0 points 0 points 0 points  Total Score   0 points 0 points 0 points 0 points    Immunizations Immunization History  Administered Date(s) Administered   Fluad Quad(high Dose 65+) 08/16/2019   Influenza, High Dose Seasonal PF 08/09/2018   Influenza,inj,Quad PF,6+ Mos 07/18/2021, 07/29/2022   Moderna Sars-Covid-2 Vaccination 10/22/2019, 11/08/2019   Pneumococcal Polysaccharide-23 06/12/2009   Tdap 07/16/2011    TDAP status: Up to date  Flu Vaccine status: Up to date  Pneumococcal vaccine status: Declined,  Education has been provided regarding the importance of this vaccine but patient still declined. Advised may receive this vaccine at local pharmacy or Health Dept. Aware to provide a copy of the vaccination record if obtained from local pharmacy or Health Dept. Verbalized acceptance and understanding.   Covid-19 vaccine status: Information provided on how to obtain vaccines.   Qualifies for Shingles Vaccine? Yes   Zostavax completed Yes   Shingrix Completed?: Yes  Screening Tests Health Maintenance  Topic Date Due   Zoster Vaccines- Shingrix (1 of 2) Never done   Pneumonia Vaccine 75+ Years old (2 - PCV) 06/12/2010   COVID-19 Vaccine (3 - Moderna risk series) 12/06/2019   DTaP/Tdap/Td (2 - Td or Tdap) 07/15/2021   Medicare Annual Wellness (AWV)  07/18/2022   INFLUENZA VACCINE  Completed   DEXA SCAN  Completed   HPV  VACCINES  Aged Out    Health Maintenance  Health Maintenance Due  Topic Date Due   Zoster Vaccines- Shingrix (1 of 2) Never done   Pneumonia Vaccine 11+ Years old (2 - PCV) 06/12/2010   COVID-19 Vaccine (3 - Moderna risk series) 12/06/2019   DTaP/Tdap/Td (2 - Td or Tdap) 07/15/2021   Medicare Annual Wellness (AWV)  07/18/2022    Colorectal cancer screening: No longer required.   Mammogram status: No longer required due to age.  Bone Density status: Completed 11/22/2018. Results reflect: Bone density  results: OSTEOPENIA. Repeat every 2-3 years.  Lung Cancer Screening: (Low Dose CT Chest recommended if Age 80-80 years, 30 pack-year currently smoking OR have quit w/in 15years.) does not qualify.   Lung Cancer Screening Referral: None smoker   Additional Screening:  Hepatitis C Screening: does not qualify; Completed 11/23/2015  Vision Screening: Recommended annual ophthalmology exams for early detection of glaucoma and other disorders of the eye. Is the patient up to date with their annual eye exam?  No  Who is the provider or what is the name of the office in which the patient attends annual eye exams? Marica Otter, OD, PA If pt is not established with a provider, would they like to be referred to a provider to establish care?  Patient established .   Dental Screening: Recommended annual dental exams for proper oral hygiene  Community Resource Referral / Chronic Care Management: CRR required this visit?  No   CCM required this visit?  No      Plan:     I have personally reviewed and noted the following in the patient's chart:   Medical and social history Use of alcohol, tobacco or illicit drugs  Current medications and supplements including opioid prescriptions. Patient is not currently taking opioid prescriptions. Functional ability and status Nutritional status Physical activity Advanced directives List of other physicians Hospitalizations, surgeries, and ER  visits in previous 12 months Vitals Screenings to include cognitive, depression, and falls Referrals and appointments  In addition, I have reviewed and discussed with patient certain preventive protocols, quality metrics, and best practice recommendations. A written personalized care plan for preventive services as well as general preventive health recommendations were provided to patient.     Ronnell Freshwater, NP   09/21/2022   Nurse Notes: face to face 20 minutes

## 2022-09-21 ENCOUNTER — Encounter: Payer: Self-pay | Admitting: Nurse Practitioner

## 2022-10-15 DIAGNOSIS — M9904 Segmental and somatic dysfunction of sacral region: Secondary | ICD-10-CM | POA: Diagnosis not present

## 2022-10-15 DIAGNOSIS — M47817 Spondylosis without myelopathy or radiculopathy, lumbosacral region: Secondary | ICD-10-CM | POA: Diagnosis not present

## 2022-10-15 DIAGNOSIS — M9903 Segmental and somatic dysfunction of lumbar region: Secondary | ICD-10-CM | POA: Diagnosis not present

## 2022-10-15 DIAGNOSIS — M47895 Other spondylosis, thoracolumbar region: Secondary | ICD-10-CM | POA: Diagnosis not present

## 2022-10-15 DIAGNOSIS — M9902 Segmental and somatic dysfunction of thoracic region: Secondary | ICD-10-CM | POA: Diagnosis not present

## 2022-10-15 DIAGNOSIS — M5388 Other specified dorsopathies, sacral and sacrococcygeal region: Secondary | ICD-10-CM | POA: Diagnosis not present

## 2022-11-12 DIAGNOSIS — M5388 Other specified dorsopathies, sacral and sacrococcygeal region: Secondary | ICD-10-CM | POA: Diagnosis not present

## 2022-11-12 DIAGNOSIS — M9903 Segmental and somatic dysfunction of lumbar region: Secondary | ICD-10-CM | POA: Diagnosis not present

## 2022-11-12 DIAGNOSIS — M47895 Other spondylosis, thoracolumbar region: Secondary | ICD-10-CM | POA: Diagnosis not present

## 2022-11-12 DIAGNOSIS — M47817 Spondylosis without myelopathy or radiculopathy, lumbosacral region: Secondary | ICD-10-CM | POA: Diagnosis not present

## 2022-11-12 DIAGNOSIS — M9904 Segmental and somatic dysfunction of sacral region: Secondary | ICD-10-CM | POA: Diagnosis not present

## 2022-11-12 DIAGNOSIS — M9902 Segmental and somatic dysfunction of thoracic region: Secondary | ICD-10-CM | POA: Diagnosis not present

## 2022-11-17 DIAGNOSIS — L820 Inflamed seborrheic keratosis: Secondary | ICD-10-CM | POA: Diagnosis not present

## 2022-11-17 DIAGNOSIS — D225 Melanocytic nevi of trunk: Secondary | ICD-10-CM | POA: Diagnosis not present

## 2022-11-17 DIAGNOSIS — Z1283 Encounter for screening for malignant neoplasm of skin: Secondary | ICD-10-CM | POA: Diagnosis not present

## 2022-12-24 DIAGNOSIS — M9901 Segmental and somatic dysfunction of cervical region: Secondary | ICD-10-CM | POA: Diagnosis not present

## 2022-12-24 DIAGNOSIS — M9904 Segmental and somatic dysfunction of sacral region: Secondary | ICD-10-CM | POA: Diagnosis not present

## 2022-12-24 DIAGNOSIS — M4304 Spondylolysis, thoracic region: Secondary | ICD-10-CM | POA: Diagnosis not present

## 2022-12-24 DIAGNOSIS — M5388 Other specified dorsopathies, sacral and sacrococcygeal region: Secondary | ICD-10-CM | POA: Diagnosis not present

## 2022-12-24 DIAGNOSIS — M47817 Spondylosis without myelopathy or radiculopathy, lumbosacral region: Secondary | ICD-10-CM | POA: Diagnosis not present

## 2022-12-24 DIAGNOSIS — M9903 Segmental and somatic dysfunction of lumbar region: Secondary | ICD-10-CM | POA: Diagnosis not present

## 2022-12-24 DIAGNOSIS — M47892 Other spondylosis, cervical region: Secondary | ICD-10-CM | POA: Diagnosis not present

## 2022-12-24 DIAGNOSIS — M9902 Segmental and somatic dysfunction of thoracic region: Secondary | ICD-10-CM | POA: Diagnosis not present

## 2023-01-08 DIAGNOSIS — H53143 Visual discomfort, bilateral: Secondary | ICD-10-CM | POA: Diagnosis not present

## 2023-01-08 DIAGNOSIS — H35372 Puckering of macula, left eye: Secondary | ICD-10-CM | POA: Diagnosis not present

## 2023-01-21 DIAGNOSIS — M5388 Other specified dorsopathies, sacral and sacrococcygeal region: Secondary | ICD-10-CM | POA: Diagnosis not present

## 2023-01-21 DIAGNOSIS — M9902 Segmental and somatic dysfunction of thoracic region: Secondary | ICD-10-CM | POA: Diagnosis not present

## 2023-01-21 DIAGNOSIS — M47817 Spondylosis without myelopathy or radiculopathy, lumbosacral region: Secondary | ICD-10-CM | POA: Diagnosis not present

## 2023-01-21 DIAGNOSIS — M4304 Spondylolysis, thoracic region: Secondary | ICD-10-CM | POA: Diagnosis not present

## 2023-01-21 DIAGNOSIS — M9903 Segmental and somatic dysfunction of lumbar region: Secondary | ICD-10-CM | POA: Diagnosis not present

## 2023-01-21 DIAGNOSIS — M9904 Segmental and somatic dysfunction of sacral region: Secondary | ICD-10-CM | POA: Diagnosis not present

## 2023-02-02 ENCOUNTER — Ambulatory Visit (INDEPENDENT_AMBULATORY_CARE_PROVIDER_SITE_OTHER): Payer: Medicare Other | Admitting: Family Medicine

## 2023-02-02 ENCOUNTER — Encounter: Payer: Self-pay | Admitting: Family Medicine

## 2023-02-02 VITALS — BP 130/80 | HR 67 | Temp 98.2°F | Ht 63.0 in | Wt 169.0 lb

## 2023-02-02 DIAGNOSIS — R5383 Other fatigue: Secondary | ICD-10-CM

## 2023-02-02 DIAGNOSIS — R519 Headache, unspecified: Secondary | ICD-10-CM | POA: Diagnosis not present

## 2023-02-02 NOTE — Patient Instructions (Signed)
It was nice to meet you today,   I will get some labs to evaluate your fatigue.  You will need to come back in the morning to get the labs drawn.    I would like to see you again in 1-2 months.   Have a great day,   Dr. Constance Goltz

## 2023-02-02 NOTE — Progress Notes (Signed)
   Established Patient Office Visit  Subjective   Patient ID: Kelli [name], female    DOB: July 15, 1941  Age: 82 y.o. MRN: [id number]  Chief Complaint  Patient presents with   Shortness of Breath    Since november     Dyspnea-patient first noticed in November that she was becoming more short of breath when walking.  She was traveling to [place] in [place] medication and having trouble keeping up with her family.  Patient still works at a daycare and also noticed she is having more difficulty going up the stairs there.  Sometimes it feels like more of a "pressure in her back in epigastric region when this occurs.  She sleeps on her side and does not have any orthopnea, no PND, no nausea or vomiting or chest pain.  Sees a cardiologist for atrial fibrillation, Dr. [place].  Patient also complaining "pressure" in the back of her head.  Has been there since Christmas.  Sensation is not constant, feels it perhaps once a day when she is not distracted.  Has an eye doctor that she recently went to an and some mild generalized worsening of the patient but no diplopia or other changes in vision.      ROS    Objective:     BP 130/80   Pulse 67   Temp 98.2 F (36.8 C) (Oral)   Ht 5\' 3"  (1.6 m)   Wt 169 lb (76.7 kg)   SpO2 96%   BMI 29.94 kg/m    Physical Exam General: Elderly female appears younger than stated age HEENT: PERRLA, EOMI CV: Regular rate and rhythm Pulmonary: Lungs clear bilaterally   No results found for any visits on 02/02/23.    The ASCVD Risk score (Arnett DK, et al., 2019) failed to calculate for the following reasons:   The 2019 ASCVD risk score is only valid for ages 28 to 62   The patient has a prior MI or stroke diagnosis    Assessment & Plan:   Problem List Items Addressed This Visit       Other   Fatigue - Primary    Normal labs approximately 9 months ago.  Will repeat TSH and CBC.  Will also get BNP. Echo from 10 years ago showed normal  systolic function and EF.      Relevant Orders   TSH   CBC   B Nat Peptide   Pressure in head    Occurs in the posterior aspect of the head.  Several months in duration.  Mild.  Notices it once or twice a day.  No vision changes, no nausea or vomiting. CT head in 2020.  Consider repeating if this continues to be an issue       Return in about 4 weeks (around 03/02/2023) for routine health maint; headache.    [name], MD

## 2023-02-03 ENCOUNTER — Other Ambulatory Visit: Payer: Medicare Other

## 2023-02-03 DIAGNOSIS — R5383 Other fatigue: Secondary | ICD-10-CM

## 2023-02-03 DIAGNOSIS — R0609 Other forms of dyspnea: Secondary | ICD-10-CM | POA: Diagnosis not present

## 2023-02-03 DIAGNOSIS — R519 Headache, unspecified: Secondary | ICD-10-CM | POA: Insufficient documentation

## 2023-02-03 NOTE — Assessment & Plan Note (Signed)
Occurs in the posterior aspect of the head.  Several months in duration.  Mild.  Notices it once or twice a day.  No vision changes, no nausea or vomiting. CT head in 2020.  Consider repeating if this continues to be an issue

## 2023-02-03 NOTE — Assessment & Plan Note (Signed)
Normal labs approximately 9 months ago.  Will repeat TSH and CBC.  Will also get BNP. Echo from 10 years ago showed normal systolic function and EF.

## 2023-02-04 LAB — CBC
Hematocrit: 39.9 % (ref 34.0–46.6)
Hemoglobin: 13.7 g/dL (ref 11.1–15.9)
MCH: 30.7 pg (ref 26.6–33.0)
MCHC: 34.3 g/dL (ref 31.5–35.7)
MCV: 90 fL (ref 79–97)
Platelets: 281 10*3/uL (ref 150–450)
RBC: 4.46 x10E6/uL (ref 3.77–5.28)
RDW: 12.2 % (ref 11.7–15.4)
WBC: 8.9 10*3/uL (ref 3.4–10.8)

## 2023-02-04 LAB — BRAIN NATRIURETIC PEPTIDE: BNP: 150.8 pg/mL — ABNORMAL HIGH (ref 0.0–100.0)

## 2023-02-04 LAB — TSH: TSH: 1.59 u[IU]/mL (ref 0.450–4.500)

## 2023-02-06 ENCOUNTER — Telehealth: Payer: Self-pay | Admitting: Cardiology

## 2023-02-06 NOTE — Telephone Encounter (Signed)
Pt c/o Shortness Of Breath: STAT if SOB developed within the last 24 hours or pt is noticeably SOB on the phone  1. Are you currently SOB (can you hear that pt is SOB on the phone)? No   2. How long have you been experiencing SOB? 4 months, but the last 6 weeks it's been worse.   3. Are you SOB when sitting or when up moving around? Moving around   4. Are you currently experiencing any other symptoms? Sometimes she feels pressure in her back and lacks strength to pick up heavier items.

## 2023-02-06 NOTE — Telephone Encounter (Signed)
Patient states SOB increasing over months.  She went to new PCP and he states based o her labs she might be in heart failure.  She is set to see NP on Chanell 3rd to discuss changes in symptoms

## 2023-02-13 NOTE — Progress Notes (Unsigned)
Cardiology Office Note:    Date:  02/16/2023   ID:  Kelli [name], DOB 02-29-1941, MRN [id number]  PCP:  [name], MD   [facility] Providers Cardiologist:  Peter Swaziland, MD Cardiology APP:  Marcelino Duster, [place] { Referring MD: [name], MD   Chief Complaint  Patient presents with   Follow-up    SOB / DOE    History of Present Illness:    Kelli [name] is a 82 y.o. female with a hx of paroxysmal atrial fibrillation, hypertension, hyperlipidemia, and chronic anticoagulation.  She was admitted 2014 with demand ischemia.  Myoview was nonischemic.  Echocardiogram K[place]ren 2014 with preserved EF, G1 DD.  PAF controlled with metoprolol and anticoagulated with Eliquis.  She was last seen by Dr. Swaziland on/11/23 and was doing well at that time.  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  She was seen by her PCP 02/02/2023 and reported shortness of breath since November.  While traveling in [place] in [place] in November she was having trouble keeping up with her family.  She also reported a pressure in the back of her head.    BNP 151 Hb 13.7 WBC 8.9 TSH 1.59 A1c 5.8%  PCP felt she may be in heart failure and recommended an urgent appt. She was placed on my schedule.   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 She notices shortness of breath with walking the dog quickly (20 min) or up a hill. She also has SOB with lifting heavy loads that she used to do without issues. She also stated she has a pressure in the back of her head. She has the pressure in head with lifting and in her neck/back. She has a history of cervical fusion. She specifically denies chest pain.   Past Medical History:  Diagnosis Date   Allergy    Phreesia 07/04/2020   Arthritis    Phreesia 07/04/2020   Atrial fibrillation (HCC) 02/2013   paroxsymal; c/b Type 2 NSTEMI; CHADS2-VASc=2 (age, sex - patient had come off anti-HTN meds in  past); Eliquis started;  Echocardiogram 03/10/13: EF 55-60%, grade 1 diastolic dysfunction, mild MR, mild LAE   GERD (gastroesophageal reflux disease)    Phreesia 07/04/2020   Hyperlipidemia    Hypertension    Myocardial infarction [facility])    Phreesia 07/04/2020   NSTEMI (non-ST elevated myocardial infarction) (HCC) 02/2013   demand ischemia in setting of AFib with RVR; ETT-Myoview 03/11/13: No ischemia or scar, EF 70%.    Past Surgical History:  Procedure Laterality Date   APPENDECTOMY     CATARACT EXTRACTION     CATARACT EXTRACTION, BILATERAL     EYE SURGERY     STRABISMUS SURGERY     TONSILLECTOMY     YAG LASER APPLICATION      Current Medications: Current Meds  Medication Sig   acetaminophen (TYLENOL) 500 MG tablet Take 500 mg by mouth daily as needed for pain.   apixaban (ELIQUIS) 5 MG TABS tablet Take 1 tablet (5 mg total) by mouth 2 (two) times daily.   atorvastatin (LIPITOR) 10 MG tablet Take 1 tablet (10 mg total) by mouth daily.   azithromycin (ZITHROMAX) 250 MG tablet z-pack - take as directed for 5 days   Calcium Carb-Cholecalciferol (CALCIUM + D3) 600-200 MG-UNIT TABS Take 1 tablet by mouth daily.   Cholecalciferol (VITAMIN D) 400 UNITS capsule Take 400 Units by  mouth daily.   Docusate Calcium (STOOL SOFTENER PO) Take by mouth at bedtime.   ELDERBERRY PO Take by mouth.   FIBER ADULT GUMMIES PO Take 1 Dose by mouth daily.   fish oil-omega-3 fatty acids 1000 MG capsule Take 1 g by mouth daily.   Flaxseed, Linseed, (FLAX SEED OIL PO) Take by mouth daily.   fluticasone (FLONASE) 50 MCG/ACT nasal spray Place 2 sprays into both nostrils daily.   hydrochlorothiazide (HYDRODIURIL) 12.5 MG tablet Take 1 tablet (12.5 mg total) by mouth daily.   hydrochlorothiazide (HYDRODIURIL) 12.5 MG tablet Take 6.125 mg by mouth daily. Take 1/2 Tablet Daily   losartan (COZAAR) 50 MG tablet Take 1 tablet (50 mg total) by mouth daily.   Magnesium 300 MG CAPS Take 1 capsule by mouth daily.    methylPREDNISolone (MEDROL) 4 MG TBPK tablet Take by mouth as directed for 6 days   metoprolol tartrate (LOPRESSOR) 50 MG tablet TAKE 1 TABLET BY MOUTH IN THE MORNING AND 1/2 TABLET IN THE AFTERNOON. NEED APPOINTMENT FOR REFILLS   Probiotic Product (PROBIOTIC DAILY PO) Take 1 tablet by mouth daily as needed.    vitamin C (ASCORBIC ACID) 500 MG tablet Take 500 mg by mouth daily.     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   Drug use: No   Sexual activity: Not Currently  Other Topics Concern   Not on file  Social History Narrative   Not on file   Social Determinants of Health   Financial Resource Strain: Low Risk  (09/03/2022)   Overall Financial Resource Strain (CARDIA)    Difficulty of Paying Living Expenses: Not hard at all  Food Insecurity: No Food Insecurity (09/03/2022)   Hunger Vital Sign    Worried About Running Out of Food in the Last Year: Never true    Ran Out of Food in the Last Year: Never true  Transportation Needs: No Transportation Needs (09/03/2022)   PRAPARE - Administrator, Civil Service (Medical): No    Lack of Transportation (Non-Medical): No  Physical Activity: Sufficiently Active (09/03/2022)   Exercise Vital Sign    Days of Exercise per Week: 7 days    Minutes of Exercise per Session: 150+ min  Stress: No Stress Concern Present (09/03/2022)   [employer] of Occupational Health - Occupational Stress Questionnaire     Feeling of Stress : Not at all  Social Connections: Moderately Integrated (09/03/2022)   Social Connection and Isolation Panel [NHANES]    Frequency of Communication with Friends and Family: More than three times a week    Frequency of Social Gatherings with Friends and Family: Once a week    Attends Religious Services: More than 4 times per year    Active Member of [employer] or Organizations: Yes    Attends Banker Meetings: More than 4 times per year    Marital Status: Widowed  Recent Concern: Social Connections - Moderately Isolated (09/03/2022)   Social Connection and Isolation Panel [NHANES]    Frequency of Communication with Friends and Family: Once a week    Frequency of Social Gatherings with Friends and Family: Once a week    Attends Religious Services: More than 4 times per year    Active Member of [employer] or Organizations: Yes    Attends Banker Meetings: More than 4 times per year    Marital Status: Widowed     Family History: The patient's family history includes Cancer in her maternal aunt; Heart attack (age of onset: 26) in her mother.  ROS:   Please see the history of present illness.     All other systems reviewed and are negative.  EKGs/Labs/Other Studies Reviewed:    The following studies were reviewed today:  Cardiac Studies & Procedures     STRESS TESTS  NM MYOCAR MULTI W/SPECT W 03/11/2013  Narrative This examination was dictated by the cardiologist who supervised the examination.  Please see that report for details.  Identification the patient is a 82 year old with history of chest pain test to evaluate rule out ischemia  Stress data:  The patient exercised in the Bruce protocol. Baseline EKG showed sinus rhythm 68 beats per minute baseline blood pressure 133/67  The patient exercised for 8 minutes 59 seconds to a peak heart rate of 133 which was 89% predicted maximal peak blood pressure 241/47 consistent with hypertensive  response.  She complained of no chest pain EKG showed no changes to suggest ischemia  Nuclear data:  The patient was studied in 1-day rest stress protocol she was injected with 10 mCi technetium 99 labeled sestamibi at rest, 30 mCi technetium 99 labeled sestamibi at stress images were reconstructed the short, vertical, horizontal axes.  In the initial stress images there is overall normal perfusion.  In the recovery images there is mild thinning in the inferolateral wall.  On gating LVEF was calculated at greater than 70%.  Review of the raw data the inferolateral changes at rest most likely reflects soft tissue (diaphragm)  Impression:  Overall normal perfusion.  No evidence for ischemia or scar.  LVEF on gating calculated greater than 70%.   Original Report Authenticated By: Rudie Meyer, M.D.               EKG:  EKG is  ordered today.  The ekg ordered today demonstrates NSR 62, poor R wave progression  Recent Labs: 07/29/2022: ALT 25; BUN 22; Creatinine, Ser 1.16; Potassium 4.5; Sodium 138 02/03/2023: BNP 150.8; Hemoglobin 13.7; Platelets 281; TSH 1.590  Recent Lipid Panel    Component Value Date/Time   CHOL 168 07/29/2022 0943   TRIG 91 07/29/2022 0943   HDL 51 07/29/2022 0943   CHOLHDL 3.3 07/29/2022 0943   CHOLHDL 3.5 03/10/2013 0500   VLDL 14 03/10/2013 0500   LDLCALC 100 (H) 07/29/2022 0943     Risk Assessment/Calculations:    CHA2DS2-VASc Score = 4   This indicates a 4.8% annual risk of stroke. The patient's score is based upon: CHF History: 0 HTN History: 1 Diabetes History: 0 Stroke History: 0 Vascular Disease History: 0 Age Score: 2 Gender Score: 1       Physical Exam:    VS:  BP (!) 122/58 (BP Location: Left Arm, Patient Position: Sitting, Cuff Size: Normal)   Pulse 62   Ht 5\' 3"  (1.6 m)   Wt 169 lb 6.4 oz (76.8 kg)  SpO2 93%   BMI 30.01 kg/m     Wt Readings from Last 3 Encounters:  02/16/23 169 lb 6.4 oz (76.8 kg)  02/02/23 169 lb  (76.7 kg)  09/03/22 172 lb (78 kg)     GEN:  Well nourished, well developed in no acute distress HEENT: Normal NECK: No JVD; No carotid bruits LYMPHATICS: No lymphadenopathy CARDIAC: RRR, no murmurs, rubs, gallops RESPIRATORY:  Clear to auscultation without rales, wheezing or rhonchi  ABDOMEN: Soft, non-tender, non-distended MUSCULOSKELETAL:  No edema; No deformity  SKIN: Warm and dry NEUROLOGIC:  Alert and oriented x 3 PSYCHIATRIC:  Normal affect   ASSESSMENT:    1. Shortness of breath   2. Dyspnea on exertion   3. PAF (paroxysmal atrial fibrillation) (HCC)   4. Chronic anticoagulation   5. Primary hypertension   6. Mixed hyperlipidemia   7. Pressure in head    PLAN:    In order of problems listed above:  Shortness of breath - will update an echocardiogram - if echo normal, can consider coronary CTA - if echo abnormal, may need to consider right and left heart catheterization   PAF - on metoprolol - EKG today with sinus rhythm - she is unaware of her rhythm, she checks HR in her fitbit - last bout of Afib likely 3 months ago and lasted 10-15 minutes   Chronic anticoagulation - no bleeding issues on eliquis - no bleeding problesm   Hypertension 50 mg losartan, 6.125 mg HCTZ, 50/25 mg lopressor BID BP well controlled today   Hyperlipidemia with LDL goal < 100 Lipitor 10 mg LDL 100 in 07/2022   Pressure in back of head No syncope Hold off on carotid artery duplex for now, not sure this would be high yield   Increased gas / belching Will trial OTC Pepcid    Follow up with me in 6 weeks.      Medication Adjustments/Labs and Tests Ordered: Current medicines are reviewed at length with the patient today.  Concerns regarding medicines are outlined above.  Orders Placed This Encounter  Procedures   EKG 12-Lead   ECHOCARDIOGRAM COMPLETE   No orders of the defined types were placed in this encounter.   Patient Instructions  Medication  Instructions:  No changes *If you need a refill on your cardiac medications before your next appointment, please call your pharmacy*   Lab Work: No labs If you have labs (blood work) drawn today and your tests are completely normal, you will receive your results only by: MyChart Message (if you have MyChart) OR A paper copy in the mail If you have any lab test that is abnormal or we need to change your treatment, we will call you to review the results.   Testing/Procedures: [address], [address]. Your physician has requested that you have an echocardiogram. Echocardiography is a painless test that uses sound waves to create images of your heart. It provides your doctor with information about the size and shape of your heart and how well your heart's chambers and valves are working. This procedure takes approximately one hour. There are no restrictions for this procedure. Please do NOT wear cologne, perfume, aftershave, or lotions (deodorant is allowed). Please arrive 15 minutes prior to your appointment time.    Follow-Up: At [facility], you and your health needs are our priority.  As part of our continuing mission to provide you with exceptional heart care, we have created designated Provider Care Teams.  These Care  Teams include your primary Cardiologist (physician) and Advanced Practice Providers (APPs -  Physician Assistants and Nurse Practitioners) who all work together to provide you with the care you need, when you need it.  We recommend signing up for the patient portal called "MyChart".  Sign up information is provided on this After Visit Summary.  MyChart is used to connect with patients for Virtual Visits (Telemedicine).  Patients are able to view lab/test results, encounter notes, upcoming appointments, etc.  Non-urgent messages can be sent to your provider as well.   To learn more about what you can do with MyChart, go to [email or web address].    Your  next appointment:   6 week(s)  Provider:   Micah Flesher, PA-C      Other Instructions Recommended Over The counter Pepcid.   Signed, Marcelino Duster, [place]  02/16/2023 9:33 AM    [facility]

## 2023-02-16 ENCOUNTER — Ambulatory Visit: Payer: Medicare Other | Attending: Physician Assistant | Admitting: Physician Assistant

## 2023-02-16 ENCOUNTER — Encounter: Payer: Self-pay | Admitting: Physician Assistant

## 2023-02-16 VITALS — BP 122/58 | HR 62 | Ht 63.0 in | Wt 169.4 lb

## 2023-02-16 DIAGNOSIS — E782 Mixed hyperlipidemia: Secondary | ICD-10-CM

## 2023-02-16 DIAGNOSIS — Z7901 Long term (current) use of anticoagulants: Secondary | ICD-10-CM | POA: Diagnosis not present

## 2023-02-16 DIAGNOSIS — R0602 Shortness of breath: Secondary | ICD-10-CM | POA: Diagnosis not present

## 2023-02-16 DIAGNOSIS — R0609 Other forms of dyspnea: Secondary | ICD-10-CM | POA: Diagnosis not present

## 2023-02-16 DIAGNOSIS — I480 Paroxysmal atrial fibrillation: Secondary | ICD-10-CM

## 2023-02-16 DIAGNOSIS — R519 Headache, unspecified: Secondary | ICD-10-CM | POA: Diagnosis not present

## 2023-02-16 DIAGNOSIS — I10 Essential (primary) hypertension: Secondary | ICD-10-CM

## 2023-02-16 NOTE — Patient Instructions (Addendum)
Medication Instructions:  No changes *If you need a refill on your cardiac medications before your next appointment, please call your pharmacy*   Lab Work: No labs If you have labs (blood work) drawn today and your tests are completely normal, you will receive your results only by: MyChart Message (if you have MyChart) OR A paper copy in the mail If you have any lab test that is abnormal or we need to change your treatment, we will call you to review the results.   Testing/Procedures: [address]. Your physician has requested that you have an echocardiogram. Echocardiography is a painless test that uses sound waves to create images of your heart. It provides your doctor with information about the size and shape of your heart and how well your heart's chambers and valves are working. This procedure takes approximately one hour. There are no restrictions for this procedure. Please do NOT wear cologne, perfume, aftershave, or lotions (deodorant is allowed). Please arrive 15 minutes prior to your appointment time.    Follow-Up: At [facility], you and your health needs are our priority.  As part of our continuing mission to provide you with exceptional heart care, we have created designated Provider Care Teams.  These Care Teams include your primary Cardiologist (physician) and Advanced Practice Providers (APPs -  Physician Assistants and Nurse Practitioners) who all work together to provide you with the care you need, when you need it.  We recommend signing up for the patient portal called "MyChart".  Sign up information is provided on this After Visit Summary.  MyChart is used to connect with patients for Virtual Visits (Telemedicine).  Patients are able to view lab/test results, encounter notes, upcoming appointments, etc.  Non-urgent messages can be sent to your provider as well.   To learn more about what you can do with MyChart, go to  [email or web address].    Your next appointment:   6 week(s)  Provider:   Micah Flesher, PA-C      Other Instructions Recommended Over The counter Pepcid.

## 2023-03-05 ENCOUNTER — Ambulatory Visit: Payer: Medicare Other | Admitting: Family Medicine

## 2023-03-16 DIAGNOSIS — M4724 Other spondylosis with radiculopathy, thoracic region: Secondary | ICD-10-CM | POA: Diagnosis not present

## 2023-03-16 DIAGNOSIS — M9902 Segmental and somatic dysfunction of thoracic region: Secondary | ICD-10-CM | POA: Diagnosis not present

## 2023-03-16 DIAGNOSIS — M47816 Spondylosis without myelopathy or radiculopathy, lumbar region: Secondary | ICD-10-CM | POA: Diagnosis not present

## 2023-03-16 DIAGNOSIS — M4721 Other spondylosis with radiculopathy, occipito-atlanto-axial region: Secondary | ICD-10-CM | POA: Diagnosis not present

## 2023-03-16 DIAGNOSIS — M9901 Segmental and somatic dysfunction of cervical region: Secondary | ICD-10-CM | POA: Diagnosis not present

## 2023-03-16 DIAGNOSIS — M9903 Segmental and somatic dysfunction of lumbar region: Secondary | ICD-10-CM | POA: Diagnosis not present

## 2023-03-18 ENCOUNTER — Ambulatory Visit (HOSPITAL_COMMUNITY): Payer: Medicare Other | Attending: Internal Medicine

## 2023-03-18 DIAGNOSIS — R0609 Other forms of dyspnea: Secondary | ICD-10-CM | POA: Insufficient documentation

## 2023-03-18 DIAGNOSIS — R0602 Shortness of breath: Secondary | ICD-10-CM | POA: Insufficient documentation

## 2023-03-18 LAB — ECHOCARDIOGRAM COMPLETE
Area-P 1/2: 3.15 cm2
MV M vel: 4.84 m/s
MV Peak grad: 93.7 mmHg
MV VTI: 1.18 cm2
Radius: 0.6 cm
S' Lateral: 3.5 cm

## 2023-04-05 NOTE — Progress Notes (Unsigned)
Cardiology Office Note:    Date:  04/07/2023   ID:  Rosalene C Bernardini, DOB 10/15/1940, MRN [id number]  PCP:  [name], MD   [facility] Providers Cardiologist:  Peter Swaziland, MD Cardiology APP:  Marcelino Duster, [place] { Referring MD: [name], MD   Chief Complaint  Patient presents with   Follow-up    DOE    History of Present Illness:    Kelli [name] is a 82 y.o. female with a hx of paroxysmal atrial fibrillation, hypertension, hyperlipidemia, and chronic anticoagulation.  She was admitted 2014 with demand ischemia.  Myoview was nonischemic.  Echocardiogram [facility] 2014 with preserved EF, G1 DD.  PAF controlled with metoprolol and anticoagulated with Eliquis.  She was last seen by Dr. Swaziland on/11/23 and was doing well at that time.  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  She was seen by her PCP 02/02/2023 and reported shortness of breath since November.  While traveling in [place] in [place] in November she was having trouble keeping up with her family.  She also reported a pressure in the back of her head.    BNP 151 Hb 13.7 WBC 8.9 TSH 1.59 A1c 5.8%  PCP felt she may be in heart failure and recommended an urgent appt. She was placed on my schedule.  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  Due to shortness of breath with briskly walking the dog quickly or up a hill, we opted to check a repeat echocardiogram.  Initial plan was that if her echocardiogram was largely normal, we can consider a coronary CTA.   Echocardiogram 03/17/2023 showed an LVEF 60 to 65%, mild LVH, grade 2 diastolic dysfunction, normal RV, mild LAE, mild to moderate MR, mild to moderate TR.  She presents today for follow up. She continues to have DOE when walking up hills, etc. She is euvolemic on exam. She is seeing chiropractor for neck pain. She is breaking lipitor in half, I suggested  avoiding this for stomach upset and try 10 mg every other day. She did report black stool after eating tomatoes - resolved.    Past Medical History:  Diagnosis Date   Allergy    Phreesia 07/04/2020   Arthritis    Phreesia 07/04/2020   Atrial fibrillation (HCC) 02/2013   paroxsymal; c/b Type 2 NSTEMI; CHADS2-VASc=2 (age, sex - patient had come off anti-HTN meds in past); Eliquis started;  Echocardiogram 03/10/13: EF 55-60%, grade 1 diastolic dysfunction, mild MR, mild LAE   GERD (gastroesophageal reflux disease)    Phreesia 07/04/2020   Hyperlipidemia    Hypertension    Myocardial infarction [facility])    Phreesia 07/04/2020   NSTEMI (non-ST elevated myocardial infarction) (HCC) 02/2013   demand ischemia in setting of AFib with RVR; ETT-Myoview 03/11/13: No ischemia or scar, EF 70%.    Past Surgical History:  Procedure Laterality Date   APPENDECTOMY     CATARACT EXTRACTION     CATARACT EXTRACTION, BILATERAL     EYE SURGERY     STRABISMUS SURGERY     TONSILLECTOMY     YAG LASER APPLICATION      Current Medications: Current Meds  Medication Sig   acetaminophen (TYLENOL) 500 MG tablet Take 500 mg by mouth daily as needed for pain.   azithromycin (ZITHROMAX) 250 MG tablet z-pack - take as directed for 5 days  Calcium Carb-Cholecalciferol (CALCIUM + D3) 600-200 MG-UNIT TABS Take 1 tablet by mouth daily.   Cholecalciferol (VITAMIN D) 400 UNITS capsule Take 400 Units by mouth daily.   Docusate Calcium (STOOL SOFTENER PO) Take by mouth at bedtime.   ELDERBERRY PO Take by mouth.   FIBER ADULT GUMMIES PO Take 1 Dose by mouth daily.   fish oil-omega-3 fatty acids 1000 MG capsule Take 1 g by mouth daily.   Flaxseed, Linseed, (FLAX SEED OIL PO) Take by mouth daily.   fluticasone (FLONASE) 50 MCG/ACT nasal spray Place 2 sprays into both nostrils daily.   hydrochlorothiazide (HYDRODIURIL) 12.5 MG tablet Take 1 tablet (12.5 mg total) by mouth daily.   losartan (COZAAR) 50 MG tablet Take 1  tablet (50 mg total) by mouth daily.   Magnesium 300 MG CAPS Take 1 capsule by mouth daily.   methylPREDNISolone (MEDROL) 4 MG TBPK tablet Take by mouth as directed for 6 days   metoprolol tartrate (LOPRESSOR) 50 MG tablet TAKE 1 TABLET BY MOUTH IN THE MORNING AND 1/2 TABLET IN THE AFTERNOON. NEED APPOINTMENT FOR REFILLS   Probiotic Product (PROBIOTIC DAILY PO) Take 1 tablet by mouth daily as needed.    vitamin C (ASCORBIC ACID) 500 MG tablet Take 500 mg by mouth daily.   [DISCONTINUED] apixaban (ELIQUIS) 5 MG TABS tablet Take 1 tablet (5 mg total) by mouth 2 (two) times daily.   [DISCONTINUED] atorvastatin (LIPITOR) 10 MG tablet Take 1 tablet (10 mg total) by mouth daily.     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   Drug use: No   Sexual activity: Not Currently  Other Topics Concern   Not on file  Social History Narrative   Not on file   Social Determinants of Health   Financial Resource Strain: Low Risk  (09/03/2022)   Overall Financial Resource Strain (CARDIA)    Difficulty of Paying Living Expenses: Not hard at all  Food Insecurity: No Food Insecurity (09/03/2022)   Hunger Vital Sign    Worried About Running Out of Food in the Last Year: Never true    Ran Out of Food in the Last Year: Never true  Transportation Needs: No Transportation Needs (09/03/2022)   PRAPARE - Administrator, Civil Service (Medical): No    Lack of Transportation  (Non-Medical): No  Physical Activity: Sufficiently Active (09/03/2022)   Exercise Vital Sign    Days of Exercise per Week: 7 days    Minutes of Exercise per Session: 150+ min  Stress: No Stress Concern Present (09/03/2022)   [employer] of Occupational Health - Occupational Stress Questionnaire    Feeling of Stress : Not at all  Social Connections: Moderately Integrated (09/03/2022)   Social Connection and Isolation Panel [NHANES]    Frequency of Communication with Friends and Family: More than three times a week    Frequency of Social Gatherings with Friends  and Family: Once a week    Attends Religious Services: More than 4 times per year    Active Member of Clubs or Organizations: Yes    Attends Banker Meetings: More than 4 times per year    Marital Status: Widowed  Recent Concern: Social Connections - Moderately Isolated (09/03/2022)   Social Connection and Isolation Panel [NHANES]    Frequency of Communication with Friends and Family: Once a week    Frequency of Social Gatherings with Friends and Family: Once a week    Attends Religious Services: More than 4 times per year    Active Member of [employer] or Organizations: Yes    Attends Banker Meetings: More than 4 times per year    Marital Status: Widowed     Family History: The patient's family history includes Cancer in her maternal aunt; Heart attack (age of onset: 25) in her mother.  ROS:   Please see the history of present illness.     All other systems reviewed and are negative.  EKGs/Labs/Other Studies Reviewed:    The following studies were reviewed today:  Cardiac Studies & Procedures     STRESS TESTS  NM MYOCAR MULTI W/SPECT W 03/11/2013  Narrative This examination was dictated by the cardiologist who supervised the examination.  Please see that report for details.  Identification the patient is a 82 year old with history of chest pain test to evaluate rule out  ischemia  Stress data:  The patient exercised in the Bruce protocol. Baseline EKG showed sinus rhythm 68 beats per minute baseline blood pressure 133/67  The patient exercised for 8 minutes 59 seconds to a peak heart rate of 133 which was 89% predicted maximal peak blood pressure 241/47 consistent with hypertensive response.  She complained of no chest pain EKG showed no changes to suggest ischemia  Nuclear data:  The patient was studied in 1-day rest stress protocol she was injected with 10 mCi technetium 99 labeled sestamibi at rest, 30 mCi technetium 99 labeled sestamibi at stress images were reconstructed the short, vertical, horizontal axes.  In the initial stress images there is overall normal perfusion.  In the recovery images there is mild thinning in the inferolateral wall.  On gating LVEF was calculated at greater than 70%.  Review of the raw data the inferolateral changes at rest most likely reflects soft tissue (diaphragm)  Impression:  Overall normal perfusion.  No evidence for ischemia or scar.  LVEF on gating calculated greater than 70%.   Original Report Authenticated By: Rudie Meyer, M.D.   ECHOCARDIOGRAM  ECHOCARDIOGRAM COMPLETE 03/18/2023  Narrative ECHOCARDIOGRAM REPORT    Patient Name:   SABREA SANKEY Date of Exam: 03/18/2023 Medical Rec #:  [id number]     Height:       63.0 in Accession #:    [id number]    Weight:       169.4 lb Date of Birth:  22-Apr-1941     BSA:          1.802 m Patient Age:    81 years      BP:           151/83 mmHg Patient Gender: F             HR:           63 bpm. Exam Location:  [address]  Procedure: 2D Echo, 3D Echo, Cardiac Doppler and Color Doppler  Indications:    R06.00 SOB  History:  Patient has prior history of Echocardiogram examinations, most recent 03/10/2013. Previous Myocardial Infarction and NSTEMI, Arrythmias:Atrial Fibrillation; Risk Factors:Hypertension and HLD.  Sonographer:    Clearence Ped  RCS Referring Phys: [id number] Keirstyn Aydt NICOLE Lorea Kupfer  IMPRESSIONS   1. Left ventricular ejection fraction, by estimation, is 60 to 65%. The left ventricle has normal function. The left ventricle has no regional wall motion abnormalities. There is mild left ventricular hypertrophy. Left ventricular diastolic parameters are consistent with Grade II diastolic dysfunction (pseudonormalization). 2. Right ventricular systolic function is normal. The right ventricular size is normal. There is normal pulmonary artery systolic pressure. 3. Left atrial size was moderately dilated. 4. The mitral valve is degenerative. Mild to moderate mitral valve regurgitation. 5. Tricuspid valve regurgitation is mild to moderate. 6. The aortic valve is grossly normal. Aortic valve regurgitation is not visualized. 7. The inferior vena cava is normal in size with greater than 50% respiratory variability, suggesting right atrial pressure of 3 mmHg.  FINDINGS Left Ventricle: Left ventricular ejection fraction, by estimation, is 60 to 65%. The left ventricle has normal function. The left ventricle has no regional wall motion abnormalities. The left ventricular internal cavity size was normal in size. There is mild left ventricular hypertrophy. Left ventricular diastolic parameters are consistent with Grade II diastolic dysfunction (pseudonormalization).  Right Ventricle: The right ventricular size is normal. Right ventricular systolic function is normal. There is normal pulmonary artery systolic pressure. The tricuspid regurgitant velocity is 2.70 m/s, and with an assumed right atrial pressure of 3 mmHg, the estimated right ventricular systolic pressure is 32.2 mmHg.  Left Atrium: Left atrial size was moderately dilated.  Right Atrium: Right atrial size was normal in size.  Pericardium: Trivial pericardial effusion is present.  Mitral Valve: The mitral valve is degenerative in appearance. Mild to moderate mitral valve  regurgitation. MV peak gradient, 5.9 mmHg. The mean mitral valve gradient is 1.5 mmHg.  Tricuspid Valve: The tricuspid valve is normal in structure. Tricuspid valve regurgitation is mild to moderate.  Aortic Valve: The aortic valve is grossly normal. Aortic valve regurgitation is not visualized.  Pulmonic Valve: The pulmonic valve was normal in structure. Pulmonic valve regurgitation is not visualized.  Aorta: The aortic root and ascending aorta are structurally normal, with no evidence of dilitation.  Venous: The inferior vena cava is normal in size with greater than 50% respiratory variability, suggesting right atrial pressure of 3 mmHg.  IAS/Shunts: No atrial level shunt detected by color flow Doppler.   LEFT VENTRICLE PLAX 2D LVIDd:         4.80 cm   Diastology LVIDs:         3.50 cm   LV e' medial:    10.70 cm/s LV PW:         1.10 cm   LV E/e' medial:  11.9 LV IVS:        1.00 cm   LV e' lateral:   7.40 cm/s LVOT diam:     1.60 cm   LV E/e' lateral: 17.2 LV SV:         49 LV SV Index:   27 LVOT Area:     2.01 cm   RIGHT VENTRICLE RV Basal diam:  2.50 cm RV S prime:     14.60 cm/s TAPSE (M-mode): 2.0 cm RVSP:           32.2 mmHg  LEFT ATRIUM             Index  RIGHT ATRIUM           Index LA diam:        4.20 cm 2.33 cm/m   RA Pressure: 3.00 mmHg LA Vol (A2C):   74.8 ml 41.51 ml/m  RA Area:     14.00 cm LA Vol (A4C):   70.3 ml 39.01 ml/m  RA Volume:   29.70 ml  16.48 ml/m LA Biplane Vol: 77.5 ml 43.01 ml/m AORTIC VALVE LVOT Vmax:   91.80 cm/s LVOT Vmean:  65.100 cm/s LVOT VTI:    0.242 m  AORTA Ao Root diam: 2.80 cm Ao Asc diam:  3.00 cm  MITRAL VALVE                  TRICUSPID VALVE MV Area (PHT): 3.15 cm       TR Peak grad:   29.2 mmHg MV Area VTI:   1.18 cm       TR Vmax:        270.00 cm/s MV Peak grad:  5.9 mmHg       Estimated RAP:  3.00 mmHg MV Mean grad:  1.5 mmHg       RVSP:           32.2 mmHg MV Vmax:       1.21 m/s MV Vmean:       58.2 cm/s      SHUNTS MV Decel Time: 241 msec       Systemic VTI:  0.24 m MR Peak grad:    93.7 mmHg    Systemic Diam: 1.60 cm MR Mean grad:    63.0 mmHg MR Vmax:         484.00 cm/s MR Vmean:        377.0 cm/s MR PISA:         2.26 cm MR PISA Eff ROA: 14 mm MR PISA Radius:  0.60 cm MV E velocity: 127.00 cm/s MV A velocity: 70.20 cm/s MV E/A ratio:  1.81  Photographer signed by Carolan Clines Signature Date/Time: 03/18/2023/9:47:36 AM    Final                  Recent Labs: 07/29/2022: ALT 25; BUN 22; Creatinine, Ser 1.16; Potassium 4.5; Sodium 138 02/03/2023: BNP 150.8; Hemoglobin 13.7; Platelets 281; TSH 1.590  Recent Lipid Panel    Component Value Date/Time   CHOL 168 07/29/2022 0943   TRIG 91 07/29/2022 0943   HDL 51 07/29/2022 0943   CHOLHDL 3.3 07/29/2022 0943   CHOLHDL 3.5 03/10/2013 0500   VLDL 14 03/10/2013 0500   LDLCALC 100 (H) 07/29/2022 0943     Risk Assessment/Calculations:    CHA2DS2-VASc Score = 4   This indicates a 4.8% annual risk of stroke. The patient's score is based upon: CHF History: 0 HTN History: 1 Diabetes History: 0 Stroke History: 0 Vascular Disease History: 0 Age Score: 2 Gender Score: 1            Physical Exam:    VS:  BP 124/82   Pulse 71   Ht 5\' 3"  (1.6 m)   Wt 167 lb 6.4 oz (75.9 kg)   SpO2 100%   BMI 29.65 kg/m     Wt Readings from Last 3 Encounters:  04/07/23 167 lb 6.4 oz (75.9 kg)  02/16/23 169 lb 6.4 oz (76.8 kg)  02/02/23 169 lb (76.7 kg)     GEN:  Well nourished, well developed in no acute distress HEENT: Normal NECK:  No JVD; No carotid bruits LYMPHATICS: No lymphadenopathy CARDIAC: RRR, no murmurs, rubs, gallops RESPIRATORY:  Clear to auscultation without rales, wheezing or rhonchi  ABDOMEN: Soft, non-tender, non-distended MUSCULOSKELETAL:  No edema; No deformity  SKIN: Warm and dry NEUROLOGIC:  Alert and oriented x 3 PSYCHIATRIC:  Normal affect   ASSESSMENT:    1. Atrial  fibrillation with RVR (HCC)   2. Essential hypertension   3. PAF (paroxysmal atrial fibrillation) (HCC)   4. DOE (dyspnea on exertion)   5. Chronic diastolic heart failure (HCC)   6. Valvular heart disease   7. Chronic anticoagulation    PLAN:    In order of problems listed above:  Chronic diastolic heart failure Dyspnea on exertion - echo with grade 2 DD - on hydrochlorothiazide 12.5 mg daily - appears euvolemic on exam - will check a CT coronary - instructed her to take 75 mg lopressor that morning   Valvular disease - mild to moderate MR, mild to moderate TR - plan to repeat echo in 1 year   PAF - on metoprolol - has been maintaining sinus rhythm, she is symptomatic with Afib, do not think this is contributing to her SOB   Chronic anticoagulation - no bleeding issues on eliquis - but did report 2-3 days of black stool - will check a CBC today   Hypertension 50 mg losartan, 12.5 mg HCTZ, 50/25 mg lopressor BID   Hyperlipidemia with LDL goal < 70 Lipitor 10 mg LDL was 100 on 07/2022   Increased gas / belching Trialed OTC pepsid      Follow up in 6 months.   Medication Adjustments/Labs and Tests Ordered: Current medicines are reviewed at length with the patient today.  Concerns regarding medicines are outlined above.  Orders Placed This Encounter  Procedures   CT CORONARY MORPH W/CTA COR W/SCORE W/CA W/CM &/OR WO/CM   Basic Metabolic Panel (BMET)   CBC   EKG 12-Lead   Meds ordered this encounter  Medications   atorvastatin (LIPITOR) 10 MG tablet    Sig: Take 1 tablet (10 mg total) by mouth daily.    Dispense:  90 tablet    Refill:  3   apixaban (ELIQUIS) 5 MG TABS tablet    Sig: Take 1 tablet (5 mg total) by mouth 2 (two) times daily.    Dispense:  46 tablet    Refill:  0    Order Specific Question:   Lot Number?    Answer:   [id number]    Order Specific Question:   Expiration Date?    Answer:   06/15/2024    Order Specific Question:   Quantity     Answer:   46    Comments:   4 BOXS W 14 TABS EACH    Patient Instructions  Medication Instructions:  Lipitor 10 mg daily. Please don't cut pill in half. Metoprolol 75 mg the day of CT (no rx needed)  *If you need a refill on your cardiac medications before your next appointment, please call your pharmacy*   Lab Work: BMET & CBC   Testing/Procedures:   Your cardiac CT will be scheduled at one of the below locations:   [facility] [address] [place], [place] [address] [phone]  OR  [facility] [address] [place], [place] [address] [phone]  OR   [facility] [address] [place], [place] [address] [phone]  If scheduled at [facility], please arrive at the [facility] and [facility]'s Entrance (Entrance C2) of [facility] 30 minutes prior to test start time. You can use the FREE valet parking offered at entrance C (encouraged to control the heart rate for the test)  Proceed to the [facility] Radiology Department (first floor) to check-in and test prep.  All radiology patients and guests should use entrance C2 at [facility], accessed from [facility], even though the hospital's physical address listed is [address].    If scheduled at [facility] or [facility], please arrive 15 mins early for check-in and test prep.  There is spacious parking and easy access to the radiology department from the [facility] Heart and Vascular entrance. Please enter here and check-in with the desk attendant.   Please follow these instructions carefully (unless otherwise directed):  An IV will be required for this test and Nitroglycerin will be given.  Hold all erectile dysfunction medications at least 3 days (72 hrs) prior to test. (Ie viagra, cialis, sildenafil, tadalafil, etc)   On the  Night Before the Test: Be sure to Drink plenty of water. Do not consume any caffeinated/decaffeinated beverages or chocolate 12 hours prior to your test. Do not take any antihistamines 12 hours prior to your test. If the patient has contrast allergy: Patient will need a prescription for Prednisone and very clear instructions (as follows): Prednisone 50 mg - take 13 hours prior to test Take another Prednisone 50 mg 7 hours prior to test Take another Prednisone 50 mg 1 hour prior to test Take Benadryl 50 mg 1 hour prior to test Patient must complete all four doses of above prophylactic medications. Patient will need a ride after test due to Benadryl.  On the Day of the Test: Drink plenty of water until 1 hour prior to the test. Do not eat any food 1 hour prior to test. You may take your regular medications prior to the test.  Take metoprolol (Lopressor) two hours prior to test. If you take Furosemide/Hydrochlorothiazide/Spironolactone, please HOLD on the morning of the test. FEMALES- please wear underwire-free bra if available, avoid dresses & tight clothing  *For Clinical Staff only. Please instruct patient the following:* Heart Rate Medication Recommendations for Cardiac CT  Resting HR < 50 bpm  No medication  Resting HR 50-60 bpm and BP >110/50 mmHG   Consider Metoprolol tartrate 25 mg PO 90-120 min prior to scan  Resting HR 60-65 bpm and BP >110/50 mmHG  Metoprolol tartrate 50 mg PO 90-120 minutes prior to scan   Resting HR > 65 bpm and BP >110/50 mmHG  Metoprolol tartrate 100 mg PO 90-120 minutes prior to scan  Consider Ivabradine 10-15 mg PO or a calcium channel blocker for resting HR >60 bpm and contraindication to metoprolol tartrate  Consider Ivabradine 10-15 mg PO in combination with metoprolol tartrate for HR >80 bpm        After the Test: Drink plenty of water. After receiving IV contrast, you may experience a mild flushed feeling. This is normal. On occasion, you may  experience a mild rash up to 24 hours after the test. This is not dangerous. If this occurs, you can take Benadryl 25 mg and increase your fluid intake. If you experience trouble breathing, this can be serious. If it is severe call 911 IMMEDIATELY. If it is mild, please call our office. If you take any of these medications: Glipizide/Metformin, Avandament, Glucavance, please do  not take 48 hours after completing test unless otherwise instructed.  We will call to schedule your test 2-4 weeks out understanding that some insurance companies will need an authorization prior to the service being performed.   For more information and frequently asked questions, please visit our website : [email or web address]  For non-scheduling related questions, please contact the cardiac imaging nurse navigator should you have any questions/concerns: Cardiac Imaging Nurse Navigators Direct Office Dial: [phone]   For scheduling needs, including cancellations and rescheduling, please call [place], [phone].    Follow-Up: At [facility], you and your health needs are our priority.  As part of our continuing mission to provide you with exceptional heart care, we have created designated Provider Care Teams.  These Care Teams include your primary Cardiologist (physician) and Advanced Practice Providers (APPs -  Physician Assistants and Nurse Practitioners) who all work together to provide you with the care you need, when you need it.  We recommend signing up for the patient portal called "MyChart".  Sign up information is provided on this After Visit Summary.  MyChart is used to connect with patients for Virtual Visits (Telemedicine).  Patients are able to view lab/test results, encounter notes, upcoming appointments, etc.  Non-urgent messages can be sent to your provider as well.   To learn more about what you can do with MyChart, go to [email or web address].    Your next appointment:    6 month(s)  Provider:   Peter Swaziland, MD  or Micah Flesher, PA-C        Other Instructions    Signed, Roe Rutherford Jonnie [place], [place]  04/07/2023 12:12 PM    [facility]

## 2023-04-07 ENCOUNTER — Encounter: Payer: Self-pay | Admitting: Physician Assistant

## 2023-04-07 ENCOUNTER — Ambulatory Visit: Payer: Medicare Other | Attending: Physician Assistant | Admitting: Physician Assistant

## 2023-04-07 VITALS — BP 124/82 | HR 71 | Ht 63.0 in | Wt 167.4 lb

## 2023-04-07 DIAGNOSIS — I10 Essential (primary) hypertension: Secondary | ICD-10-CM

## 2023-04-07 DIAGNOSIS — R0609 Other forms of dyspnea: Secondary | ICD-10-CM | POA: Diagnosis not present

## 2023-04-07 DIAGNOSIS — I5032 Chronic diastolic (congestive) heart failure: Secondary | ICD-10-CM

## 2023-04-07 DIAGNOSIS — I38 Endocarditis, valve unspecified: Secondary | ICD-10-CM

## 2023-04-07 DIAGNOSIS — Z7901 Long term (current) use of anticoagulants: Secondary | ICD-10-CM

## 2023-04-07 DIAGNOSIS — I2510 Atherosclerotic heart disease of native coronary artery without angina pectoris: Secondary | ICD-10-CM | POA: Diagnosis not present

## 2023-04-07 DIAGNOSIS — I4891 Unspecified atrial fibrillation: Secondary | ICD-10-CM

## 2023-04-07 DIAGNOSIS — I480 Paroxysmal atrial fibrillation: Secondary | ICD-10-CM

## 2023-04-07 MED ORDER — ATORVASTATIN CALCIUM 10 MG PO TABS
10.0000 mg | ORAL_TABLET | Freq: Every day | ORAL | 3 refills | Status: DC
Start: 2023-04-07 — End: 2023-04-07

## 2023-04-07 MED ORDER — APIXABAN 5 MG PO TABS: 5.0000 mg | ORAL_TABLET | Freq: Two times a day (BID) | ORAL | 0 refills | Status: DC

## 2023-04-07 NOTE — Addendum Note (Signed)
Addended by: Lamar Benes on: 04/07/2023 04:10 PM   Modules accepted: Orders

## 2023-04-07 NOTE — Patient Instructions (Addendum)
Medication Instructions:  Lipitor 10 mg every other day. Please don't cut pill in half. Metoprolol 75 mg the day of CT (no rx needed)  *If you need a refill on your cardiac medications before your next appointment, please call your pharmacy*   Lab Work: BMET & CBC   Testing/Procedures:   Your cardiac CT will be scheduled at one of the below locations:   [facility] [address] [place], [place] [address] [phone]  OR  [facility] [address] [place], [place] [address] [phone]  OR   [facility] [address] [place], [place] [address] [phone]  If scheduled at [facility], please arrive at the [facility] and Children's Entrance (Entrance C2) of [facility] 30 minutes prior to test start time. You can use the FREE valet parking offered at entrance C (encouraged to control the heart rate for the test)  Proceed to the [facility] Radiology Department (first floor) to check-in and test prep.  All radiology patients and guests should use entrance C2 at [facility], accessed from [facility], even though the hospital's physical address listed is [address].    If scheduled at [facility] or [facility], please arrive 15 mins early for check-in and test prep.  There is spacious parking and easy access to the radiology department from the [facility] Heart and Vascular entrance. Please enter here and check-in with the desk attendant.   Please follow these instructions carefully (unless otherwise directed):  An IV will be required for this test and Nitroglycerin will be given.  Hold all erectile dysfunction medications at least 3 days (72 hrs) prior to test. (Ie viagra, cialis, sildenafil, tadalafil, etc)   On the Night Before the Test: Be sure to Drink plenty of water. Do not consume any  caffeinated/decaffeinated beverages or chocolate 12 hours prior to your test. Do not take any antihistamines 12 hours prior to your test. If the patient has contrast allergy: Patient will need a prescription for Prednisone and very clear instructions (as follows): Prednisone 50 mg - take 13 hours prior to test Take another Prednisone 50 mg 7 hours prior to test Take another Prednisone 50 mg 1 hour prior to test Take Benadryl 50 mg 1 hour prior to test Patient must complete all four doses of above prophylactic medications. Patient will need a ride after test due to Benadryl.  On the Day of the Test: Drink plenty of water until 1 hour prior to the test. Do not eat any food 1 hour prior to test. You may take your regular medications prior to the test.  Take metoprolol (Lopressor) two hours prior to test. If you take Furosemide/Hydrochlorothiazide/Spironolactone, please HOLD on the morning of the test. FEMALES- please wear underwire-free bra if available, avoid dresses & tight clothing  *For Clinical Staff only. Please instruct patient the following:* Heart Rate Medication Recommendations for Cardiac CT  Resting HR < 50 bpm  No medication  Resting HR 50-60 bpm and BP >110/50 mmHG   Consider Metoprolol tartrate 25 mg PO 90-120 min prior to scan  Resting HR 60-65 bpm and BP >110/50 mmHG  Metoprolol tartrate 50 mg PO 90-120 minutes prior to scan   Resting HR > 65 bpm and BP >110/50 mmHG  Metoprolol tartrate 100 mg PO 90-120 minutes prior to scan  Consider Ivabradine 10-15 mg PO or a calcium channel blocker for  resting HR >60 bpm and contraindication to metoprolol tartrate  Consider Ivabradine 10-15 mg PO in combination with metoprolol tartrate for HR >80 bpm        After the Test: Drink plenty of water. After receiving IV contrast, you may experience a mild flushed feeling. This is normal. On occasion, you may experience a mild rash up to 24 hours after the test. This is not dangerous. If  this occurs, you can take Benadryl 25 mg and increase your fluid intake. If you experience trouble breathing, this can be serious. If it is severe call 911 IMMEDIATELY. If it is mild, please call our office. If you take any of these medications: Glipizide/Metformin, Avandament, Glucavance, please do not take 48 hours after completing test unless otherwise instructed.  We will call to schedule your test 2-4 weeks out understanding that some insurance companies will need an authorization prior to the service being performed.   For more information and frequently asked questions, please visit our website : [email or web address]  For non-scheduling related questions, please contact the cardiac imaging nurse navigator should you have any questions/concerns: Cardiac Imaging Nurse Navigators Direct Office Dial: [phone]   For scheduling needs, including cancellations and rescheduling, please call [place], [phone].    Follow-Up: At [facility], you and your health needs are our priority.  As part of our continuing mission to provide you with exceptional heart care, we have created designated Provider Care Teams.  These Care Teams include your primary Cardiologist (physician) and Advanced Practice Providers (APPs -  Physician Assistants and Nurse Practitioners) who all work together to provide you with the care you need, when you need it.  We recommend signing up for the patient portal called "MyChart".  Sign up information is provided on this After Visit Summary.  MyChart is used to connect with patients for Virtual Visits (Telemedicine).  Patients are able to view lab/test results, encounter notes, upcoming appointments, etc.  Non-urgent messages can be sent to your provider as well.   To learn more about what you can do with MyChart, go to [email or web address].    Your next appointment:   6 month(s)  Provider:   Peter Swaziland, MD  or Micah Flesher, PA-C         Other Instructions

## 2023-04-08 ENCOUNTER — Encounter: Payer: Self-pay | Admitting: Family Medicine

## 2023-04-08 ENCOUNTER — Ambulatory Visit (INDEPENDENT_AMBULATORY_CARE_PROVIDER_SITE_OTHER): Payer: Medicare Other | Admitting: Family Medicine

## 2023-04-08 VITALS — BP 122/75 | HR 78 | Ht 63.0 in | Wt 167.1 lb

## 2023-04-08 DIAGNOSIS — G4489 Other headache syndrome: Secondary | ICD-10-CM

## 2023-04-08 DIAGNOSIS — R0609 Other forms of dyspnea: Secondary | ICD-10-CM

## 2023-04-08 DIAGNOSIS — N1831 Chronic kidney disease, stage 3a: Secondary | ICD-10-CM

## 2023-04-08 DIAGNOSIS — N183 Chronic kidney disease, stage 3 unspecified: Secondary | ICD-10-CM | POA: Insufficient documentation

## 2023-04-08 LAB — CBC
Hematocrit: 38.5 % (ref 34.0–46.6)
Hemoglobin: 12.5 g/dL (ref 11.1–15.9)
MCH: 29.6 pg (ref 26.6–33.0)
MCHC: 32.5 g/dL (ref 31.5–35.7)
MCV: 91 fL (ref 79–97)
Platelets: 241 10*3/uL (ref 150–450)
RBC: 4.22 x10E6/uL (ref 3.77–5.28)
RDW: 12.8 % (ref 11.7–15.4)
WBC: 8 10*3/uL (ref 3.4–10.8)

## 2023-04-08 LAB — BASIC METABOLIC PANEL
BUN/Creatinine Ratio: 17 (ref 12–28)
BUN: 19 mg/dL (ref 8–27)
CO2: 26 mmol/L (ref 20–29)
Calcium: 9.5 mg/dL (ref 8.7–10.3)
Chloride: 102 mmol/L (ref 96–106)
Creatinine, Ser: 1.09 mg/dL — ABNORMAL HIGH (ref 0.57–1.00)
Glucose: 90 mg/dL (ref 70–99)
Potassium: 5.2 mmol/L (ref 3.5–5.2)
Sodium: 139 mmol/L (ref 134–144)
eGFR: 51 mL/min/{1.73_m2} — ABNORMAL LOW (ref >59)

## 2023-04-08 NOTE — Progress Notes (Signed)
   Established Patient Office Visit  Subjective   Patient ID: Kelli [name], female    DOB: 08-11-1941  Age: 82 y.o. MRN: [id number]  Chief Complaint  Patient presents with   Medical Management of Chronic Issues    HPI  Sob -patient saw her cardiologist yesterday.  At that appointment she went over her echo results and patient is going to get a coronary artery CT.  I discussed with the patient possibility of doing pulmonary function testing here.  Patient agreeable to this.  Discussed with her the interpretation of her echo and blood testing.  Discussed the difference between diastolic and systolic dysfunction.  Patient still getting short of breath mostly with exertion.  Patient still complains of a "pressure" at the back of her head near the base.  This happens when she exerts herself.  It is mild.  Happens on most days some point.  Has had a history of vertebral cervical fusion.  No other symptoms.     ROS    Objective:     BP 122/75   Pulse 78   Ht 5\' 3"  (1.6 m)   Wt 167 lb 1.9 oz (75.8 kg)   SpO2 99%   BMI 29.60 kg/m    Physical Exam General: Alert, oriented Pulmonary: No respiratory distress.  No tachypnea Psych: Pleasant affect   No results found for any visits on 04/08/23.        Assessment & Plan:   Stage 3a chronic kidney disease (HCC)  Other headache syndrome Assessment & Plan: Given the patient's age and new onset posterior headache that is persistent, workup warrants imaging with MRI as recommended by [employer] Academy of neurology and [employer] of radiology. - MRI ordered   Orders: -     MR BRAIN WO CONTRAST; Future  Dyspnea on exertion Assessment & Plan: Patient has worsening/new dyspnea on exertion with a elevated BNP (in setting of mild CKD, not obese) and recent echo that shows G2 DD and mild LVH.  I would consider this to be new onset HFpEF in the absence of other causes.  Will have patient come back to obtain PFTs to rule out  other pulmonary causes of shortness of breath.  Patient also getting coronary artery calcium imaging.  Will follow-up on that as well.  Patient has followed up with the cardiologist.      Return in about 4 weeks (around 05/06/2023) for PFT.    Kelli Kitty, MD

## 2023-04-08 NOTE — Patient Instructions (Signed)
It was nice to see you today,  We addressed the following topics today: - we discussed your lab results.   - I will call you to discuss head imaging for your headaches after I have researched it further.  - I would like you to come back at your earliest convenience for pulmonary function testing.    Have a great day,  Frederic Jericho, MD

## 2023-04-13 NOTE — Assessment & Plan Note (Signed)
Given the patient's age and new onset posterior headache that is persistent, workup warrants imaging with MRI as recommended by [employer] Academy of neurology and [employer] of radiology. - MRI ordered

## 2023-04-13 NOTE — Assessment & Plan Note (Signed)
Patient has worsening/new dyspnea on exertion with a elevated BNP (in setting of mild CKD, not obese) and recent echo that shows G2 DD and mild LVH.  I would consider this to be new onset HFpEF in the absence of other causes.  Will have patient come back to obtain PFTs to rule out other pulmonary causes of shortness of breath.  Patient also getting coronary artery calcium imaging.  Will follow-up on that as well.  Patient has followed up with the cardiologist.

## 2023-04-17 ENCOUNTER — Encounter (HOSPITAL_COMMUNITY): Payer: Self-pay

## 2023-04-20 ENCOUNTER — Ambulatory Visit (HOSPITAL_COMMUNITY)
Admission: RE | Admit: 2023-04-20 | Discharge: 2023-04-20 | Disposition: A | Discharge status: Home / Self Care | Payer: Medicare Other | Source: Ambulatory Visit | Attending: Physician Assistant | Admitting: Physician Assistant

## 2023-04-20 DIAGNOSIS — I700 Atherosclerosis of aorta: Secondary | ICD-10-CM | POA: Diagnosis not present

## 2023-04-20 DIAGNOSIS — R0609 Other forms of dyspnea: Secondary | ICD-10-CM | POA: Diagnosis not present

## 2023-04-20 DIAGNOSIS — I480 Paroxysmal atrial fibrillation: Secondary | ICD-10-CM | POA: Insufficient documentation

## 2023-04-20 MED ORDER — IOHEXOL 350 MG/ML SOLN
95.0000 mL | Freq: Once | INTRAVENOUS | Status: AC | PRN
  Administered 2023-04-20: 95 mL via INTRAVENOUS

## 2023-04-20 MED ORDER — NITROGLYCERIN 0.4 MG SL SUBL
SUBLINGUAL_TABLET | SUBLINGUAL | Status: AC
  Filled 2023-04-20: qty 2

## 2023-04-20 MED ORDER — NITROGLYCERIN 0.4 MG SL SUBL
0.8000 mg | SUBLINGUAL_TABLET | Freq: Once | SUBLINGUAL | Status: AC
  Administered 2023-04-20: 0.8 mg via SUBLINGUAL

## 2023-04-24 MED ORDER — ATORVASTATIN CALCIUM 20 MG PO TABS: 20.0000 mg | ORAL_TABLET | Freq: Every day | ORAL | 3 refills | Status: DC

## 2023-04-24 NOTE — Addendum Note (Signed)
Addended by: Duane Boston on: 04/24/2023 01:47 PM   Modules accepted: Orders

## 2023-04-26 ENCOUNTER — Ambulatory Visit
Admission: RE | Admit: 2023-04-26 | Discharge: 2023-04-26 | Disposition: A | Discharge status: Home / Self Care | Payer: Medicare Other | Source: Ambulatory Visit | Attending: Family Medicine | Admitting: Family Medicine

## 2023-04-26 DIAGNOSIS — G4489 Other headache syndrome: Secondary | ICD-10-CM | POA: Insufficient documentation

## 2023-04-26 DIAGNOSIS — I6782 Cerebral ischemia: Secondary | ICD-10-CM | POA: Diagnosis not present

## 2023-04-27 ENCOUNTER — Telehealth: Payer: Self-pay | Admitting: Cardiology

## 2023-04-27 DIAGNOSIS — R911 Solitary pulmonary nodule: Secondary | ICD-10-CM

## 2023-04-27 NOTE — Telephone Encounter (Signed)
Talked with pt and she had her CT done last Monday she reported. [facility] Radiology is wanting to make sure doctor sees addendum results. Will forward this to Dr. Swaziland and his nurse.

## 2023-04-27 NOTE — Telephone Encounter (Signed)
Diane from [facility] is calling in regards to the patient's Ct results. Diane would like to make sure our providers see the addendum repost.

## 2023-04-27 NOTE — Telephone Encounter (Signed)
Called pt LVM to have her call back to our number.

## 2023-04-27 NOTE — Telephone Encounter (Signed)
Left voicemail to return call to office.

## 2023-04-28 ENCOUNTER — Other Ambulatory Visit: Payer: Self-pay

## 2023-04-28 ENCOUNTER — Telehealth: Payer: Self-pay | Admitting: Cardiology

## 2023-04-28 NOTE — Telephone Encounter (Signed)
Spoke to patient Dr.Jordan reviewed coronary ct.He advised no acute findings.Right lung nodule.He recommends follow up chest ct in 6 months.Advised lung nodule will not cause shortness of breath.

## 2023-04-28 NOTE — Addendum Note (Signed)
Addended by: Neoma Laming on: 04/28/2023 02:23 PM   Modules accepted: Orders

## 2023-04-28 NOTE — Telephone Encounter (Signed)
Provider has seen addendum  Call to patient and ensured she had updated results.

## 2023-04-28 NOTE — Telephone Encounter (Signed)
Patient called wanting to know if the nodule in her lung would cause her to be SOB. Please advise.

## 2023-04-28 NOTE — Telephone Encounter (Signed)
Discussed results with patient this morning.  She is now calling back to see if the lung nodule is the cause of her SOB.  Please advise

## 2023-04-28 NOTE — Telephone Encounter (Signed)
LVM to call office.

## 2023-04-29 ENCOUNTER — Ambulatory Visit: Payer: Medicare Other

## 2023-04-29 DIAGNOSIS — M9901 Segmental and somatic dysfunction of cervical region: Secondary | ICD-10-CM | POA: Diagnosis not present

## 2023-04-29 DIAGNOSIS — M9904 Segmental and somatic dysfunction of sacral region: Secondary | ICD-10-CM | POA: Diagnosis not present

## 2023-04-29 DIAGNOSIS — M9902 Segmental and somatic dysfunction of thoracic region: Secondary | ICD-10-CM | POA: Diagnosis not present

## 2023-04-29 DIAGNOSIS — M5388 Other specified dorsopathies, sacral and sacrococcygeal region: Secondary | ICD-10-CM | POA: Diagnosis not present

## 2023-04-29 DIAGNOSIS — M4721 Other spondylosis with radiculopathy, occipito-atlanto-axial region: Secondary | ICD-10-CM | POA: Diagnosis not present

## 2023-04-29 DIAGNOSIS — M47814 Spondylosis without myelopathy or radiculopathy, thoracic region: Secondary | ICD-10-CM | POA: Diagnosis not present

## 2023-04-29 NOTE — Telephone Encounter (Signed)
Pt returning nurses call from earlier. Please advise 

## 2023-04-29 NOTE — Telephone Encounter (Signed)
Called pt Kelli [name] to call the office  

## 2023-04-29 NOTE — Telephone Encounter (Signed)
She is aware that nodule found should not be causing SOB. Currently not experiencing any SOB. Patient states she will follow up as recommended.

## 2023-04-29 NOTE — Addendum Note (Signed)
Addended by: Neoma Laming on: 04/29/2023 03:15 PM   Modules accepted: Orders

## 2023-05-12 ENCOUNTER — Encounter: Payer: Self-pay | Admitting: Family Medicine

## 2023-05-12 ENCOUNTER — Ambulatory Visit (INDEPENDENT_AMBULATORY_CARE_PROVIDER_SITE_OTHER): Payer: Medicare Other | Admitting: Family Medicine

## 2023-05-12 VITALS — BP 119/74 | HR 57 | Ht 63.0 in | Wt 165.1 lb

## 2023-05-12 DIAGNOSIS — R0609 Other forms of dyspnea: Secondary | ICD-10-CM | POA: Diagnosis not present

## 2023-05-12 DIAGNOSIS — R911 Solitary pulmonary nodule: Secondary | ICD-10-CM

## 2023-05-12 DIAGNOSIS — K2100 Gastro-esophageal reflux disease with esophagitis, without bleeding: Secondary | ICD-10-CM | POA: Diagnosis not present

## 2023-05-12 DIAGNOSIS — E782 Mixed hyperlipidemia: Secondary | ICD-10-CM

## 2023-05-12 MED ORDER — FAMOTIDINE 20 MG PO TABS: 20.0000 mg | ORAL_TABLET | Freq: Every day | ORAL | 1 refills | Status: AC

## 2023-05-12 NOTE — Assessment & Plan Note (Signed)
Increased from 5 mg to 10 mg last week of Crestor.  Will follow back up in approximately 6 weeks and recheck.  Advised patient that if she does not tolerate 10 mg daily she can try 10 mg every other day.  If tolerance is still an issue we discussed switching to an alternative.

## 2023-05-12 NOTE — Progress Notes (Unsigned)
   Established Patient Office Visit  Subjective   Patient ID: Kelli [name], female    DOB: [birth year]  Age: 82 y.o. MRN: [id number]  Chief Complaint  Patient presents with   Medical Management of Chronic Issues    HPI  Patient is accompanied by her daughter today.  We discussed her various imaging findings recently including her MRI of her brain, the findings of chronic microvascular ischemia.  We also discussed his TTE results including the lung nodule (patient already has a follow-up CT scan ordered), the possible presence of esophagitis, and her coronary artery calcium scores.  Patient is currently increased her Crestor from 5 to 10 mg.  When increasing from 5 to 10 mg she noticed that her joints were hurting more.  States her cardiologist wanted her to increase it from 10-20 but she does not feel like she can tolerate it at this time.  Patient occasionally has reflux symptoms and has issues with "belching".  She does not take any antacids.  We discussed possible referral to gastroenterology to see if she would benefit from an endoscopy.  Patient willing to try antacid such as Pepcid in the meantime.  The ASCVD Risk score (Arnett DK, et al., 2019) failed to calculate for the following reasons:   The 2019 ASCVD risk score is only valid for ages 2 to 45   The patient has a prior MI or stroke diagnosis  Health Maintenance Due  Topic Date Due   Zoster Vaccines- Shingrix (1 of 2) Never done   Pneumonia Vaccine 6+ Years old (2 of 2 - PCV) 06/12/2010   COVID-19 Vaccine (3 - Moderna risk series) 12/06/2019   DTaP/Tdap/Td (2 - Td or Tdap) 07/15/2021   INFLUENZA VACCINE  04/16/2023      Objective:     BP 119/74   Pulse (!) 57   Ht 5\' 3"  (1.6 m)   Wt 165 lb 1.9 oz (74.9 kg)   SpO2 100%   BMI 29.25 kg/m  {Vitals History (Optional):23777}  Physical Exam General: Alert, oriented Pulmonary: No respiratory stress Psych: Pleasant affect.  No results found for any visits on  05/12/23.      Assessment & Plan:   Mixed hyperlipidemia Assessment & Plan: Increased from 5 mg to 10 mg last week of Crestor.  Will follow back up in approximately 6 weeks and recheck.  Advised patient that if she does not tolerate 10 mg daily she can try 10 mg every other day.  If tolerance is still an issue we discussed switching to an alternative.   Gastroesophageal reflux disease with esophagitis, unspecified whether hemorrhage Assessment & Plan: Evidence of distal esophagitis on her CT scan.  Patient does not take any antacids.  Symptoms include belching mainly - Referral to gastroenterology for possible endoscopy - Starting Pepcid 20 mg daily.   Dyspnea on exertion Assessment & Plan: Our spirometer is currently not working.  Will refer for pulmonary function testing.   Lung nodule Assessment & Plan: Incidental finding on her coronary artery calcium CT scan.  Has follow-up CT scheduled in February.  She is a non-smoker.   Other orders -     Famotidine; Take 1 tablet (20 mg total) by mouth daily.  Dispense: 30 tablet; Refill: 1     Return in about 4 weeks (around 06/09/2023) for Cholesterol.    [name], MD

## 2023-05-12 NOTE — Patient Instructions (Signed)
It was nice to see you today,  We addressed the following topics today: Take your cholesterol medicine daily.  If you do not tolerate it taking it daily, let us know and you can try taking it every other day at 10 mg.  I would like to recheck your cholesterol level in approximately 1 month - I would like to refer you to the gastroenterologist to see if you are candidate for having a endoscopy to evaluate your esophagitis. - I would like you to take Pepcid daily.  I will send in a prescription but is also available over-the-counter - I will send in an order for a pulmonary function test.  Someone will call you to schedule this.  Have a great day,  Frederic Jericho, MD

## 2023-05-12 NOTE — Assessment & Plan Note (Signed)
Incidental finding on her coronary artery calcium CT scan.  Has follow-up CT scheduled in February.  She is a non-smoker.

## 2023-05-12 NOTE — Assessment & Plan Note (Signed)
Our spirometer is currently not working.  Will refer for pulmonary function testing.

## 2023-05-12 NOTE — Assessment & Plan Note (Signed)
Evidence of distal esophagitis on her CT scan.  Patient does not take any antacids.  Symptoms include belching mainly - Referral to gastroenterology for possible endoscopy - Starting Pepcid 20 mg daily.

## 2023-05-20 DIAGNOSIS — M9901 Segmental and somatic dysfunction of cervical region: Secondary | ICD-10-CM | POA: Diagnosis not present

## 2023-05-20 DIAGNOSIS — M9904 Segmental and somatic dysfunction of sacral region: Secondary | ICD-10-CM | POA: Diagnosis not present

## 2023-05-20 DIAGNOSIS — M47814 Spondylosis without myelopathy or radiculopathy, thoracic region: Secondary | ICD-10-CM | POA: Diagnosis not present

## 2023-05-20 DIAGNOSIS — M9902 Segmental and somatic dysfunction of thoracic region: Secondary | ICD-10-CM | POA: Diagnosis not present

## 2023-05-20 DIAGNOSIS — M4721 Other spondylosis with radiculopathy, occipito-atlanto-axial region: Secondary | ICD-10-CM | POA: Diagnosis not present

## 2023-05-20 DIAGNOSIS — M5388 Other specified dorsopathies, sacral and sacrococcygeal region: Secondary | ICD-10-CM | POA: Diagnosis not present

## 2023-06-08 ENCOUNTER — Other Ambulatory Visit: Payer: Self-pay | Admitting: Family Medicine

## 2023-06-08 DIAGNOSIS — E782 Mixed hyperlipidemia: Secondary | ICD-10-CM

## 2023-06-10 ENCOUNTER — Encounter: Payer: Self-pay | Admitting: Gastroenterology

## 2023-06-12 ENCOUNTER — Other Ambulatory Visit: Payer: Medicare Other

## 2023-06-12 DIAGNOSIS — E782 Mixed hyperlipidemia: Secondary | ICD-10-CM

## 2023-06-13 LAB — LIPID PANEL
Chol/HDL Ratio: 3.2 {ratio} (ref 0.0–4.4)
Cholesterol, Total: 146 mg/dL (ref 100–199)
HDL: 46 mg/dL (ref >39)
LDL Chol Calc (NIH): 83 mg/dL (ref 0–99)
Triglycerides: 91 mg/dL (ref 0–149)
VLDL Cholesterol Cal: 17 mg/dL (ref 5–40)

## 2023-06-17 DIAGNOSIS — M9901 Segmental and somatic dysfunction of cervical region: Secondary | ICD-10-CM | POA: Diagnosis not present

## 2023-06-17 DIAGNOSIS — M5137 Other intervertebral disc degeneration, lumbosacral region with discogenic back pain only: Secondary | ICD-10-CM | POA: Diagnosis not present

## 2023-06-17 DIAGNOSIS — M9902 Segmental and somatic dysfunction of thoracic region: Secondary | ICD-10-CM | POA: Diagnosis not present

## 2023-06-17 DIAGNOSIS — M47812 Spondylosis without myelopathy or radiculopathy, cervical region: Secondary | ICD-10-CM | POA: Diagnosis not present

## 2023-06-17 DIAGNOSIS — M9903 Segmental and somatic dysfunction of lumbar region: Secondary | ICD-10-CM | POA: Diagnosis not present

## 2023-06-17 DIAGNOSIS — M47894 Other spondylosis, thoracic region: Secondary | ICD-10-CM | POA: Diagnosis not present

## 2023-06-30 ENCOUNTER — Other Ambulatory Visit: Payer: Self-pay | Admitting: Cardiology

## 2023-06-30 DIAGNOSIS — I4891 Unspecified atrial fibrillation: Secondary | ICD-10-CM

## 2023-06-30 NOTE — Telephone Encounter (Signed)
Eliquis 5mg  refill request received. Patient is 82 years old, weight-74.9kg, Crea-1.09 on 04/07/23, Diagnosis-Afib, and last seen by Micah Flesher on 04/07/23. Dose is appropriate based on dosing criteria. Will send in refill to requested pharmacy.

## 2023-07-01 ENCOUNTER — Ambulatory Visit: Payer: Medicare Other | Admitting: Podiatry

## 2023-07-01 ENCOUNTER — Encounter: Payer: Self-pay | Admitting: Podiatry

## 2023-07-01 VITALS — Ht 63.0 in | Wt 165.0 lb

## 2023-07-01 DIAGNOSIS — Q828 Other specified congenital malformations of skin: Secondary | ICD-10-CM

## 2023-07-01 NOTE — Progress Notes (Signed)
Subjective:  Patient ID: Kelli [name], female    DOB: 04/27/1941,  MRN: [id number]  Chief Complaint  Patient presents with   Foot Pain    NEW PT -  painful spot on the right foot 5th toe side area states very tender    82 y.o. female presents with the above complaint.  Patient presents with right submetatarsal 5 porokeratotic lesion painful to touch is progressive gotten worse worse with ambulation worse with pressure patient would like for me to debride down she denies any other acute joints complaint.  She states that hurts with hard surface.  Has not seen and was prior to seeing me.   Review of Systems: Negative except as noted in the HPI. Denies N/V/F/Ch.  Past Medical History:  Diagnosis Date   Allergy    Phreesia 07/04/2020   Arthritis    Phreesia 07/04/2020   Atrial fibrillation (HCC) 02/2013   paroxsymal; c/b Type 2 NSTEMI; CHADS2-VASc=2 (age, sex - patient had come off anti-HTN meds in past); Eliquis started;  Echocardiogram 03/10/13: EF 55-60%, grade 1 diastolic dysfunction, mild MR, mild LAE   CKD (chronic kidney disease)    3a   GERD (gastroesophageal reflux disease)    Phreesia 07/04/2020   Hyperlipidemia    Hypertension    Myocardial infarction [facility])    Phreesia 07/04/2020   NSTEMI (non-ST elevated myocardial infarction) (HCC) 02/2013   demand ischemia in setting of AFib with RVR; ETT-Myoview 03/11/13: No ischemia or scar, EF 70%.   PAF (paroxysmal atrial fibrillation) (HCC)     Current Outpatient Medications:    acetaminophen (TYLENOL) 500 MG tablet, Take 500 mg by mouth daily as needed for pain., Disp: , Rfl:    apixaban (ELIQUIS) 5 MG TABS tablet, TAKE 1 TABLET BY MOUTH 2 TIMES DAILY., Disp: 60 tablet, Rfl: 5   atorvastatin (LIPITOR) 20 MG tablet, Take 1 tablet (20 mg total) by mouth daily. (Patient taking differently: Take 20 mg by mouth daily. Pt taking 10 mg a day), Disp: 90 tablet, Rfl: 3   azithromycin (ZITHROMAX) 250 MG tablet, z-pack - take as directed  for 5 days, Disp: 6 tablet, Rfl: 0   Calcium Carb-Cholecalciferol (CALCIUM + D3) 600-200 MG-UNIT TABS, Take 1 tablet by mouth daily., Disp: , Rfl:    Cholecalciferol (VITAMIN D) 400 UNITS capsule, Take 400 Units by mouth daily., Disp: , Rfl:    Docusate Calcium (STOOL SOFTENER PO), Take by mouth at bedtime., Disp: , Rfl:    ELDERBERRY PO, Take by mouth., Disp: , Rfl:    famotidine (PEPCID) 20 MG tablet, Take 1 tablet (20 mg total) by mouth daily., Disp: 30 tablet, Rfl: 1   fish oil-omega-3 fatty acids 1000 MG capsule, Take 1 g by mouth daily., Disp: , Rfl:    Flaxseed, Linseed, (FLAX SEED OIL PO), Take by mouth daily., Disp: , Rfl:    fluticasone (FLONASE) 50 MCG/ACT nasal spray, Place 2 sprays into both nostrils daily., Disp: 16 g, Rfl: 6   hydrochlorothiazide (HYDRODIURIL) 12.5 MG tablet, Take 1 tablet (12.5 mg total) by mouth daily., Disp: 90 tablet, Rfl: 3   losartan (COZAAR) 50 MG tablet, Take 1 tablet (50 mg total) by mouth daily., Disp: 90 tablet, Rfl: 3   Magnesium 300 MG CAPS, Take 1 capsule by mouth daily., Disp: , Rfl:    methylPREDNISolone (MEDROL) 4 MG TBPK tablet, Take by mouth as directed for 6 days, Disp: 21 tablet, Rfl: 0   metoprolol tartrate (LOPRESSOR) 50 MG tablet, TAKE 1  TABLET BY MOUTH IN THE MORNING AND 1/2 TABLET IN THE AFTERNOON. NEED APPOINTMENT FOR REFILLS, Disp: 135 tablet, Rfl: 2   Probiotic Product (PROBIOTIC DAILY PO), Take 1 tablet by mouth daily as needed. , Disp: , Rfl:    vitamin C (ASCORBIC ACID) 500 MG tablet, Take 500 mg by mouth daily., Disp: , Rfl:    methylcellulose (CITRUCEL) oral powder, Use as directed daily, Disp: , Rfl:    polyethylene glycol (MIRALAX) 17 g packet, Take 17 g by mouth daily as needed., Disp: , Rfl:   Social History   Tobacco Use  Smoking Status Never  Smokeless Tobacco Never    Allergies  Allergen Reactions   Amoxicillin Rash   Augmentin [Amoxicillin-Pot Clavulanate] Itching and Rash   Objective:  There were no vitals  filed for this visit. Body mass index is 29.23 kg/m. Constitutional Well developed. Well nourished.  Vascular Dorsalis pedis pulses palpable bilaterally. Posterior tibial pulses palpable bilaterally. Capillary refill normal to all digits.  No cyanosis or clubbing noted. Pedal hair growth normal.  Neurologic Normal speech. Oriented to person, place, and time. Epicritic sensation to light touch grossly present bilaterally.  Dermatologic Right submetatarsal 5 porokeratosis with central nucleated core noted.  Pain on palpation to the lesion.  No pinpoint bleeding noted.  Orthopedic: Normal joint ROM without pain or crepitus bilaterally. No visible deformities. No bony tenderness.   Radiographs: None Assessment:   1. Porokeratosis    Plan:  Patient was evaluated and treated and all questions answered.  Right submetatarsal 5 porokeratotic lesion -All questions and concerns were discussed with the patient extensive due to given the amount of pain that she is having she will benefit from debridement of the lesion using chisel blade handle the lesion was debrided down to healthy scar tissue no complication noted.  No pinpoint bleeding noted.  No follow-ups on file.

## 2023-07-02 ENCOUNTER — Encounter: Payer: Self-pay | Admitting: Gastroenterology

## 2023-07-02 ENCOUNTER — Ambulatory Visit: Payer: Medicare Other | Admitting: Gastroenterology

## 2023-07-02 VITALS — BP 130/70 | HR 64 | Ht 63.0 in | Wt 165.0 lb

## 2023-07-02 DIAGNOSIS — R143 Flatulence: Secondary | ICD-10-CM | POA: Diagnosis not present

## 2023-07-02 DIAGNOSIS — K5900 Constipation, unspecified: Secondary | ICD-10-CM

## 2023-07-02 DIAGNOSIS — R933 Abnormal findings on diagnostic imaging of other parts of digestive tract: Secondary | ICD-10-CM

## 2023-07-02 DIAGNOSIS — R142 Eructation: Secondary | ICD-10-CM

## 2023-07-02 MED ORDER — CITRUCEL PO POWD: ORAL | Status: DC

## 2023-07-02 MED ORDER — POLYETHYLENE GLYCOL 3350 17 G PO PACK: 17.0000 g | PACK | Freq: Every day | ORAL | Status: DC | PRN

## 2023-07-02 NOTE — Progress Notes (Signed)
HPI :  82 year old female with a history of A-fib on Eliquis, remote history of MI, CKD, here for new patient referral for belching and abnormal CT imaging of her esophagus.  Patient states for the past 3 to 6 months she has had belching that bother her.  This appears to bother her every day, appears to be getting worse over time.  She mostly notices this shortly after eating.  She is not in any pain when this happens.  She denies much of any heartburn at all.  Occasional rare regurgitation.  She has no dysphagia.  She has no nausea vomiting, eating well.  No postprandial abdominal pain.  She is never had a prior EGD.  She had a cardiac CT to evaluate her heart and this incidentally noted that her distal esophagus was thickened with a small hiatal hernia.  She was placed on Pepcid 20 mg once daily for the past month.  She states this has not made any difference in her belching and she feels about the same.  She does take simethicone/Gas-X occasionally that can help reduce her symptoms.  She states she does eat rather quickly when she eats.  She has been cutting back on her carbohydrate intake in hopes of losing weight and she states she is lost a few pounds recently.  She tries not to drink any carbonated beverages with significant.  She usually drinks coffee every morning.   She also complains of excessive gas and flatulence.  She has a bowel movement perhaps every day to every other day but requires use of fiber supplement and stool softeners and MiraLAX to help move her bowels.  She states MiraLAX works well when she uses it but does not use it every day.  She takes Metamucil and additional over-the-counter fiber supplements/Gummies daily.  She denies any blood in her stool.  She had a colonoscopy in July 2015 which was normal.  She has a history of perforated diverticulitis in 2015 prior to the colonoscopy.  She denies any cardiopulmonary symptoms that bother her.  She states her A-fib is  well-controlled on Eliquis.  She has not had any issues or hospitalizations with that recently.  She has no family history of esophageal or colon cancer.   Prior workup: Colonoscopy 03/24/2014: - diverticulosis, otherwise normal   Echo 03/18/23: EF 60-65%,  Mild LVH, grade II DD  Cardiac CT 04/20/23: RECOMMENDATIONS: CAD-RADS 2: Mild non-obstructive CAD (25-49%). Consider non-atherosclerotic causes of chest pain. Consider preventive therapy and risk factor modification.  IMPRESSION: 1. 6 mm posterior right middle lobe pulmonary nodule. Non-contrast chest CT at 6-12 months is recommended. If the nodule is stable at time of repeat CT, then future CT at 18-24 months (from today's scan) is considered optional for low-risk patients, but is recommended for high-risk patients. This recommendation follows the consensus statement: Guidelines for Management of Incidental Pulmonary Nodules Detected on CT Images: From the Fleischner Society 2017; Radiology 2017; 284:228-243. 2. Mild circumferential wall thickening distal esophagus with tiny hiatal hernia. Correlation for symptoms of esophagitis recommended.    Past Medical History:  Diagnosis Date   Allergy    Phreesia 07/04/2020   Arthritis    Phreesia 07/04/2020   Atrial fibrillation (HCC) 02/2013   paroxsymal; c/b Type 2 NSTEMI; CHADS2-VASc=2 (age, sex - patient had come off anti-HTN meds in past); Eliquis started;  Echocardiogram 03/10/13: EF 55-60%, grade 1 diastolic dysfunction, mild MR, mild LAE   CKD (chronic kidney disease)    3a  GERD (gastroesophageal reflux disease)    Phreesia 07/04/2020   Hyperlipidemia    Hypertension    Myocardial infarction [facility])    Phreesia 07/04/2020   NSTEMI (non-ST elevated myocardial infarction) (HCC) 02/2013   demand ischemia in setting of AFib with RVR; ETT-Myoview 03/11/13: No ischemia or scar, EF 70%.   PAF (paroxysmal atrial fibrillation) (HCC)      Past Surgical History:  Procedure  Laterality Date   APPENDECTOMY     CATARACT EXTRACTION     CATARACT EXTRACTION, BILATERAL     EYE SURGERY     STRABISMUS SURGERY     TONSILLECTOMY     YAG LASER APPLICATION     Family History  Problem Relation Age of Onset   Heart attack Mother 1   Cancer Maternal Aunt        throat   Colon cancer Neg Hx    Esophageal cancer Neg Hx    Stomach cancer Neg Hx    Social History   Tobacco Use   Smoking status: Never   Smokeless tobacco: Never  Vaping Use   Vaping status: Never Used  Substance Use Topics   Alcohol use: Not Currently   Drug use: No   Current Outpatient Medications  Medication Sig Dispense Refill   acetaminophen (TYLENOL) 500 MG tablet Take 500 mg by mouth daily as needed for pain.     apixaban (ELIQUIS) 5 MG TABS tablet TAKE 1 TABLET BY MOUTH 2 TIMES DAILY. 60 tablet 5   atorvastatin (LIPITOR) 20 MG tablet Take 1 tablet (20 mg total) by mouth daily. (Patient taking differently: Take 20 mg by mouth daily. Pt taking 10 mg a day) 90 tablet 3   azithromycin (ZITHROMAX) 250 MG tablet z-pack - take as directed for 5 days 6 tablet 0   Calcium Carb-Cholecalciferol (CALCIUM + D3) 600-200 MG-UNIT TABS Take 1 tablet by mouth daily.     Cholecalciferol (VITAMIN D) 400 UNITS capsule Take 400 Units by mouth daily.     Docusate Calcium (STOOL SOFTENER PO) Take by mouth at bedtime.     ELDERBERRY PO Take by mouth.     famotidine (PEPCID) 20 MG tablet Take 1 tablet (20 mg total) by mouth daily. 30 tablet 1   FIBER ADULT GUMMIES PO Take 1 Dose by mouth daily.     fish oil-omega-3 fatty acids 1000 MG capsule Take 1 g by mouth daily.     Flaxseed, Linseed, (FLAX SEED OIL PO) Take by mouth daily.     fluticasone (FLONASE) 50 MCG/ACT nasal spray Place 2 sprays into both nostrils daily. 16 g 6   hydrochlorothiazide (HYDRODIURIL) 12.5 MG tablet Take 1 tablet (12.5 mg total) by mouth daily. 90 tablet 3   losartan (COZAAR) 50 MG tablet Take 1 tablet (50 mg total) by mouth daily. 90  tablet 3   Magnesium 300 MG CAPS Take 1 capsule by mouth daily.     methylPREDNISolone (MEDROL) 4 MG TBPK tablet Take by mouth as directed for 6 days 21 tablet 0   metoprolol tartrate (LOPRESSOR) 50 MG tablet TAKE 1 TABLET BY MOUTH IN THE MORNING AND 1/2 TABLET IN THE AFTERNOON. NEED APPOINTMENT FOR REFILLS 135 tablet 2   Probiotic Product (PROBIOTIC DAILY PO) Take 1 tablet by mouth daily as needed.      vitamin C (ASCORBIC ACID) 500 MG tablet Take 500 mg by mouth daily.     No current facility-administered medications for this visit.   Allergies  Allergen Reactions  Amoxicillin Rash   Augmentin [Amoxicillin-Pot Clavulanate] Itching and Rash     Review of Systems: All systems reviewed and negative except where noted in HPI.   Lab Results  Component Value Date   WBC 8.0 04/07/2023   HGB 12.5 04/07/2023   HCT 38.5 04/07/2023   MCV 91 04/07/2023   PLT 241 04/07/2023    Lab Results  Component Value Date   NA 139 04/07/2023   CL 102 04/07/2023   K 5.2 04/07/2023   CO2 26 04/07/2023   BUN 19 04/07/2023   CREATININE 1.09 (H) 04/07/2023   EGFR 51 (L) 04/07/2023   CALCIUM 9.5 04/07/2023   ALBUMIN 4.1 07/29/2022   GLUCOSE 90 04/07/2023    Lab Results  Component Value Date   ALT 25 07/29/2022   AST 35 07/29/2022   ALKPHOS 67 07/29/2022   BILITOT 0.7 07/29/2022     Physical Exam: BP 130/70   Pulse 64   Ht 5\' 3"  (1.6 m)   Wt 165 lb (74.8 kg)   BMI 29.23 kg/m  Constitutional: Pleasant,well-developed, female in no acute distress. HEENT: Normocephalic and atraumatic. Conjunctivae are normal. No scleral icterus. Neck supple.  Cardiovascular: Normal rate, regular rhythm.  Pulmonary/chest: Effort normal and breath sounds normal. No wheezing, rales or rhonchi. Abdominal: Soft, nondistended, nontender.  There are no masses palpable. No hepatomegaly. Extremities: no edema Lymphadenopathy: No cervical adenopathy noted. Neurological: Alert and oriented to person place and  time. Skin: Skin is warm and dry. No rashes noted. Psychiatric: Normal mood and affect. Behavior is normal.   ASSESSMENT: 82 y.o. female here for assessment of the following  1. Belching   2. Abnormal finding on GI tract imaging   3. Excessive gas   4. Constipation, unspecified constipation type    Several months worth of symptoms of belching that bothers her.  Seems to be postprandial and perhaps related to eating very quickly.  Discussed that belching is usually not a sign of anything too concerning, perhaps she is swallowing air while she is eating quickly.  Trial of antacid has not helped this at all.  She really does not have much of any reflux symptoms at baseline.  I did review her CT scan which does show esophageal thickening and hiatal hernia.  We discussed these findings for a bit.  I do think she would benefit from eating more slowly, minimizing carbonated beverages, try to minimize swallowing air while eating.  She can also use Gas-X prior to each meal and see if these measures help with her belching.  Otherwise, I offered her an upper endoscopy to evaluate the CT scan findings, ensure no evidence of concerning pathology, however I suspect this is likely inflammatory changes related to reflux in the setting of hiatal hernia.  We discussed EGD, risks and benefits of the exam and anesthesia, she understands and wants to proceed.  She is stable from A-fib perspective and can do her procedure ON Eliquis, she does not need to hold that.  Otherwise I reviewed her symptoms of excessive gas and bloating.  She does have some baseline constipation.  She is taking a lot of Metamucil and other generic fiber supplements and that could be contributing to her excessive gas.  Recommend she stop those and substituted with Citrucel which is less likely to be gas-forming.  She should also take her MiraLAX daily to help make sure her constipation is treated.  She agrees.  Colonoscopy is  up-to-date.  PLAN: - continue pepcid for now -  EGD at the [facility] ON Eliquis - slow down her eating, minimize air swallowing while eating - use simethicone / gas ex prior to meals - stop routine use of metamucil and OTC fiber gummies - use Citrucel once daily - use Miralax daily or as needed to treat constipation - no colonoscopy at this time, will await her course  Harlin Rain, MD [facility]  CC: [name], MD

## 2023-07-02 NOTE — Patient Instructions (Addendum)
You have been scheduled for an endoscopy on 07-21-23. Please follow written instructions given to you at your visit today.  If you use inhalers (even only as needed), please bring them with you on the day of your procedure.  If you take any of the following medications, they will need to be adjusted prior to your procedure:   DO NOT TAKE 7 DAYS PRIOR TO TEST- Trulicity (dulaglutide) Ozempic, Wegovy (semaglutide) Mounjaro (tirzepatide) Bydureon Bcise (exanatide extended release)  DO NOT TAKE 1 DAY PRIOR TO YOUR TEST Rybelsus (semaglutide) Adlyxin (lixisenatide) Victoza (liraglutide) Byetta (exanatide) ___________________________________________________________________________   Continue Pepcid.( Eat slowly.  You can use Gas-X (simethicone), over-the-counter, prior to meals.  Stop Metamucil and fiber gummies  Please purchase the following medications over the counter and take as directed: Citrucel - take once daily as directed Miralax - use one daily or as needed for constipation  Thank you for entrusting me with your care and for choosing [facility], Dr. Ileene Patrick    If your blood pressure at your visit was 140/90 or greater, please contact your primary care physician to follow up on this. ______________________________________________________  If you are age 73 or older, your body mass index should be between 23-30. Your Body mass index is 29.23 kg/m. If this is out of the aforementioned range listed, please consider follow up with your Primary Care Provider.  If you are age 83 or younger, your body mass index should be between 19-25. Your Body mass index is 29.23 kg/m. If this is out of the aformentioned range listed, please consider follow up with your Primary Care Provider.  ________________________________________________________  The [facility] providers would like to encourage you to use [facility] to communicate with providers for non-urgent requests or  questions.  Due to long hold times on the telephone, sending your provider a message by [facility] may be a faster and more efficient way to get a response.  Please allow 48 business hours for a response.  Please remember that this is for non-urgent requests.  _______________________________________________________  Due to recent changes in healthcare laws, you may see the results of your imaging and laboratory studies on MyChart before your provider has had a chance to review them.  We understand that in some cases there may be results that are confusing or concerning to you. Not all laboratory results come back in the same time frame and the provider may be waiting for multiple results in order to interpret others.  Please give [place] 48 hours in order for your provider to thoroughly review all the results before contacting the office for clarification of your results.

## 2023-07-07 ENCOUNTER — Encounter: Payer: Self-pay | Admitting: Gastroenterology

## 2023-07-16 ENCOUNTER — Encounter: Payer: Self-pay | Admitting: Certified Registered Nurse Anesthetist

## 2023-07-20 ENCOUNTER — Other Ambulatory Visit: Payer: Self-pay | Admitting: Cardiology

## 2023-07-20 DIAGNOSIS — I480 Paroxysmal atrial fibrillation: Secondary | ICD-10-CM

## 2023-07-21 ENCOUNTER — Encounter: Payer: Self-pay | Admitting: Gastroenterology

## 2023-07-21 ENCOUNTER — Ambulatory Visit: Payer: Medicare Other | Admitting: Gastroenterology

## 2023-07-21 VITALS — BP 141/83 | HR 79 | Temp 98.0°F | Resp 26 | Ht 63.0 in | Wt 165.0 lb

## 2023-07-21 DIAGNOSIS — K319 Disease of stomach and duodenum, unspecified: Secondary | ICD-10-CM | POA: Diagnosis not present

## 2023-07-21 DIAGNOSIS — K2940 Chronic atrophic gastritis without bleeding: Secondary | ICD-10-CM | POA: Diagnosis not present

## 2023-07-21 DIAGNOSIS — K3189 Other diseases of stomach and duodenum: Secondary | ICD-10-CM | POA: Diagnosis not present

## 2023-07-21 DIAGNOSIS — R933 Abnormal findings on diagnostic imaging of other parts of digestive tract: Secondary | ICD-10-CM | POA: Diagnosis not present

## 2023-07-21 MED ORDER — SODIUM CHLORIDE 0.9 % IV SOLN: 500.0000 mL | INTRAVENOUS | Status: DC

## 2023-07-21 NOTE — Progress Notes (Signed)
Pt's states no medical or surgical changes since previsit or office visit. 

## 2023-07-21 NOTE — Patient Instructions (Addendum)
   Await biopsy results of esophagus & of stomach   Resume previous medications and diet     Continue previous diet & medicationsYOU HAD AN ENDOSCOPIC PROCEDURE TODAY AT THE [facility]:   Refer to the procedure report that was given to you for any specific questions about what was found during the examination.  If the procedure report does not answer your questions, please call your gastroenterologist to clarify.  If you requested that your care partner not be given the details of your procedure findings, then the procedure report has been included in a sealed envelope for you to review at your convenience later.  YOU SHOULD EXPECT: Some feelings of bloating in the abdomen. Passage of more gas than usual.  Walking can help get rid of the air that was put into your GI tract during the procedure and reduce the bloating. If you had a lower endoscopy (such as a colonoscopy or flexible sigmoidoscopy) you may notice spotting of blood in your stool or on the toilet paper. If you underwent a bowel prep for your procedure, you may not have a normal bowel movement for a few days.  Please Note:  You might notice some irritation and congestion in your nose or some drainage.  This is from the oxygen used during your procedure.  There is no need for concern and it should clear up in a day or so.  SYMPTOMS TO REPORT IMMEDIATELY:  Following upper endoscopy (EGD)  Vomiting of blood or coffee ground material  New chest pain or pain under the shoulder blades  Painful or persistently difficult swallowing  New shortness of breath  Fever of 100F or higher  Black, tarry-looking stools  For urgent or emergent issues, a gastroenterologist can be reached at any hour by calling [phone]. Do not use MyChart messaging for urgent concerns.    DIET:  We do recommend a small meal at first, but then you may proceed to your regular diet.  Drink plenty of fluids but you should avoid alcoholic beverages  for 24 hours.  ACTIVITY:  You should plan to take it easy for the rest of today and you should NOT DRIVE or use heavy machinery until tomorrow (because of the sedation medicines used during the test).    FOLLOW UP: Our staff will call the number listed on your records the next business day following your procedure.  We will call around 7:15- 8:00 am to check on you and address any questions or concerns that you may have regarding the information given to you following your procedure. If we do not reach you, we will leave a message.     If any biopsies were taken you will be contacted by phone or by letter within the next 1-3 weeks.  Please call us at [phone] if you have not heard about the biopsies in 3 weeks.    SIGNATURES/CONFIDENTIALITY: You and/or your care partner have signed paperwork which will be entered into your electronic medical record.  These signatures attest to the fact that that the information above on your After Visit Summary has been reviewed and is understood.  Full responsibility of the confidentiality of this discharge information lies with you and/or your care-partner.

## 2023-07-21 NOTE — Progress Notes (Signed)
Report given to PACU, vss 

## 2023-07-21 NOTE — Progress Notes (Signed)
History and Physical Interval Note: Last office visit 07/02/23 - no interval changes. See that note for details of her case. EGD to evaluate abnormal CT scan showing thickening of the esophagus. She has had belching that has bothered her recently but otherwise no dysphagia, etc. EGD done ON Eliquis to further evaluate. Otherwise feels at baseline today.    07/21/2023 4:14 PM  Kelli [name]  has presented today for endoscopic procedure(s), with the diagnosis of  Encounter Diagnosis  Name Primary?   Abnormal CT scan, esophagus Yes  .  The various methods of evaluation and treatment have been discussed with the patient and/or family. After consideration of risks, benefits and other options for treatment, the patient has consented to  the endoscopic procedure(s).   The patient's history has been reviewed, patient examined, no change in status, stable for surgery.  I have reviewed the patient's chart and labs.  Questions were answered to the patient's satisfaction.    Harlin Rain, MD [facility]

## 2023-07-21 NOTE — Progress Notes (Signed)
Called to room to assist during endoscopic procedure.  Patient ID and intended procedure confirmed with present staff. Received instructions for my participation in the procedure from the performing physician.  

## 2023-07-21 NOTE — Progress Notes (Signed)
1621 Robinul 0.1 mg IV given due large amount of secretions upon assessment.  MD made aware, vss

## 2023-07-21 NOTE — Op Note (Signed)
[facility] Patient Name: Kelli [name] Procedure Date: 07/21/2023 4:21 PM MRN: [id number] Endoscopist: Viviann Spare P. Adela Lank , MD, [id number] Age: 82 Referring MD:  Date of Birth: 1940/11/25 Gender: Female Account #: [id number] Procedure:                Upper GI endoscopy Indications:              Abnormal CT of the GI tract - esophageal                            thickening, patient also endorses belching /                            indigestion at times. Exam done on Eliquis. Medicines:                Monitored Anesthesia Care Procedure:                Pre-Anesthesia Assessment:                           - Prior to the procedure, a History and Physical                            was performed, and patient medications and                            allergies were reviewed. The patient's tolerance of                            previous anesthesia was also reviewed. The risks                            and benefits of the procedure and the sedation                            options and risks were discussed with the patient.                            All questions were answered, and informed consent                            was obtained. Prior Anticoagulants: The patient has                            taken Eliquis (apixaban), last dose was day of                            procedure. ASA Grade Assessment: III - A patient                            with severe systemic disease. After reviewing the                            risks and benefits, the patient was deemed in  satisfactory condition to undergo the procedure.                           After obtaining informed consent, the endoscope was                            passed under direct vision. Throughout the                            procedure, the patient's blood pressure, pulse, and                            oxygen saturations were monitored continuously. The                            GIF  [id number] #[id number] was introduced through the                            mouth, and advanced to the second part of duodenum.                            The upper GI endoscopy was accomplished without                            difficulty. The patient tolerated the procedure                            well. Scope In: Scope Out: Findings:                 Esophagogastric landmarks were identified: the                            Z-line was found at 40 cm, the gastroesophageal                            junction was found at 40 cm and the upper extent of                            the gastric folds was found at 40 cm from the                            incisors.                           The Z-line was regular and was found 40 cm from the                            incisors.                           The exam of the esophagus was otherwise normal. No                            concerning findings in relation  to CT changes.                           Mucosal changes were found in the prepyloric region                            of the stomach - nodular mucosa with some                            restricted mobility. Lumen was patent but                            restricted, not sure if she had an ulcer there                            previously which has since healed? Biopsies were                            taken with a cold forceps for histology.                           Atrophic mucosa was found in the entire examined                            stomach. Biopsies were taken with a cold forceps                            for histology, rule out H pylori.                           The exam of the stomach was otherwise normal.                           The examined duodenum was normal. Complications:            No immediate complications. Estimated blood loss:                            Minimal. Estimated Blood Loss:     Estimated blood loss was minimal. Impression:               -  Esophagogastric landmarks identified.                           - Z-line regular, 40 cm from the incisors.                           - Normal esophagus otherwise.                           - Mucosal changes in the prepyloric region of the                            stomach. Biopsied.                           -  Gastric mucosal atrophy. Biopsied.                           - Normal examined duodenum.                           No concerning pathology noted in regards to CT                            findings, however, incidental gastric findings                            noted. Recommendation:           - Patient has a contact number available for                            emergencies. The signs and symptoms of potential                            delayed complications were discussed with the                            patient. Return to normal activities tomorrow.                            Written discharge instructions were provided to the                            patient.                           - Resume previous diet.                           - Continue present medications including Eliquis                           - Await pathology results. Viviann Spare P. Edis Huish, MD 07/21/2023 4:40:58 PM This report has been signed electronically.

## 2023-07-22 ENCOUNTER — Telehealth: Payer: Self-pay

## 2023-07-22 NOTE — Telephone Encounter (Signed)
Left message on answering machine. 

## 2023-07-24 ENCOUNTER — Other Ambulatory Visit: Payer: Self-pay | Admitting: Cardiology

## 2023-07-24 DIAGNOSIS — I480 Paroxysmal atrial fibrillation: Secondary | ICD-10-CM

## 2023-07-24 LAB — SURGICAL PATHOLOGY

## 2023-07-25 ENCOUNTER — Other Ambulatory Visit: Payer: Self-pay | Admitting: Cardiology

## 2023-07-31 DIAGNOSIS — J069 Acute upper respiratory infection, unspecified: Secondary | ICD-10-CM | POA: Diagnosis not present

## 2023-07-31 DIAGNOSIS — J019 Acute sinusitis, unspecified: Secondary | ICD-10-CM | POA: Diagnosis not present

## 2023-08-04 ENCOUNTER — Ambulatory Visit (INDEPENDENT_AMBULATORY_CARE_PROVIDER_SITE_OTHER): Payer: Medicare Other | Admitting: Family Medicine

## 2023-08-04 ENCOUNTER — Encounter: Payer: Self-pay | Admitting: Family Medicine

## 2023-08-04 VITALS — BP 135/56 | HR 63 | Ht 63.0 in | Wt 163.1 lb

## 2023-08-04 DIAGNOSIS — J0110 Acute frontal sinusitis, unspecified: Secondary | ICD-10-CM | POA: Diagnosis not present

## 2023-08-04 MED ORDER — CEFDINIR 300 MG PO CAPS: 300.0000 mg | ORAL_CAPSULE | Freq: Two times a day (BID) | ORAL | 0 refills | Status: AC

## 2023-08-04 NOTE — Assessment & Plan Note (Signed)
Symptoms have not improved after at least 7 days.  Prescribed cefdinir for 5 days.  Patient would like to wait another day before she picks this medication up.  Advised patient if she is not improved after finishing antibiotics or improving without the antibiotics to let us know.

## 2023-08-04 NOTE — Progress Notes (Signed)
   Acute Office Visit  Subjective:     Patient ID: Kelli [name], female    DOB: [birth year], 82 y.o.   MRN: [id number]  Chief Complaint  Patient presents with   Sinus Problem    HPI Patient is in today for sinus headache, sore throat, nasal congestion.  For the past 7 or 8 days, the patient has been feeling a pressure in her cheeks and forehead, with nasal congestion, cough with yellow sputum.  No fevers, no shortness of breath outside of her baseline.  No nausea vomiting.  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  She has been taking Mucinex and Tylenol but nothing else.   ROS      Objective:    BP (!) 135/56   Pulse 63   Ht 5\' 3"  (1.6 m)   Wt 163 lb 1.9 oz (74 kg)   SpO2 97%   BMI 28.90 kg/m    Physical Exam General: Alert, oriented HEENT: PERRLA, moist mucosa.  Tenderness of the frontal and maxillary sinuses. CV: Regular rate and rhythm Pulmonary: Lungs clear bilaterally   No results found for any visits on 08/04/23.      Assessment & Plan:   Acute frontal sinusitis, recurrence not specified Assessment & Plan: Symptoms have not improved after at least 7 days.  Prescribed cefdinir for 5 days.  Patient would like to wait another day before she picks this medication up.  Advised patient if she is not improved after finishing antibiotics or improving without the antibiotics to let us know.   Other orders -     Cefdinir; Take 1 capsule (300 mg total) by mouth 2 (two) times daily for 5 days.  Dispense: 10 capsule; Refill: 0     Return in about 4 weeks (around 09/01/2023).  [name], MD

## 2023-08-04 NOTE — Patient Instructions (Signed)
It was nice to see you today,  We addressed the following topics today: -I have sent in a prescription for antibiotics for you to take twice a day for the next 5 days.  This is for a possible bacterial sinusitis. - If your symptoms are not better after you finish taking the medication let us know - Other things you can take to help with symptoms include nasal saline spray, gargling with oral saline, taking guaifenesin 600 mg every 12 hours (Mucinex 12-hour or generic)  Have a great day,  Frederic Jericho, MD

## 2023-08-19 ENCOUNTER — Other Ambulatory Visit: Payer: Self-pay | Admitting: Cardiology

## 2023-09-01 ENCOUNTER — Encounter: Payer: Self-pay | Admitting: Family Medicine

## 2023-09-01 ENCOUNTER — Ambulatory Visit (INDEPENDENT_AMBULATORY_CARE_PROVIDER_SITE_OTHER): Payer: Medicare Other | Admitting: Family Medicine

## 2023-09-01 VITALS — BP 122/74 | HR 66 | Ht 63.0 in | Wt 164.1 lb

## 2023-09-01 DIAGNOSIS — K2100 Gastro-esophageal reflux disease with esophagitis, without bleeding: Secondary | ICD-10-CM

## 2023-09-01 DIAGNOSIS — E782 Mixed hyperlipidemia: Secondary | ICD-10-CM | POA: Diagnosis not present

## 2023-09-01 DIAGNOSIS — R0609 Other forms of dyspnea: Secondary | ICD-10-CM

## 2023-09-01 NOTE — Progress Notes (Signed)
   Established Patient Office Visit  Subjective   Patient ID: Kelli [name], female    DOB: 03-14-1941  Age: 82 y.o. MRN: [id number]  Chief Complaint  Patient presents with   Medical Management of Chronic Issues    HPI  Hyperlipidemia-patient is taking her Lipitor.  She is cutting a half a tablet of 20 mg and taking that daily.  Cannot tolerate 20 mg dose due to muscle pain.  We discussed co-Q10 as a possible option for helping with muscle aches.  GERD-we talked about the patient's endoscopy findings which were largely normal.  Lung nodule-patient has follow-up scheduled in February.  We will follow-up with her after that.  Dyspnea-patient still complains of dyspnea with walking, lifting, sometimes with eating.  We discussed reasons to do spirometry to rule out a pulmonary cause of her dyspnea.   The ASCVD Risk score (Arnett DK, et al., 2019) failed to calculate for the following reasons:   The 2019 ASCVD risk score is only valid for ages 38 to 21   Risk score cannot be calculated because patient has a medical history suggesting prior/existing ASCVD  Health Maintenance Due  Topic Date Due   Zoster Vaccines- Shingrix (1 of 2) Never done   Pneumonia Vaccine 59+ Years old (2 of 2 - PCV) 06/12/2010   COVID-19 Vaccine (3 - Moderna risk series) 12/06/2019   DTaP/Tdap/Td (2 - Td or Tdap) 07/15/2021   Medicare Annual Wellness (AWV)  09/04/2023      Objective:     BP 122/74   Pulse 66   Ht 5\' 3"  (1.6 m)   Wt 164 lb 1.9 oz (74.4 kg)   SpO2 98%   BMI 29.07 kg/m    Physical Exam General: Alert, oriented CV: Regular rate rhythm no murmurs Pulmonary: Lungs clear bilaterally   No results found for any visits on 09/01/23.      Assessment & Plan:   Mixed hyperlipidemia Assessment & Plan: Continue Lipitor.  Is taking a half a tablet of 20 mg.  Cannot tolerate the full tablet of 20.  Discussed co-Q10 as an option for muscle pain.  Continue to monitor.  Last LDL 2 months ago  was 83.   Gastroesophageal reflux disease with esophagitis, unspecified whether hemorrhage Assessment & Plan: Had endoscopy.  Endoscopy did not show any concerning findings.  Biopsy was also benign.  Nothing on endoscopy to correlate with the esophagitis mentioned on CT imaging.   Dyspnea on exertion Assessment & Plan: Still trying to coordinate spirometry testing.  Cardiac causes have been ruled out.  CT scan shows nodule but otherwise normal.        Return in about 14 weeks (around 12/08/2023) for dyspnea, ct scan f/u.    [name], MD

## 2023-09-01 NOTE — Assessment & Plan Note (Signed)
Had endoscopy.  Endoscopy did not show any concerning findings.  Biopsy was also benign.  Nothing on endoscopy to correlate with the esophagitis mentioned on CT imaging.

## 2023-09-01 NOTE — Assessment & Plan Note (Signed)
Continue Lipitor.  Is taking a half a tablet of 20 mg.  Cannot tolerate the full tablet of 20.  Discussed co-Q10 as an option for muscle pain.  Continue to monitor.  Last LDL 2 months ago was 83.

## 2023-09-01 NOTE — Assessment & Plan Note (Signed)
Still trying to coordinate spirometry testing.  Cardiac causes have been ruled out.  CT scan shows nodule but otherwise normal.

## 2023-09-01 NOTE — Patient Instructions (Signed)
It was nice to see you today,  We addressed the following topics today: -Continue taking your Lipitor.  You can try taking co-Q10 which is over-the-counter to help with pain that may be caused by statins - We will try to schedule your pulmonary function testing.  When it is time to reach out to you someone will call you to schedule the appointment. - Will follow-up in 3 to 4 months.  Have a great day,  Frederic Jericho, MD

## 2023-09-02 DIAGNOSIS — M9904 Segmental and somatic dysfunction of sacral region: Secondary | ICD-10-CM | POA: Diagnosis not present

## 2023-09-02 DIAGNOSIS — M5388 Other specified dorsopathies, sacral and sacrococcygeal region: Secondary | ICD-10-CM | POA: Diagnosis not present

## 2023-09-02 DIAGNOSIS — M9902 Segmental and somatic dysfunction of thoracic region: Secondary | ICD-10-CM | POA: Diagnosis not present

## 2023-09-02 DIAGNOSIS — M47817 Spondylosis without myelopathy or radiculopathy, lumbosacral region: Secondary | ICD-10-CM | POA: Diagnosis not present

## 2023-09-02 DIAGNOSIS — M4304 Spondylolysis, thoracic region: Secondary | ICD-10-CM | POA: Diagnosis not present

## 2023-09-02 DIAGNOSIS — M9903 Segmental and somatic dysfunction of lumbar region: Secondary | ICD-10-CM | POA: Diagnosis not present

## 2023-09-07 ENCOUNTER — Ambulatory Visit: Payer: Medicare Other

## 2023-09-07 DIAGNOSIS — Z0000 Encounter for general adult medical examination without abnormal findings: Secondary | ICD-10-CM | POA: Diagnosis not present

## 2023-09-07 NOTE — Patient Instructions (Signed)
Kelli [name] , Thank you for taking time to come for your Medicare Wellness Visit. I appreciate your ongoing commitment to your health goals. Please review the following plan we discussed and let me know if I can assist you in the future.   Referrals/Orders/Follow-Ups/Clinician Recommendations: none  This is a list of the screening recommended for you and due dates:  Health Maintenance  Topic Date Due   Pneumonia Vaccine (2 of 2 - PCV) 06/12/2010   DTaP/Tdap/Td vaccine (2 - Td or Tdap) 07/15/2021   COVID-19 Vaccine (3 - Moderna risk series) 09/23/2023*   Zoster (Shingles) Vaccine (1 of 2) 12/06/2023*   Medicare Annual Wellness Visit  09/06/2024   Flu Shot  Completed   DEXA scan (bone density measurement)  Completed   HPV Vaccine  Aged Out  *Topic was postponed. The date shown is not the original due date.    Advanced directives: (Copy Requested) Please bring a copy of your health care power of attorney and living will to the office to be added to your chart at your convenience.  Next Medicare Annual Wellness Visit scheduled for next year: Yes  Insert Preventive Care attachment Insert FALL PREVENTION attachment if needed

## 2023-09-07 NOTE — Progress Notes (Signed)
Subjective:   Kelli [name] is a 82 y.o. female who presents for Medicare Annual (Subsequent) preventive examination.  Visit Complete: Virtual I connected with  Berry C Munn on 09/07/23 by a audio enabled telemedicine application and verified that I am speaking with the correct person using two identifiers.  Patient Location: Home  Provider Location: Office/Clinic  I discussed the limitations of evaluation and management by telemedicine. The patient expressed understanding and agreed to proceed.  Vital Signs: Because this visit was a virtual/telehealth visit, some criteria may be missing or patient reported. Any vitals not documented were not able to be obtained and vitals that have been documented are patient reported.    Cardiac Risk Factors include: advanced age (>36men, >48 women);dyslipidemia;hypertension     Objective:    Today's Vitals   09/07/23 1442  PainSc: 2    There is no height or weight on file to calculate BMI.     09/07/2023    2:50 PM 02/02/2023    4:09 PM 06/15/2019    8:13 AM 12/24/2013    3:00 AM 03/09/2013    8:10 PM  Advanced Directives  Does Patient Have a Medical Advance Directive? Yes Yes Yes Patient has advance directive, copy not in chart Patient has advance directive, copy not in chart  Type of Advance Directive Healthcare Power of [place];Living will   Living will Living will;Healthcare Power of Attorney  Does patient want to make changes to medical advance directive?    No change requested   Copy of Healthcare Power of Attorney in Chart? No - copy requested   Copy requested from family Copy requested from family  Pre-existing out of facility DNR order (yellow form or pink MOST form)    No     Current Medications (verified) Outpatient Encounter Medications as of 09/07/2023  Medication Sig   acetaminophen (TYLENOL) 500 MG tablet Take 500 mg by mouth daily as needed for pain.   apixaban (ELIQUIS) 5 MG TABS tablet TAKE 1 TABLET BY MOUTH 2 TIMES  DAILY.   Biotin 1 MG CAPS Take 1 tablet by mouth at bedtime.   Calcium Carb-Cholecalciferol (CALCIUM + D3) 600-200 MG-UNIT TABS Take 1 tablet by mouth daily.   Cholecalciferol (VITAMIN D) 400 UNITS capsule Take 400 Units by mouth daily.   Docusate Calcium (STOOL SOFTENER PO) Take by mouth at bedtime.   ELDERBERRY PO Take by mouth.   famotidine (PEPCID) 20 MG tablet Take 1 tablet (20 mg total) by mouth daily.   fish oil-omega-3 fatty acids 1000 MG capsule Take 1 g by mouth daily.   Flaxseed, Linseed, (FLAX SEED OIL PO) Take by mouth daily.   fluticasone (FLONASE) 50 MCG/ACT nasal spray Place 2 sprays into both nostrils daily.   hydrochlorothiazide (HYDRODIURIL) 12.5 MG tablet TAKE 1 TABLET (12.5 MG TOTAL) BY MOUTH DAILY.   losartan (COZAAR) 50 MG tablet Take 1 tablet (50 mg total) by mouth daily.   Magnesium 300 MG CAPS Take 1 capsule by mouth daily.   methylcellulose (CITRUCEL) oral powder Use as directed daily   metoprolol tartrate (LOPRESSOR) 50 MG tablet TAKE 1 TABLET BY MOUTH IN THE MORNING AND TAKE 1/2 TABLET IN THE AFTERNOON   polyethylene glycol (MIRALAX) 17 g packet Take 17 g by mouth daily as needed.   Probiotic Product (PROBIOTIC DAILY PO) Take 1 tablet by mouth daily as needed.    vitamin C (ASCORBIC ACID) 500 MG tablet Take 500 mg by mouth daily.   atorvastatin (LIPITOR) 20 MG  tablet Take 1 tablet (20 mg total) by mouth daily. (Patient taking differently: Take 20 mg by mouth daily. Pt taking 10 mg a day)   azithromycin (ZITHROMAX) 250 MG tablet z-pack - take as directed for 5 days (Patient not taking: Reported on 07/21/2023)   methylPREDNISolone (MEDROL) 4 MG TBPK tablet Take by mouth as directed for 6 days (Patient not taking: Reported on 07/21/2023)   No facility-administered encounter medications on file as of 09/07/2023.    Allergies (verified) Amoxicillin and Augmentin [amoxicillin-pot clavulanate]   History: Past Medical History:  Diagnosis Date   Allergy    [facility]reesia  07/04/2020   Arthritis    [facility] 07/04/2020   Atrial fibrillation (HCC) 02/2013   paroxsymal; c/b Type 2 NSTEMI; CHADS2-VASc=2 (age, sex - patient had come off anti-HTN meds in past); Eliquis started;  Echocardiogram 03/10/13: EF 55-60%, grade 1 diastolic dysfunction, mild MR, mild LAE   CKD (chronic kidney disease)    3a   GERD (gastroesophageal reflux disease)    [facility]ia 07/04/2020   Hyperlipidemia    Hypertension    Myocardial infarction [facility])    Phreesia 07/04/2020   NSTEMI (non-ST elevated myocardial infarction) (HCC) 02/2013   demand ischemia in setting of AFib with RVR; ETT-Myoview 03/11/13: No ischemia or scar, EF 70%.   PAF (paroxysmal atrial fibrillation) (HCC)    Past Surgical History:  Procedure Laterality Date   APPENDECTOMY     CATARACT EXTRACTION     CATARACT EXTRACTION, BILATERAL     EYE SURGERY     STRABISMUS SURGERY     TONSILLECTOMY     YAG LASER APPLICATION     Family History  Problem Relation Age of Onset   Heart attack Mother 57   Cancer Maternal Aunt        throat   Colon cancer Neg Hx    Esophageal cancer Neg Hx    Stomach cancer Neg Hx    Social History   Socioeconomic History   Marital status: Widowed    Spouse name: Not on file   Number of children: 3   Years of education: Not on file   Highest education level: Not on file  Occupational History   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    Employer: [employer]   Occupation: asst Interior and spatial designer  Tobacco Use   Smoking status: Never   Smokeless tobacco: Never  Vaping Use   Vaping status: Never Used  Substance and Sexual Activity   Alcohol use: Not Currently   Drug use: No   Sexual activity: Not Currently  Other Topics Concern   Not on file  Social History Narrative   Not on file   Social Drivers of Health   Financial Resource Strain: Low Risk  (09/07/2023)   Overall Financial Resource Strain (CARDIA)    Difficulty of Paying Living Expenses: Not hard at all  Food  Insecurity: No Food Insecurity (09/07/2023)   Hunger Vital Sign    Worried About Running Out of Food in the Last Year: Never true    Ran Out of Food in the Last Year: Never true  Transportation Needs: No Transportation Needs (09/07/2023)   PRAPARE - Administrator, Civil Service (Medical): No    Lack of Transportation (Non-Medical): No  Physical Activity: Sufficiently Active (09/07/2023)   Exercise Vital Sign    Days of Exercise per Week: 5 days    Minutes of Exercise per Session: 60 min  Stress: No Stress Concern Present (09/07/2023)  [employer] of Occupational Health - Occupational Stress Questionnaire    Feeling of Stress : Not at all  Social Connections: Moderately Integrated (09/07/2023)   Social Connection and Isolation Panel [NHANES]    Frequency of Communication with Friends and Family: More than three times a week    Frequency of Social Gatherings with Friends and Family: More than three times a week    Attends Religious Services: More than 4 times per year    Active Member of [employer] or Organizations: Yes    Attends Banker Meetings: More than 4 times per year    Marital Status: Widowed    Tobacco Counseling Counseling given: Not Answered   Clinical Intake:  Pre-visit preparation completed: Yes  Pain : 0-10 Pain Score: 2  Pain Type: Chronic pain Pain Location: Hip Pain Orientation: Right Pain Descriptors / Indicators: Aching Pain Onset: More than a month ago Pain Frequency: Constant     Nutritional Risks: None Diabetes: No  How often do you need to have someone help you when you read instructions, pamphlets, or other written materials from your doctor or pharmacy?: 1 - Never  Interpreter Needed?: No  Information entered by :: NAllen LPN   Activities of Daily Living    09/07/2023    2:43 PM  In your present state of health, do you have any difficulty performing the following activities:  Hearing? 0  Vision? 1  Comment  blurry sometimes  Difficulty concentrating or making decisions? 0  Walking or climbing stairs? 1  Comment due to hip  Dressing or bathing? 0  Doing errands, shopping? 0  Preparing Food and eating ? N  Using the Toilet? N  In the past six months, have you accidently leaked urine? N  Do you have problems with loss of bowel control? N  Managing your Medications? N  Managing your Finances? N  Housekeeping or managing your Housekeeping? N    Patient Care Team: [name], MD as PCP - General (Family Medicine) [place], Peter M, MD as PCP - Cardiology (Cardiology) [place], Peter M, MD as Consulting Physician (Cardiology) Nita Sells, MD as Consulting Physician (Dermatology) Graylin Shiver, MD as Consulting Physician (Gastroenterology) Care, [facility], Roe R[place], [place] as Physician Assistant (Cardiology)  Indicate any recent Medical Services you may have received from other than [facility] providers in the past year (date may be approximate).     Assessment:   This is a routine wellness examination for Frankye.  Hearing/Vision screen Hearing Screening - Comments:: Denies hearing issues Vision Screening - Comments:: Regular eye exams, [facility]   Goals Addressed             This Visit's Progress    Patient Stated       09/07/2023, wants to lose 10 pounds       Depression Screen    09/07/2023    2:53 PM 09/01/2023   10:30 AM 08/04/2023   10:47 AM 05/12/2023    8:52 AM 04/08/2023    9:29 AM 02/02/2023    4:10 PM 09/03/2022    2:07 PM  PHQ 2/9 Scores  PHQ - 2 Score 0 0 0 0 0 0 0  PHQ- 9 Score  0 1 1  1 2     Fall Risk    09/07/2023    2:51 PM 02/02/2023    4:10 PM 09/03/2022    2:08 PM 09/05/2021   11:34 AM 07/18/2021    9:04 AM  Fall Risk  Falls in the past year? 0 0 0 0 0  Number falls in past yr: 0 0 1 0 0  Injury with Fall? 0 0 0 0 0  Risk for fall due to : Medication side effect No Fall Risks     Follow up Falls prevention discussed;Falls  evaluation completed Falls evaluation completed  Falls evaluation completed Falls evaluation completed    MEDICARE RISK AT HOME: Medicare Risk at Home Any stairs in or around the home?: No If so, are there any without handrails?: No Home free of loose throw rugs in walkways, pet beds, electrical cords, etc?: Yes Adequate lighting in your home to reduce risk of falls?: Yes Life alert?: No Use of a cane, walker or w/c?: No Grab bars in the bathroom?: Yes Shower chair or bench in shower?: No Elevated toilet seat or a handicapped toilet?: Yes  TIMED UP AND GO:  Was the test performed?  No    Cognitive Function:        09/07/2023    2:53 PM 09/03/2022    1:59 PM 07/05/2020    1:16 PM 06/15/2019    8:14 AM 12/22/2017    8:30 AM  6CIT Screen  What Year? 0 points 0 points 0 points 0 points 0 points  What month? 0 points 0 points 0 points 0 points 0 points  What time? 3 points 0 points 0 points 0 points 0 points  Count back from 20 0 points 0 points 0 points 0 points 0 points  Months in reverse 0 points 0 points 0 points 0 points 0 points  Repeat phrase 2 points 0 points 0 points 0 points 0 points  Total Score 5 points 0 points 0 points 0 points 0 points    Immunizations Immunization History  Administered Date(s) Administered   Fluad Quad(high Dose 65+) 08/16/2019   Influenza, High Dose Seasonal PF 08/09/2018   Influenza, Seasonal, Injecte, Preservative Fre 07/01/2023   Influenza,inj,Quad PF,6+ Mos 07/18/2021, 07/29/2022   Moderna Sars-Covid-2 Vaccination 10/22/2019, 11/08/2019   Pneumococcal Polysaccharide-23 06/12/2009   Tdap 07/16/2011    TDAP status: Due, Education has been provided regarding the importance of this vaccine. Advised may receive this vaccine at local pharmacy or Health Dept. Aware to provide a copy of the vaccination record if obtained from local pharmacy or Health Dept. Verbalized acceptance and understanding.  Flu Vaccine status: Up to  date  Pneumococcal vaccine status: Due, Education has been provided regarding the importance of this vaccine. Advised may receive this vaccine at local pharmacy or Health Dept. Aware to provide a copy of the vaccination record if obtained from local pharmacy or Health Dept. Verbalized acceptance and understanding.  Covid-19 vaccine status: Information provided on how to obtain vaccines.   Qualifies for Shingles Vaccine? Yes   Zostavax completed No   Shingrix Completed?: No.    Education has been provided regarding the importance of this vaccine. Patient has been advised to call insurance company to determine out of pocket expense if they have not yet received this vaccine. Advised may also receive vaccine at local pharmacy or Health Dept. Verbalized acceptance and understanding.  Screening Tests Health Maintenance  Topic Date Due   Pneumonia Vaccine 40+ Years old (2 of 2 - PCV) 06/12/2010   DTaP/Tdap/Td (2 - Td or Tdap) 07/15/2021   COVID-19 Vaccine (3 - Moderna risk series) 09/23/2023 (Originally 12/06/2019)   Zoster Vaccines- Shingrix (1 of 2) 12/06/2023 (Originally 06/23/1960)   Medicare Annual Wellness (AWV)  09/06/2024  INFLUENZA VACCINE  Completed   DEXA SCAN  Completed   HPV VACCINES  Aged Out    Health Maintenance  Health Maintenance Due  Topic Date Due   Pneumonia Vaccine 51+ Years old (2 of 2 - PCV) 06/12/2010   DTaP/Tdap/Td (2 - Td or Tdap) 07/15/2021    Colorectal cancer screening: No longer required.   Mammogram status: No longer required due to age.  Bone Density status: Completed 11/22/2018.   Lung Cancer Screening: (Low Dose CT Chest recommended if Age 47-80 years, 20 pack-year currently smoking OR have quit w/in 15years.) does not qualify.   Lung Cancer Screening Referral: no  Additional Screening:  Hepatitis C Screening: does not qualify;   Vision Screening: Recommended annual ophthalmology exams for early detection of glaucoma and other disorders of the  eye. Is the patient up to date with their annual eye exam?  Yes  Who is the provider or what is the name of the office in which the patient attends annual eye exams? [facility] If pt is not established with a provider, would they like to be referred to a provider to establish care? No .   Dental Screening: Recommended annual dental exams for proper oral hygiene  Diabetic Foot Exam: n/a  Community Resource Referral / Chronic Care Management: CRR required this visit?  No   CCM required this visit?  No     Plan:     I have personally reviewed and noted the following in the patient's chart:   Medical and social history Use of alcohol, tobacco or illicit drugs  Current medications and supplements including opioid prescriptions. Patient is not currently taking opioid prescriptions. Functional ability and status Nutritional status Physical activity Advanced directives List of other physicians Hospitalizations, surgeries, and ER visits in previous 12 months Vitals Screenings to include cognitive, depression, and falls Referrals and appointments  In addition, I have reviewed and discussed with patient certain preventive protocols, quality metrics, and best practice recommendations. A written personalized care plan for preventive services as well as general preventive health recommendations were provided to patient.     Barb Merino, LPN   16/06/9603   After Visit Summary: (MyChart) Due to this being a telephonic visit, the after visit summary with patients personalized plan was offered to patient via MyChart   Nurse Notes: none

## 2023-09-29 ENCOUNTER — Other Ambulatory Visit: Payer: Self-pay | Admitting: Cardiology

## 2023-10-06 NOTE — Progress Notes (Deleted)
Kelli [name] Date of Birth: Kelli [name], Kelli [name] Medical Record #[id number]  History of Present Illness: Ms. Doda is seen for follow up of atrial fibrillation.  She has a history of HTN, paroxysmal Afib with RVR. Admitted in 2014. Had demand ischemia. Myoview study was normal.  Echo from [place] of 2014 with EF of 55 to 60% and grade 1 diastolic dysfunction. She has been on chronic therapy with metoprolol and Eliquis.   She was seen this past summer by Micah Flesher PA-C with symptoms of DOE. Echo showed gr 2 diastolic dysfunction with normal EF. Mod MR, mild-mod TR. BNP 151. Coronary CTA showed a high calcium score of 631 but mild nonobstructive CAD. There was ? LA appendage thrombus. Patient already on Eliquis. Pulmonary nodule noted with recommendation for follow up.    Current Outpatient Medications  Medication Sig Dispense Refill   acetaminophen (TYLENOL) 500 MG tablet Take 500 mg by mouth daily as needed for pain.     apixaban (ELIQUIS) 5 MG TABS tablet TAKE 1 TABLET BY MOUTH 2 TIMES DAILY. 60 tablet 5   atorvastatin (LIPITOR) [name] MG tablet Take 1 tablet ([name] mg total) by mouth daily. (Patient taking differently: Take [name] mg by mouth daily. Pt taking 10 mg a day) 90 tablet 3   azithromycin (ZITHROMAX) 250 MG tablet z-pack - take as directed for 5 days (Patient not taking: Reported on 07/21/2023) 6 tablet 0   Biotin 1 MG CAPS Take 1 tablet by mouth at bedtime.     Calcium Carb-Cholecalciferol (CALCIUM + D3) 600-200 MG-UNIT TABS Take 1 tablet by mouth daily.     Cholecalciferol (VITAMIN D) 400 UNITS capsule Take 400 Units by mouth daily.     Docusate Calcium (STOOL SOFTENER PO) Take by mouth at bedtime.     ELDERBERRY PO Take by mouth.     famotidine (PEPCID) [name] MG tablet Take 1 tablet ([name] mg total) by mouth daily. 30 tablet 1   fish oil-omega-3 fatty acids 1000 MG capsule Take 1 g by mouth daily.     Flaxseed, Linseed, (FLAX SEED OIL PO) Take by mouth daily.     fluticasone (FLONASE) 50 MCG/ACT nasal  spray Place 2 sprays into both nostrils daily. 16 g 6   hydrochlorothiazide (HYDRODIURIL) 12.5 MG tablet TAKE 1 TABLET (12.5 MG TOTAL) BY MOUTH DAILY. 90 tablet 1   losartan (COZAAR) 50 MG tablet TAKE 1 TABLET (50 MG TOTAL) BY MOUTH DAILY. 90 tablet 1   Magnesium 300 MG CAPS Take 1 capsule by mouth daily.     methylcellulose (CITRUCEL) oral powder Use as directed daily     methylPREDNISolone (MEDROL) 4 MG TBPK tablet Take by mouth as directed for 6 days (Patient not taking: Reported on 07/21/2023) 21 tablet 0   metoprolol tartrate (LOPRESSOR) 50 MG tablet TAKE 1 TABLET BY MOUTH IN THE MORNING AND TAKE 1/2 TABLET IN THE AFTERNOON 135 tablet 2   polyethylene glycol (MIRALAX) 17 g packet Take 17 g by mouth daily as needed.     Probiotic Product (PROBIOTIC DAILY PO) Take 1 tablet by mouth daily as needed.      vitamin C (ASCORBIC ACID) 500 MG tablet Take 500 mg by mouth daily.     No current facility-administered medications for this visit.    Allergies  Allergen Reactions   Amoxicillin Rash   Augmentin [Amoxicillin-Pot Clavulanate] Itching and Rash    Past Medical History:  Diagnosis Date   Allergy    [employer] 10/[name]/2021   Arthritis  [employer] 10/[name]/2021   Atrial fibrillation (HCC) 02/2013   paroxsymal; c/b Type 2 NSTEMI; CHADS2-VASc=2 (age, sex - patient had come off anti-HTN meds in past); Eliquis started;  Echocardiogram 03/10/13: EF 55-60%, grade 1 diastolic dysfunction, mild MR, mild LAE   CKD (chronic kidney disease)    3a   GERD (gastroesophageal reflux disease)    [employer] 10/[name]/2021   Hyperlipidemia    Hypertension    Myocardial infarction [facility])    [employer]ia 10/[name]/2021   NSTEMI (non-ST elevated myocardial infarction) (HCC) 02/2013   demand ischemia in setting of AFib with RVR; ETT-Myoview 03/11/13: No ischemia or scar, EF 70%.   PAF (paroxysmal atrial fibrillation) (HCC)     Past Surgical History:  Procedure Laterality Date   APPENDECTOMY     CATARACT EXTRACTION      CATARACT EXTRACTION, BILATERAL     EYE SURGERY     STRABISMUS SURGERY     TONSILLECTOMY     YAG LASER APPLICATION      Social History   Tobacco Use  Smoking Status Never  Smokeless Tobacco Never    Social History   Substance and Sexual Activity  Alcohol Use Not Currently    Family History  Problem Relation Age of Onset   Heart attack Mother 46   Cancer Maternal Aunt        throat   Colon cancer Neg Hx    Esophageal cancer Neg Hx    Stomach cancer Neg Hx     Review of Systems: The review of systems is per the HPI.  All other systems were reviewed and are negative.  Physical Exam: There were no vitals taken for this visit. GENERAL:  Well appearing WF in NAD HEENT:  PERRL, EOMI, sclera are clear. Oropharynx is clear. NECK:  No jugular venous distention, carotid upstroke brisk and symmetric, no bruits, no thyromegaly or adenopathy LUNGS:  Clear to auscultation bilaterally CHEST:  Unremarkable HEART:  RRR,  PMI not displaced or sustained,S1 and S2 within normal limits, no S3, no S4: no clicks, no rubs, no murmurs ABD:  Soft, nontender. BS +, no masses or bruits. No hepatomegaly, no splenomegaly EXT:  2 + pulses throughout, no edema, no cyanosis no clubbing SKIN:  Warm and dry.  No rashes NEURO:  Alert and oriented x 3. Cranial nerves II through XII intact. PSYCH:  Cognitively intact      LABORATORY DATA:  Lab Results  Component Value Date   WBC 8.0 04/07/2023   HGB 12.5 04/07/2023   HCT 38.5 04/07/2023   PLT 241 04/07/2023   GLUCOSE 90 04/07/2023   CHOL 146 06/12/2023   TRIG 91 06/12/2023   HDL 46 06/12/2023   LDLCALC 83 06/12/2023   ALT 25 07/29/2022   AST 35 07/29/2022   NA 139 04/07/2023   K 5.2 04/07/2023   CL 102 04/07/2023   CREATININE 1.09 (H) 04/07/2023   BUN 19 04/07/2023   CO2 26 04/07/2023   TSH 1.590 02/03/2023   INR 1.19 01/03/2014   HGBA1C 5.8 (H) 07/29/2022   Ecg done today: NSR with first degree AV block. Rate 64. Otherwise  normal. I have personally reviewed and interpreted this study.  Echo 03/18/23: IMPRESSIONS     1. Left ventricular ejection fraction, by estimation, is 60 to 65%. The  left ventricle has normal function. The left ventricle has no regional  wall motion abnormalities. There is mild left ventricular hypertrophy.  Left ventricular diastolic parameters  are consistent with Grade II diastolic dysfunction (  pseudonormalization).   2. Right ventricular systolic function is normal. The right ventricular  size is normal. There is normal pulmonary artery systolic pressure.   3. Left atrial size was moderately dilated.   4. The mitral valve is degenerative. Mild to moderate mitral valve  regurgitation.   5. Tricuspid valve regurgitation is mild to moderate.   6. The aortic valve is grossly normal. Aortic valve regurgitation is not  visualized.   7. The inferior vena cava is normal in size with greater than 50%  respiratory variability, suggesting right atrial pressure of 3 mmHg.   OVER-READ INTERPRETATION  PET-CT CHEST   The following report is an over-read performed by radiologist Dr. Leatha Gilding [facility] on 04/25/2023. This over-read does not include interpretation of cardiac or coronary anatomy or pathology. The cardiac CT interpretation by the cardiologist is to be attached.   COMPARISON:  None.   FINDINGS: Mild circumferential wall thickening noted distal esophagus with tiny hiatal hernia evident.   6 mm posterior right middle lobe pulmonary nodule identified on image 18/11.   4 mm low-density lesion in the dome of the liver is too small to characterize but is statistically most likely benign. No followup imaging is recommended.   No suspicious lytic or sclerotic osseous abnormality.   IMPRESSION: 1. 6 mm posterior right middle lobe pulmonary nodule. Non-contrast chest CT at 6-12 months is recommended. If the nodule is stable at time of repeat CT, then future CT  at 18-24 months (from today's scan) is considered optional for low-risk patients, but is recommended for high-risk patients. This recommendation follows the consensus statement: Guidelines for Management of Incidental Pulmonary Nodules Detected on CT Images: From the Fleischner Society 2017; Radiology 2017; 284:228-243. 2. Mild circumferential wall thickening distal esophagus with tiny hiatal hernia. Correlation for symptoms of esophagitis recommended.   These results will be called to the ordering clinician or representative by the Radiologist Assistant, and communication documented in the PACS or [employer].     Electronically Signed   By: Kennith Center M.D.   On: 04/25/2023 07:44    Addended by Kennith Center, MD on 04/25/2023  7:46 AM    Study Result  Narrative & Impression  CLINICAL DATA:  83 yo female with DOE   EXAM: Cardiac/Coronary CTA   TECHNIQUE: A non-contrast, gated CT scan was obtained with axial slices of 3 mm through the heart for calcium scoring. Calcium scoring was performed using the Agatston method. A 120 kV prospective, gated, contrast cardiac scan was obtained. Gantry rotation speed was 250 msecs and collimation was 0.6 mm. Two sublingual nitroglycerin tablets (0.8 mg) were given. The 3D data set was reconstructed in 5% intervals of the 35-75% of the R-R cycle. Diastolic phases were analyzed on a dedicated workstation using MPR, MIP, and VRT modes. The patient received 95 cc of contrast.   FINDINGS: Image quality: Excellent.   Noise artifact is: Limited.   Coronary Arteries:  Normal coronary origin.  Right dominance.   Left main: The left main is a large caliber vessel with a normal take off from the left coronary cusp that bifurcates to form a left anterior descending artery and a left circumflex artery. There is no plaque or stenosis.   Left anterior descending artery: The LAD has minimal (0-24) calcified plaque in the proximal and mid  vessel. The LAD gives off 1 small diagonal with mild (25-49) ostial stenosis and mild (25-49) plaque in the mid vessel.   Left circumflex artery:  The LCX is non-dominant and has minimal (0-24) plaque in the proximal vessel and mild (25-49) plaque in the mid vessel. The LCX gives off 2 large, patent obtuse marginal branches.   Right coronary artery: The RCA is dominant with normal take off from the right coronary cusp. There is mild (25-49) plaque in the proximal to mid vessel. The RCA terminates as a PDA and right posterolateral branch without evidence of plaque or stenosis.   Right Atrium: Right atrial size is within normal limits.   Right Ventricle: The right ventricular cavity is within normal limits.   Left Atrium: Left atrial size is enlarged; cannot R/O thrombus at left atrial appendage tip.   Left Ventricle: The ventricular cavity size is within normal limits.   Pulmonary arteries: Normal in size.   Pulmonary veins: Normal pulmonary venous drainage.   Pericardium: Normal thickness without significant effusion or calcium present.   Cardiac valves: The aortic valve is trileaflet without significant calcification. The mitral valve is normal; significant mitral annular calcification.   Aorta: Normal caliber with aortic atherosclerosis.   Extra-cardiac findings: See attached radiology report for non-cardiac structures.   IMPRESSION: 1. Coronary calcium score of 661. This was 19 percentile for age-, sex, and race-matched controls.   2. Total plaque volume 577 mm3 which is 59 percentile for age- and sex-matched controls (calcified plaque 128 mm3; non-calcified plaque 449 mm3). TPV is (severe).   3. Normal coronary origin with right dominance.   4. Mild coronary artery disease in the LAD, diagonal, Lcx and RCA as outlined.   5. Aortic atherosclerosis.   6. LAE; cannot R/O left atrial appendage thrombus.   RECOMMENDATIONS: CAD-RADS 2: Mild non-obstructive CAD  (25-49%). Consider non-atherosclerotic causes of chest pain. Consider preventive therapy and risk factor modification.   Olga Millers, MD       Assessment / Plan: 1.  PAF - Maintaining NSR. Continue metoprolol and Eliquis. Meds refilled today  2.  Chronic anticoagulation- tolerating well. Due for follow up lab work with PCP  3. HTN - BP is well controlled.  4. HLD. Last LDL 110 on 10 mg lipitor.   5. DOE ? Secondary to diastolic dysfunction. Normal EF. Mod MR  6. CAD with high coronary calcium score. Nonobstructive disease  by CTA   I will follow up in one year

## 2023-10-07 ENCOUNTER — Ambulatory Visit: Payer: Medicare Other | Admitting: Cardiology

## 2023-10-14 DIAGNOSIS — M9904 Segmental and somatic dysfunction of sacral region: Secondary | ICD-10-CM | POA: Diagnosis not present

## 2023-10-14 DIAGNOSIS — M9903 Segmental and somatic dysfunction of lumbar region: Secondary | ICD-10-CM | POA: Diagnosis not present

## 2023-10-14 DIAGNOSIS — M9902 Segmental and somatic dysfunction of thoracic region: Secondary | ICD-10-CM | POA: Diagnosis not present

## 2023-10-14 DIAGNOSIS — M47814 Spondylosis without myelopathy or radiculopathy, thoracic region: Secondary | ICD-10-CM | POA: Diagnosis not present

## 2023-10-14 DIAGNOSIS — M47897 Other spondylosis, lumbosacral region: Secondary | ICD-10-CM | POA: Diagnosis not present

## 2023-10-14 DIAGNOSIS — M4726 Other spondylosis with radiculopathy, lumbar region: Secondary | ICD-10-CM | POA: Diagnosis not present

## 2023-10-20 NOTE — Progress Notes (Signed)
 Beva C Few Date of Birth: 1941-01-16 Medical Record #[id number]  History of Present Illness: Ms. Dumoulin is seen for follow up of atrial fibrillation.  She has a history of HTN, paroxysmal Afib with RVR. Admitted in 2014. Had demand ischemia. Myoview study was normal.  Echo from [facility] of 2014 with EF of 55 to 60% and grade 1 diastolic dysfunction. She has been on chronic therapy with metoprolol  and Eliquis .   She was seen this past summer by Marcie Sever PA-C with symptoms of DOE. Echo showed gr 2 diastolic dysfunction with normal EF. Mod MR, mild-mod TR. BNP 151. Coronary CTA showed a high calcium  score of 631 but mild nonobstructive CAD. There was ? LA appendage thrombus. Patient already on Eliquis . Pulmonary nodule noted with recommendation for follow up CT at 6-12 months. Mild thickening of esophagus.   On follow up today she still notes she gets SOB and some RUQ tightness with exertion- walking up hill or lifting. No edema. Weight is stable.    Current Outpatient Medications  Medication Sig Dispense Refill   acetaminophen  (TYLENOL ) 500 MG tablet Take 500 mg by mouth daily as needed for pain.     apixaban  (ELIQUIS ) 5 MG TABS tablet TAKE 1 TABLET BY MOUTH 2 TIMES DAILY. 60 tablet 5   Biotin 1 MG CAPS Take 1 tablet by mouth at bedtime.     Calcium  Carb-Cholecalciferol  (CALCIUM  + D3) 600-200 MG-UNIT TABS Take 1 tablet by mouth daily.     Cholecalciferol  (VITAMIN D ) 400 UNITS capsule Take 400 Units by mouth daily.     Docusate Calcium  (STOOL SOFTENER PO) Take by mouth at bedtime.     ELDERBERRY PO Take by mouth.     famotidine  (PEPCID ) 20 MG tablet Take 1 tablet (20 mg total) by mouth daily. 30 tablet 1   fish oil-omega-3 fatty acids  1000 MG capsule Take 1 g by mouth daily.     Flaxseed, Linseed, (FLAX SEED OIL PO) Take by mouth daily.     fluticasone  (FLONASE ) 50 MCG/ACT nasal spray Place 2 sprays into both nostrils daily. 16 g 6   hydrochlorothiazide  (HYDRODIURIL ) 12.5 MG tablet TAKE 1  TABLET (12.5 MG TOTAL) BY MOUTH DAILY. 90 tablet 1   losartan  (COZAAR ) 50 MG tablet TAKE 1 TABLET (50 MG TOTAL) BY MOUTH DAILY. 90 tablet 1   Magnesium 300 MG CAPS Take 1 capsule by mouth daily.     methylcellulose (CITRUCEL) oral powder Use as directed daily     metoprolol  tartrate (LOPRESSOR ) 50 MG tablet TAKE 1 TABLET BY MOUTH IN THE MORNING AND TAKE 1/2 TABLET IN THE AFTERNOON 135 tablet 2   polyethylene glycol (MIRALAX ) 17 g packet Take 17 g by mouth daily as needed.     Probiotic Product (PROBIOTIC DAILY PO) Take 1 tablet by mouth daily as needed.      vitamin C  (ASCORBIC ACID ) 500 MG tablet Take 500 mg by mouth daily.     atorvastatin  (LIPITOR) 20 MG tablet Take 1 tablet (20 mg total) by mouth daily. (Patient taking differently: Take 20 mg by mouth daily. Pt taking 10 mg a day) 90 tablet 3   No current facility-administered medications for this visit.    Allergies  Allergen Reactions   Amoxicillin Rash   Augmentin [Amoxicillin-Pot Clavulanate] Itching and Rash    Past Medical History:  Diagnosis Date   Allergy    Phreesia 07/04/2020   Arthritis    Phreesia 07/04/2020   Atrial fibrillation (HCC) 02/2013  paroxsymal; c/b Type 2 NSTEMI; CHADS2-VASc=2 (age, sex - patient had come off anti-HTN meds in past); Eliquis  started;  Echocardiogram 03/10/13: EF 55-60%, grade 1 diastolic dysfunction, mild MR, mild LAE   CKD (chronic kidney disease)    3a   GERD (gastroesophageal reflux disease)    Phreesia 07/04/2020   Hyperlipidemia    Hypertension    Myocardial infarction [facility])    Phreesia 07/04/2020   NSTEMI (non-ST elevated myocardial infarction) (HCC) 02/2013   demand ischemia in setting of AFib with RVR; ETT-Myoview 03/11/13: No ischemia or scar, EF 70%.   PAF (paroxysmal atrial fibrillation) (HCC)     Past Surgical History:  Procedure Laterality Date   APPENDECTOMY     CATARACT EXTRACTION     CATARACT EXTRACTION, BILATERAL     EYE SURGERY     STRABISMUS SURGERY      TONSILLECTOMY     YAG LASER APPLICATION      Social History   Tobacco Use  Smoking Status Never  Smokeless Tobacco Never    Social History   Substance and Sexual Activity  Alcohol Use Not Currently    Family History  Problem Relation Age of Onset   Heart attack Mother 36   Cancer Maternal Aunt        throat   Colon cancer Neg Hx    Esophageal cancer Neg Hx    Stomach cancer Neg Hx     Review of Systems: The review of systems is per the HPI.  All other systems were reviewed and are negative.  Physical Exam: BP 120/60   Pulse 61   Ht 5\' 3"  (1.6 m)   Wt 166 lb 12.8 oz (75.7 kg)   SpO2 97%   BMI 29.55 kg/m  GENERAL:  Well appearing WF in NAD HEENT:  PERRL, EOMI, sclera are clear. Oropharynx is clear. NECK:  No jugular venous distention, carotid upstroke brisk and symmetric, no bruits, no thyromegaly or adenopathy LUNGS:  Clear to auscultation bilaterally CHEST:  Unremarkable HEART:  RRR,  PMI not displaced or sustained,S1 and S2 within normal limits, no S3, no S4: no clicks, no rubs, soft 1/6 systolic murmur LSB ABD:  Soft, nontender. BS +, no masses or bruits. No hepatomegaly, no splenomegaly EXT:  2 + pulses throughout, no edema, no cyanosis no clubbing SKIN:  Warm and dry.  No rashes NEURO:  Alert and oriented x 3. Cranial nerves II through XII intact. PSYCH:  Cognitively intact      LABORATORY DATA:  Lab Results  Component Value Date   WBC 8.0 04/07/2023   HGB 12.5 04/07/2023   HCT 38.5 04/07/2023   PLT 241 04/07/2023   GLUCOSE 90 04/07/2023   CHOL 146 06/12/2023   TRIG 91 06/12/2023   HDL 46 06/12/2023   LDLCALC 83 06/12/2023   ALT 25 07/29/2022   AST 35 07/29/2022   NA 139 04/07/2023   K 5.2 04/07/2023   CL 102 04/07/2023   CREATININE 1.09 (H) 04/07/2023   BUN 19 04/07/2023   CO2 26 04/07/2023   TSH 1.590 02/03/2023   INR 1.19 01/03/2014   HGBA1C 5.8 (H) 07/29/2022   Ecg done today: NSR with first degree AV block. Rate 64. Otherwise  normal. I have personally reviewed and interpreted this study.  Echo 03/18/23: IMPRESSIONS     1. Left ventricular ejection fraction, by estimation, is 60 to 65%. The  left ventricle has normal function. The left ventricle has no regional  wall motion abnormalities. There is  mild left ventricular hypertrophy.  Left ventricular diastolic parameters  are consistent with Grade II diastolic dysfunction (pseudonormalization).   2. Right ventricular systolic function is normal. The right ventricular  size is normal. There is normal pulmonary artery systolic pressure.   3. Left atrial size was moderately dilated.   4. The mitral valve is degenerative. Mild to moderate mitral valve  regurgitation.   5. Tricuspid valve regurgitation is mild to moderate.   6. The aortic valve is grossly normal. Aortic valve regurgitation is not  visualized.   7. The inferior vena cava is normal in size with greater than 50%  respiratory variability, suggesting right atrial pressure of 3 mmHg.   OVER-READ INTERPRETATION  PET-CT CHEST   The following report is an over-read performed by radiologist Dr. Kasandra Pain [facility] on 04/25/2023. This over-read does not include interpretation of cardiac or coronary anatomy or pathology. The cardiac CT interpretation by the cardiologist is to be attached.   COMPARISON:  None.   FINDINGS: Mild circumferential wall thickening noted distal esophagus with tiny hiatal hernia evident.   6 mm posterior right middle lobe pulmonary nodule identified on image 18/11.   4 mm low-density lesion in the dome of the liver is too small to characterize but is statistically most likely benign. No followup imaging is recommended.   No suspicious lytic or sclerotic osseous abnormality.   IMPRESSION: 1. 6 mm posterior right middle lobe pulmonary nodule. Non-contrast chest CT at 6-12 months is recommended. If the nodule is stable at time of repeat CT, then future CT  at 18-24 months (from today's scan) is considered optional for low-risk patients, but is recommended for high-risk patients. This recommendation follows the consensus statement: Guidelines for Management of Incidental Pulmonary Nodules Detected on CT Images: From the Fleischner Society 2017; Radiology 2017; 284:228-243. 2. Mild circumferential wall thickening distal esophagus with tiny hiatal hernia. Correlation for symptoms of esophagitis recommended.   These results will be called to the ordering clinician or representative by the Radiologist Assistant, and communication documented in the PACS or [employer].     Electronically Signed   By: Donnal Fusi M.D.   On: 04/25/2023 07:44    Addended by Donnal Fusi, MD on 04/25/2023  7:46 AM    Study Result  Narrative & Impression  CLINICAL DATA:  83 yo female with DOE   EXAM: Cardiac/Coronary CTA   TECHNIQUE: A non-contrast, gated CT scan was obtained with axial slices of 3 mm through the heart for calcium  scoring. Calcium  scoring was performed using the Agatston method. A 120 kV prospective, gated, contrast cardiac scan was obtained. Gantry rotation speed was 250 msecs and collimation was 0.6 mm. Two sublingual nitroglycerin  tablets (0.8 mg) were given. The 3D data set was reconstructed in 5% intervals of the 35-75% of the R-R cycle. Diastolic phases were analyzed on a dedicated workstation using MPR, MIP, and VRT modes. The patient received 95 cc of contrast.   FINDINGS: Image quality: Excellent.   Noise artifact is: Limited.   Coronary Arteries:  Normal coronary origin.  Right dominance.   Left main: The left main is a large caliber vessel with a normal take off from the left coronary cusp that bifurcates to form a left anterior descending artery and a left circumflex artery. There is no plaque or stenosis.   Left anterior descending artery: The LAD has minimal (0-24) calcified plaque in the proximal and mid  vessel. The LAD gives off 1 small diagonal with  mild (25-49) ostial stenosis and mild (25-49) plaque in the mid vessel.   Left circumflex artery: The LCX is non-dominant and has minimal (0-24) plaque in the proximal vessel and mild (25-49) plaque in the mid vessel. The LCX gives off 2 large, patent obtuse marginal branches.   Right coronary artery: The RCA is dominant with normal take off from the right coronary cusp. There is mild (25-49) plaque in the proximal to mid vessel. The RCA terminates as a PDA and right posterolateral branch without evidence of plaque or stenosis.   Right Atrium: Right atrial size is within normal limits.   Right Ventricle: The right ventricular cavity is within normal limits.   Left Atrium: Left atrial size is enlarged; cannot R/O thrombus at left atrial appendage tip.   Left Ventricle: The ventricular cavity size is within normal limits.   Pulmonary arteries: Normal in size.   Pulmonary veins: Normal pulmonary venous drainage.   Pericardium: Normal thickness without significant effusion or calcium  present.   Cardiac valves: The aortic valve is trileaflet without significant calcification. The mitral valve is normal; significant mitral annular calcification.   Aorta: Normal caliber with aortic atherosclerosis.   Extra-cardiac findings: See attached radiology report for non-cardiac structures.   IMPRESSION: 1. Coronary calcium  score of 661. This was 3 percentile for age-, sex, and race-matched controls.   2. Total plaque volume 577 mm3 which is 59 percentile for age- and sex-matched controls (calcified plaque 128 mm3; non-calcified plaque 449 mm3). TPV is (severe).   3. Normal coronary origin with right dominance.   4. Mild coronary artery disease in the LAD, diagonal, Lcx and RCA as outlined.   5. Aortic atherosclerosis.   6. LAE; cannot R/O left atrial appendage thrombus.   RECOMMENDATIONS: CAD-RADS 2: Mild non-obstructive CAD  (25-49%). Consider non-atherosclerotic causes of chest pain. Consider preventive therapy and risk factor modification.   Alexandria Angel, MD       Assessment / Plan: 1.  PAF - Maintaining NSR. Continue metoprolol  and Eliquis .   2.  Chronic diastolic CHF. Related to age, AFib, HTN. Moderate MR on Echo but exam is pretty unremarkable. BP is optimal she is on HCT. Euvolemic. Will try Jardiance  10 mg daily to see if this will help with symptoms. Low sodium diet. Continue regular exercise.   3. HTN - BP is well controlled.  4. HLD. Last LDL 83 on lipitor 10 mg every other day. Higher doses result in arthralgias.   5. Pulmonary nodule. Follow up CT in process.   6. CAD with high coronary calcium  score. Nonobstructive disease  by CTA. Risk factor modification   I will follow up in 6 months

## 2023-10-22 ENCOUNTER — Ambulatory Visit (HOSPITAL_COMMUNITY)
Admission: RE | Admit: 2023-10-22 | Discharge: 2023-10-22 | Disposition: A | Discharge status: Home / Self Care | Payer: Medicare Other | Source: Ambulatory Visit | Attending: Cardiology | Admitting: Cardiology

## 2023-10-22 DIAGNOSIS — R911 Solitary pulmonary nodule: Secondary | ICD-10-CM | POA: Diagnosis not present

## 2023-10-22 DIAGNOSIS — R918 Other nonspecific abnormal finding of lung field: Secondary | ICD-10-CM | POA: Diagnosis not present

## 2023-10-22 DIAGNOSIS — I700 Atherosclerosis of aorta: Secondary | ICD-10-CM | POA: Diagnosis not present

## 2023-10-26 ENCOUNTER — Encounter: Payer: Self-pay | Admitting: Cardiology

## 2023-10-26 ENCOUNTER — Ambulatory Visit: Payer: Medicare Other | Attending: Cardiology | Admitting: Cardiology

## 2023-10-26 VITALS — BP 120/60 | HR 61 | Ht 63.0 in | Wt 166.8 lb

## 2023-10-26 DIAGNOSIS — I480 Paroxysmal atrial fibrillation: Secondary | ICD-10-CM

## 2023-10-26 DIAGNOSIS — I2510 Atherosclerotic heart disease of native coronary artery without angina pectoris: Secondary | ICD-10-CM

## 2023-10-26 DIAGNOSIS — R0609 Other forms of dyspnea: Secondary | ICD-10-CM | POA: Diagnosis not present

## 2023-10-26 DIAGNOSIS — I10 Essential (primary) hypertension: Secondary | ICD-10-CM | POA: Diagnosis not present

## 2023-10-26 DIAGNOSIS — I38 Endocarditis, valve unspecified: Secondary | ICD-10-CM

## 2023-10-26 DIAGNOSIS — I5032 Chronic diastolic (congestive) heart failure: Secondary | ICD-10-CM

## 2023-10-26 MED ORDER — EMPAGLIFLOZIN 10 MG PO TABS: 10.0000 mg | ORAL_TABLET | Freq: Every day | ORAL | 6 refills | Status: DC

## 2023-10-26 MED ORDER — APIXABAN 5 MG PO TABS: 5.0000 mg | ORAL_TABLET | Freq: Two times a day (BID) | ORAL | 0 refills | Status: DC

## 2023-10-26 MED ORDER — EMPAGLIFLOZIN 10 MG PO TABS: 10.0000 mg | ORAL_TABLET | Freq: Every day | ORAL | 0 refills | Status: DC

## 2023-10-26 NOTE — Patient Instructions (Signed)
 Medication Instructions:  Start Jardiance  10 mg every morning before breakfast Continue all other medications *If you need a refill on your cardiac medications before your next appointment, please call your pharmacy*   Lab Work: None ordered   Testing/Procedures: None ordered   Follow-Up: At [facility], you and your health needs are our priority.  As part of our continuing mission to provide you with exceptional heart care, we have created designated Provider Care Teams.  These Care Teams include your primary Cardiologist (physician) and Advanced Practice Providers (APPs -  Physician Assistants and Nurse Practitioners) who all work together to provide you with the care you need, when you need it.  We recommend signing up for the patient portal called "MyChart".  Sign up information is provided on this After Visit Summary.  MyChart is used to connect with patients for Virtual Visits (Telemedicine).  Patients are able to view lab/test results, encounter notes, upcoming appointments, etc.  Non-urgent messages can be sent to your provider as well.   To learn more about what you can do with MyChart, go to [email or web address].    Your next appointment:  6 months  Call in April to schedule August appointment     Provider:  Dr.Jordan

## 2023-10-26 NOTE — Addendum Note (Signed)
 Addended by: Sherryn Donalds on: 10/26/2023 11:51 AM   Modules accepted: Orders

## 2023-11-12 DIAGNOSIS — M47816 Spondylosis without myelopathy or radiculopathy, lumbar region: Secondary | ICD-10-CM | POA: Diagnosis not present

## 2023-11-12 DIAGNOSIS — M9903 Segmental and somatic dysfunction of lumbar region: Secondary | ICD-10-CM | POA: Diagnosis not present

## 2023-11-12 DIAGNOSIS — M9902 Segmental and somatic dysfunction of thoracic region: Secondary | ICD-10-CM | POA: Diagnosis not present

## 2023-11-12 DIAGNOSIS — M9901 Segmental and somatic dysfunction of cervical region: Secondary | ICD-10-CM | POA: Diagnosis not present

## 2023-11-12 DIAGNOSIS — M47814 Spondylosis without myelopathy or radiculopathy, thoracic region: Secondary | ICD-10-CM | POA: Diagnosis not present

## 2023-11-12 DIAGNOSIS — M47892 Other spondylosis, cervical region: Secondary | ICD-10-CM | POA: Diagnosis not present

## 2023-12-04 ENCOUNTER — Telehealth: Payer: Self-pay

## 2023-12-04 NOTE — Telephone Encounter (Signed)
 LVM for return call to reschedule appt on 01/01/24 as the office is closed for Good Friday.

## 2023-12-08 NOTE — Telephone Encounter (Signed)
 2nd attempt to reach pt.  LVM to call office to reschedule appt. Sent MyChart message as well.

## 2023-12-09 DIAGNOSIS — M47816 Spondylosis without myelopathy or radiculopathy, lumbar region: Secondary | ICD-10-CM | POA: Diagnosis not present

## 2023-12-09 DIAGNOSIS — M9901 Segmental and somatic dysfunction of cervical region: Secondary | ICD-10-CM | POA: Diagnosis not present

## 2023-12-09 DIAGNOSIS — M47814 Spondylosis without myelopathy or radiculopathy, thoracic region: Secondary | ICD-10-CM | POA: Diagnosis not present

## 2023-12-09 DIAGNOSIS — M9902 Segmental and somatic dysfunction of thoracic region: Secondary | ICD-10-CM | POA: Diagnosis not present

## 2023-12-09 DIAGNOSIS — M9903 Segmental and somatic dysfunction of lumbar region: Secondary | ICD-10-CM | POA: Diagnosis not present

## 2023-12-09 DIAGNOSIS — M47892 Other spondylosis, cervical region: Secondary | ICD-10-CM | POA: Diagnosis not present

## 2023-12-21 ENCOUNTER — Other Ambulatory Visit: Payer: Self-pay | Admitting: Cardiology

## 2023-12-21 DIAGNOSIS — I4891 Unspecified atrial fibrillation: Secondary | ICD-10-CM

## 2023-12-21 NOTE — Telephone Encounter (Signed)
 Prescription refill request for Eliquis received. Indication: PAF Last office visit: 10/26/23  P [place] MD Scr: 1.09 on 04/07/23  Epic Age: 83 Weight: 75.7kg  Based on above findings Eliquis 5mg  twice daily is the appropriate dose.  Refill approved.

## 2023-12-30 DIAGNOSIS — M47816 Spondylosis without myelopathy or radiculopathy, lumbar region: Secondary | ICD-10-CM | POA: Diagnosis not present

## 2023-12-30 DIAGNOSIS — M9901 Segmental and somatic dysfunction of cervical region: Secondary | ICD-10-CM | POA: Diagnosis not present

## 2023-12-30 DIAGNOSIS — M47892 Other spondylosis, cervical region: Secondary | ICD-10-CM | POA: Diagnosis not present

## 2023-12-30 DIAGNOSIS — M9902 Segmental and somatic dysfunction of thoracic region: Secondary | ICD-10-CM | POA: Diagnosis not present

## 2023-12-30 DIAGNOSIS — M9903 Segmental and somatic dysfunction of lumbar region: Secondary | ICD-10-CM | POA: Diagnosis not present

## 2023-12-30 DIAGNOSIS — M47814 Spondylosis without myelopathy or radiculopathy, thoracic region: Secondary | ICD-10-CM | POA: Diagnosis not present

## 2024-01-01 ENCOUNTER — Ambulatory Visit: Payer: Medicare Other | Admitting: Family Medicine

## 2024-01-11 ENCOUNTER — Ambulatory Visit (INDEPENDENT_AMBULATORY_CARE_PROVIDER_SITE_OTHER): Admitting: Family Medicine

## 2024-01-11 ENCOUNTER — Encounter: Payer: Self-pay | Admitting: Family Medicine

## 2024-01-11 VITALS — BP 127/82 | HR 58 | Ht 63.0 in | Wt 163.4 lb

## 2024-01-11 DIAGNOSIS — R7303 Prediabetes: Secondary | ICD-10-CM

## 2024-01-11 DIAGNOSIS — I5032 Chronic diastolic (congestive) heart failure: Secondary | ICD-10-CM | POA: Diagnosis not present

## 2024-01-11 DIAGNOSIS — R911 Solitary pulmonary nodule: Secondary | ICD-10-CM

## 2024-01-11 DIAGNOSIS — I482 Chronic atrial fibrillation, unspecified: Secondary | ICD-10-CM | POA: Diagnosis not present

## 2024-01-11 DIAGNOSIS — E782 Mixed hyperlipidemia: Secondary | ICD-10-CM | POA: Diagnosis not present

## 2024-01-11 DIAGNOSIS — N1831 Chronic kidney disease, stage 3a: Secondary | ICD-10-CM

## 2024-01-11 DIAGNOSIS — I2510 Atherosclerotic heart disease of native coronary artery without angina pectoris: Secondary | ICD-10-CM

## 2024-01-11 DIAGNOSIS — R0609 Other forms of dyspnea: Secondary | ICD-10-CM

## 2024-01-11 DIAGNOSIS — I5030 Unspecified diastolic (congestive) heart failure: Secondary | ICD-10-CM | POA: Insufficient documentation

## 2024-01-11 DIAGNOSIS — I10 Essential (primary) hypertension: Secondary | ICD-10-CM

## 2024-01-11 MED ORDER — EMPAGLIFLOZIN 10 MG PO TABS: 10.0000 mg | ORAL_TABLET | Freq: Every day | ORAL | 3 refills | Status: AC

## 2024-01-11 MED ORDER — ATORVASTATIN CALCIUM 10 MG PO TABS: 10.0000 mg | ORAL_TABLET | Freq: Every day | ORAL | 3 refills | Status: AC

## 2024-01-11 NOTE — Patient Instructions (Signed)
 It was nice to see you today,  We addressed the following topics today: -I have sent in your Lipitor as 10 mg. - I have sent in your Jardiance  as 90-day supplies - I would recommend trying over-the-counter co-Q10 200 mg a day.  This can help with muscle aches from statin use.  Even though you were taking the 10 mg of statin, it still may help with some muscle cramps.  If it does not you can always just stop taking it.  Have a great day,  Etha Henle, MD

## 2024-01-11 NOTE — Assessment & Plan Note (Signed)
Rechecking CMP today. 

## 2024-01-11 NOTE — Assessment & Plan Note (Signed)
 Rechecking A1c today

## 2024-01-11 NOTE — Assessment & Plan Note (Signed)
 Follows with cardiology.  Recently started on Jardiance .  Was been taking it every other day due to loose bowel movements she presumed to be due to the medicine.  She is okay with trying it daily now.  Also taking metoprolol  and losartan .

## 2024-01-11 NOTE — Assessment & Plan Note (Signed)
Controlled today.  Continue current medications

## 2024-01-11 NOTE — Progress Notes (Signed)
 Established Patient Office Visit  Subjective   Patient ID: Kelli [name], female    DOB: 03/30/1941  Age: 83 y.o. MRN: [id number]  Chief Complaint  Patient presents with   Medical Management of Chronic Issues    HPI  Subjective: - Presents for follow-up of multiple chronic conditions - Reports shortness of breath with exertion, improved somewhat - Holds leg when walking uphill which helps with breathing - No leg pain with walking  - Reports indigestion, endoscopy negative  - Reports loose stools since starting Jardiance   - Reports muscle aches with higher dose statin - Has not tried co-Q10 yet.  PMHx:  - Atrial fibrillation - Diastolic heart failure (preserved ejection fraction) - Lung nodule  Medications: - Jardiance  (taking every other day due to GI side effects) - Atorvastatin  10mg  daily (was cutting 20mg  tablets) - Fish oil - Flaxseed oil  Review of Systems: - Cardiovascular: Shortness of breath with exertion, improved somewhat - Respiratory: Shortness of breath with exertion, improved somewhat - Gastrointestinal: Loose stools with Jardiance , history of constipation, indigestion - Genitourinary: Denies history of UTIs - Musculoskeletal: Muscle aches with higher dose statin   The ASCVD Risk score (Arnett DK, et al., 2019) failed to calculate for the following reasons:   The 2019 ASCVD risk score is only valid for ages 29 to 12   Risk score cannot be calculated because patient has a medical history suggesting prior/existing ASCVD  Health Maintenance Due  Topic Date Due   Zoster Vaccines- Shingrix (1 of 2) Never done   Pneumonia Vaccine 37+ Years old (2 of 2 - PCV) 06/12/2010   COVID-19 Vaccine (3 - Moderna risk series) 12/06/2019   DTaP/Tdap/Td (2 - Td or Tdap) 07/15/2021      Objective:     BP 127/82   Pulse (!) 58   Ht 5\' 3"  (1.6 m)   Wt 163 lb 6.4 oz (74.1 kg)   SpO2 100%   BMI 28.95 kg/m    Physical Exam General: Alert, oriented CV:  Regular rate and rhythm Pulmonary: Lungs clear bilaterally no wheeze or crackles    No results found for any visits on 01/11/24.      Assessment & Plan:   Dyspnea on exertion Assessment & Plan: At this point we will hold off on any further workup.  She thinks it is mildly better.  May have been improved due to the Jardiance  addition.  Has had extensive cardiac workup.  She is on medications for A-fib and heart failure.  If it was due to an underlying restrictive or obstructive pulmonary disorder it is likely something would have shown up on her multiple CT scans by now.  Will hold off on spirometry for now.   Lung nodule Assessment & Plan: 10/2023 CT scan showed stable nodules.  Recommended 12 to 66-month follow-up.   Chronic diastolic congestive heart failure [facility]) Assessment & Plan: Follows with cardiology.  Recently started on Jardiance .  Was been taking it every other day due to loose bowel movements she presumed to be due to the medicine.  She is okay with trying it daily now.  Also taking metoprolol  and losartan .   Essential hypertension Assessment & Plan: Controlled today.  Continue current medications.   Chronic atrial fibrillation (HCC) Assessment & Plan: - Chronic condition, rate controlled - Continue current management   Stage 3a chronic kidney disease (HCC) Assessment & Plan: Rechecking CMP today.  Orders: -     Comprehensive metabolic panel with GFR  Mixed  hyperlipidemia Assessment & Plan: Atorvastatin  10 mg.  Intolerant of higher doses.  Rechecking lipid panel today.  Recommended co-Q10 trial.  Orders: -     Lipid panel  Prediabetes Assessment & Plan: Rechecking A1c today.  Orders: -     Hemoglobin A1c  Coronary artery disease involving native coronary artery of native heart without angina pectoris -     Atorvastatin  Calcium ; Take 1 tablet (10 mg total) by mouth daily.  Dispense: 90 tablet; Refill: 3  Other orders -     Empagliflozin ; Take 1  tablet (10 mg total) by mouth daily before breakfast.  Dispense: 90 tablet; Refill: 3     Return in about 6 months (around 07/12/2024) for HTN, hld.    Laneta Pintos, MD

## 2024-01-11 NOTE — Assessment & Plan Note (Signed)
 10/2023 CT scan showed stable nodules.  Recommended 12 to 71-month follow-up.

## 2024-01-11 NOTE — Assessment & Plan Note (Signed)
-   Chronic condition, rate controlled - Continue current management

## 2024-01-11 NOTE — Assessment & Plan Note (Signed)
 Atorvastatin  10 mg.  Intolerant of higher doses.  Rechecking lipid panel today.  Recommended co-Q10 trial.

## 2024-01-11 NOTE — Assessment & Plan Note (Signed)
 At this point we will hold off on any further workup.  She thinks it is mildly better.  May have been improved due to the Jardiance  addition.  Has had extensive cardiac workup.  She is on medications for A-fib and heart failure.  If it was due to an underlying restrictive or obstructive pulmonary disorder it is likely something would have shown up on her multiple CT scans by now.  Will hold off on spirometry for now.

## 2024-01-12 LAB — LIPID PANEL
Chol/HDL Ratio: 3.1 ratio (ref 0.0–4.4)
Cholesterol, Total: 151 mg/dL (ref 100–199)
HDL: 48 mg/dL (ref >39)
LDL Chol Calc (NIH): 86 mg/dL (ref 0–99)
Triglycerides: 90 mg/dL (ref 0–149)
VLDL Cholesterol Cal: 17 mg/dL (ref 5–40)

## 2024-01-12 LAB — COMPREHENSIVE METABOLIC PANEL WITH GFR
ALT: 25 IU/L (ref 0–32)
AST: 32 IU/L (ref 0–40)
Albumin: 4 g/dL (ref 3.7–4.7)
Alkaline Phosphatase: 92 IU/L (ref 44–121)
BUN/Creatinine Ratio: 23 (ref 12–28)
BUN: 28 mg/dL — ABNORMAL HIGH (ref 8–27)
Bilirubin Total: 0.5 mg/dL (ref 0.0–1.2)
CO2: 22 mmol/L (ref 20–29)
Calcium: 8.9 mg/dL (ref 8.7–10.3)
Chloride: 107 mmol/L — ABNORMAL HIGH (ref 96–106)
Creatinine, Ser: 1.2 mg/dL — ABNORMAL HIGH (ref 0.57–1.00)
Globulin, Total: 2.9 g/dL (ref 1.5–4.5)
Glucose: 90 mg/dL (ref 70–99)
Potassium: 5 mmol/L (ref 3.5–5.2)
Sodium: 142 mmol/L (ref 134–144)
Total Protein: 6.9 g/dL (ref 6.0–8.5)
eGFR: 45 mL/min/{1.73_m2} — ABNORMAL LOW (ref >59)

## 2024-01-12 LAB — HEMOGLOBIN A1C
Est. average glucose Bld gHb Est-mCnc: 120 mg/dL
Hgb A1c MFr Bld: 5.8 % — ABNORMAL HIGH (ref 4.8–5.6)

## 2024-01-13 ENCOUNTER — Encounter: Payer: Self-pay | Admitting: Family Medicine

## 2024-01-26 ENCOUNTER — Telehealth (INDEPENDENT_AMBULATORY_CARE_PROVIDER_SITE_OTHER): Admitting: Family Medicine

## 2024-01-26 DIAGNOSIS — R197 Diarrhea, unspecified: Secondary | ICD-10-CM | POA: Insufficient documentation

## 2024-01-26 MED ORDER — SULFAMETHOXAZOLE-TRIMETHOPRIM 800-160 MG PO TABS: 1.0000 | ORAL_TABLET | Freq: Two times a day (BID) | ORAL | 0 refills | Status: AC

## 2024-01-26 MED ORDER — METRONIDAZOLE 500 MG PO TABS: 500.0000 mg | ORAL_TABLET | Freq: Three times a day (TID) | ORAL | 0 refills | Status: AC

## 2024-01-26 NOTE — Assessment & Plan Note (Signed)
 Possibly mild diverticulitis.  Also consider infectious, C. difficile less likely given the description as loose, rather than diarrhea, and mostly being 4 or less bowel movements per day.  Will send in metronidazole  and Bactrim (patient had a rash with Augmentin in the past).  If not improved after antibiotics consider stool sample testing.  Recommended patient start full liquid diet and advance as tolerated while taking the antibiotics.

## 2024-01-26 NOTE — Progress Notes (Signed)
   Acute Office Visit  Subjective:     Patient ID: Kelli [name], female    DOB: [birth year], 83 y.o.   MRN: [id number]  Chief Complaint  Patient presents with   Follow-up    Loose stools 1-2 times a day, past 2 days uncomfortable feeling in abd with increased loose stool has a hx of diverticulitis     HPI Patient location: Work Provider location: [facility] primary care Method of visit: Video Duration of visit: 22 minutes.  Patient is in today for loose stools, increase stool frequency.  Symptoms originally started before our last visit about 2 weeks ago.  In total symptoms have been ongoing for over 3 weeks and progressively worsening.  Goes to the bathroom about 4 times a day now.  Would not describe it as diarrhea but more loose.  Has not had any incontinence issues.  No blood in stool.  Also does have a crampy abdominal pain, more so in the center of her abdomen than in the lower quadrants.  Has a history of diverticulitis requiring hospitalization 10 years ago.  ROS      Objective:    There were no vitals taken for this visit.   Physical Exam General: Alert, oriented Pulmonary: Able to speak full sentences.  No respiratory distress Psych: Pleasant affect.  No results found for any visits on 01/26/24.      Assessment & Plan:   Diarrhea, unspecified type Assessment & Plan: Possibly mild diverticulitis.  Also consider infectious, C. difficile less likely given the description as loose, rather than diarrhea, and mostly being 4 or less bowel movements per day.  Will send in metronidazole  and Bactrim (patient had a rash with Augmentin in the past).  If not improved after antibiotics consider stool sample testing.  Recommended patient start full liquid diet and advance as tolerated while taking the antibiotics.   Other orders -     metroNIDAZOLE ; Take 1 tablet (500 mg total) by mouth 3 (three) times daily for 7 days.  Dispense: 21 tablet; Refill: 0 -      Sulfamethoxazole-Trimethoprim; Take 1 tablet by mouth 2 (two) times daily for 7 days.  Dispense: 14 tablet; Refill: 0     No follow-ups on file.  Laneta Pintos, MD

## 2024-01-31 ENCOUNTER — Encounter (HOSPITAL_COMMUNITY): Payer: Self-pay

## 2024-01-31 ENCOUNTER — Other Ambulatory Visit: Payer: Self-pay

## 2024-01-31 ENCOUNTER — Ambulatory Visit (HOSPITAL_COMMUNITY)
Admission: EM | Admit: 2024-01-31 | Discharge: 2024-01-31 | Disposition: A | Discharge status: Home / Self Care | Attending: Family Medicine | Admitting: Family Medicine

## 2024-01-31 DIAGNOSIS — R6884 Jaw pain: Secondary | ICD-10-CM

## 2024-01-31 DIAGNOSIS — R6889 Other general symptoms and signs: Secondary | ICD-10-CM | POA: Diagnosis not present

## 2024-01-31 MED ORDER — ONDANSETRON 4 MG PO TBDP: 4.0000 mg | ORAL_TABLET | Freq: Three times a day (TID) | ORAL | 0 refills | Status: DC | PRN

## 2024-01-31 NOTE — ED Provider Notes (Signed)
 [facility]    CSN: [id number] Arrival date & time: 01/31/24  1532     History   Chief Complaint Chief Complaint  Patient presents with   Jaw Pain    HPI Kelli [name] is a 83 y.o. female.  Here with bilateral upper jaw "discomfort" that started yesterday. Seemed to worsen today. Currently rating 5/10 discomfort.  She denies overt pain, describes as more of an "awareness of her upper jaw" Denies fever She has felt jittery and "not right" No headache, vision changes, chest pain, shortness of breath, abdominal pain. Mild nausea, no vomiting.  She doesn't know if this is related to antibiotics she was given 5 days ago for diarrhea. Did a tele visit for 3 weeks diarrhea, given bactrim  and flagyl .  Reports history of a-fib, MI, CKD  Past Medical History:  Diagnosis Date   Allergy    Phreesia 07/04/2020   Arthritis    Phreesia 07/04/2020   Atrial fibrillation (HCC) 02/2013   paroxsymal; c/b Type 2 NSTEMI; CHADS2-VASc=2 (age, sex - patient had come off anti-HTN meds in past); Eliquis  started;  Echocardiogram 03/10/13: EF 55-60%, grade 1 diastolic dysfunction, mild MR, mild LAE   CKD (chronic kidney disease)    3a   GERD (gastroesophageal reflux disease)    Phreesia 07/04/2020   Hyperlipidemia    Hypertension    Myocardial infarction (HCC)    Phreesia 07/04/2020   NSTEMI (non-ST elevated myocardial infarction) (HCC) 02/2013   demand ischemia in setting of AFib with RVR; ETT-Myoview 03/11/13: No ischemia or scar, EF 70%.   PAF (paroxysmal atrial fibrillation) [facility])     Patient Active Problem List   Diagnosis Date Noted   Frequent loose stools 01/26/2024   Diastolic CHF (HCC) 01/11/2024   Lung nodule 05/12/2023   CKD (chronic kidney disease) stage 3, GFR 30-59 ml/min (HCC) 04/08/2023   Headache 02/03/2023   High risk medication use 09/21/2019   PAD (peripheral artery disease) (HCC) 08/16/2018   Prediabetes 04/07/2018   Bursitis of right knee 04/07/2018    Osteoarthritis of knee 04/07/2018   Osteopenia determined by x-ray 11/19/2017   Mixed hyperlipidemia 11/02/2017   Gastroesophageal reflux disease 11/02/2017   Allergic rhinitis due to pollen 11/02/2017   Vitamin D  deficiency 11/02/2017   Essential hypertension 03/10/2014   Chronic constipation 12/24/2013   Dyspnea on exertion 08/18/2013   NSTEMI (non-ST elevated myocardial infarction) (HCC) 03/10/2013   Atrial fibrillation (HCC) 03/09/2013    Past Surgical History:  Procedure Laterality Date   APPENDECTOMY     CATARACT EXTRACTION     CATARACT EXTRACTION, BILATERAL     EYE SURGERY     STRABISMUS SURGERY     TONSILLECTOMY     YAG LASER APPLICATION      OB History   No obstetric history on file.      Home Medications    Prior to Admission medications   Medication Sig Start Date End Date Taking? Authorizing Provider  ondansetron  (ZOFRAN -ODT) 4 MG disintegrating tablet Take 1 tablet (4 mg total) by mouth every 8 (eight) hours as needed for nausea or vomiting. 01/31/24  Yes Banister, Pamela K, MD  acetaminophen  (TYLENOL ) 500 MG tablet Take 500 mg by mouth daily as needed for pain.    [provider]  apixaban  (ELIQUIS ) 5 MG TABS tablet Take 1 tablet (5 mg total) by mouth 2 (two) times daily. 10/26/23   [place], Peter M, MD  apixaban  (ELIQUIS ) 5 MG TABS tablet TAKE 1 TABLET BY MOUTH  2 TIMES DAILY. 12/21/23   [place], Peter M, MD  atorvastatin  (LIPITOR) 10 MG tablet Take 1 tablet (10 mg total) by mouth daily. 01/11/24 04/10/24  Laneta Pintos, MD  Biotin 1 MG CAPS Take 1 tablet by mouth at bedtime.    [provider]  Calcium  Carb-Cholecalciferol  (CALCIUM  + D3) 600-200 MG-UNIT TABS Take 1 tablet by mouth daily.    [provider]  Cholecalciferol  (VITAMIN D ) 400 UNITS capsule Take 400 Units by mouth daily.    [provider]  Docusate Calcium  (STOOL SOFTENER PO) Take by mouth at bedtime.    [provider]  ELDERBERRY PO Take by mouth.     [provider]  empagliflozin  (JARDIANCE ) 10 MG TABS tablet Take 1 tablet (10 mg total) by mouth daily before breakfast. 01/11/24   Laneta Pintos, MD  famotidine  (PEPCID ) 20 MG tablet Take 1 tablet (20 mg total) by mouth daily. 05/12/23   Laneta Pintos, MD  fish oil-omega-3 fatty acids  1000 MG capsule Take 1 g by mouth daily.    [provider]  Flaxseed, Linseed, (FLAX SEED OIL PO) Take by mouth daily.    [provider]  fluticasone  (FLONASE ) 50 MCG/ACT nasal spray Place 2 sprays into both nostrils daily. 09/05/21   Boscia, Heather E, NP  hydrochlorothiazide  (HYDRODIURIL ) 12.5 MG tablet TAKE 1 TABLET (12.5 MG TOTAL) BY MOUTH DAILY. 08/21/23   [place], Peter M, MD  losartan  (COZAAR ) 50 MG tablet TAKE 1 TABLET (50 MG TOTAL) BY MOUTH DAILY. 09/30/23   [place], Peter M, MD  Magnesium 300 MG CAPS Take 1 capsule by mouth daily.    [provider]  methylcellulose (CITRUCEL) oral powder Use as directed daily 07/02/23   Armbruster, Lendon Queen, MD  metoprolol  tartrate (LOPRESSOR ) 50 MG tablet TAKE 1 TABLET BY MOUTH IN THE MORNING AND TAKE 1/2 TABLET IN THE AFTERNOON 07/27/23   [place], Peter M, MD  metroNIDAZOLE  (FLAGYL ) 500 MG tablet Take 1 tablet (500 mg total) by mouth 3 (three) times daily for 7 days. 01/26/24 02/02/24  Laneta Pintos, MD  polyethylene glycol (MIRALAX ) 17 g packet Take 17 g by mouth daily as needed. 07/02/23   Armbruster, Lendon Queen, MD  Probiotic Product (PROBIOTIC DAILY PO) Take 1 tablet by mouth daily as needed.     [provider]  sulfamethoxazole -trimethoprim  (BACTRIM  DS) 800-160 MG tablet Take 1 tablet by mouth 2 (two) times daily for 7 days. 01/26/24 02/02/24  Laneta Pintos, MD  vitamin C  (ASCORBIC ACID ) 500 MG tablet Take 500 mg by mouth daily.    [provider]    Family History Family History  Problem Relation Age of Onset   Heart attack Mother 80   Cancer Maternal Aunt        throat   Colon cancer Neg Hx    Esophageal  cancer Neg Hx    Stomach cancer Neg Hx     Social History Social History   Tobacco Use   Smoking status: Never   Smokeless tobacco: Never  Vaping Use   Vaping status: Never Used  Substance Use Topics   Alcohol use: Not Currently   Drug use: No     Allergies   Amoxicillin and Augmentin [amoxicillin-pot clavulanate]   Review of Systems Review of Systems Per HPI  Physical Exam Triage Vital Signs ED Triage Vitals [01/31/24 1617]  Encounter Vitals Group     BP 131/60     Systolic BP Percentile  Diastolic BP Percentile      Pulse Rate 67     Resp 14     Temp 97.9 F (36.6 C)     Temp Source Oral     SpO2 96 %     Weight      Height      Head Circumference      Peak Flow      Pain Score 5     Pain Loc      Pain Education      Exclude from Growth Chart    No data found.  Updated Vital Signs BP 131/60 (BP Location: Right Arm)   Pulse 67   Temp 97.9 F (36.6 C) (Oral)   Resp 14   SpO2 96%    Physical Exam Vitals and nursing note reviewed.  Constitutional:      General: She is not in acute distress.    Appearance: Normal appearance. She is not ill-appearing or diaphoretic.  HENT:     Head: Atraumatic. No contusion or masses.     Jaw: There is normal jaw occlusion. No tenderness, swelling or pain on movement.     Comments: No pain with palpation of the jaw.  Occlusion normal.  No facial swelling or tenderness.  No temporal tenderness     Mouth/Throat:     Pharynx: Oropharynx is clear.  Cardiovascular:     Rate and Rhythm: Normal rate and regular rhythm.     Pulses: Normal pulses.     Heart sounds: Normal heart sounds.     Comments: Regular rate and rhythm, equal Pulmonary:     Effort: Pulmonary effort is normal.     Breath sounds: Normal breath sounds.  Abdominal:     Palpations: Abdomen is soft.  Musculoskeletal:        General: Normal range of motion.     Cervical back: Normal range of motion. No rigidity or tenderness.  Lymphadenopathy:      Cervical: No cervical adenopathy.  Skin:    General: Skin is warm and dry.  Neurological:     Mental Status: She is alert and oriented to person, place, and time.     Motor: No weakness.     Comments: Sensation and strength intact     UC Treatments / Results  Labs (all labs ordered are listed, but only abnormal results are displayed) Labs Reviewed - No data to display  EKG   Radiology No results found.  Procedures Procedures (including critical care time)  Medications Ordered in UC Medications - No data to display  Initial Impression / Assessment and Plan / UC Course  I have reviewed the triage vital signs and the nursing notes.  Pertinent labs & imaging results that were available during my care of the patient were reviewed by me and considered in my medical decision making (see chart for details).  Vitals are stable.  She is overall very well-appearing.  Initially described her symptoms as jaw pain, however she denies pain and describes "has more of an awareness" of the jaw.  No itching or numbness/tingling.  Her exam is benign.  EKG normal sinus, ventricular rate of 65 bpm.  Compared with prior reading from 03/2023 no concerning changes.  Discussion with patient her symptoms are vague in nature and she does not currently have any red flags.  We have discussed strict emergency department precautions for any new or worsening of symptoms.  Otherwise she is able to follow with her primary care provider  early this week. Patient agrees to plan, no questions at this time. Daughter is present and verbalizes agreement as well  Final Clinical Impressions(s) / UC Diagnoses   Final diagnoses:  Jaw symptom     Discharge Instructions      I recommend continue tylenol  for pain  Monitor your symptoms today and the next few days  Please go to the emergency department if symptoms worsen or become severe.  Especially if you develop pain of the jaw or temples, severe headache,  chest pain or shortness of breath, worsening abdominal pain.   ED Prescriptions     Medication Sig Dispense Auth. Provider   ondansetron  (ZOFRAN -ODT) 4 MG disintegrating tablet Take 1 tablet (4 mg total) by mouth every 8 (eight) hours as needed for nausea or vomiting. 10 tablet Ellsworth Haas Pamela K, MD      PDMP not reviewed this encounter.   Creighton Doffing, [place] 01/31/24 1743

## 2024-01-31 NOTE — Discharge Instructions (Signed)
 I recommend continue tylenol  for pain  Monitor your symptoms today and the next few days  Please go to the emergency department if symptoms worsen or become severe.  Especially if you develop pain of the jaw or temples, severe headache, chest pain or shortness of breath, worsening abdominal pain.

## 2024-01-31 NOTE — ED Triage Notes (Signed)
 Patient reports that she has had diarrhea x 3 weeks and was given antibiotics on 01/26/24, Patient states that she has bilateral upper jaw pain that started yesterday, but worse today. Patient states she "does not feel right." Patient states she has been jittery, not feeling settled, nausea, and a metallic taste in her mouth.

## 2024-02-01 ENCOUNTER — Ambulatory Visit: Payer: Self-pay | Admitting: *Deleted

## 2024-02-01 NOTE — Telephone Encounter (Signed)
  Chief Complaint: Bad metal taste in her mouth with mild nausea and diarrhea from the Metronidazole  and Bactrum Dr. Arabella Beach prescribed for her on 5/13 for diarrhea.   Symptoms: "The diarrhea is much better but still having a little".   "Mild nausea and a bad metal taste in my mouth".        Was asking if she could stop the medication.    She only has today and tomorrow left and she will be done.   I encouraged her to complete the therapy if she could and the reasoning for completing all the antibiotics.   She was agreeable to completing therapy. She went to urgent care yesterday 5/18 due to jaw pain.  They did an EKG and everything was fine.   See triage notes for details.   Frequency: Since 5/13 when she saw Dr. Arabella Beach and was started on the antibiotics.   Pertinent Negatives: Patient denies the diarrhea being so bad now.    After a couple of days after I take the last pill if I'm not better or still have this taste in my mouth I will call back.   I instructed to please call us  back. Disposition: [] ED /[] Urgent Care (no appt availability in office) / [] Appointment(In office/virtual)/ []  [facility]/ [x] Home Care/ [] Refused Recommended Disposition /[] [facility]/ [x]  Follow-up with PCP Additional Notes: Message sent to Dr. Arabella Beach for his information.

## 2024-02-01 NOTE — Telephone Encounter (Signed)
 Message from [name] sent at 02/01/2024 10:48 AM EDT  Summary: nausea//diarrhea   Copied From CRM [id number]. Reason for Triage: The patient went to Urgent Care yesterday because she did not feel well. However, she is still symptomatic and now thinks it may be due the antibiotics. She has metal mouth, nausea, and diarrhea. She states she feels spacey.          Call History  Contact Date/Time Type Contact Phone/Fax By  02/01/2024 10:37 AM EDT Phone (Incoming) [name], Kelli [name] [phone] Alyce Baba   Reason for Disposition  [1] Caller has URGENT medicine question about med that PCP or specialist prescribed AND [2] triager unable to answer question    Has a bad metal taste in her mouth since starting the antibiotics prescribed by Dr Arabella Beach on 5/13 for diarrhea.  Some nausea and diarrhea.   "The diarrhea is much better now".  Answer Assessment - Initial Assessment Questions 1. NAME of MEDICINE: "What medicine(s) are you calling about?"   Metronidazole  and Bactrum that Dr. Arabella Beach prescribed for me on 01/26/2024 for diarrhea.    I have a bad metal taste in my mouth with nausea and some diarrhea.    Yesterday I did not feel good.   I felt disoriented for a min. And jittery.   I went to [facility] urgent care because my upper jaw was hurting.  They did an EKG and it was fine.  They didn't find anything wrong.   They told me to follow up with my PCP.   I think it's the medicine Dr. Arabella Beach prescribed for me because the taste started after I started on those medicines.    I only have 2 more days of the medicine today and tomorrow.    Should I continue to take it?   I let her know the importance of completing the antibiotics and the reasoning.   She was agreeable to completing the therapy and will call back if the metal taste in her mouth does not go away after a couple of days being off of it. I will continue the medicine but the metal taste is awful.   2. QUESTION: "What is your question?" (e.g., double  dose of medicine, side effect)     I do have nausea.   The diarrhea is much better but I still have a little.  I'm usually constipated.  So this is different for me to have diarrhea.  It's much better now.    The medicine makes me feel very tired.   I let her know I would pass her symptoms along to Dr. Arabella Beach in case he has any further recommendations for her.   She was agreeable to this plan and agreeable to completing the therapy.    3. PRESCRIBER: "Who prescribed the medicine?" Reason: if prescribed by specialist, call should be referred to that group.     Dr. Arabella Beach for diarrhea on 01/26/2024. 4. SYMPTOMS: "Do you have any symptoms?" If Yes, ask: "What symptoms are you having?"  "How bad are the symptoms (e.g., mild, moderate, severe)     See above 5. PREGNANCY:  "Is there any chance that you are pregnant?" "When was your last menstrual period?"     N/A due to age  Protocols used: Medication Question Call-A-AH

## 2024-02-02 ENCOUNTER — Ambulatory Visit (INDEPENDENT_AMBULATORY_CARE_PROVIDER_SITE_OTHER): Admitting: Family Medicine

## 2024-02-02 VITALS — BP 121/68 | HR 69 | Ht 63.0 in | Wt 161.2 lb

## 2024-02-02 DIAGNOSIS — R1013 Epigastric pain: Secondary | ICD-10-CM | POA: Diagnosis not present

## 2024-02-02 DIAGNOSIS — R450 Nervousness: Secondary | ICD-10-CM | POA: Diagnosis not present

## 2024-02-02 DIAGNOSIS — R438 Other disturbances of smell and taste: Secondary | ICD-10-CM

## 2024-02-02 DIAGNOSIS — R4189 Other symptoms and signs involving cognitive functions and awareness: Secondary | ICD-10-CM | POA: Diagnosis not present

## 2024-02-02 LAB — GLUCOSE, POCT (MANUAL RESULT ENTRY): POC Glucose: 93 mg/dL (ref 70–99)

## 2024-02-02 NOTE — Progress Notes (Signed)
   Acute Office Visit  Subjective:     Patient ID: Kelli [name], female    DOB: [birth year], 83 y.o.   MRN: [id number]  Chief Complaint  Patient presents with   Headache    Jaw pain, jittery, trouble thinking    HPI Patient is in today for   Subjective - Metallic, pasty taste in mouth constantly - Head feels swollen, pressure in back of neck - Foggy headed, not thinking as fast as usual - Abdominal pain, belching, increased gas - Nausea - Loose stools, previously watery diarrhea - Feeling jittery, trembling inside - Pressure in jaw - Blurrier vision in right eye (has existing vision issues)  Medications: Eliquis , statin for cholesterol, Jardiance , hydrochlorothiazide , Losartan , Metoprolol , antibiotics (finishing last dose), Zofran  for nausea, calcium  for osteoporosis, previously took Prilosec for 2 weeks (not recently), took Gas-X yesterday  PMH, PSH, FH, Social Hx: 83 years old, 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  Stopping abx.  Getting esr, crp, tsh, cmp, mag,   Orders: -     POCT glucose (manual entry) -     TSH -     Comprehensive metabolic panel with GFR -     Sedimentation rate -     CBC with Differential/Platelet -     Magnesium  Dyspepsia -     H. pylori breath test  Brain fog -     TSH -     Sedimentation rate -     C-reactive protein  Metallic  taste Assessment & Plan: Likely 2/2 antibiotics.  Stopping abx today.    Orders: -     Comprehensive metabolic panel with GFR -     CBC with Differential/Platelet -     Magnesium     Return if symptoms worsen or fail to improve.  Laneta Pintos, MD

## 2024-02-02 NOTE — Patient Instructions (Signed)
 It was nice to see you today,  We addressed the following topics today: -We will get lab test tomorrow.  You can take the Zofran  as needed.  Try to avoid taking anything like Prilosec, Protonix , or Pepcid  tonight. - Do not take your antibiotics anymore. - If you have any concerning symptoms for stroke or mental confusion you should has 1 to you to the emergency department tonight.  Things to look out for include muscle weakness or numbness, speech difficulty, slurring, facial droop, drifting or imbalance issues, confusion  Have a great day,  Kelli Henle, MD

## 2024-02-03 ENCOUNTER — Other Ambulatory Visit

## 2024-02-03 DIAGNOSIS — R1013 Epigastric pain: Secondary | ICD-10-CM | POA: Diagnosis not present

## 2024-02-03 DIAGNOSIS — M47812 Spondylosis without myelopathy or radiculopathy, cervical region: Secondary | ICD-10-CM | POA: Diagnosis not present

## 2024-02-03 DIAGNOSIS — M9901 Segmental and somatic dysfunction of cervical region: Secondary | ICD-10-CM | POA: Diagnosis not present

## 2024-02-03 DIAGNOSIS — M9903 Segmental and somatic dysfunction of lumbar region: Secondary | ICD-10-CM | POA: Diagnosis not present

## 2024-02-03 DIAGNOSIS — R450 Nervousness: Secondary | ICD-10-CM | POA: Insufficient documentation

## 2024-02-03 DIAGNOSIS — M47897 Other spondylosis, lumbosacral region: Secondary | ICD-10-CM | POA: Diagnosis not present

## 2024-02-03 DIAGNOSIS — R438 Other disturbances of smell and taste: Secondary | ICD-10-CM | POA: Insufficient documentation

## 2024-02-03 DIAGNOSIS — M47815 Spondylosis without myelopathy or radiculopathy, thoracolumbar region: Secondary | ICD-10-CM | POA: Diagnosis not present

## 2024-02-03 DIAGNOSIS — M9902 Segmental and somatic dysfunction of thoracic region: Secondary | ICD-10-CM | POA: Diagnosis not present

## 2024-02-03 NOTE — Assessment & Plan Note (Signed)
 Likely a reaction to the abx: bactrim  and flagyl .  Stopping abx.  Getting esr, crp, tsh, cmp, mag,

## 2024-02-03 NOTE — Assessment & Plan Note (Signed)
 Likely 2/2 antibiotics.  Stopping abx today.

## 2024-02-05 ENCOUNTER — Ambulatory Visit: Payer: Self-pay | Admitting: Family Medicine

## 2024-02-05 LAB — CBC WITH DIFFERENTIAL/PLATELET
Basophils Absolute: 0.1 10*3/uL (ref 0.0–0.2)
Basos: 1 %
EOS (ABSOLUTE): 0.3 10*3/uL (ref 0.0–0.4)
Eos: 4 %
Hematocrit: 39.3 % (ref 34.0–46.6)
Hemoglobin: 13.1 g/dL (ref 11.1–15.9)
Immature Grans (Abs): 0 10*3/uL (ref 0.0–0.1)
Immature Granulocytes: 1 %
Lymphocytes Absolute: 1.5 10*3/uL (ref 0.7–3.1)
Lymphs: 23 %
MCH: 30.3 pg (ref 26.6–33.0)
MCHC: 33.3 g/dL (ref 31.5–35.7)
MCV: 91 fL (ref 79–97)
Monocytes Absolute: 1 10*3/uL — ABNORMAL HIGH (ref 0.1–0.9)
Monocytes: 15 %
Neutrophils Absolute: 3.7 10*3/uL (ref 1.4–7.0)
Neutrophils: 56 %
Platelets: 260 10*3/uL (ref 150–450)
RBC: 4.32 x10E6/uL (ref 3.77–5.28)
RDW: 12.5 % (ref 11.7–15.4)
WBC: 6.5 10*3/uL (ref 3.4–10.8)

## 2024-02-05 LAB — COMPREHENSIVE METABOLIC PANEL WITH GFR
ALT: 39 IU/L — ABNORMAL HIGH (ref 0–32)
AST: 48 IU/L — ABNORMAL HIGH (ref 0–40)
Albumin: 3.9 g/dL (ref 3.7–4.7)
Alkaline Phosphatase: 66 IU/L (ref 44–121)
BUN/Creatinine Ratio: 19 (ref 12–28)
BUN: 32 mg/dL — ABNORMAL HIGH (ref 8–27)
Bilirubin Total: 0.3 mg/dL (ref 0.0–1.2)
CO2: 21 mmol/L (ref 20–29)
Calcium: 8.9 mg/dL (ref 8.7–10.3)
Chloride: 97 mmol/L (ref 96–106)
Creatinine, Ser: 1.7 mg/dL — ABNORMAL HIGH (ref 0.57–1.00)
Globulin, Total: 3 g/dL (ref 1.5–4.5)
Glucose: 87 mg/dL (ref 70–99)
Potassium: 4.8 mmol/L (ref 3.5–5.2)
Sodium: 132 mmol/L — ABNORMAL LOW (ref 134–144)
Total Protein: 6.9 g/dL (ref 6.0–8.5)
eGFR: 30 mL/min/{1.73_m2} — ABNORMAL LOW (ref >59)

## 2024-02-05 LAB — H. PYLORI BREATH TEST: H pylori Breath Test: NEGATIVE

## 2024-02-05 LAB — MAGNESIUM: Magnesium: 2.2 mg/dL (ref 1.6–2.3)

## 2024-02-05 LAB — SEDIMENTATION RATE: Sed Rate: 16 mm/h (ref 0–40)

## 2024-02-05 LAB — TSH: TSH: 1.37 u[IU]/mL (ref 0.450–4.500)

## 2024-02-05 LAB — C-REACTIVE PROTEIN: CRP: 8 mg/L (ref 0–10)

## 2024-02-11 ENCOUNTER — Other Ambulatory Visit: Payer: Self-pay | Admitting: *Deleted

## 2024-02-11 DIAGNOSIS — R7989 Other specified abnormal findings of blood chemistry: Secondary | ICD-10-CM

## 2024-02-11 DIAGNOSIS — I10 Essential (primary) hypertension: Secondary | ICD-10-CM

## 2024-02-11 DIAGNOSIS — D729 Disorder of white blood cells, unspecified: Secondary | ICD-10-CM

## 2024-02-12 ENCOUNTER — Other Ambulatory Visit

## 2024-02-12 DIAGNOSIS — R7989 Other specified abnormal findings of blood chemistry: Secondary | ICD-10-CM

## 2024-02-12 DIAGNOSIS — D729 Disorder of white blood cells, unspecified: Secondary | ICD-10-CM

## 2024-02-13 LAB — CBC WITH DIFFERENTIAL/PLATELET
Basophils Absolute: 0.1 10*3/uL (ref 0.0–0.2)
Basos: 1 %
EOS (ABSOLUTE): 0.3 10*3/uL (ref 0.0–0.4)
Eos: 4 %
Hematocrit: 40.5 % (ref 34.0–46.6)
Hemoglobin: 13.1 g/dL (ref 11.1–15.9)
Immature Grans (Abs): 0 10*3/uL (ref 0.0–0.1)
Immature Granulocytes: 1 %
Lymphocytes Absolute: 1.9 10*3/uL (ref 0.7–3.1)
Lymphs: 23 %
MCH: 31.1 pg (ref 26.6–33.0)
MCHC: 32.3 g/dL (ref 31.5–35.7)
MCV: 96 fL (ref 79–97)
Monocytes Absolute: 0.9 10*3/uL (ref 0.1–0.9)
Monocytes: 11 %
Neutrophils Absolute: 5.1 10*3/uL (ref 1.4–7.0)
Neutrophils: 60 %
Platelets: 297 10*3/uL (ref 150–450)
RBC: 4.21 x10E6/uL (ref 3.77–5.28)
RDW: 12.6 % (ref 11.7–15.4)
WBC: 8.3 10*3/uL (ref 3.4–10.8)

## 2024-02-13 LAB — COMPREHENSIVE METABOLIC PANEL WITH GFR
ALT: 24 IU/L (ref 0–32)
AST: 32 IU/L (ref 0–40)
Albumin: 4.1 g/dL (ref 3.7–4.7)
Alkaline Phosphatase: 75 IU/L (ref 44–121)
BUN/Creatinine Ratio: 21 (ref 12–28)
BUN: 27 mg/dL (ref 8–27)
Bilirubin Total: 0.4 mg/dL (ref 0.0–1.2)
CO2: 24 mmol/L (ref 20–29)
Calcium: 9.2 mg/dL (ref 8.7–10.3)
Chloride: 99 mmol/L (ref 96–106)
Creatinine, Ser: 1.26 mg/dL — ABNORMAL HIGH (ref 0.57–1.00)
Globulin, Total: 2.8 g/dL (ref 1.5–4.5)
Glucose: 121 mg/dL — ABNORMAL HIGH (ref 70–99)
Potassium: 4.3 mmol/L (ref 3.5–5.2)
Sodium: 138 mmol/L (ref 134–144)
Total Protein: 6.9 g/dL (ref 6.0–8.5)
eGFR: 42 mL/min/{1.73_m2} — ABNORMAL LOW (ref >59)

## 2024-02-14 ENCOUNTER — Ambulatory Visit: Payer: Self-pay | Admitting: Family Medicine

## 2024-02-16 ENCOUNTER — Telehealth: Payer: Self-pay

## 2024-02-16 NOTE — Progress Notes (Signed)
   02/16/2024  Patient ID: Luverta C Erichsen, female   DOB: Nov 04, 1940, 83 y.o.   MRN: [id number]  Pharmacy Quality Measure Review  This patient is appearing on a report for being at risk of failing the adherence measure for diabetes medications this calendar year.   Medication: Jardiance  10 filled 02/01/2024 30 DS Insurance report was not up to date. No action needed at this time.   Rolando Cliche, PharmD, BCGP Clinical Pharmacist  [phone]

## 2024-03-03 DIAGNOSIS — M9904 Segmental and somatic dysfunction of sacral region: Secondary | ICD-10-CM | POA: Diagnosis not present

## 2024-03-03 DIAGNOSIS — M4727 Other spondylosis with radiculopathy, lumbosacral region: Secondary | ICD-10-CM | POA: Diagnosis not present

## 2024-03-03 DIAGNOSIS — M4723 Other spondylosis with radiculopathy, cervicothoracic region: Secondary | ICD-10-CM | POA: Diagnosis not present

## 2024-03-03 DIAGNOSIS — M9901 Segmental and somatic dysfunction of cervical region: Secondary | ICD-10-CM | POA: Diagnosis not present

## 2024-03-03 DIAGNOSIS — M25551 Pain in right hip: Secondary | ICD-10-CM | POA: Diagnosis not present

## 2024-03-03 DIAGNOSIS — M9903 Segmental and somatic dysfunction of lumbar region: Secondary | ICD-10-CM | POA: Diagnosis not present

## 2024-03-09 ENCOUNTER — Telehealth: Payer: Self-pay | Admitting: Cardiology

## 2024-03-09 NOTE — Telephone Encounter (Signed)
 Spoke to patient advised she is due for a follow up visit with Dr.Jordan in August.Appointment scheduled 7/25 at 3:20 pm.

## 2024-03-09 NOTE — Telephone Encounter (Signed)
 Patient reports early this morning around 1:23 AM she woke up to use the bathroom and noticed her heart was beating faster. She states her fitness watch was reading 123 bpm, this episode lasted for about 2 hours with average HR 115 throughout the episode. Patient states she took an extra half tablet of Lopressor  (25 mg) and a Tylenol . This eventually helped bring HR back down to patient's baseline.  Patient denies any SOB, CP, dizziness. She states she did feel palpitation/fluttering during episode.  Patient states she has only had 2-3 episodes in the past 2 months, but these were during the day and only last about 30 minutes. She was concerned with this one occurring in the night and lasting for 2 hours.  Patient reports compliance with taking Eliquis  and Lopressor  as prescribed. Patient aware she may take additional half tablet of Lopressor  for HR >120, SBP >100 if asymptomatic. Also advised on ED precautions of HR >120 and/or symptomatic with SOB/chest pain/dizziness.  Patient verbalized understanding. Will forward to Dr. [place] and his nurse to review.

## 2024-03-09 NOTE — Telephone Encounter (Signed)
 Patient c/o Palpitations:  STAT if patient reporting lightheadedness, shortness of breath, or chest pain  How long have you had palpitations/irregular HR/ Afib? Tachycardia. Are you having the symptoms now? No  Are you currently experiencing lightheadedness, SOB or CP? No  Do you have a history of afib (atrial fibrillation) or irregular heart rhythm? Afib  Have you checked your BP or HR? (document readings if available): 123 hr during episode last night  Are you experiencing any other symptoms? No

## 2024-04-06 NOTE — Progress Notes (Unsigned)
 Kelli [name] Date of Birth: 1941-08-12 Medical Record #[id number]  History of Present Illness: Kelli [name] is seen for follow up of atrial fibrillation.  She has a history of HTN, paroxysmal Afib with RVR. Admitted in 2014. Had demand ischemia. Myoview study was normal.  Echo from [name] of 2014 with EF of 55 to 60% and grade 1 diastolic dysfunction. She has been on chronic therapy with metoprolol  and Eliquis .   She was seen this past summer by Jon Hails PA-C with symptoms of DOE. Echo showed gr 2 diastolic dysfunction with normal EF. Mod MR, mild-mod TR. BNP 151. Coronary CTA showed a high calcium  score of 631 but mild nonobstructive CAD. There was ? LA appendage thrombus. Patient already on Eliquis . Pulmonary nodule noted with recommendation for follow up CT at 6-12 months. Mild thickening of esophagus.   She did have repeat CT showing no change in pulmonary nodule- benign.  She did have one episode of Afib at night. HR 122. Last about an hour and converted. Does note some SOB walking up hill. States it it better if she holds her thigh. Would like to have more energy.   Current Outpatient Medications  Medication Sig Dispense Refill   acetaminophen  (TYLENOL ) 500 MG tablet Take 500 mg by mouth daily as needed for pain.     apixaban  (ELIQUIS ) 5 MG TABS tablet TAKE 1 TABLET BY MOUTH 2 TIMES DAILY. 60 tablet 5   atorvastatin  (LIPITOR) 10 MG tablet Take 1 tablet (10 mg total) by mouth daily. 90 tablet 3   Biotin 1 MG CAPS Take 1 tablet by mouth at bedtime.     Calcium  Carb-Cholecalciferol  (CALCIUM  + D3) 600-200 MG-UNIT TABS Take 1 tablet by mouth daily.     Cholecalciferol  (VITAMIN D ) 400 UNITS capsule Take 400 Units by mouth daily.     ELDERBERRY PO Take by mouth.     empagliflozin  (JARDIANCE ) 10 MG TABS tablet Take 1 tablet (10 mg total) by mouth daily before breakfast. 90 tablet 3   fish oil-omega-3 fatty acids  1000 MG capsule Take 1 g by mouth daily.     Flaxseed, Linseed, (FLAX SEED OIL  PO) Take by mouth daily.     fluticasone  (FLONASE ) 50 MCG/ACT nasal spray Place 2 sprays into both nostrils daily. 16 g 6   hydrochlorothiazide  (HYDRODIURIL ) 12.5 MG tablet TAKE 1 TABLET (12.5 MG TOTAL) BY MOUTH DAILY. 90 tablet 1   losartan  (COZAAR ) 50 MG tablet TAKE 1 TABLET (50 MG TOTAL) BY MOUTH DAILY. 90 tablet 1   Magnesium 300 MG CAPS Take 1 capsule by mouth daily.     metoprolol  tartrate (LOPRESSOR ) 50 MG tablet TAKE 1 TABLET BY MOUTH IN THE MORNING AND TAKE 1/2 TABLET IN THE AFTERNOON 135 tablet 2   ondansetron  (ZOFRAN -ODT) 4 MG disintegrating tablet Take 1 tablet (4 mg total) by mouth every 8 (eight) hours as needed for nausea or vomiting. 10 tablet 0   Probiotic Product (PROBIOTIC DAILY PO) Take 1 tablet by mouth daily as needed.      vitamin C  (ASCORBIC ACID ) 500 MG tablet Take 500 mg by mouth daily.     famotidine  (PEPCID ) 20 MG tablet Take 1 tablet (20 mg total) by mouth daily. (Patient not taking: Reported on 04/08/2024) 30 tablet 1   methylcellulose (CITRUCEL) oral powder Use as directed daily     No current facility-administered medications for this visit.    Allergies  Allergen Reactions   Amoxicillin Rash   Augmentin [Amoxicillin-Pot Clavulanate] Itching  and Rash    Past Medical History:  Diagnosis Date   Allergy    Phreesia 07/04/2020   Arthritis    Phreesia 07/04/2020   Atrial fibrillation (HCC) 02/2013   paroxsymal; c/b Type 2 NSTEMI; CHADS2-VASc=2 (age, sex - patient had come off anti-HTN meds in past); Eliquis  started;  Echocardiogram 03/10/13: EF 55-60%, grade 1 diastolic dysfunction, mild MR, mild LAE   CKD (chronic kidney disease)    3a   GERD (gastroesophageal reflux disease)    Phreesia 07/04/2020   Hyperlipidemia    Hypertension    Myocardial infarction (HCC)    Phreesia 07/04/2020   NSTEMI (non-ST elevated myocardial infarction) (HCC) 02/2013   demand ischemia in setting of AFib with RVR; ETT-Myoview 03/11/13: No ischemia or scar, EF 70%.   PAF  (paroxysmal atrial fibrillation) (HCC)     Past Surgical History:  Procedure Laterality Date   APPENDECTOMY     CATARACT EXTRACTION     CATARACT EXTRACTION, BILATERAL     EYE SURGERY     STRABISMUS SURGERY     TONSILLECTOMY     YAG LASER APPLICATION      Social History   Tobacco Use  Smoking Status Never  Smokeless Tobacco Never    Social History   Substance and Sexual Activity  Alcohol Use Not Currently    Family History  Problem Relation Age of Onset   Heart attack Mother 60   Cancer Maternal Aunt        throat   Colon cancer Neg Hx    Esophageal cancer Neg Hx    Stomach cancer Neg Hx     Review of Systems: The review of systems is per the HPI.  All other systems were reviewed and are negative.  Physical Exam: BP (!) 120/58   Pulse 64   Ht 5' 3 (1.6 m)   Wt 157 lb 4 oz (71.3 kg)   SpO2 98%   BMI 27.86 kg/m  GENERAL:  Well appearing WF in NAD HEENT:  PERRL, EOMI, sclera are clear. Oropharynx is clear. NECK:  No jugular venous distention, carotid upstroke brisk and symmetric, no bruits, no thyromegaly or adenopathy LUNGS:  Clear to auscultation bilaterally CHEST:  Unremarkable HEART:  RRR,  PMI not displaced or sustained,S1 and S2 within normal limits, no S3, no S4: no clicks, no rubs, soft 1/6 systolic murmur LSB ABD:  Soft, nontender. BS +, no masses or bruits. No hepatomegaly, no splenomegaly EXT:  2 + pulses throughout, no edema, no cyanosis no clubbing SKIN:  Warm and dry.  No rashes NEURO:  Alert and oriented x 3. Cranial nerves II through XII intact. PSYCH:  Cognitively intact      LABORATORY DATA:  Lab Results  Component Value Date   WBC 8.3 02/12/2024   HGB 13.1 02/12/2024   HCT 40.5 02/12/2024   PLT 297 02/12/2024   GLUCOSE 121 (H) 02/12/2024   CHOL 151 01/11/2024   TRIG 90 01/11/2024   HDL 48 01/11/2024   LDLCALC 86 01/11/2024   ALT 24 02/12/2024   AST 32 02/12/2024   NA 138 02/12/2024   K 4.3 02/12/2024   CL 99 02/12/2024    CREATININE 1.26 (H) 02/12/2024   BUN 27 02/12/2024   CO2 24 02/12/2024   TSH 1.370 02/03/2024   INR 1.19 01/03/2014   HGBA1C 5.8 (H) 01/11/2024    Echo 03/18/23: IMPRESSIONS     1. Left ventricular ejection fraction, by estimation, is 60 to 65%. The  left  ventricle has normal function. The left ventricle has no regional  wall motion abnormalities. There is mild left ventricular hypertrophy.  Left ventricular diastolic parameters  are consistent with Grade II diastolic dysfunction (pseudonormalization).   2. Right ventricular systolic function is normal. The right ventricular  size is normal. There is normal pulmonary artery systolic pressure.   3. Left atrial size was moderately dilated.   4. The mitral valve is degenerative. Mild to moderate mitral valve  regurgitation.   5. Tricuspid valve regurgitation is mild to moderate.   6. The aortic valve is grossly normal. Aortic valve regurgitation is not  visualized.   7. The inferior vena cava is normal in size with greater than 50%  respiratory variability, suggesting right atrial pressure of 3 mmHg.   OVER-READ INTERPRETATION  PET-CT CHEST   The following report is an over-read performed by radiologist Dr. Camellia Lang [facility] on 04/25/2023. This over-read does not include interpretation of cardiac or coronary anatomy or pathology. The cardiac CT interpretation by the cardiologist is to be attached.   COMPARISON:  None.   FINDINGS: Mild circumferential wall thickening noted distal esophagus with tiny hiatal hernia evident.   6 mm posterior right middle lobe pulmonary nodule identified on image 18/11.   4 mm low-density lesion in the dome of the liver is too small to characterize but is statistically most likely benign. No followup imaging is recommended.   No suspicious lytic or sclerotic osseous abnormality.   IMPRESSION: 1. 6 mm posterior right middle lobe pulmonary nodule. Non-contrast chest CT  at 6-12 months is recommended. If the nodule is stable at time of repeat CT, then future CT at 18-24 months (from today's scan) is considered optional for low-risk patients, but is recommended for high-risk patients. This recommendation follows the consensus statement: Guidelines for Management of Incidental Pulmonary Nodules Detected on CT Images: From the Fleischner Society 2017; Radiology 2017; 284:228-243. 2. Mild circumferential wall thickening distal esophagus with tiny hiatal hernia. Correlation for symptoms of esophagitis recommended.   These results will be called to the ordering clinician or representative by the Radiologist Assistant, and communication documented in the PACS or [employer].     Electronically Signed   By: Camellia Candle M.D.   On: 04/25/2023 07:44    Addended by Candle Camellia, MD on 04/25/2023  7:46 AM    Study Result  Narrative & Impression  CLINICAL DATA:  83 yo female with DOE   EXAM: Cardiac/Coronary CTA   TECHNIQUE: A non-contrast, gated CT scan was obtained with axial slices of 3 mm through the heart for calcium  scoring. Calcium  scoring was performed using the Agatston method. A 120 kV prospective, gated, contrast cardiac scan was obtained. Gantry rotation speed was 250 msecs and collimation was 0.6 mm. Two sublingual nitroglycerin  tablets (0.8 mg) were given. The 3D data set was reconstructed in 5% intervals of the 35-75% of the R-R cycle. Diastolic phases were analyzed on a dedicated workstation using MPR, MIP, and VRT modes. The patient received 95 cc of contrast.   FINDINGS: Image quality: Excellent.   Noise artifact is: Limited.   Coronary Arteries:  Normal coronary origin.  Right dominance.   Left main: The left main is a large caliber vessel with a normal take off from the left coronary cusp that bifurcates to form a left anterior descending artery and a left circumflex artery. There is no plaque or stenosis.   Left  anterior descending artery: The LAD has minimal (0-24)  calcified plaque in the proximal and mid vessel. The LAD gives off 1 small diagonal with mild (25-49) ostial stenosis and mild (25-49) plaque in the mid vessel.   Left circumflex artery: The LCX is non-dominant and has minimal (0-24) plaque in the proximal vessel and mild (25-49) plaque in the mid vessel. The LCX gives off 2 large, patent obtuse marginal branches.   Right coronary artery: The RCA is dominant with normal take off from the right coronary cusp. There is mild (25-49) plaque in the proximal to mid vessel. The RCA terminates as a PDA and right posterolateral branch without evidence of plaque or stenosis.   Right Atrium: Right atrial size is within normal limits.   Right Ventricle: The right ventricular cavity is within normal limits.   Left Atrium: Left atrial size is enlarged; cannot R/O thrombus at left atrial appendage tip.   Left Ventricle: The ventricular cavity size is within normal limits.   Pulmonary arteries: Normal in size.   Pulmonary veins: Normal pulmonary venous drainage.   Pericardium: Normal thickness without significant effusion or calcium  present.   Cardiac valves: The aortic valve is trileaflet without significant calcification. The mitral valve is normal; significant mitral annular calcification.   Aorta: Normal caliber with aortic atherosclerosis.   Extra-cardiac findings: See attached radiology report for non-cardiac structures.   IMPRESSION: 1. Coronary calcium  score of 661. This was 66 percentile for age-, sex, and race-matched controls.   2. Total plaque volume 577 mm3 which is 59 percentile for age- and sex-matched controls (calcified plaque 128 mm3; non-calcified plaque 449 mm3). TPV is (severe).   3. Normal coronary origin with right dominance.   4. Mild coronary artery disease in the LAD, diagonal, Lcx and RCA as outlined.   5. Aortic atherosclerosis.   6. LAE; cannot  R/O left atrial appendage thrombus.   RECOMMENDATIONS: CAD-RADS 2: Mild non-obstructive CAD (25-49%). Consider non-atherosclerotic causes of chest pain. Consider preventive therapy and risk factor modification.   Redell Shallow, MD       Assessment / Plan: 1.  PAF - rare episodes. Continue metoprolol  and Eliquis .   2.  Chronic diastolic CHF. Related to age, AFib, HTN. Moderate MR on Echo but exam is pretty unremarkable. BP is optimal.  Euvolemic. Continue  Jardiance  10 mg daily. Low sodium diet. Continue regular exercise.   3. HTN - BP is well controlled.  4. HLD. Last LDL 86 on lipitor 10 mg every other day. Higher doses result in arthralgias. We discussed and decided at age 9 to stay the same  5. Pulmonary nodule. Follow up CT benign   6. CAD with high coronary calcium  score. Nonobstructive disease  by CTA. Risk factor modification   I will follow up in 6 months

## 2024-04-08 ENCOUNTER — Ambulatory Visit: Attending: Cardiology | Admitting: Cardiology

## 2024-04-08 VITALS — BP 120/58 | HR 64 | Ht 63.0 in | Wt 157.2 lb

## 2024-04-08 DIAGNOSIS — I5032 Chronic diastolic (congestive) heart failure: Secondary | ICD-10-CM | POA: Diagnosis not present

## 2024-04-08 DIAGNOSIS — I2510 Atherosclerotic heart disease of native coronary artery without angina pectoris: Secondary | ICD-10-CM

## 2024-04-08 DIAGNOSIS — I480 Paroxysmal atrial fibrillation: Secondary | ICD-10-CM | POA: Diagnosis not present

## 2024-04-08 DIAGNOSIS — I10 Essential (primary) hypertension: Secondary | ICD-10-CM | POA: Diagnosis not present

## 2024-04-08 NOTE — Patient Instructions (Signed)
 Medication Instructions:  Continue same medications *If you need a refill on your cardiac medications before your next appointment, please call your pharmacy*  Lab Work: None ordered  Testing/Procedures: None ordered  Follow-Up: At [facility], you and your health needs are our priority.  As part of our continuing mission to provide you with exceptional heart care, our providers are all part of one team.  This team includes your primary Cardiologist (physician) and Advanced Practice Providers or APPs (Physician Assistants and Nurse Practitioners) who all work together to provide you with the care you need, when you need it.  Your next appointment:  6 months   Call in Sept to schedule Jan appointment     Provider:  Jon Hails PA         1 year with Dr.Jordan  Call in March to schedule July appointment     We recommend signing up for the patient portal called MyChart.  Sign up information is provided on this After Visit Summary.  MyChart is used to connect with patients for Virtual Visits (Telemedicine).  Patients are able to view lab/test results, encounter notes, upcoming appointments, etc.  Non-urgent messages can be sent to your provider as well.   To learn more about what you can do with MyChart, go to [email or web address].

## 2024-04-11 ENCOUNTER — Other Ambulatory Visit: Payer: Self-pay | Admitting: Cardiology

## 2024-05-25 ENCOUNTER — Telehealth: Payer: Self-pay | Admitting: Cardiology

## 2024-05-25 DIAGNOSIS — E875 Hyperkalemia: Secondary | ICD-10-CM

## 2024-05-25 NOTE — Telephone Encounter (Signed)
 STAT if HR is under 50 or over 120 (normal HR is 60-100 beats per minute)  What is your heart rate? 109 - Sitting  Do you have a log of your heart rate readings (document readings)? no  Do you have any other symptoms? Slight headache

## 2024-05-25 NOTE — Telephone Encounter (Signed)
 Called patient back about message. Patient she has had several of these episodes this summer that last one was about a month ago until last night.  This time it started last night, HR 100 to 111, had palpitations, some headaches, weakness  and denies any chest pain or SOB. Patient stated her most recent  BP 117/73  HR 105. Patient stated she takes metoprolol  tartrate 50 mg 1 tablet in the morning and 25 mg 1/2 tablet in the evening. Patient state the last two times she had an episode she took the other half of her metoprolol  and her HR came down. Will send message to Mercy Hails PA the patient has seen patient is the past for advisement. Put patient on schedule to see DOD tomorrow. Dr. Swazil[place] is out of the office this week.

## 2024-05-26 ENCOUNTER — Ambulatory Visit

## 2024-05-26 ENCOUNTER — Ambulatory Visit: Attending: Cardiology | Admitting: Cardiology

## 2024-05-26 ENCOUNTER — Encounter: Payer: Self-pay | Admitting: Cardiology

## 2024-05-26 VITALS — BP 120/78 | HR 108 | Resp 16 | Ht 63.0 in | Wt 157.4 lb

## 2024-05-26 DIAGNOSIS — I10 Essential (primary) hypertension: Secondary | ICD-10-CM

## 2024-05-26 DIAGNOSIS — I480 Paroxysmal atrial fibrillation: Secondary | ICD-10-CM

## 2024-05-26 DIAGNOSIS — E782 Mixed hyperlipidemia: Secondary | ICD-10-CM

## 2024-05-26 DIAGNOSIS — Z79899 Other long term (current) drug therapy: Secondary | ICD-10-CM

## 2024-05-26 DIAGNOSIS — I2510 Atherosclerotic heart disease of native coronary artery without angina pectoris: Secondary | ICD-10-CM

## 2024-05-26 DIAGNOSIS — R9431 Abnormal electrocardiogram [ECG] [EKG]: Secondary | ICD-10-CM

## 2024-05-26 DIAGNOSIS — I5032 Chronic diastolic (congestive) heart failure: Secondary | ICD-10-CM

## 2024-05-26 NOTE — Progress Notes (Unsigned)
 Enrolled for [employer] to mail a ZIO XT long term holter monitor to the patients address on file.

## 2024-05-26 NOTE — Patient Instructions (Signed)
 Medication Instructions:  Your physician recommends that you continue on your current medications as directed. Please refer to the Current Medication list given to you today.  *If you need a refill on your cardiac medications before your next appointment, please call your pharmacy*  Lab Work: Today: BMP, BNP & Magnesium level  If you have any lab test that is abnormal or we need to change your treatment, we will call you to review the results.  Testing/Procedures: Your physician has requested that you have an echocardiogram. Echocardiography is a painless test that uses sound waves to create images of your heart. It provides your doctor with information about the size and shape of your heart and how well your heart's chambers and valves are working. This procedure takes approximately one hour. There are no restrictions for this procedure. Please do NOT wear cologne, perfume, aftershave, or lotions (deodorant is allowed). Please arrive 15 minutes prior to your appointment time.  Please note: We ask at that you not bring children with you during ultrasound (echo/ vascular) testing. Due to room size and safety concerns, children are not allowed in the ultrasound rooms during exams. Our front office staff cannot provide observation of children in our lobby area while testing is being conducted. An adult accompanying a patient to their appointment will only be allowed in the ultrasound room at the discretion of the ultrasound technician under special circumstances. We apologize for any inconvenience.                            ZIO XT- Long Term Monitor Instructions  Your physician has requested you wear a ZIO patch monitor for 14 days.  This is a single patch monitor. [employer] supplies one patch monitor per enrollment. Additional stickers are not available. Please do not apply patch if you will be having a Nuclear Stress Test,  Echocardiogram, Cardiac CT, MRI, or Chest Xray during the period you  would be wearing the  monitor. The patch cannot be worn during these tests. You cannot remove and re-apply the  ZIO XT patch monitor.  Your ZIO patch monitor will be mailed 3 day USPS to your address on file. It may take 3-5 days  to receive your monitor after you have been enrolled.  Once you have received your monitor, please review the enclosed instructions. Your monitor  has already been registered assigning a specific monitor serial # to you.  Billing and Patient Assistance Program Information  We have supplied [employer] with any of your insurance information on file for billing purposes. [employer] offers a sliding scale Patient Assistance Program for patients that do not have  insurance, or whose insurance does not completely cover the cost of the ZIO monitor.  You must apply for the Patient Assistance Program to qualify for this discounted rate.  To apply, please call [employer] at [phone], select option 4, select option 2, ask to apply for  Patient Assistance Program. Meredeth will ask your household income, and how many people  are in your household. They will quote your out-of-pocket cost based on that information.  [employer] will also be able to set up a 51-month, interest-free payment plan if needed.  Applying the monitor   Shave hair from upper left chest.  Hold abrader disc by orange tab. Rub abrader in 40 strokes over the upper left chest as  indicated in your monitor instructions.  Clean area with 4 enclosed alcohol pads. Let dry.  Apply  patch as indicated in monitor instructions. Patch will be placed under collarbone on left  side of chest with arrow pointing upward.  Rub patch adhesive wings for 2 minutes. Remove white label marked 1. Remove the white  label marked 2. Rub patch adhesive wings for 2 additional minutes.  While looking in a mirror, press and release button in center of patch. A small green light will  flash 3-4 times. This will be your only indicator that  the monitor has been turned on.  Do not shower for the first 24 hours. You may shower after the first 24 hours.  Press the button if you feel a symptom. You will hear a small click. Record Date, Time and  Symptom in the Patient Logbook.  When you are ready to remove the patch, follow instructions on the last 2 pages of Patient  Logbook. Stick patch monitor onto the last page of Patient Logbook.  Place Patient Logbook in the blue and white box. Use locking tab on box and tape box closed  securely. The blue and white box has prepaid postage on it. Please place it in the mailbox as  soon as possible. Your physician should have your test results approximately 7 days after the  monitor has been mailed back to [facility].  Call [facility] Customer Care at [phone] if you have questions regarding  your ZIO XT patch monitor. Call them immediately if you see an orange light blinking on your  monitor.  If your monitor falls off in less than 4 days, contact our Monitor department at [phone].  If your monitor becomes loose or falls off after 4 days call [employer] at [phone] for  suggestions on securing your monitor      Follow-Up: At [facility], you and your health needs are our priority.  As part of our continuing mission to provide you with exceptional heart care, our providers are all part of one team.  This team includes your primary Cardiologist (physician) and Advanced Practice Providers or APPs (Physician Assistants and Nurse Practitioners) who all work together to provide you with the care you need, when you need it.  Your next appointment:   5 week(s)  Provider:   Peter Swaziland, MD     Thank you for choosing [facility]!!   [phone]

## 2024-05-26 NOTE — Progress Notes (Signed)
 Cardiology Office Note:  .   Date:  05/26/2024  ID:  Kelli [name], DOB 1941/05/02, MRN [id number] PCP:  Chandra Toribio POUR, MD  [facility] Providers Cardiologist:  Peter Swaziland, MD Cardiology APP:  Madie Jon Garre, [place]  Electrophysiologist:  None  Click to update primary MD,subspecialty MD or APP then REFRESH:1}    Chief Complaint  Patient presents with   elevated heart rate    Follow-up    DOD visit    History of Present Illness: .   Kelli [name] is a 83 y.o. Caucasian female whose past medical history and cardiovascular risk factors includes: Hypertension, paroxysmal atrial fibrillation, severe coronary calcification, mild nonobstructive coronary artery disease as per coronary CTA.  Patient follows with Dr. Swaziland and was last seen in July 2025.  Patient presents today for a sick visit and I am seeing her for elevated heart rate on my DOD day.  Patient has history of paroxysmal atrial fibrillation.  History of A-fib with RVR back in 2014 based on EMR.  Currently on metoprolol  for rate control strategy and Eliquis  for thromboembolic prophylaxis.  Patient is accompanied by her niece at today's office visit who also provides collateral history.  Since 05/24/2024 patient has been experiencing elevated heart rate.  Not necessarily palpitations but her smart watch alerts her that her pulse is greater than 100 bpm but less than 115 bpm.  Upon further questioning patient states that at times she does experience fluttering in the chest but no associated lightheaded, dizziness, near-syncope or syncopal event.  She does feel tired and fatigued since May 24, 2024.  Patient is compliant with her Eliquis  without any complications of bleeding.  She used to be on Lopressor  50 mg in the morning and half a tablet in the afternoon but now she is been taking it 50 mg twice daily.  Patient denies anginal chest pain.  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  But she continues to work as she enjoys it.  Review of Systems: .   Review of Systems  Constitutional: Positive for malaise/fatigue.  Cardiovascular:  Positive for palpitations. Negative for chest pain, claudication, irregular heartbeat, leg swelling, near-syncope, orthopnea, paroxysmal nocturnal dyspnea and syncope.  Respiratory:  Positive for shortness of breath (chronic and stable).   Hematologic/Lymphatic: Negative for bleeding problem.  Neurological:  Positive for headaches (chronic).    Studies Reviewed:   EKG: EKG Interpretation Date/Time:  Thursday May 26 2024 15:49:37 EDT Ventricular Rate:  108 PR Interval:  208 QRS Duration:  82 QT Interval:  340 QTC Calculation: 455 R Axis:   71  Text Interpretation: Sinus tachycardia with prolonged AV conduction When compared with ECG of 31-Jan-2024 16:55, Vent. rate has increased BY  43 BPM Inverted T waves have replaced nonspecific T wave abnormality in Inferior leads Confirmed by Michele Richardson [phone]) on 05/26/2024 4:00:53 PM  Echocardiogram: 03/2023  1. Left ventricular ejection fraction, by estimation, is 60 to 65%. The  left ventricle has normal function. The left ventricle has no regional  wall motion abnormalities. There is mild left ventricular hypertrophy.  Left ventricular diastolic parameters  are consistent with Grade II diastolic dysfunction (pseudonormalization).   2. Right ventricular systolic function is normal. The right ventricular  size is normal. There is normal pulmonary artery systolic pressure.   3. Left atrial size was moderately dilated.   4. The mitral valve is  degenerative. Mild to moderate mitral valve  regurgitation.   5. Tricuspid valve regurgitation is mild to moderate.   6. The aortic valve is grossly normal. Aortic valve regurgitation is not  visualized.   7. The inferior vena cava is normal in size with greater than 50%   respiratory variability, suggesting right atrial pressure of 3 mmHg.   CCTA  04/25/2023 1. Coronary calcium  score of 661. This was 10 percentile for age-, sex, and race-matched controls.   2. Total plaque volume 577 mm3 which is 59 percentile for age- and sex-matched controls (calcified plaque 128 mm3; non-calcified plaque 449 mm3). TPV is (severe).   3. Normal coronary origin with right dominance.   4. Mild coronary artery disease in the LAD, diagonal, Lcx and RCA as outlined.   5. Aortic atherosclerosis.   6. LAE; cannot R/O left atrial appendage thrombus.   RECOMMENDATIONS: CAD-RADS 2: Mild non-obstructive CAD (25-49%). Consider non-atherosclerotic causes of chest pain. Consider preventive therapy and risk factor modification.  Risk Assessment/Calculations:   NA   Labs:       Latest Ref Rng & Units 02/12/2024    9:50 AM 02/03/2024   10:56 AM 04/07/2023   11:25 AM  CBC  WBC 3.4 - 10.8 x10E3/uL 8.3  6.5  8.0   Hemoglobin 11.1 - 15.9 g/dL 86.8  86.8  87.4   Hematocrit 34.0 - 46.6 % 40.5  39.3  38.5   Platelets 150 - 450 x10E3/uL 297  260  241        Latest Ref Rng & Units 02/12/2024    9:50 AM 02/03/2024   10:56 AM 01/11/2024    9:18 AM  BMP  Glucose 70 - 99 mg/dL 878  87  90   BUN 8 - 27 mg/dL 27  32  28   Creatinine 0.57 - 1.00 mg/dL 8.73  8.29  8.79   BUN/Creat Ratio 12 - 28 21  19  23    Sodium 134 - 144 mmol/L 138  132  142   Potassium 3.5 - 5.2 mmol/L 4.3  4.8  5.0   Chloride 96 - 106 mmol/L 99  97  107   CO2 20 - 29 mmol/L 24  21  22    Calcium  8.7 - 10.3 mg/dL 9.2  8.9  8.9       Latest Ref Rng & Units 02/12/2024    9:50 AM 02/03/2024   10:56 AM 01/11/2024    9:18 AM  CMP  Glucose 70 - 99 mg/dL 878  87  90   BUN 8 - 27 mg/dL 27  32  28   Creatinine 0.57 - 1.00 mg/dL 8.73  8.29  8.79   Sodium 134 - 144 mmol/L 138  132  142   Potassium 3.5 - 5.2 mmol/L 4.3  4.8  5.0   Chloride 96 - 106 mmol/L 99  97  107   CO2 20 - 29 mmol/L 24  21  22    Calcium   8.7 - 10.3 mg/dL 9.2  8.9  8.9   Total Protein 6.0 - 8.5 g/dL 6.9  6.9  6.9   Total Bilirubin 0.0 - 1.2 mg/dL 0.4  0.3  0.5   Alkaline Phos 44 - 121 IU/L 75  66  92   AST 0 - 40 IU/L 32  48  32   ALT 0 - 32 IU/L 24  39  25     Lab Results  Component Value Date   CHOL 151 01/11/2024  HDL 48 01/11/2024   LDLCALC 86 01/11/2024   TRIG 90 01/11/2024   CHOLHDL 3.1 01/11/2024   No results for input(s): LIPOA in the last 8760 hours. No components found for: NTPROBNP No results for input(s): PROBNP in the last 8760 hours. Recent Labs    02/03/24 1056  TSH 1.370    Physical Exam:    Today's Vitals   05/26/24 1545  BP: 120/78  Pulse: (!) 108  Resp: 16  SpO2: 96%  Weight: 157 lb 6.4 oz (71.4 kg)  Height: 5' 3 (1.6 m)   Body mass index is 27.88 kg/m. Wt Readings from Last 3 Encounters:  05/26/24 157 lb 6.4 oz (71.4 kg)  04/08/24 157 lb 4 oz (71.3 kg)  02/02/24 161 lb 4 oz (73.1 kg)    Physical Exam  Constitutional: No distress.  hemodynamically stable  Neck: No JVD present.  Cardiovascular: Regular rhythm, S1 normal and S2 normal. Tachycardia present. Exam reveals no gallop, no S3 and no S4.  No murmur heard. Pulmonary/Chest: Effort normal and breath sounds normal. No stridor. She has no wheezes. She has no rales.  Musculoskeletal:        General: No edema.     Cervical back: Neck supple.  Skin: Skin is warm.   Impression & Recommendation(s):  Impression:   ICD-10-CM   1. PAF (paroxysmal atrial fibrillation) (HCC)  I48.0 EKG 12-Lead    LONG TERM MONITOR (3-14 DAYS)    TSH    2. Coronary artery disease involving native coronary artery of native heart without angina pectoris  I25.10     3. Chronic diastolic heart failure (HCC)  P49.67 Basic Metabolic Panel (BMET)    Pro b natriuretic peptide (BNP)    Magnesium    4. Benign hypertension  I10     5. Mixed hyperlipidemia  E78.2     6. Medication management  Z79.899 TSH    Basic Metabolic Panel (BMET)     Pro b natriuretic peptide (BNP)    Magnesium    7. Nonspecific abnormal electrocardiogram (ECG) (EKG)  R94.31 ECHOCARDIOGRAM COMPLETE       Recommendation(s):  PAF (paroxysmal atrial fibrillation) (HCC) Presents to the office for DOD visit due to increased heart rate since 05/24/2024 She has increased her Lopressor  to 50 mg p.o. twice daily without any significant improvement in symptoms. No precipitating factors identified. EKG today illustrates sinus tachycardia. Check TSH. Will order a 14-day Zio patch to evaluate for dysrhythmias, A-fib burden, etc. If she is symptomatic patient is advised to take Lopressor  50 mg 3 times daily. If she has any accompanying lightheadedness, dizziness, near-syncope or syncopal event she should go to the ER for further evaluation and management.  Coronary artery disease involving native coronary artery of native heart without angina pectoris Coronary CTA August 2024 noted severe CAC, severe total plaque volume, and mild nonobstructive CAD. Clinically she denies anginal chest pain. Continue antiplatelet therapy. EKG today illustrates subtle T WI in inferior leads, clinically asymptomatic Echo will be ordered to evaluate for structural heart disease and left ventricular systolic function.  Chronic diastolic heart failure (HCC) Clinically euvolemic. According to EMR her heart failure symptoms are usually precipitated by her A-fib events. Continue losartan  50 mg p.o. daily, recommended taking it at night Continue Jardiance  10 mg p.o. every morning. Continue hydrochlorothiazide  12.5 mg p.o. every morning Will check baseline BMP, magnesium, NT proBNP. Strict I's and O's, daily weights  Benign hypertension Office blood pressures are very well-controlled. Medications as discussed above  Mixed hyperlipidemia Continue Lipitor 10 mg p.o. daily  Orders Placed:  Orders Placed This Encounter  Procedures   TSH    Remote health to draw?:   No   Basic  Metabolic Panel (BMET)    Remote health to draw?:   No   Pro b natriuretic peptide (BNP)   Magnesium    Remote health to draw?:   No   LONG TERM MONITOR (3-14 DAYS)    Standing Status:   Future    Number of Occurrences:   1    Expiration Date:   05/26/2025    Where should this test be performed?:   [facility]    Does the patient have an implanted cardiac device?:   No    Prescribed days of wear:   14    Type of enrollment:   Home Enrollment    Vendor::   [employer]    Reason for Exam:   A-fib, Unspecified I48.91   EKG 12-Lead   ECHOCARDIOGRAM COMPLETE    Standing Status:   Future    Expiration Date:   05/26/2025    Where [facility] this [facility] performed[facility]    Does the patient weigh less than or greater than 250 lbs?:   Patient weighs less than 250 lbs    Perflutren DEFINITY (image enhancing agent) should be administered unless hypersensitivity or allergy exist:   Administer Perflutren    Reason for exam-Echo:   Abnormal ECG  R94.31     Final Medication List:   No orders of the defined types were placed in this encounter.   Medications Discontinued During This Encounter  Medication Reason   methylcellulose (CITRUCEL) oral powder Patient Preference     Current Outpatient Medications:    acetaminophen  (TYLENOL ) 500 MG tablet, Take 500 mg by mouth daily as needed for pain., Disp: , Rfl:    apixaban  (ELIQUIS ) 5 MG TABS tablet, TAKE 1 TABLET BY MOUTH 2 TIMES DAILY., Disp: 60 tablet, Rfl: 5   atorvastatin  (LIPITOR) 10 MG tablet, Take 1 tablet (10 mg total) by mouth daily., Disp: 90 tablet, Rfl: 3   Biotin 1 MG CAPS, Take 1 tablet by mouth at bedtime., Disp: , Rfl:    Calcium  Carb-Cholecalciferol  (CALCIUM  + D3) 600-200 MG-UNIT TABS, Take 1 tablet by mouth daily., Disp: , Rfl:    Cholecalciferol  (VITAMIN D ) 400 UNITS capsule, Take 400 Units by mouth daily., Disp: , Rfl:    ELDERBERRY PO, Take by mouth., Disp: , Rfl:    empagliflozin  (JARDIANCE ) 10 MG TABS tablet, Take 1  tablet (10 mg total) by mouth daily before breakfast., Disp: 90 tablet, Rfl: 3   famotidine  (PEPCID ) 20 MG tablet, Take 1 tablet (20 mg total) by mouth daily., Disp: 30 tablet, Rfl: 1   fish oil-omega-3 fatty acids  1000 MG capsule, Take 1 g by mouth daily., Disp: , Rfl:    Flaxseed, Linseed, (FLAX SEED OIL PO), Take by mouth daily., Disp: , Rfl:    fluticasone  (FLONASE ) 50 MCG/ACT nasal spray, Place 2 sprays into both nostrils daily., Disp: 16 g, Rfl: 6   hydrochlorothiazide  (HYDRODIURIL ) 12.5 MG tablet, TAKE 1 TABLET (12.5 MG TOTAL) BY MOUTH DAILY., Disp: 90 tablet, Rfl: 1   losartan  (COZAAR ) 50 MG tablet, TAKE 1 TABLET (50 MG TOTAL) BY MOUTH DAILY., Disp: 90 tablet, Rfl: 1   Magnesium 300 MG CAPS, Take 1 capsule by mouth daily., Disp: , Rfl:    metoprolol  tartrate (LOPRESSOR ) 50 MG tablet, TAKE  1 TABLET BY MOUTH IN THE MORNING AND TAKE 1/2 TABLET IN THE AFTERNOON, Disp: 135 tablet, Rfl: 3   ondansetron  (ZOFRAN -ODT) 4 MG disintegrating tablet, Take 1 tablet (4 mg total) by mouth every 8 (eight) hours as needed for nausea or vomiting., Disp: 10 tablet, Rfl: 0   Probiotic Product (PROBIOTIC DAILY PO), Take 1 tablet by mouth daily as needed. , Disp: , Rfl:    vitamin C  (ASCORBIC ACID ) 500 MG tablet, Take 500 mg by mouth daily., Disp: , Rfl:   Consent:   NA  Disposition:   Arrange follow-up with Dr. Swaziland in 4 to 5 weeks.  Her questions and concerns were addressed to her satisfaction. She voices understanding of the recommendations provided during this encounter.    Signed, Madonna Michele HAS, [facility]  A Division of [facility] [address] [place], [place] [address]  05/26/2024 6:32 PM

## 2024-05-27 ENCOUNTER — Ambulatory Visit: Payer: Self-pay | Admitting: Cardiology

## 2024-05-27 LAB — BASIC METABOLIC PANEL WITH GFR
BUN/Creatinine Ratio: 21 (ref 12–28)
BUN: 31 mg/dL — ABNORMAL HIGH (ref 8–27)
CO2: 24 mmol/L (ref 20–29)
Calcium: 9.3 mg/dL (ref 8.7–10.3)
Chloride: 101 mmol/L (ref 96–106)
Creatinine, Ser: 1.47 mg/dL — ABNORMAL HIGH (ref 0.57–1.00)
Glucose: 78 mg/dL (ref 70–99)
Potassium: 5.5 mmol/L — ABNORMAL HIGH (ref 3.5–5.2)
Sodium: 139 mmol/L (ref 134–144)
eGFR: 35 mL/min/1.73 — ABNORMAL LOW (ref >59)

## 2024-05-27 LAB — MAGNESIUM: Magnesium: 2.3 mg/dL (ref 1.6–2.3)

## 2024-05-27 LAB — TSH: TSH: 2.06 u[IU]/mL (ref 0.450–4.500)

## 2024-05-27 MED ORDER — LOSARTAN POTASSIUM 25 MG PO TABS: 25.0000 mg | ORAL_TABLET | Freq: Every day | ORAL | 1 refills | Status: DC

## 2024-05-27 NOTE — Telephone Encounter (Signed)
 Kelli Richardson, DO   05/27/24  3:09 PM Result Note Please have her increase fluid intake by 2 to 3 glasses/day. Decrease losartan  to 25 mg p.o. daily Repeat BMP in 1 week to make sure renal function has improved and potassium levels have returned to baseline. Please encourage her to reduce foods that are high in potassium content and make sure she is not on over-the-counter potassium supplements.   Dr. Michele [name] patient back about message. Patient given results and instructions. Patient verbalized understanding.

## 2024-05-27 NOTE — Telephone Encounter (Signed)
 Kelli [name],  I saw her on DOD visit yesterday.  Please call her w/ the results note.   [name] [place], DO, FACC

## 2024-06-03 ENCOUNTER — Other Ambulatory Visit: Payer: Self-pay

## 2024-06-03 DIAGNOSIS — E875 Hyperkalemia: Secondary | ICD-10-CM

## 2024-06-03 LAB — BASIC METABOLIC PANEL WITH GFR
BUN/Creatinine Ratio: 24 (ref 12–28)
BUN: 40 mg/dL — ABNORMAL HIGH (ref 8–27)
CO2: 21 mmol/L (ref 20–29)
Calcium: 8.8 mg/dL (ref 8.7–10.3)
Chloride: 100 mmol/L (ref 96–106)
Creatinine, Ser: 1.65 mg/dL — ABNORMAL HIGH (ref 0.57–1.00)
Glucose: 110 mg/dL — ABNORMAL HIGH (ref 70–99)
Potassium: 3.8 mmol/L (ref 3.5–5.2)
Sodium: 136 mmol/L (ref 134–144)
eGFR: 31 mL/min/1.73 — ABNORMAL LOW (ref >59)

## 2024-06-07 ENCOUNTER — Ambulatory Visit: Payer: Self-pay | Admitting: Cardiology

## 2024-06-07 ENCOUNTER — Ambulatory Visit (HOSPITAL_COMMUNITY)
Admission: RE | Admit: 2024-06-07 | Discharge: 2024-06-07 | Disposition: A | Discharge status: Home / Self Care | Source: Ambulatory Visit | Attending: Cardiology | Admitting: Cardiology

## 2024-06-07 DIAGNOSIS — R9431 Abnormal electrocardiogram [ECG] [EKG]: Secondary | ICD-10-CM | POA: Diagnosis present

## 2024-06-07 DIAGNOSIS — I5032 Chronic diastolic (congestive) heart failure: Secondary | ICD-10-CM

## 2024-06-07 LAB — ECHOCARDIOGRAM COMPLETE: S' Lateral: 2.6 cm

## 2024-06-08 NOTE — Telephone Encounter (Signed)
 Spoke with pt, she understands the directions regarding the medications, she questions the losartan  dose, she reports dr michele told her to take 1/2 of the 25 mg tablet so does she go back to the 25 mg or continue on the 1/2 of the losartan . Aware will forward to dr michele for recommendations.

## 2024-06-14 ENCOUNTER — Ambulatory Visit: Payer: Self-pay

## 2024-06-14 NOTE — Telephone Encounter (Signed)
 FYI Only or Action Required?: FYI only for provider.  Patient was last seen in primary care on 02/02/2024 by Chandra Toribio POUR, MD.  Called Nurse Triage reporting Shortness of Breath.  Symptoms began about a month ago.  Interventions attempted: Nothing.  Symptoms are: gradually worsening.  Triage Disposition: See PCP When Office is Open (Within 3 Days)  Patient/caregiver understands and will follow disposition?: Yes     Copied from CRM [id number]. Topic: Clinical - Red Word Triage >> Jun 14, 2024 12:38 PM Shardie S wrote: [facility] that prompted transfer to Nurse Triage: shortness of breath and  heart rate 60-130 Reason for Disposition  [1] MODERATE longstanding difficulty breathing (e.g., speaks in phrases, SOB even at rest, pulse 100-120) AND [2] SAME as normal  Answer Assessment - Initial Assessment Questions Patient states symptoms have been increasing now while just sitting still. Patient states her BP has been ok but has not taken it in a few days. Patient denies chest pain but has tightness in upper abdomen area and sinus headache symptoms.     1. RESPIRATORY STATUS: Describe your breathing? (e.g., wheezing, shortness of breath, unable to speak, severe coughing)      Patient states she feels like she is going in slow motion.  2. ONSET: When did this breathing problem begin?       1 month ago 3. PATTERN Does the difficult breathing come and go, or has it been constant since it started?      Comes and goes  4. SEVERITY: How bad is your breathing? (e.g., mild, moderate, severe)      Moderate  5. RECURRENT SYMPTOM: Have you had difficulty breathing before? If Yes, ask: When was the last time? and What happened that time?      Yes, saw PCP for symptoms.  6. CARDIAC HISTORY: Do you have any history of heart disease? (e.g., heart attack, angina, bypass surgery, angioplasty)      Afib and states they found some nodules  7. LUNG HISTORY: Do you have any history of  lung disease?  (e.g., pulmonary embolus, asthma, emphysema)     Denies  8. OTHER SYMPTOMS: Do you have any other symptoms? (e.g., chest pain, cough, dizziness, fever, runny nose)     Heart rate between 60-130, tightness underneath breast, pressure headache   9. O2 SATURATION MONITOR:  Do you use an oxygen saturation monitor (pulse oximeter) at home? If Yes, ask: What is your reading (oxygen level) today? What is your usual oxygen saturation reading? (e.g., 95%)       Unable to monitor  Protocols used: Breathing Difficulty-A-AH

## 2024-06-15 ENCOUNTER — Ambulatory Visit (INDEPENDENT_AMBULATORY_CARE_PROVIDER_SITE_OTHER): Admitting: Family Medicine

## 2024-06-15 ENCOUNTER — Encounter: Payer: Self-pay | Admitting: Family Medicine

## 2024-06-15 VITALS — BP 139/93 | HR 104 | Ht 63.0 in | Wt 168.0 lb

## 2024-06-15 DIAGNOSIS — I5032 Chronic diastolic (congestive) heart failure: Secondary | ICD-10-CM | POA: Diagnosis not present

## 2024-06-15 DIAGNOSIS — R0602 Shortness of breath: Secondary | ICD-10-CM

## 2024-06-15 NOTE — Assessment & Plan Note (Signed)
 Presents with worsening dyspnea on exertion and at rest, orthopnea, a 10 lb weight gain, and lower extremity edema since stopping hydrochlorothiazide  last week on the advice of cardiologyd d/t worsening Creatinine. Examination is significant for pulmonary crackles and leg edema - Restart hydrochlorothiazide . - Obtain chest x-ray today to evaluate for pulmonary edema. - Obtain stat BMP to check creatinine level before restarting diuretic. Will draw labs in office if possible. - Check weight in office. - Patient to continue daily weight monitoring at home.

## 2024-06-15 NOTE — Patient Instructions (Addendum)
 It was nice to see you today,  We addressed the following topics today: -I would like you to start taking hydrochlorothiazide  again.  I believe you are fluid overloaded. - I would like you to come back in to have your blood drawn so we can get your kidney function tested.  Have a great day,  Rolan Slain, MD

## 2024-06-15 NOTE — Progress Notes (Unsigned)
   Acute Office Visit  Subjective:     Patient ID: Kelli [name], female    DOB: 1940-11-12, 83 y.o.   MRN: [id number]  Chief Complaint  Patient presents with   Shortness of Breath    HPI Patient is in today for   Subjective - Worsening shortness of breath since last Saturday, occurring with minimal exertion and at rest, including when lying down to sleep. Reports no strength. Walked a short distance at work and could not catch breath. - Reports 5 lb weight gain since Saturday after stopping hydrochlorothiazide . Previous weight was 158 lbs, home weight is now 165 lbs. Had previously lost 15 lbs. - Reports puffy ankles and abdominal bloating. - Reports variable heart rate, ranging from 60s to 134 bpm. Completed Zio patch monitoring, returned yesterday. - Reports recurrent headache described as a pressure feeling, which is now worse, especially with exertion. - Reports recurrent abnormal taste in mouth, described as brine-like, which has been worse in the last few days.  Medications Medications discussed include hydrochlorothiazide  (stopped on Thursday per cardiology), losartan  (dose halved to half a pill daily due to high potassium on 06/25/2024), and metoprolol  (cardiologist suggested possibly increasing to three times a day after heart monitor review).  PMH, PSH, FH, Social Hx PMHx: Elevated creatinine (chronically), hyperkalemia (resolved on repeat labs). Social Hx: Works in a daycare.  ROS Constitutional: Reports no strength. Denies fever. HEENT: Reports pressure-like headache and abnormal taste. Reports minor allergy symptoms, using saline. Denies sore throat. Cardiovascular: Reports exertional dyspnea, shortness of breath at rest, and variable heart rate. Respiratory: Denies cough. Musculoskeletal: Reports bilateral ankle edema.    ROS      Objective:    BP (!) 139/93   Pulse (!) 104   Ht 5' 3 (1.6 m)   Wt 168 lb (76.2 kg)   SpO2 96%   BMI 29.76 kg/m   {Vitals History (Optional):23777}  Physical Exam Gen: alert, oriented PULM: mild bibasilar inspiratory Crackles noted on auscultation, suggestive of fluid. EXTREMITIES: b/l  edema noted in the legs.  Psych: pleasant affect  No results found for any visits on 06/15/24.      Assessment & Plan:   Shortness of breath -     DG Chest 2 View; Future -     Basic metabolic panel with GFR; Future -     Pro b natriuretic peptide (BNP); Future  Chronic diastolic congestive heart failure (HCC) Assessment & Plan: Presents with worsening dyspnea on exertion and at rest, orthopnea, a 10 lb weight gain, and lower extremity edema since stopping hydrochlorothiazide  last week on the advice of cardiologyd d/t worsening Creatinine. Examination is significant for pulmonary crackles and leg edema - Restart hydrochlorothiazide . - Obtain chest x-ray today to evaluate for pulmonary edema. - Obtain stat BMP to check creatinine level before restarting diuretic. Will draw labs in office if possible. - Check weight in office. - Patient to continue daily weight monitoring at home.      No follow-ups on file.  Kelli MARLA Slain, MD

## 2024-06-16 ENCOUNTER — Other Ambulatory Visit

## 2024-06-16 DIAGNOSIS — R0602 Shortness of breath: Secondary | ICD-10-CM

## 2024-06-17 ENCOUNTER — Inpatient Hospital Stay (HOSPITAL_COMMUNITY)

## 2024-06-17 ENCOUNTER — Ambulatory Visit: Payer: Self-pay | Admitting: Family Medicine

## 2024-06-17 ENCOUNTER — Other Ambulatory Visit: Payer: Self-pay | Admitting: Physician Assistant

## 2024-06-17 ENCOUNTER — Inpatient Hospital Stay (HOSPITAL_COMMUNITY)
Admission: AD | Admit: 2024-06-17 | Discharge: 2024-06-20 | DRG: 291 | Disposition: A | Discharge status: Home / Self Care | Source: Ambulatory Visit | Attending: Cardiology | Admitting: Cardiology

## 2024-06-17 ENCOUNTER — Ambulatory Visit: Attending: Physician Assistant | Admitting: Physician Assistant

## 2024-06-17 ENCOUNTER — Encounter (HOSPITAL_COMMUNITY): Payer: Self-pay

## 2024-06-17 VITALS — BP 147/74 | HR 85 | Ht 63.0 in | Wt 169.0 lb

## 2024-06-17 DIAGNOSIS — Z79899 Other long term (current) drug therapy: Secondary | ICD-10-CM

## 2024-06-17 DIAGNOSIS — E782 Mixed hyperlipidemia: Secondary | ICD-10-CM

## 2024-06-17 DIAGNOSIS — I5033 Acute on chronic diastolic (congestive) heart failure: Secondary | ICD-10-CM | POA: Diagnosis present

## 2024-06-17 DIAGNOSIS — N179 Acute kidney failure, unspecified: Secondary | ICD-10-CM | POA: Diagnosis present

## 2024-06-17 DIAGNOSIS — I5032 Chronic diastolic (congestive) heart failure: Secondary | ICD-10-CM | POA: Diagnosis not present

## 2024-06-17 DIAGNOSIS — I10 Essential (primary) hypertension: Secondary | ICD-10-CM | POA: Diagnosis not present

## 2024-06-17 DIAGNOSIS — E785 Hyperlipidemia, unspecified: Secondary | ICD-10-CM | POA: Diagnosis present

## 2024-06-17 DIAGNOSIS — N1832 Chronic kidney disease, stage 3b: Secondary | ICD-10-CM | POA: Diagnosis present

## 2024-06-17 DIAGNOSIS — E1122 Type 2 diabetes mellitus with diabetic chronic kidney disease: Secondary | ICD-10-CM | POA: Diagnosis present

## 2024-06-17 DIAGNOSIS — I5043 Acute on chronic combined systolic (congestive) and diastolic (congestive) heart failure: Secondary | ICD-10-CM

## 2024-06-17 DIAGNOSIS — R9431 Abnormal electrocardiogram [ECG] [EKG]: Secondary | ICD-10-CM | POA: Diagnosis not present

## 2024-06-17 DIAGNOSIS — I130 Hypertensive heart and chronic kidney disease with heart failure and stage 1 through stage 4 chronic kidney disease, or unspecified chronic kidney disease: Principal | ICD-10-CM | POA: Diagnosis present

## 2024-06-17 DIAGNOSIS — I4892 Unspecified atrial flutter: Secondary | ICD-10-CM | POA: Diagnosis present

## 2024-06-17 DIAGNOSIS — Z7901 Long term (current) use of anticoagulants: Secondary | ICD-10-CM | POA: Diagnosis not present

## 2024-06-17 DIAGNOSIS — R911 Solitary pulmonary nodule: Secondary | ICD-10-CM | POA: Diagnosis present

## 2024-06-17 DIAGNOSIS — N183 Chronic kidney disease, stage 3 unspecified: Secondary | ICD-10-CM | POA: Diagnosis not present

## 2024-06-17 DIAGNOSIS — I509 Heart failure, unspecified: Principal | ICD-10-CM

## 2024-06-17 DIAGNOSIS — I2510 Atherosclerotic heart disease of native coronary artery without angina pectoris: Secondary | ICD-10-CM | POA: Diagnosis present

## 2024-06-17 DIAGNOSIS — I4819 Other persistent atrial fibrillation: Secondary | ICD-10-CM | POA: Diagnosis not present

## 2024-06-17 DIAGNOSIS — I38 Endocarditis, valve unspecified: Secondary | ICD-10-CM

## 2024-06-17 DIAGNOSIS — I480 Paroxysmal atrial fibrillation: Secondary | ICD-10-CM | POA: Diagnosis present

## 2024-06-17 DIAGNOSIS — I4891 Unspecified atrial fibrillation: Secondary | ICD-10-CM | POA: Diagnosis not present

## 2024-06-17 DIAGNOSIS — I2489 Other forms of acute ischemic heart disease: Secondary | ICD-10-CM | POA: Diagnosis present

## 2024-06-17 LAB — COMPREHENSIVE METABOLIC PANEL WITH GFR
ALT: 64 U/L — ABNORMAL HIGH (ref 0–44)
AST: 65 U/L — ABNORMAL HIGH (ref 15–41)
Albumin: 3.2 g/dL — ABNORMAL LOW (ref 3.5–5.0)
Alkaline Phosphatase: 73 U/L (ref 38–126)
Anion gap: 10 (ref 5–15)
BUN: 34 mg/dL — ABNORMAL HIGH (ref 8–23)
CO2: 21 mmol/L — ABNORMAL LOW (ref 22–32)
Calcium: 8.6 mg/dL — ABNORMAL LOW (ref 8.9–10.3)
Chloride: 104 mmol/L (ref 98–111)
Creatinine, Ser: 1.52 mg/dL — ABNORMAL HIGH (ref 0.44–1.00)
GFR, Estimated: 34 mL/min — ABNORMAL LOW (ref >60)
Glucose, Bld: 117 mg/dL — ABNORMAL HIGH (ref 70–99)
Potassium: 3.7 mmol/L (ref 3.5–5.1)
Sodium: 135 mmol/L (ref 135–145)
Total Bilirubin: 0.9 mg/dL (ref 0.0–1.2)
Total Protein: 6.4 g/dL — ABNORMAL LOW (ref 6.5–8.1)

## 2024-06-17 LAB — CBC
HCT: 38.8 % (ref 36.0–46.0)
Hemoglobin: 12.9 g/dL (ref 12.0–15.0)
MCH: 31.2 pg (ref 26.0–34.0)
MCHC: 33.2 g/dL (ref 30.0–36.0)
MCV: 93.7 fL (ref 80.0–100.0)
Platelets: 221 K/uL (ref 150–400)
RBC: 4.14 MIL/uL (ref 3.87–5.11)
RDW: 14.5 % (ref 11.5–15.5)
WBC: 8.5 K/uL (ref 4.0–10.5)
nRBC: 0 % (ref 0.0–0.2)

## 2024-06-17 LAB — BASIC METABOLIC PANEL WITH GFR
BUN/Creatinine Ratio: 14 (ref 12–28)
BUN: 22 mg/dL (ref 8–27)
CO2: 19 mmol/L — ABNORMAL LOW (ref 20–29)
Calcium: 8.8 mg/dL (ref 8.7–10.3)
Chloride: 100 mmol/L (ref 96–106)
Creatinine, Ser: 1.56 mg/dL — ABNORMAL HIGH (ref 0.57–1.00)
Glucose: 87 mg/dL (ref 70–99)
Potassium: 4.6 mmol/L (ref 3.5–5.2)
Sodium: 136 mmol/L (ref 134–144)
eGFR: 33 mL/min/1.73 — ABNORMAL LOW (ref >59)

## 2024-06-17 LAB — PRO B NATRIURETIC PEPTIDE: NT-Pro BNP: 5752 pg/mL — ABNORMAL HIGH (ref 0–738)

## 2024-06-17 LAB — BRAIN NATRIURETIC PEPTIDE: B Natriuretic Peptide: 856.2 pg/mL — ABNORMAL HIGH (ref 0.0–100.0)

## 2024-06-17 MED ORDER — ONDANSETRON HCL 4 MG/2ML IJ SOLN: 4.0000 mg | Freq: Four times a day (QID) | INTRAMUSCULAR | Status: DC | PRN

## 2024-06-17 MED ORDER — METOPROLOL TARTRATE 50 MG PO TABS: 50.0000 mg | ORAL_TABLET | Freq: Two times a day (BID) | ORAL | Status: DC

## 2024-06-17 MED ORDER — METOPROLOL TARTRATE 50 MG PO TABS
75.0000 mg | ORAL_TABLET | Freq: Two times a day (BID) | ORAL | Status: DC
  Administered 2024-06-17 – 2024-06-20 (×6): 75 mg via ORAL
  Filled 2024-06-17 (×6): qty 1

## 2024-06-17 MED ORDER — FUROSEMIDE 10 MG/ML IJ SOLN
40.0000 mg | Freq: Two times a day (BID) | INTRAMUSCULAR | Status: DC
  Administered 2024-06-17 – 2024-06-19 (×4): 40 mg via INTRAVENOUS
  Filled 2024-06-17 (×4): qty 4

## 2024-06-17 MED ORDER — FUROSEMIDE 40 MG PO TABS: 40.0000 mg | ORAL_TABLET | Freq: Every day | ORAL | 0 refills | Status: DC

## 2024-06-17 MED ORDER — APIXABAN 5 MG PO TABS
5.0000 mg | ORAL_TABLET | Freq: Two times a day (BID) | ORAL | Status: DC
Start: 2024-06-17 — End: 2024-06-18
  Administered 2024-06-17 – 2024-06-18 (×2): 5 mg via ORAL
  Filled 2024-06-17 (×2): qty 1

## 2024-06-17 MED ORDER — SODIUM CHLORIDE 0.9% FLUSH: 3.0000 mL | INTRAVENOUS | Status: DC | PRN

## 2024-06-17 MED ORDER — DOCUSATE SODIUM 100 MG PO CAPS: 100.0000 mg | ORAL_CAPSULE | Freq: Every day | ORAL | Status: DC | PRN

## 2024-06-17 MED ORDER — FAMOTIDINE 20 MG PO TABS
40.0000 mg | ORAL_TABLET | Freq: Every day | ORAL | Status: DC
  Administered 2024-06-17 – 2024-06-19 (×3): 40 mg via ORAL
  Filled 2024-06-17 (×3): qty 2

## 2024-06-17 MED ORDER — SODIUM CHLORIDE 0.9 % IV SOLN: 250.0000 mL | INTRAVENOUS | Status: AC | PRN

## 2024-06-17 MED ORDER — ATORVASTATIN CALCIUM 10 MG PO TABS
10.0000 mg | ORAL_TABLET | Freq: Every day | ORAL | Status: DC
Start: 2024-06-17 — End: 2024-06-20
  Administered 2024-06-17 – 2024-06-20 (×4): 10 mg via ORAL
  Filled 2024-06-17 (×4): qty 1

## 2024-06-17 MED ORDER — SODIUM CHLORIDE 0.9% FLUSH
3.0000 mL | Freq: Two times a day (BID) | INTRAVENOUS | Status: DC
  Administered 2024-06-17 – 2024-06-20 (×7): 3 mL via INTRAVENOUS

## 2024-06-17 MED ORDER — LOSARTAN POTASSIUM 25 MG PO TABS: 25.0000 mg | ORAL_TABLET | Freq: Every day | ORAL | Status: DC

## 2024-06-17 MED ORDER — ACETAMINOPHEN 325 MG PO TABS
650.0000 mg | ORAL_TABLET | ORAL | Status: DC | PRN
  Administered 2024-06-18 – 2024-06-19 (×2): 650 mg via ORAL
  Filled 2024-06-17 (×2): qty 2

## 2024-06-17 MED ORDER — EMPAGLIFLOZIN 10 MG PO TABS
10.0000 mg | ORAL_TABLET | Freq: Every day | ORAL | Status: DC
  Administered 2024-06-18: 10 mg via ORAL
  Filled 2024-06-17: qty 1

## 2024-06-17 NOTE — Plan of Care (Signed)

## 2024-06-17 NOTE — Progress Notes (Signed)
   Evaluated patient upon arrival to [facility]. Patient reports ongoing shortness of breath and 15 lbs weight gain. BP 128/76. HR in the 90s-100s in atrial fibrillation.   Ordered CMP, BNP, CXR, CBC. Start IV lasix 40 mg BID. For tachycardia, increase home metoprolol  tartrate to 75 mg daily.   She tells me she has been having palpitations, fluctuating HR for about a month. Is adamant that she has been compliant with her anticoagulation. Could consider DCCV when closer to euvolemic   Rollo FABIENE Louder, PA-C 06/17/2024 4:30 PM

## 2024-06-17 NOTE — Progress Notes (Signed)
 Cardiology Office Note   Date:  06/17/2024  ID:  Kelli [name], DOB 1941-05-21, MRN [id number] PCP: Chandra Toribio POUR, MD  [facility] Providers Cardiologist:  Peter Swaz[place], MD Cardiology APP:  Madie Jon Garre, [place]   History of Present Illness Kelli [name] is a 83 y.o. female who with a past medical history of hypertension, paroxysmal atrial fibrillation with RVR who presents today for follow-up appointment.  Was admitted in 2014 and had demand ischemia.  Myoview study was normal.  Echo from [place] 2014 with EF 55 to 60%, grade 1 DD.  Has been on chronic therapy with metoprolol  and Eliquis .  She was seen Summer of 2025 by Jon Madie, PA-C with symptoms of DOE.  Echo showed grade 2 diastolic dysfunction and normal EF.  Moderate MR and mild to moderate TR.  BNP 151.  Coronary CTA showed a high calcium  score of 631 but mild nonobstructive CAD.  There was a questionable left atrial appendage thrombus.  Patient already on Eliquis .  Pulmonary nodule noted with recommendations for follow-up CT at 6 to 12 months.  Mild thickening of esophagus.  She did have repeat CT showing no change in pulmonary nodule, benign.  She did have 1 episode of atrial fibrillation the night prior to her last office visit in July where her heart rate was 122 bpm.  Lasted about an hour and then she converted.  Does note some shortness of breath when walking uphill.  Stated that it was better if she holds her thigh.  Like to have more energy.  No yesterday, she was called regarding her lab work.  Unfortunately she reported shortness of breath and 10 pound weight gain since seeing us  on 9/11.  Stated that mainly her ankles were swollen and puffy.  She just wants to sleep and has no energy.  Also reports tightness under her breast but is not painful.  She was restarted on HCTZ.  She she is set up to see me today to evaluate volume status.  Today, she presents with paroxysmal atrial fibrillation and fluid overload who  presents with worsening shortness of breath and fatigue. She was referred by Dr. Holmes for evaluation of her worsening symptoms.  Over the past week, she experiences worsening shortness of breath and fatigue, particularly when walking. She describes head pressure and a tightening sensation, which she refers to as 'tiredness' and 'heaviness'.  She has atrial fibrillation with heart rate fluctuations between 66 and 134 bpm and episodes of tachycardia noted during an echocardiogram. She experiences 'flutters' even at rest, and her heart rate is irregular. A heart monitor was recently mailed in, but results are pending.  She has fluid overload, with a weight gain of approximately 15 to 20 pounds. Initially, she lost 15 pounds but has since regained the weight. Her ProBNP levels are elevated, and she is on diuretics. Hydrochlorothiazide  was discontinued due to high potassium levels, and she is now prescribed oral Lasix.  Her kidney function has been fluctuating, with creatinine levels ranging from 1.2 to 1.7 over the past months. Her current creatinine is 1.6. Her kidney function is closely monitored due to her fluid status and diuretic use.  Discussed the use of AI scribe software for clinical note transcription with the patient, who gave verbal consent to proceed.   ROS: Pertinent ROS in HPI  Studies Reviewed      Echo 03/18/23: IMPRESSIONS     1. Left ventricular ejection fraction, by estimation, is 60 to 65%. The  left ventricle has normal function. The left ventricle has no regional  wall motion abnormalities. There is mild left ventricular hypertrophy.  Left ventricular diastolic parameters  are consistent with Grade II diastolic dysfunction (pseudonormalization).   2. Right ventricular systolic function is normal. The right ventricular  size is normal. There is normal pulmonary artery systolic pressure.   3. Left atrial size was moderately dilated.   4. The mitral valve is degenerative.  Mild to moderate mitral valve  regurgitation.   5. Tricuspid valve regurgitation is mild to moderate.   6. The aortic valve is grossly normal. Aortic valve regurgitation is not  visualized.   7. The inferior vena cava is normal in size with greater than 50%  respiratory variability, suggesting right atrial pressure of 3 mmHg.    OVER-READ INTERPRETATION  PET-CT CHEST   The following report is an over-read performed by radiologist Dr. Camellia Lang [facility] on 04/25/2023. This over-read does not include interpretation of cardiac or coronary anatomy or pathology. The cardiac CT interpretation by the cardiologist is to be attached.   COMPARISON:  None.   FINDINGS: Mild circumferential wall thickening noted distal esophagus with tiny hiatal hernia evident.   6 mm posterior right middle lobe pulmonary nodule identified on image 18/11.   4 mm low-density lesion in the dome of the liver is too small to characterize but is statistically most likely benign. No followup imaging is recommended.   No suspicious lytic or sclerotic osseous abnormality.   IMPRESSION: 1. 6 mm posterior right middle lobe pulmonary nodule. Non-contrast chest CT at 6-12 months is recommended. If the nodule is stable at time of repeat CT, then future CT at 18-24 months (from today's scan) is considered optional for low-risk patients, but is recommended for high-risk patients. This recommendation follows the consensus statement: Guidelines for Management of Incidental Pulmonary Nodules Detected on CT Images: From the Fleischner Society 2017; Radiology 2017; 284:228-243. 2. Mild circumferential wall thickening distal esophagus with tiny hiatal hernia. Correlation for symptoms of esophagitis recommended.   These results will be called to the ordering clinician or representative by the Radiologist Assistant, and communication documented in the PACS or [employer].     Electronically  Signed   By: Camellia Candle M.D.   On: 04/25/2023 07:44   EXAM: Cardiac/Coronary CTA   TECHNIQUE: A non-contrast, gated CT scan was obtained with axial slices of 3 mm through the heart for calcium  scoring. Calcium  scoring was performed using the Agatston method. A 120 kV prospective, gated, contrast cardiac scan was obtained. Gantry rotation speed was 250 msecs and collimation was 0.6 mm. Two sublingual nitroglycerin  tablets (0.8 mg) were given. The 3D data set was reconstructed in 5% intervals of the 35-75% of the R-R cycle. Diastolic phases were analyzed on a dedicated workstation using MPR, MIP, and VRT modes. The patient received 95 cc of contrast.   FINDINGS: Image quality: Excellent.   Noise artifact is: Limited.   Coronary Arteries:  Normal coronary origin.  Right dominance.   Left main: The left main is a large caliber vessel with a normal take off from the left coronary cusp that bifurcates to form a left anterior descending artery and a left circumflex artery. There is no plaque or stenosis.   Left anterior descending artery: The LAD has minimal (0-24) calcified plaque in the proximal and mid vessel. The LAD gives off 1 small diagonal with mild (25-49) ostial stenosis and mild (25-49) plaque in the mid vessel.  Left circumflex artery: The LCX is non-dominant and has minimal (0-24) plaque in the proximal vessel and mild (25-49) plaque in the mid vessel. The LCX gives off 2 large, patent obtuse marginal branches.   Right coronary artery: The RCA is dominant with normal take off from the right coronary cusp. There is mild (25-49) plaque in the proximal to mid vessel. The RCA terminates as a PDA and right posterolateral branch without evidence of plaque or stenosis.   Right Atrium: Right atrial size is within normal limits.   Right Ventricle: The right ventricular cavity is within normal limits.   Left Atrium: Left atrial size is enlarged; cannot R/O thrombus  at left atrial appendage tip.   Left Ventricle: The ventricular cavity size is within normal limits.   Pulmonary arteries: Normal in size.   Pulmonary veins: Normal pulmonary venous drainage.   Pericardium: Normal thickness without significant effusion or calcium  present.   Cardiac valves: The aortic valve is trileaflet without significant calcification. The mitral valve is normal; significant mitral annular calcification.   Aorta: Normal caliber with aortic atherosclerosis.   Extra-cardiac findings: See attached radiology report for non-cardiac structures.   IMPRESSION: 1. Coronary calcium  score of 661. This was 27 percentile for age-, sex, and race-matched controls.   2. Total plaque volume 577 mm3 which is 59 percentile for age- and sex-matched controls (calcified plaque 128 mm3; non-calcified plaque 449 mm3). TPV is (severe).   3. Normal coronary origin with right dominance.   4. Mild coronary artery disease in the LAD, diagonal, Lcx and RCA as outlined.   5. Aortic atherosclerosis.   6. LAE; cannot R/O left atrial appendage thrombus.   RECOMMENDATIONS: CAD-RADS 2: Mild non-obstructive CAD (25-49%). Consider non-atherosclerotic causes of chest pain. Consider preventive therapy and risk factor modification.      Physical Exam VS:  BP (!) 147/74   Pulse 85   Ht 5' 3 (1.6 m)   Wt 169 lb (76.7 kg)   SpO2 93%   BMI 29.94 kg/m        Wt Readings from Last 3 Encounters:  06/17/24 169 lb (76.7 kg)  06/15/24 168 lb (76.2 kg)  05/26/24 157 lb 6.4 oz (71.4 kg)    GEN: Well nourished, well developed in no acute distress NECK: No JVD; No carotid bruits CARDIAC: IRIR, no murmurs, rubs, gallops RESPIRATORY:  Clear to auscultation without rales, wheezing or rhonchi  ABDOMEN: Soft, non-tender, non-distended EXTREMITIES:  No edema; No deformity   ASSESSMENT AND PLAN  Chronic diastolic heart failure with volume overload and peripheral edema Significant volume  overload with 15-pound fluid weight gain causing increased dyspnea, fatigue, and edema. Oral diuretics insufficient; IV diuretics recommended for rapid fluid removal. - Admit for IV diuretics. - Monitor fluid status, electrolytes, kidney function. - Consider nephrology consultation. -start cartia XT 120mg    Paroxysmal atrial fibrillation with rapid ventricular response Poor rate control with significant heart rate variability and palpitations. Metoprolol  adjustment needed post fluid status optimization to prevent hypotension. - Continuous heart rate and rhythm monitoring. -continue Eliquis   Chronic kidney disease Fluctuating creatinine levels complicate fluid management. Diuretic therapy requires careful balance to prevent kidney function deterioration. - Monitor kidney function. - Consider nephrology consultation.  Essential hypertension Blood pressure management complicated by diuretic and rate control adjustments. Requires close monitoring with fluid status changes. - Monitor blood pressure. - Adjust antihypertensive therapy as needed.  CAD with high calcium  score - Will continue current medication regimen with Lipitor 10 mg daily, lopressor  50mg   BID -no anginal symptoms.      Dispo: She can follow-up after hospitalization   Signed, Orren LOISE Fabry, PA-C

## 2024-06-17 NOTE — H&P (Signed)
 Patient came into the office today to be seen and admission was recommended.  Office note from today will serve as H&P.              History of Present Illness Kelli [name] is a 83 y.o. female who with a past medical history of hypertension, paroxysmal atrial fibrillation with RVR who presents today for follow-up appointment.   Was admitted in 2014 and had demand ischemia.  Myoview study was normal.  Echo from [place] 2014 with EF 55 to 60%, grade 1 DD.  Has been on chronic therapy with metoprolol  and Eliquis .   She was seen Summer of 2025 by Jon Hails, PA-C with symptoms of DOE.  Echo showed grade 2 diastolic dysfunction and normal EF.  Moderate MR and mild to moderate TR.  BNP 151.  Coronary CTA showed a high calcium  score of 631 but mild nonobstructive CAD.  There was a questionable left atrial appendage thrombus.  Patient already on Eliquis .  Pulmonary nodule noted with recommendations for follow-up CT at 6 to 12 months.  Mild thickening of esophagus.   She did have repeat CT showing no change in pulmonary nodule, benign.   She did have 1 episode of atrial fibrillation the night prior to her last office visit in July where her heart rate was 122 bpm.  Lasted about an hour and then she converted.  Does note some shortness of breath when walking uphill.  Stated that it was better if she holds her thigh.  Like to have more energy.   No yesterday, she was called regarding her lab work.  Unfortunately she reported shortness of breath and 10 pound weight gain since seeing us  on 9/11.  Stated that mainly her ankles were swollen and puffy.  She just wants to sleep and has no energy.  Also reports tightness under her breast but is not painful.  She was restarted on HCTZ.  She she is set up to see me today to evaluate volume status.   Today, she presents with paroxysmal atrial fibrillation and fluid overload who presents with worsening shortness of breath and fatigue. She was referred by Dr. Holmes  for evaluation of her worsening symptoms.   Over the past week, she experiences worsening shortness of breath and fatigue, particularly when walking. She describes head pressure and a tightening sensation, which she refers to as 'tiredness' and 'heaviness'.   She has atrial fibrillation with heart rate fluctuations between 66 and 134 bpm and episodes of tachycardia noted during an echocardiogram. She experiences 'flutters' even at rest, and her heart rate is irregular. A heart monitor was recently mailed in, but results are pending.   She has fluid overload, with a weight gain of approximately 15 to 20 pounds. Initially, she lost 15 pounds but has since regained the weight. Her ProBNP levels are elevated, and she is on diuretics. Hydrochlorothiazide  was discontinued due to high potassium levels, and she is now prescribed oral Lasix.   Her kidney function has been fluctuating, with creatinine levels ranging from 1.2 to 1.7 over the past months. Her current creatinine is 1.6. Her kidney function is closely monitored due to her fluid status and diuretic use.   Discussed the use of AI scribe software for clinical note transcription with the patient, who gave verbal consent to proceed.     ROS: Pertinent ROS in HPI   Studies Reviewed       Echo 03/18/23: IMPRESSIONS     1. Left ventricular ejection  fraction, by estimation, is 60 to 65%. The  left ventricle has normal function. The left ventricle has no regional  wall motion abnormalities. There is mild left ventricular hypertrophy.  Left ventricular diastolic parameters  are consistent with Grade II diastolic dysfunction (pseudonormalization).   2. Right ventricular systolic function is normal. The right ventricular  size is normal. There is normal pulmonary artery systolic pressure.   3. Left atrial size was moderately dilated.   4. The mitral valve is degenerative. Mild to moderate mitral valve  regurgitation.   5. Tricuspid valve  regurgitation is mild to moderate.   6. The aortic valve is grossly normal. Aortic valve regurgitation is not  visualized.   7. The inferior vena cava is normal in size with greater than 50%  respiratory variability, suggesting right atrial pressure of 3 mmHg.    OVER-READ INTERPRETATION  PET-CT CHEST   The following report is an over-read performed by radiologist Dr. Camellia Lang [facility] on 04/25/2023. This over-read does not include interpretation of cardiac or coronary anatomy or pathology. The cardiac CT interpretation by the cardiologist is to be attached.   COMPARISON:  None.   FINDINGS: Mild circumferential wall thickening noted distal esophagus with tiny hiatal hernia evident.   6 mm posterior right middle lobe pulmonary nodule identified on image 18/11.   4 mm low-density lesion in the dome of the liver is too small to characterize but is statistically most likely benign. No followup imaging is recommended.   No suspicious lytic or sclerotic osseous abnormality.   IMPRESSION: 1. 6 mm posterior right middle lobe pulmonary nodule. Non-contrast chest CT at 6-12 months is recommended. If the nodule is stable at time of repeat CT, then future CT at 18-24 months (from today's scan) is considered optional for low-risk patients, but is recommended for high-risk patients. This recommendation follows the consensus statement: Guidelines for Management of Incidental Pulmonary Nodules Detected on CT Images: From the Fleischner Society 2017; Radiology 2017; 284:228-243. 2. Mild circumferential wall thickening distal esophagus with tiny hiatal hernia. Correlation for symptoms of esophagitis recommended.   These results will be called to the ordering clinician or representative by the Radiologist Assistant, and communication documented in the PACS or [employer].     Electronically Signed   By: Camellia Candle M.D.   On: 04/25/2023 07:44     EXAM: Cardiac/Coronary CTA   TECHNIQUE: A non-contrast, gated CT scan was obtained with axial slices of 3 mm through the heart for calcium  scoring. Calcium  scoring was performed using the Agatston method. A 120 kV prospective, gated, contrast cardiac scan was obtained. Gantry rotation speed was 250 msecs and collimation was 0.6 mm. Two sublingual nitroglycerin  tablets (0.8 mg) were given. The 3D data set was reconstructed in 5% intervals of the 35-75% of the R-R cycle. Diastolic phases were analyzed on a dedicated workstation using MPR, MIP, and VRT modes. The patient received 95 cc of contrast.   FINDINGS: Image quality: Excellent.   Noise artifact is: Limited.   Coronary Arteries:  Normal coronary origin.  Right dominance.   Left main: The left main is a large caliber vessel with a normal take off from the left coronary cusp that bifurcates to form a left anterior descending artery and a left circumflex artery. There is no plaque or stenosis.   Left anterior descending artery: The LAD has minimal (0-24) calcified plaque in the proximal and mid vessel. The LAD gives off 1 small diagonal with mild (25-49) ostial  stenosis and mild (25-49) plaque in the mid vessel.   Left circumflex artery: The LCX is non-dominant and has minimal (0-24) plaque in the proximal vessel and mild (25-49) plaque in the mid vessel. The LCX gives off 2 large, patent obtuse marginal branches.   Right coronary artery: The RCA is dominant with normal take off from the right coronary cusp. There is mild (25-49) plaque in the proximal to mid vessel. The RCA terminates as a PDA and right posterolateral branch without evidence of plaque or stenosis.   Right Atrium: Right atrial size is within normal limits.   Right Ventricle: The right ventricular cavity is within normal limits.   Left Atrium: Left atrial size is enlarged; cannot R/O thrombus at left atrial appendage tip.   Left Ventricle: The  ventricular cavity size is within normal limits.   Pulmonary arteries: Normal in size.   Pulmonary veins: Normal pulmonary venous drainage.   Pericardium: Normal thickness without significant effusion or calcium  present.   Cardiac valves: The aortic valve is trileaflet without significant calcification. The mitral valve is normal; significant mitral annular calcification.   Aorta: Normal caliber with aortic atherosclerosis.   Extra-cardiac findings: See attached radiology report for non-cardiac structures.   IMPRESSION: 1. Coronary calcium  score of 661. This was 27 percentile for age-, sex, and race-matched controls.   2. Total plaque volume 577 mm3 which is 59 percentile for age- and sex-matched controls (calcified plaque 128 mm3; non-calcified plaque 449 mm3). TPV is (severe).   3. Normal coronary origin with right dominance.   4. Mild coronary artery disease in the LAD, diagonal, Lcx and RCA as outlined.   5. Aortic atherosclerosis.   6. LAE; cannot R/O left atrial appendage thrombus.   RECOMMENDATIONS: CAD-RADS 2: Mild non-obstructive CAD (25-49%). Consider non-atherosclerotic causes of chest pain. Consider preventive therapy and risk factor modification.       Physical Exam VS:  BP (!) 147/74   Pulse 85   Ht 5' 3 (1.6 m)   Wt 169 lb (76.7 kg)   SpO2 93%   BMI 29.94 kg/m            Wt Readings from Last 3 Encounters:  06/17/24 169 lb (76.7 kg)  06/15/24 168 lb (76.2 kg)  05/26/24 157 lb 6.4 oz (71.4 kg)    GEN: Well nourished, well developed in no acute distress NECK: No JVD; No carotid bruits CARDIAC: IRIR, no murmurs, rubs, gallops RESPIRATORY:  Clear to auscultation without rales, wheezing or rhonchi  ABDOMEN: Soft, non-tender, non-distended EXTREMITIES:  No edema; No deformity    ASSESSMENT AND PLAN   Chronic diastolic heart failure with volume overload and peripheral edema Significant volume overload with 15-pound fluid weight gain causing  increased dyspnea, fatigue, and edema. Oral diuretics insufficient; IV diuretics recommended for rapid fluid removal. - Admit for IV diuretics. - Monitor fluid status, electrolytes, kidney function. - Consider nephrology consultation. -start cartia XT 120mg     Paroxysmal atrial fibrillation with rapid ventricular response Poor rate control with significant heart rate variability and palpitations. Metoprolol  adjustment needed post fluid status optimization to prevent hypotension. - Continuous heart rate and rhythm monitoring. -continue Eliquis    Chronic kidney disease Fluctuating creatinine levels complicate fluid management. Diuretic therapy requires careful balance to prevent kidney function deterioration. - Monitor kidney function. - Consider nephrology consultation.   Essential hypertension Blood pressure management complicated by diuretic and rate control adjustments. Requires close monitoring with fluid status changes. - Monitor blood pressure. - Adjust antihypertensive therapy  as needed.   CAD with high calcium  score - Will continue current medication regimen with Lipitor 10 mg daily, lopressor  50mg  BID -no anginal symptoms.       Dispo: She can follow-up after hospitalization    Signed, Orren LOISE Fabry, PA-C

## 2024-06-17 NOTE — Patient Instructions (Signed)
 YOU HAVE BEEN RECOMMENDED TO GO TO [facility] FOR MORE ANALYSIS FOR FLUID OVERLOAD . SOME ONE WILL  CONTACT YOU BACK  FROM  [place]  ONCE YOUR BED IS AVAILABLE

## 2024-06-17 NOTE — Progress Notes (Signed)
 Admission orders placed.   Orren LOISE Fabry, PA-C

## 2024-06-17 NOTE — Progress Notes (Signed)
 Called pt she is advised of the recommendation that was given she stated she seen a different in using the bathroom, she stated that she has an appointment with her Cardiologist today

## 2024-06-17 NOTE — Progress Notes (Signed)
 Orren,   I saw her on DOD day.   She is seeing you today could you evaluate her volume status and medications.   Sending you labs for review.   Darrol Brandenburg [place], DO, FACC

## 2024-06-18 DIAGNOSIS — E782 Mixed hyperlipidemia: Secondary | ICD-10-CM | POA: Diagnosis not present

## 2024-06-18 DIAGNOSIS — I2510 Atherosclerotic heart disease of native coronary artery without angina pectoris: Secondary | ICD-10-CM

## 2024-06-18 DIAGNOSIS — I10 Essential (primary) hypertension: Secondary | ICD-10-CM

## 2024-06-18 DIAGNOSIS — I4819 Other persistent atrial fibrillation: Secondary | ICD-10-CM | POA: Diagnosis not present

## 2024-06-18 DIAGNOSIS — I5033 Acute on chronic diastolic (congestive) heart failure: Secondary | ICD-10-CM

## 2024-06-18 LAB — BASIC METABOLIC PANEL WITH GFR
Anion gap: 13 (ref 5–15)
BUN: 36 mg/dL — ABNORMAL HIGH (ref 8–23)
CO2: 22 mmol/L (ref 22–32)
Calcium: 8.5 mg/dL — ABNORMAL LOW (ref 8.9–10.3)
Chloride: 103 mmol/L (ref 98–111)
Creatinine, Ser: 1.59 mg/dL — ABNORMAL HIGH (ref 0.44–1.00)
GFR, Estimated: 32 mL/min — ABNORMAL LOW (ref >60)
Glucose, Bld: 89 mg/dL (ref 70–99)
Potassium: 3.6 mmol/L (ref 3.5–5.1)
Sodium: 138 mmol/L (ref 135–145)

## 2024-06-18 MED ORDER — APIXABAN 2.5 MG PO TABS
2.5000 mg | ORAL_TABLET | Freq: Two times a day (BID) | ORAL | Status: DC
Start: 2024-06-18 — End: 2024-06-20
  Administered 2024-06-18 – 2024-06-20 (×4): 2.5 mg via ORAL
  Filled 2024-06-18 (×4): qty 1

## 2024-06-18 NOTE — Plan of Care (Signed)

## 2024-06-18 NOTE — Progress Notes (Signed)
 Progress Note  Patient Name: Kelli [name] Date of Encounter: 06/18/2024  Primary Cardiologist: Peter Swaziland, MD   Subjective   Doing well but not at baseline. Still with some positional SOB  Inpatient Medications    Scheduled Meds:  apixaban   5 mg Oral BID   atorvastatin   10 mg Oral Daily   empagliflozin   10 mg Oral QAC breakfast   famotidine   40 mg Oral Daily   furosemide  40 mg Intravenous BID   metoprolol  tartrate  75 mg Oral BID   sodium chloride  flush  3 mL Intravenous Q12H   Continuous Infusions:  sodium chloride      PRN Meds: sodium chloride , acetaminophen , docusate sodium , ondansetron  (ZOFRAN ) IV, sodium chloride  flush   Vital Signs    Vitals:   06/17/24 2003 06/18/24 0009 06/18/24 0440 06/18/24 0742  BP: 122/67 111/70 (!) 137/91 (!) 121/100  Pulse: 85 96 97 (!) 114  Resp: 18 18 19 18   Temp: 97.8 F (36.6 C) 98.2 F (36.8 C) 98.3 F (36.8 C) 98.2 F (36.8 C)  TempSrc: Oral Oral Oral Oral  SpO2: 95% 92% 95% 96%  Weight:   73.2 kg   Height:        Intake/Output Summary (Last 24 hours) at 06/18/2024 1009 Last data filed at 06/18/2024 0900 Gross per 24 hour  Intake 720 ml  Output 2200 ml  Net -1480 ml   Filed Weights   06/17/24 1416 06/18/24 0440  Weight: 77 kg 73.2 kg    Telemetry    Afib with rvr - Personally Reviewed  ECG    Not done  Physical Exam   GEN: No acute distress.   Neck: No JVD Cardiac: irregular rhythm, fast rate, no murmurs, rubs, or gallops.  Respiratory: bibasilar rales GI: Soft, nontender, non-distended  MS: No edema; No deformity. Neuro:  Nonfocal  Psych: Normal affect   Labs    Chemistry Recent Labs  Lab 06/16/24 1021 06/17/24 1737 06/18/24 0243  NA 136 135 138  K 4.6 3.7 3.6  CL 100 104 103  CO2 19* 21* 22  GLUCOSE 87 117* 89  BUN 22 34* 36*  CREATININE 1.56* 1.52* 1.59*  CALCIUM  8.8 8.6* 8.5*  PROT  --  6.4*  --   ALBUMIN  --  3.2*  --   AST  --  65*  --   ALT  --  64*  --   ALKPHOS  --  73   --   BILITOT  --  0.9  --   GFRNONAA  --  34* 32*  ANIONGAP  --  10 13     Hematology Recent Labs  Lab 06/17/24 1737  WBC 8.5  RBC 4.14  HGB 12.9  HCT 38.8  MCV 93.7  MCH 31.2  MCHC 33.2  RDW 14.5  PLT 221    Cardiac EnzymesNo results for input(s): TROPONINI in the last 168 hours. No results for input(s): TROPIPOC in the last 168 hours.   BNP Recent Labs  Lab 06/16/24 1021 06/17/24 1737  BNP  --  856.2*  PROBNP 5,752*  --      DDimer No results for input(s): DDIMER in the last 168 hours.   Radiology    DG Chest 2 View Result Date: 06/17/2024 EXAM: 2 VIEW(S) XRAY OF THE CHEST 06/17/2024 04:34:00 PM COMPARISON: 01/03/2014. CLINICAL HISTORY: CHF (congestive heart failure) (HCC) [id number]. Ordered for CHF. Per RN note today: Patient reports ongoing shortness of breath and 15 lbs weight gain. BP  128/76. HR in the 90s-100s in atrial fibrillation. She tells me she has been having palpitations, fluctuating HR for about a month. Is adamant that she has been compliant with her anticoagulation. Could consider DCCV when closer to euvolemic. FINDINGS: LUNGS AND PLEURA: Central pulmonary vascular congestion without overt pulmonary edema. Mild diffuse interstitial prominence. Bibasilar opacities. Small left and trace right pleural effusions. No pneumothorax. HEART AND MEDIASTINUM: Cardiomegaly. Aortic atherosclerosis. BONES AND SOFT TISSUES: No acute osseous abnormality. IMPRESSION: 1. Central pulmonary vascular congestion without overt pulmonary edema. 2. Mild diffuse interstitial prominence and bibasilar opacities. 3. Small left and trace right pleural effusions. Electronically signed by: Donnice Mania MD 06/17/2024 06:44 PM EDT RP Workstation: [id number]    Cardiac Studies    Echo 06/07/24: IMPRESSIONS    1. Left ventricular ejection fraction, by estimation, is 50 to 55%. The  left ventricle has low normal function. The left ventricle has no regional  wall motion abnormalities.  There is mild left ventricular hypertrophy.  Left ventricular diastolic parameters are indeterminate.   2. Right ventricular systolic function is normal. The right ventricular  size is normal. There is mildly elevated pulmonary artery systolic  pressure. The estimated right ventricular systolic pressure is 37.3 mmHg.   3. Left atrial size was mildly dilated.   4. Right atrial size was mildly dilated.   5. The mitral valve is degenerative. Mild to moderate mitral valve  regurgitation. No evidence of mitral stenosis. Severe mitral annular  calcification.   6. Tricuspid valve regurgitation is mild to moderate.   7. The aortic valve is tricuspid. Aortic valve regurgitation is not  visualized. No aortic stenosis is present.   8. The inferior vena cava is dilated in size with <50% respiratory  variability, suggesting right atrial pressure of 15 mmHg.     CCTA 04/20/23: IMPRESSION: 1. Coronary calcium  score of 661. This was 14 percentile for age-, sex, and race-matched controls.   2. Total plaque volume 577 mm3 which is 59 percentile for age- and sex-matched controls (calcified plaque 128 mm3; non-calcified plaque 449 mm3). TPV is (severe).   3. Normal coronary origin with right dominance.   4. Mild coronary artery disease in the LAD, diagonal, Lcx and RCA as outlined.   5. Aortic atherosclerosis.   6. LAE; cannot R/O left atrial appendage thrombus.   RECOMMENDATIONS: CAD-RADS 2: Mild non-obstructive CAD (25-49%). Consider non-atherosclerotic causes of chest pain. Consider preventive therapy and risk factor modification.    Assessment & Plan   Principal Problem:   CHF (congestive heart failure) (HCC)   Acute on Chronic diastolic heart failure  Significant volume overload on admission with 15-pound fluid weight gain causing increased dyspnea, fatigue, and edema. Admitted from the office for diuresis. Weight down 4 lb since admit, baseline weight is 157 lb. - 2.2L overnight -  Continue lasix 40 mg IV BID, Hold Jardiance  in anticipation of DCCV this hospitalization - Monitor fluid status, electrolytes, kidney function.   Paroxysmal atrial fibrillation with rapid ventricular response - ECG - On Lopressor  75 mg BID and Eliquis  5 mg BID - DCCV this hospitalization   Chronic kidney disease 3b - Monitor kidney function. - Consider nephrology consultation.   Essential hypertension Blood pressure management complicated by diuretic and rate control adjustments.  - Adjust antihypertensive therapy as needed.   CAD with high calcium  score - Will continue current medication regimen with Lipitor 10 mg daily, lopressor  50mg  BID -no anginal symptoms.       Dispo: She can follow-up after  hospitalization. DC planning for Monday or Tuesday after DCCV  For questions or updates, please contact [facility] Please consult [email or web address] for contact info under        Signed, Emeline FORBES Calender, MD  06/18/2024, 10:09 AM

## 2024-06-18 NOTE — Progress Notes (Signed)
 PHARMACY - ANTICOAGULATION CONSULT NOTE  Discussed patient's apixaban  dose with on-call provider, with Serum Cr >1.5 and age >44 will reduce dose to 2.5 mg BID, consider increasing back to 5 mg BID if improving renal function or on discharge.  Kelli [name], PharmD Clinical Pharmacist 06/18/2024  3:58 PM **Pharmacist phone directory can now be found on [email or web address] ([email or web address]).  Listed under [facility].

## 2024-06-19 ENCOUNTER — Other Ambulatory Visit: Payer: Self-pay

## 2024-06-19 ENCOUNTER — Encounter (HOSPITAL_COMMUNITY): Payer: Self-pay | Admitting: Cardiology

## 2024-06-19 DIAGNOSIS — I5033 Acute on chronic diastolic (congestive) heart failure: Secondary | ICD-10-CM | POA: Diagnosis not present

## 2024-06-19 LAB — BASIC METABOLIC PANEL WITH GFR
Anion gap: 11 (ref 5–15)
BUN: 39 mg/dL — ABNORMAL HIGH (ref 8–23)
CO2: 31 mmol/L (ref 22–32)
Calcium: 8.8 mg/dL — ABNORMAL LOW (ref 8.9–10.3)
Chloride: 95 mmol/L — ABNORMAL LOW (ref 98–111)
Creatinine, Ser: 1.88 mg/dL — ABNORMAL HIGH (ref 0.44–1.00)
GFR, Estimated: 26 mL/min — ABNORMAL LOW (ref >60)
Glucose, Bld: 95 mg/dL (ref 70–99)
Potassium: 3.8 mmol/L (ref 3.5–5.1)
Sodium: 137 mmol/L (ref 135–145)

## 2024-06-19 MED ORDER — FUROSEMIDE 40 MG PO TABS
40.0000 mg | ORAL_TABLET | Freq: Every day | ORAL | Status: DC
  Administered 2024-06-19: 40 mg via ORAL
  Filled 2024-06-19: qty 1

## 2024-06-19 MED ORDER — FAMOTIDINE 20 MG PO TABS
20.0000 mg | ORAL_TABLET | Freq: Every day | ORAL | Status: DC
  Administered 2024-06-20: 20 mg via ORAL
  Filled 2024-06-19: qty 1

## 2024-06-19 NOTE — Progress Notes (Addendum)
   06/19/24 1103  Mobility  Activity Ambulated independently  Level of Assistance Contact guard assist, steadying assist  Assistive Device None  Distance Ambulated (ft) 400 ft  Activity Response Tolerated fair  Mobility Referral Yes  Mobility visit 1 Mobility  Mobility Specialist Start Time (ACUTE ONLY) 1103  Mobility Specialist Stop Time (ACUTE ONLY) 1112  Mobility Specialist Time Calculation (min) (ACUTE ONLY) 9 min   Mobility Specialist: Progress Note  During Mobility: HR  133  Pt agreeable to mobility session - received ambulating in hallway. C/o abd tightness. Returned to for legged chair with all needs met - call bell within reach.   Additional comments: Daughter present.   Virgle Boards, BS Mobility Specialist Please contact via SecureChat or  Rehab office at [phone].

## 2024-06-19 NOTE — Progress Notes (Signed)
 Progress Note  Patient Name: Kelli [name] Date of Encounter: 06/19/2024  Primary Cardiologist: Peter Swaziland, MD   Subjective   Doing well overnight and feeling much better. Ambulating in the hallway.  Inpatient Medications    Scheduled Meds:  apixaban   2.5 mg Oral BID   atorvastatin   10 mg Oral Daily   famotidine   40 mg Oral Daily   furosemide  40 mg Intravenous BID   metoprolol  tartrate  75 mg Oral BID   sodium chloride  flush  3 mL Intravenous Q12H   Continuous Infusions:   PRN Meds: acetaminophen , docusate sodium , ondansetron  (ZOFRAN ) IV, sodium chloride  flush   Vital Signs    Vitals:   06/18/24 2352 06/19/24 0346 06/19/24 0350 06/19/24 0716  BP: (!) 97/48  (!) 95/53 125/68  Pulse: 87  94 95  Resp: 19  18 20   Temp: 98.9 F (37.2 C)  98.3 F (36.8 C) 97.7 F (36.5 C)  TempSrc: Oral  Oral Oral  SpO2: 96%  91% 97%  Weight:  73.8 kg    Height:        Intake/Output Summary (Last 24 hours) at 06/19/2024 1028 Last data filed at 06/19/2024 0948 Gross per 24 hour  Intake 120 ml  Output 4550 ml  Net -4430 ml   Filed Weights   06/17/24 1416 06/18/24 0440 06/19/24 0346  Weight: 77 kg 73.2 kg 73.8 kg    Telemetry    Afib with rvr - Personally Reviewed  ECG    Not done  Physical Exam   Physical Exam Vitals and nursing note reviewed.  Constitutional:      Appearance: Normal appearance.  HENT:     Head: Normocephalic and atraumatic.  Eyes:     Conjunctiva/sclera: Conjunctivae normal.  Neck:     Vascular: No JVD.  Cardiovascular:     Rate and Rhythm: Normal rate and regular rhythm.  Pulmonary:     Effort: Pulmonary effort is normal.     Breath sounds: Normal breath sounds.  Musculoskeletal:        General: No swelling or tenderness.  Skin:    Coloration: Skin is not jaundiced or pale.  Neurological:     Mental Status: She is alert.      Labs    Chemistry Recent Labs  Lab 06/16/24 1021 06/17/24 1737 06/18/24 0243  NA 136 135 138  K  4.6 3.7 3.6  CL 100 104 103  CO2 19* 21* 22  GLUCOSE 87 117* 89  BUN 22 34* 36*  CREATININE 1.56* 1.52* 1.59*  CALCIUM  8.8 8.6* 8.5*  PROT  --  6.4*  --   ALBUMIN  --  3.2*  --   AST  --  65*  --   ALT  --  64*  --   ALKPHOS  --  73  --   BILITOT  --  0.9  --   GFRNONAA  --  34* 32*  ANIONGAP  --  10 13     Hematology Recent Labs  Lab 06/17/24 1737  WBC 8.5  RBC 4.14  HGB 12.9  HCT 38.8  MCV 93.7  MCH 31.2  MCHC 33.2  RDW 14.5  PLT 221    Cardiac EnzymesNo results for input(s): TROPONINI in the last 168 hours. No results for input(s): TROPIPOC in the last 168 hours.   BNP Recent Labs  Lab 06/16/24 1021 06/17/24 1737  BNP  --  856.2*  PROBNP 5,752*  --  DDimer No results for input(s): DDIMER in the last 168 hours.   Radiology    DG Chest 2 View Result Date: 06/17/2024 EXAM: 2 VIEW(S) XRAY OF THE CHEST 06/17/2024 04:34:00 PM COMPARISON: 01/03/2014. CLINICAL HISTORY: CHF (congestive heart failure) (HCC) [id number]. Ordered for CHF. Per RN note today: Patient reports ongoing shortness of breath and 15 lbs weight gain. BP 128/76. HR in the 90s-100s in atrial fibrillation. She tells me she has been having palpitations, fluctuating HR for about a month. Is adamant that she has been compliant with her anticoagulation. Could consider DCCV when closer to euvolemic. FINDINGS: LUNGS AND PLEURA: Central pulmonary vascular congestion without overt pulmonary edema. Mild diffuse interstitial prominence. Bibasilar opacities. Small left and trace right pleural effusions. No pneumothorax. HEART AND MEDIASTINUM: Cardiomegaly. Aortic atherosclerosis. BONES AND SOFT TISSUES: No acute osseous abnormality. IMPRESSION: 1. Central pulmonary vascular congestion without overt pulmonary edema. 2. Mild diffuse interstitial prominence and bibasilar opacities. 3. Small left and trace right pleural effusions. Electronically signed by: Donnice Mania MD 06/17/2024 06:44 PM EDT RP Workstation:  [id number]    Cardiac Studies    Echo 06/07/24: IMPRESSIONS    1. Left ventricular ejection fraction, by estimation, is 50 to 55%. The  left ventricle has low normal function. The left ventricle has no regional  wall motion abnormalities. There is mild left ventricular hypertrophy.  Left ventricular diastolic parameters are indeterminate.   2. Right ventricular systolic function is normal. The right ventricular  size is normal. There is mildly elevated pulmonary artery systolic  pressure. The estimated right ventricular systolic pressure is 37.3 mmHg.   3. Left atrial size was mildly dilated.   4. Right atrial size was mildly dilated.   5. The mitral valve is degenerative. Mild to moderate mitral valve  regurgitation. No evidence of mitral stenosis. Severe mitral annular  calcification.   6. Tricuspid valve regurgitation is mild to moderate.   7. The aortic valve is tricuspid. Aortic valve regurgitation is not  visualized. No aortic stenosis is present.   8. The inferior vena cava is dilated in size with <50% respiratory  variability, suggesting right atrial pressure of 15 mmHg.     CCTA 04/20/23: IMPRESSION: 1. Coronary calcium  score of 661. This was 86 percentile for age-, sex, and race-matched controls.   2. Total plaque volume 577 mm3 which is 59 percentile for age- and sex-matched controls (calcified plaque 128 mm3; non-calcified plaque 449 mm3). TPV is (severe).   3. Normal coronary origin with right dominance.   4. Mild coronary artery disease in the LAD, diagonal, Lcx and RCA as outlined.   5. Aortic atherosclerosis.   6. LAE; cannot R/O left atrial appendage thrombus.   RECOMMENDATIONS: CAD-RADS 2: Mild non-obstructive CAD (25-49%). Consider non-atherosclerotic causes of chest pain. Consider preventive therapy and risk factor modification.    Assessment & Plan   Principal Problem:   CHF (congestive heart failure) (HCC)   Acute on Chronic diastolic  heart failure  Significant volume overload on admission with 15-pound fluid weight gain causing increased dyspnea, fatigue, and edema. Admitted from the office for diuresis. Weight stable but down 7 lb since admit, baseline weight is 157 lb.(Currently 163 lb) - 4.8L overnight - Change IV to PO lasix 40 mg daily - Hold Jardiance  in anticipation of DCCV this hospitalization - Monitor fluid status, electrolytes, kidney function.   Paroxysmal atrial fibrillation with rapid ventricular response - rates relatively controlled On Lopressor  75 mg BID  - stroke prophylaxis with renally dosed Eliquis   2.5 mg BID (reduced this hospitalization due to Cr > 1.5 and age>80 but may need to be increased prior to discharge) - DCCV this hospitalization (hopefully tomorrow, will make NPO after midnight in anticiaption)   Chronic kidney disease 3b Creatinine 1.59 - Monitor kidney function.   Essential hypertension Blood pressure management complicated by diuretic and rate control adjustments.  - Adjust antihypertensive therapy as needed.   CAD with high calcium  score - Will continue current medication regimen with Lipitor 10 mg daily, lopressor  50mg  BID -no anginal symptoms.       Dispo: She can follow-up after hospitalization. DC planning for Monday or Tuesday after DCCV  For questions or updates, please contact [facility] Please consult [email or web address] for contact info under        Signed, Emeline FORBES Calender, MD  06/19/2024, 10:28 AM

## 2024-06-19 NOTE — Plan of Care (Signed)

## 2024-06-20 ENCOUNTER — Inpatient Hospital Stay (HOSPITAL_COMMUNITY)

## 2024-06-20 ENCOUNTER — Encounter (HOSPITAL_COMMUNITY): Admission: AD | Disposition: A | Payer: Self-pay | Source: Ambulatory Visit | Attending: Cardiology

## 2024-06-20 DIAGNOSIS — I4891 Unspecified atrial fibrillation: Secondary | ICD-10-CM

## 2024-06-20 DIAGNOSIS — N183 Chronic kidney disease, stage 3 unspecified: Secondary | ICD-10-CM

## 2024-06-20 DIAGNOSIS — I509 Heart failure, unspecified: Secondary | ICD-10-CM

## 2024-06-20 DIAGNOSIS — I130 Hypertensive heart and chronic kidney disease with heart failure and stage 1 through stage 4 chronic kidney disease, or unspecified chronic kidney disease: Secondary | ICD-10-CM

## 2024-06-20 DIAGNOSIS — I480 Paroxysmal atrial fibrillation: Secondary | ICD-10-CM

## 2024-06-20 HISTORY — PX: CARDIOVERSION: EP1203

## 2024-06-20 SURGERY — CARDIOVERSION (CATH LAB)
Anesthesia: General

## 2024-06-20 MED ORDER — LIDOCAINE 2% (20 MG/ML) 5 ML SYRINGE
INTRAMUSCULAR | Status: DC | PRN
  Administered 2024-06-20: 100 mg via INTRAVENOUS

## 2024-06-20 MED ORDER — PHENYLEPHRINE HCL (PRESSORS) 10 MG/ML IV SOLN
INTRAVENOUS | Status: DC | PRN
  Administered 2024-06-20 (×3): 80 ug via INTRAVENOUS

## 2024-06-20 MED ORDER — EZETIMIBE 10 MG PO TABS
10.0000 mg | ORAL_TABLET | Freq: Every day | ORAL | Status: DC
  Administered 2024-06-20: 10 mg via ORAL
  Filled 2024-06-20: qty 1

## 2024-06-20 MED ORDER — SODIUM CHLORIDE 0.9 % IV SOLN: INTRAVENOUS | Status: DC | PRN

## 2024-06-20 MED ORDER — APIXABAN 2.5 MG PO TABS: 2.5000 mg | ORAL_TABLET | Freq: Two times a day (BID) | ORAL | 11 refills | Status: AC

## 2024-06-20 MED ORDER — METOPROLOL TARTRATE 75 MG PO TABS: 75.0000 mg | ORAL_TABLET | Freq: Two times a day (BID) | ORAL | 11 refills | Status: AC

## 2024-06-20 MED ORDER — PROPOFOL 10 MG/ML IV BOLUS
INTRAVENOUS | Status: DC | PRN
  Administered 2024-06-20: 40 mg via INTRAVENOUS

## 2024-06-20 MED ORDER — SOD CITRATE-CITRIC ACID 500-334 MG/5ML PO SOLN
30.0000 mL | Freq: Once | ORAL | Status: AC
  Administered 2024-06-20: 30 mL via ORAL

## 2024-06-20 MED ORDER — EZETIMIBE 10 MG PO TABS: 10.0000 mg | ORAL_TABLET | Freq: Every day | ORAL | 3 refills | Status: DC

## 2024-06-20 SURGICAL SUPPLY — 1 items: PAD DEFIB RADIO PHYSIO CONN (PAD) ×1 IMPLANT

## 2024-06-20 NOTE — Anesthesia Postprocedure Evaluation (Signed)
 Anesthesia Post Note  Patient: Kelli [name]  Procedure(s) Performed: CARDIOVERSION     Patient location during evaluation: Other Anesthesia Type: General Level of consciousness: awake Pain management: pain level controlled Vital Signs Assessment: post-procedure vital signs reviewed and stable Respiratory status: spontaneous breathing and nonlabored ventilation Cardiovascular status: blood pressure returned to baseline Postop Assessment: no headache Anesthetic complications: no   No notable events documented.  Last Vitals:  Vitals:   06/20/24 1203 06/20/24 1304  BP: (!) 115/56 124/62  Pulse: 60 (!) 58  Resp: 18   Temp:    SpO2: 99% 100%    Last Pain:  Vitals:   06/20/24 1203  TempSrc:   PainSc: 0-No pain                 [name]

## 2024-06-20 NOTE — Discharge Summary (Addendum)
 Discharge Summary   Patient ID: Kelli [name] MRN: [id number]; DOB: 10/23/1940  Admit date: 06/17/2024 Discharge date: 06/20/2024  PCP:  Chandra Toribio POUR, MD   [facility] Providers Cardiologist:  Peter Swaziland, MD  Cardiology APP:  Madie Jon Garre, [place]   Discharge Diagnoses  Principal Problem:   CHF (congestive heart failure) [facility])   Diagnostic Studies/Procedures   Cardioversion  Procedure:   DCCV   Indication:  Symptomatic atrial fibrillation   Procedure Note:  The patient signed informed consent.  The patient has had therapeutic oral anticoagulation uninterrupted for greater than 3 weeks.  Anesthesia was administered per the anesthesia team.  Adequate airway was maintained throughout and vital followed per protocol.  The patient was cardioverted x 1 with 360J of biphasic synchronized energy.  The patient converted to NSR.  There were no apparent complications.  The patient had normal neuro status and respiratory status post procedure with vitals stable as recorded elsewhere.   _____________   History of Present Illness   Kelli [name] is a 84 y.o. female with a past medical history of hypertension, and paroxysmal atrial fibrillation.  The patient was initially diagnosed with atrial fibrillation back in 2014 and at that time was started on Eliquis . On 05/26/2024 the patient was seen in the office by Dr. Tolea.  EKG at that time showed sinus tachycardia and a 14-day ZIO monitor was ordered. Transthoracic echo on  06/07/24 showed a normal LVEF of 50-55%, No RWMA, mildly elevated pulmonary artery systolic pressure of 37.72mmHg, Mild to mod MR, Mild to mod TR, Severe MAC, trivial pericardial effustion, and IVC with <50% respiratory variation. At outpatient visit on 06/17/2024 the patient reported having shortness of breath and 10-15 lb weight gain.  At that visit she was found to be in atrial fibrillation.  Because of these concerns the patient was hospitalized.  Hospital Course    Consultants: none   Acute on chronic HFpEF Mild to moderate MR Mild to moderate TR Severe MAC BNP was elevated at 856.2.  Chest x-ray showed Central pulmonary vascular congestion without overt pulmonary edema. She was started on IV diuresis and I/O  were down about 8 L since admission. Was transitioned to oral Lasix. Developed slight AKI and suspect this is from over diuresis.  Suspect the heart failure was likely related to decreased cardiac output from the A-fib with RVR. Ordered BMET and CBC but these were not done. Stop Lasix. May consider adding as need lasix at outpatient follow up.  Recommend ordering CBC, BMET and Magnesium at outpatient follow up. Recommend daily weights and contacting if 5lb weight gain, lower extremity edema, and shortness of breath. GDMT Continue Lopressor  75mg  BID.  Plan to restart Jardiance  10mg  in 2 days after AKI resolves and after DCCV.     Paroxysmal A-Fib with RVR CHA2DS2-VASc Score = 6 [CHF History: 1, HTN History: 1, Diabetes History: 0, Stroke History: 0, Vascular Disease History: 1, Age Score: 2, Gender Score: 1].  Therefore, the patient's annual risk of stroke is 9.7 %.    Previously had atrial fibrillation 10 years ago. Reported a flutter sensation but denies any other symptoms with the atrial fibrillation. Reported that symptoms have improved with diuresis.  The patient denied missing any doses of Eliquis .  On 06/20/2024 the patient underwent a successful cardioversion.  The patient was seen by Dr. Delford and felt to be stable for discharge. Continue Eliquis  2.5mg  BID.  Prior to admission was on Eliquis  5 mg twice daily  this was decreased this hospitalization due to age and creatinine. Continue Lopressor  75mg  BID.       CAD with high calcium  score of 661 Hyperlipidemia LDL 86 on 12/2023. Due to high calcium  score consider LDL goal of < 55. Prior documentation reported intolerance to higher statin dose. Recommend ordering lipid panel and LPA at  outpatient follow-up. Continue Lipitor to 10mg  daily. Start Zetia 10mg  daily. Doubt will be able to achieve goal LDL with low dose statin and Zetia so may consider referral to lipid clinic for PCK9 inhibitor. No need of aspirin  given eliquis  use.     Stage 3B CKD AKI Creatinine 1.88 on 10/5. Up from 1.59 the prior day. Will hold Jardiance  and restart in 2 days.     HTN BP well managed and slightly soft at times. Management per heart failure and afib above. PTA on losartan  25mg  daily.  Will stop this and prioritize rate control.     Did the patient have an acute coronary syndrome (MI, NSTEMI, STEMI, etc) this admission?:  No                               Did the patient have a percutaneous coronary intervention (stent / angioplasty)?:  No.        The patient is scheduled for a TOC follow up appointment with Lucien Orren SAILOR, PA-C in 13 days.  A message has been sent to the [facility] and Scheduling Pool at the office where the patient should be seen for follow up.  _____________  Discharge Vitals Blood pressure (!) 115/56, pulse 60, temperature 98 F (36.7 C), temperature source Tympanic, resp. rate 18, height 5' 3 (1.6 m), weight 70 kg, SpO2 99%.  Filed Weights   06/19/24 0346 06/19/24 1000 06/20/24 0300  Weight: 73.8 kg (P) 70.1 kg 70 kg    Labs & Radiologic Studies  CBC Recent Labs    06/17/24 1737  WBC 8.5  HGB 12.9  HCT 38.8  MCV 93.7  PLT 221   Basic Metabolic Panel Recent Labs    89/95/74 0243 06/19/24 1133  NA 138 137  K 3.6 3.8  CL 103 95*  CO2 22 31  GLUCOSE 89 95  BUN 36* 39*  CREATININE 1.59* 1.88*  CALCIUM  8.5* 8.8*   Liver Function Tests Recent Labs    06/17/24 1737  AST 65*  ALT 64*  ALKPHOS 73  BILITOT 0.9  PROT 6.4*  ALBUMIN 3.2*   No results for input(s): LIPASE, AMYLASE in the last 72 hours. High Sensitivity Troponin:   No results for input(s): TROPONINIHS in the last 720 hours.  No results for input(s): TRNPT in the last  720 hours.  BNP Invalid input(s): POCBNP No results for input(s): PROBNP in the last 72 hours.  Recent Labs    06/17/24 1737  BNP 856.2*    D-Dimer No results for input(s): DDIMER in the last 72 hours. Hemoglobin A1C No results for input(s): HGBA1C in the last 72 hours. Fasting Lipid Panel No results for input(s): CHOL, HDL, LDLCALC, TRIG, CHOLHDL, LDLDIRECT in the last 72 hours. No results found for: LIPOA  Thyroid  Function Tests No results for input(s): TSH, T4TOTAL, T3FREE, THYROIDAB in the last 72 hours.  Invalid input(s): FREET3 _____________  EP STUDY Result Date: 06/20/2024 See surgical note for result.  DG Chest 2 View Result Date: 06/17/2024 EXAM: 2 VIEW(S) XRAY OF THE CHEST 06/17/2024 04:34:00 PM COMPARISON: 01/03/2014.  CLINICAL HISTORY: CHF (congestive heart failure) (HCC) [id number]. Ordered for CHF. Per RN note today: Patient reports ongoing shortness of breath and 15 lbs weight gain. BP 128/76. HR in the 90s-100s in atrial fibrillation. She tells me she has been having palpitations, fluctuating HR for about a month. Is adamant that she has been compliant with her anticoagulation. Could consider DCCV when closer to euvolemic. FINDINGS: LUNGS AND PLEURA: Central pulmonary vascular congestion without overt pulmonary edema. Mild diffuse interstitial prominence. Bibasilar opacities. Small left and trace right pleural effusions. No pneumothorax. HEART AND MEDIASTINUM: Cardiomegaly. Aortic atherosclerosis. BONES AND SOFT TISSUES: No acute osseous abnormality. IMPRESSION: 1. Central pulmonary vascular congestion without overt pulmonary edema. 2. Mild diffuse interstitial prominence and bibasilar opacities. 3. Small left and trace right pleural effusions. Electronically signed by: Donnice Mania MD 06/17/2024 06:44 PM EDT RP Workstation: [id number]   ECHOCARDIOGRAM COMPLETE Result Date: 06/07/2024    ECHOCARDIOGRAM REPORT   Patient Name:   Kelli [name] Date  of Exam: 06/07/2024 Medical Rec #:  [id number]     Height:       63.0 in Accession #:    [id number]    Weight:       157.4 lb Date of Birth:  08/09/1941     BSA:          1.746 m Patient Age:    82 years      BP:           120/78 mmHg Patient Gender: F             HR:           111 bpm. Exam Location:  [address] Procedure: 2D Echo, Cardiac Doppler and Color Doppler (Both Spectral and Color            Flow Doppler were utilized during procedure). Indications:    R94.31 Abnormal EKG  History:        Patient has prior history of Echocardiogram examinations, most                 recent 03/18/2023. Diastolic CHF, CAD and Previous Myocardial                 Infarction, Arrythmias:Atrial Fibrillation,                 Signs/Symptoms:Shortness of Breath; Risk Factors:Dyslipidemia                 and Hypertension. LVEF 65% mild to moderate TR and MR.  Sonographer:    Nolon Berg BA, RDCS Referring Phys: MADONNA LARGE IMPRESSIONS  1. Left ventricular ejection fraction, by estimation, is 50 to 55%. The left ventricle has low normal function. The left ventricle has no regional wall motion abnormalities. There is mild left ventricular hypertrophy. Left ventricular diastolic parameters are indeterminate.  2. Right ventricular systolic function is normal. The right ventricular size is normal. There is mildly elevated pulmonary artery systolic pressure. The estimated right ventricular systolic pressure is 37.3 mmHg.  3. Left atrial size was mildly dilated.  4. Right atrial size was mildly dilated.  5. The mitral valve is degenerative. Mild to moderate mitral valve regurgitation. No evidence of mitral stenosis. Severe mitral annular calcification.  6. Tricuspid valve regurgitation is mild to moderate.  7. The aortic valve is tricuspid. Aortic valve regurgitation is not visualized. No aortic stenosis is present.  8. The inferior vena cava is dilated in size with <50% respiratory variability, suggesting right atrial pressure of 15  mmHg. FINDINGS  Left Ventricle: Left ventricular ejection fraction, by estimation, is 50 to 55%. The left ventricle has low normal function. The left ventricle has no regional wall motion abnormalities. The left ventricular internal cavity size was normal in size. There is mild left ventricular hypertrophy. Left ventricular diastolic parameters are indeterminate. Right Ventricle: The right ventricular size is normal. No increase in right ventricular wall thickness. Right ventricular systolic function is normal. There is mildly elevated pulmonary artery systolic pressure. The tricuspid regurgitant velocity is 2.36  m/s, and with an assumed right atrial pressure of 15 mmHg, the estimated right ventricular systolic pressure is 37.3 mmHg. Left Atrium: Left atrial size was mildly dilated. Right Atrium: Right atrial size was mildly dilated. Pericardium: Trivial pericardial effusion is present. Mitral Valve: The mitral valve is degenerative in appearance. Severe mitral annular calcification. Mild to moderate mitral valve regurgitation. No evidence of mitral valve stenosis. Tricuspid Valve: The tricuspid valve is normal in structure. Tricuspid valve regurgitation is mild to moderate. Aortic Valve: The aortic valve is tricuspid. Aortic valve regurgitation is not visualized. No aortic stenosis is present. Pulmonic Valve: The pulmonic valve was not well visualized. Pulmonic valve regurgitation is trivial. Aorta: The aortic root and ascending aorta are structurally normal, with no evidence of dilitation. Venous: The inferior vena cava is dilated in size with less than 50% respiratory variability, suggesting right atrial pressure of 15 mmHg. IAS/Shunts: The atrial septum is grossly normal.  LEFT VENTRICLE PLAX 2D LVIDd:         4.20 cm LVIDs:         2.60 cm LV PW:         1.20 cm LV IVS:        1.10 cm LVOT diam:     1.60 cm LV SV:         20 LV SV Index:   11 LVOT Area:     2.01 cm  RIGHT VENTRICLE          IVC RV Basal diam:   2.00 cm  IVC diam: 2.60 cm LEFT ATRIUM             Index        RIGHT ATRIUM           Index LA diam:        4.30 cm 2.46 cm/m   RA Area:     21.00 cm LA Vol (A2C):   70.5 ml 40.37 ml/m  RA Volume:   57.10 ml  32.69 ml/m LA Vol (A4C):   70.2 ml 40.20 ml/m LA Biplane Vol: 70.0 ml 40.08 ml/m  AORTIC VALVE LVOT Vmax:   60.70 cm/s LVOT Vmean:  43.060 cm/s LVOT VTI:    0.099 m  AORTA Ao Root diam: 3.40 cm Ao Asc diam:  3.10 cm TRICUSPID VALVE TR Peak grad:   22.3 mmHg TR Vmax:        236.00 cm/s  SHUNTS Systemic VTI:  0.10 m Systemic Diam: 1.60 cm Lonni Nanas MD Electronically signed by Lonni Nanas MD Signature Date/Time: 06/07/2024/8:22:16 PM    Final     Disposition Pt is being discharged home today in good condition.  Follow-up Plans & Appointments  Follow-up Information     Lucien Orren SAILOR, PA-C. Go in 13 day(s).   Specialty: Cardiology Why: Follow up with Orren Lucien on 07/04/2024 at 8:05 AM.  Please arrive at the appointment 15 to 20 minutes early. Contact information: [address] [place] [address] [phone]  Discharge Instructions     Diet - low sodium heart healthy   Complete by: As directed    Increase activity slowly   Complete by: As directed        Discharge Medications Allergies as of 06/20/2024       Reactions   Pollen Extract Other (See Comments)   Amoxicillin Rash   Augmentin [amoxicillin-pot Clavulanate] Itching, Rash        Medication List     PAUSE taking these medications    empagliflozin  10 MG Tabs tablet Wait to take this until: June 22, 2024 Morning Commonly known as: Jardiance  Take 1 tablet (10 mg total) by mouth daily before breakfast.       STOP taking these medications    furosemide 40 MG tablet Commonly known as: LASIX       TAKE these medications    acetaminophen  500 MG tablet Commonly known as: TYLENOL  Take 500 mg by mouth every 6 (six) hours as needed for mild pain  (pain score 1-3) or moderate pain (pain score 4-6).   apixaban  2.5 MG Tabs tablet Commonly known as: ELIQUIS  Take 1 tablet (2.5 mg total) by mouth 2 (two) times daily. What changed:  medication strength how much to take   atorvastatin  10 MG tablet Commonly known as: LIPITOR Take 1 tablet (10 mg total) by mouth daily. What changed: when to take this   Biotin 1 MG Caps Take 1 tablet by mouth at bedtime.   Calcium  + D3 600-200 MG-UNIT Tabs Take 1 tablet by mouth daily.   calcium  carbonate 500 MG chewable tablet Commonly known as: TUMS - dosed in mg elemental calcium  Chew 1 tablet by mouth as needed for indigestion or heartburn.   docusate sodium  100 MG capsule Commonly known as: COLACE Take 100 mg by mouth as needed for mild constipation.   ELDERBERRY PO Take 1 each by mouth as needed (flu season, around sickness).   ezetimibe 10 MG tablet Commonly known as: ZETIA Take 1 tablet (10 mg total) by mouth daily.   famotidine  20 MG tablet Commonly known as: Pepcid  Take 1 tablet (20 mg total) by mouth daily. What changed:  when to take this reasons to take this   fish oil-omega-3 fatty acids  1000 MG capsule Take 1 g by mouth daily. w/ flax seed oil   fluticasone  50 MCG/ACT nasal spray Commonly known as: FLONASE  Place 2 sprays into both nostrils daily. What changed:  when to take this reasons to take this   losartan  25 MG tablet Commonly known as: COZAAR  Take 1 tablet (25 mg total) by mouth daily. What changed:  how much to take when to take this   Magnesium 300 MG Caps Take 1 capsule by mouth at bedtime.   Metoprolol  Tartrate 75 MG Tabs Take 1 tablet (75 mg total) by mouth 2 (two) times daily. What changed:  medication strength See the new instructions.   omeprazole 20 MG tablet Commonly known as: PRILOSEC OTC Take 20 mg by mouth daily.   PROBIOTIC DAILY PO Take 1 tablet by mouth at bedtime as needed.   Vitamin C  500 MG Chew Chew 500 mg by mouth at  bedtime.   Vitamin D  400 units capsule Take 400 Units by mouth as needed (if no calcium  with vitamin d ).         Outstanding Labs/Studies   Duration of Discharge Encounter: APP Time: 20 minutes   Signed, Morse Clause, PA-C 06/20/2024, 12:41 PM

## 2024-06-20 NOTE — Discharge Instructions (Signed)

## 2024-06-20 NOTE — TOC CM/SW Note (Signed)
 Transition of Care [facility]) - Inpatient Brief Assessment   Patient Details  Name: Kelli [name] MRN: [id number] Date of Birth: [birth year]  Transition of Care [facility]) CM/SW Contact:    Waddell Barnie Rama, RN Phone Number: 06/20/2024, 12:55 PM   Clinical Narrative: From home alone, has PCP and insurance on file, states has no HH services in place at this time or DME at home.  States family member  (daughter) will transport them home at [employer] and family is support system, states gets medications from [facility].  Pta self ambulatory.   There are no ICM needs identified  at this time.  Please place consult for any ICM needs.     Transition of Care Asessment: Insurance and Status: Insurance coverage has been reviewed Patient has primary care physician: Yes Home environment has been reviewed: home alone Prior level of function:: indep Prior/Current Home Services: No current home services Social Drivers of Health Review: SDOH reviewed no interventions necessary Readmission risk has been reviewed: Yes Transition of care needs: no transition of care needs at this time

## 2024-06-20 NOTE — Progress Notes (Addendum)
 Progress Note  Patient Name: Kelli [name] Date of Encounter: 06/20/2024 [facility] Cardiologist: Eh Sesay Swaziland, MD   Interval Summary   Feeling much better and less short of breath than did when was initially hospitalized.  Vital Signs Vitals:   06/19/24 1924 06/20/24 0300 06/20/24 0337 06/20/24 0716  BP: 125/70  107/64 93/73  Pulse:      Resp: 20  20 18   Temp: 98 F (36.7 C)  98.3 F (36.8 C) 97.7 F (36.5 C)  TempSrc: Oral  Oral Oral  SpO2: 97%  98% 98%  Weight:  70 kg    Height:        Intake/Output Summary (Last 24 hours) at 06/20/2024 0817 Last data filed at 06/20/2024 0300 Gross per 24 hour  Intake 597 ml  Output 2425 ml  Net -1828 ml      06/20/2024    3:00 AM 06/19/2024   10:00 AM 06/19/2024    3:46 AM  Last 3 Weights  Weight (lbs) 154 lb 6.4 oz 154 lb 8.7 oz 162 lb 11.2 oz  Weight (kg) 70.035 kg 70.1 kg 73.8 kg      Telemetry/ECG  Atrial fibrillation with heart rates in the 90's to 110's - Personally Reviewed  Physical Exam  GEN: No acute distress. Alert and orientated on room air  Neck: No JVD Cardiac: irregularly irregular rhythm and tachycardic. no murmurs, rubs, or gallops.  Respiratory: Clear to auscultation bilaterally. GI: Soft, nontender, non-distended  MS: No edema  Assessment & Plan   Acute on chronic HFpEF Admitted for shortness of breath, fatigue, Edema, and 15 lb weight gain. Was Diuresed and I/O down about 8 L since admission. Was transitioned to oral Lasix. Developed AKI suspect this is from over diuresis. TTE on 06/07/24 showed LVEF of 50-55%, No RWMA, mildly elevated pulmonary artery systolic pressure of 37.21mmHg, Mild to mod MR, Mild to mod TR, Severe MAC, trivial pericardial effustion, and IVC with <50% respiratory variation. Hold lasix and recheck creatinine. Ordered CBC and BMP. GDMT Continue Lopressor  75mg  BID. Will consider consolidating to once daily prior to DC Plan to restart Jardiance  after AKI resolves and after  DCCV.   Paroxysmal A-Fib with RVR CHA2DS2-VASc Score = 6 [CHF History: 1, HTN History: 1, Diabetes History: 0, Stroke History: 0, Vascular Disease History: 1, Age Score: 2, Gender Score: 1].  Therefore, the patient's annual risk of stroke is 9.7 %.    Previously had atrial fibrillation 10 years ago. Reported a flutter sensation but denies any other symptoms with the atrial fibrillation. Reported that symptoms have improved with diuresis. TSH normal. Continue Eliquis  2.5mg  BID. Correct dose given age and Creatinine. Continue Lopressor  75mg  BID. Plan for DCCV today. Denies any missed doses of Eliquis . Informed Consent   Shared Decision Making/Informed Consent The risks (stroke, cardiac arrhythmias rarely resulting in the need for a temporary or permanent pacemaker, skin irritation or burns and complications associated with conscious sedation including aspiration, arrhythmia, respiratory failure and death), benefits (restoration of normal sinus rhythm) and alternatives of a direct current cardioversion were explained in detail to Kelli [name] and she agrees to proceed.  Suspect patient may be able to be discharged today if DCCV is successful.      CAD with high calcium  score of 661 Hyperlipidemia LDL 86 on 4/25. Due to high calcium  score consider LDL goal of < 55. Prior documentation reported intolerance to higher statin dose. Order Lipid panel and LPA. Continue Lipitor to 10mg  daily. May consider starting  Zetia 10mg  daily. Doubt will be able to achieve goal LDL with low dose statin and Zetia so may consider referral to lipid clinic for PCK9 inhibitor.   Stage 3B CKD AKI Creatinine 1.88 on 10/5. Up from 1.59 the prior day. Will recheck as above.   HTN BP well managed. Most recent BP 93/73. Management per heart failure and afib above.       For questions or updates, please contact [facility] Please consult [email or web address] for contact info under        Signed, Kelli Clause, PA-C   Patient examined chart reviewed Exam with elderly female in no distress. No murmur lungs clear abdomen benign no edema. She is NPO has not missed any doses of eliquis . Low dose given age and renal function. For [facility] today around noon with Dr Floretta. Risks including stroke, intubation and PPM discussed with her and daughter at bedside.  Possbile d/c latter today if [facility] successful   Maude Emmer MD [facility]

## 2024-06-20 NOTE — TOC Transition Note (Signed)
 Transition of Care [facility]) - Discharge Note   Patient Details  Name: Kelli [name] MRN: [id number] Date of Birth: [birth year]  Transition of Care [facility]) CM/SW Contact:  Waddell Barnie Rama, RN Phone Number: 06/20/2024, 12:56 PM   Clinical Narrative:    For dc today, daughter is at the bedside to transport her home.         Patient Goals and CMS Choice            Discharge Placement                       Discharge Plan and Services Additional resources added to the After Visit Summary for                                       Social Drivers of Health (SDOH) Interventions SDOH Screenings   Food Insecurity: No Food Insecurity (06/17/2024)  Housing: Low Risk  (06/17/2024)  Transportation Needs: No Transportation Needs (06/17/2024)  Utilities: Not At Risk (06/17/2024)  Alcohol Screen: Low Risk  (09/07/2023)  Depression (PHQ2-9): Low Risk  (01/11/2024)  Financial Resource Strain: Low Risk  (01/26/2024)  Physical Activity: Sufficiently Active (01/26/2024)  Social Connections: Moderately Integrated (06/17/2024)  Stress: No Stress Concern Present (01/26/2024)  Tobacco Use: Low Risk  (06/15/2024)  Health Literacy: Adequate Health Literacy (09/07/2023)     Readmission Risk Interventions    06/20/2024   12:54 PM  Readmission Risk Prevention Plan  Post Dischage Appt Complete  Medication Screening Complete  Transportation Screening Complete

## 2024-06-20 NOTE — Progress Notes (Signed)
 Heart Failure Navigator Progress Note  Assessed for Heart & Vascular TOC clinic readiness.  Patient does not meet criteria due to EF 50-555, had successful cardioversion on 06/20/2024. Has a scheduled [facility] appointment on 07/06/2024. No HF TOC. .   Navigator will sign off at this time.   Stephane Haddock, BSN, Scientist, clinical (histocompatibility and immunogenetics) Only

## 2024-06-20 NOTE — Interval H&P Note (Signed)
 History and Physical Interval Note:  06/20/2024 11:09 AM  Kelli [name]  has presented today for surgery, with the diagnosis of afib.  The various methods of treatment have been discussed with the patient and family. After consideration of risks, benefits and other options for treatment, the patient has consented to  Procedure(s): CARDIOVERSION (N/A) as a surgical intervention.  The patient's history has been reviewed, patient examined, no change in status, stable for surgery.  I have reviewed the patient's chart and labs.  Questions were answered to the patient's satisfaction.     [name]

## 2024-06-20 NOTE — H&P (View-Only) (Signed)
 Progress Note  Patient Name: Kelli [name] Date of Encounter: 06/20/2024 [facility] Cardiologist: Eh Sesay [place], MD   Interval Summary   Feeling much better and less short of breath than did when was initially hospitalized.  Vital Signs Vitals:   06/19/24 1924 06/20/24 0300 06/20/24 0337 06/20/24 0716  BP: 125/70  107/64 93/73  Pulse:      Resp: 20  20 18   Temp: 98 F (36.7 C)  98.3 F (36.8 C) 97.7 F (36.5 C)  TempSrc: Oral  Oral Oral  SpO2: 97%  98% 98%  Weight:  70 kg    Height:        Intake/Output Summary (Last 24 hours) at 06/20/2024 0817 Last data filed at 06/20/2024 0300 Gross per 24 hour  Intake 597 ml  Output 2425 ml  Net -1828 ml      06/20/2024    3:00 AM 06/19/2024   10:00 AM 06/19/2024    3:46 AM  Last 3 Weights  Weight (lbs) 154 lb 6.4 oz 154 lb 8.7 oz 162 lb 11.2 oz  Weight (kg) 70.035 kg 70.1 kg 73.8 kg      Telemetry/ECG  Atrial fibrillation with heart rates in the 90's to 110's - Personally Reviewed  Physical Exam  GEN: No acute distress. Alert and orientated on room air  Neck: No JVD Cardiac: irregularly irregular rhythm and tachycardic. no murmurs, rubs, or gallops.  Respiratory: Clear to auscultation bilaterally. GI: Soft, nontender, non-distended  MS: No edema  Assessment & Plan   Acute on chronic HFpEF Admitted for shortness of breath, fatigue, Edema, and 15 lb weight gain. Was Diuresed and I/O down about 8 L since admission. Was transitioned to oral Lasix. Developed AKI suspect this is from over diuresis. TTE on 06/07/24 showed LVEF of 50-55%, No RWMA, mildly elevated pulmonary artery systolic pressure of 37.21mmHg, Mild to mod MR, Mild to mod TR, Severe MAC, trivial pericardial effustion, and IVC with <50% respiratory variation. Hold lasix and recheck creatinine. Ordered CBC and BMP. GDMT Continue Lopressor  75mg  BID. Will consider consolidating to once daily prior to DC Plan to restart Jardiance  after AKI resolves and after  DCCV.   Paroxysmal A-Fib with RVR CHA2DS2-VASc Score = 6 [CHF History: 1, HTN History: 1, Diabetes History: 0, Stroke History: 0, Vascular Disease History: 1, Age Score: 2, Gender Score: 1].  Therefore, the patient's annual risk of stroke is 9.7 %.    Previously had atrial fibrillation 10 years ago. Reported a flutter sensation but denies any other symptoms with the atrial fibrillation. Reported that symptoms have improved with diuresis. TSH normal. Continue Eliquis  2.5mg  BID. Correct dose given age and Creatinine. Continue Lopressor  75mg  BID. Plan for DCCV today. Denies any missed doses of Eliquis . Informed Consent   Shared Decision Making/Informed Consent The risks (stroke, cardiac arrhythmias rarely resulting in the need for a temporary or permanent pacemaker, skin irritation or burns and complications associated with conscious sedation including aspiration, arrhythmia, respiratory failure and death), benefits (restoration of normal sinus rhythm) and alternatives of a direct current cardioversion were explained in detail to [name] and she agrees to proceed.  Suspect patient may be able to be discharged today if DCCV is successful.      CAD with high calcium  score of 661 Hyperlipidemia LDL 86 on 4/25. Due to high calcium  score consider LDL goal of < 55. Prior documentation reported intolerance to higher statin dose. Order Lipid panel and LPA. Continue Lipitor to 10mg  daily. May consider starting  Zetia 10mg  daily. Doubt will be able to achieve goal LDL with low dose statin and Zetia so may consider referral to lipid clinic for PCK9 inhibitor.   Stage 3B CKD AKI Creatinine 1.88 on 10/5. Up from 1.59 the prior day. Will recheck as above.   HTN BP well managed. Most recent BP 93/73. Management per heart failure and afib above.       For questions or updates, please contact [facility] Please consult [email or web address] for contact info under        Signed, Morse Clause, PA-C   Patient examined chart reviewed Exam with elderly female in no distress. No murmur lungs clear abdomen benign no edema. She is NPO has not missed any doses of eliquis . Low dose given age and renal function. For [facility] today around noon with Dr Floretta. Risks including stroke, intubation and PPM discussed with her and daughter at bedside.  Possbile d/c latter today if [facility] successful   Maude Emmer MD [facility]

## 2024-06-20 NOTE — Transfer of Care (Signed)
 Immediate Anesthesia Transfer of Care Note  Patient: Kelli [name]  Procedure(s) Performed: CARDIOVERSION  Patient Location: PACU  Anesthesia Type:MAC  Level of Consciousness: awake, alert , and oriented  Airway & Oxygen Therapy: Patient Spontanous Breathing and Patient connected to nasal cannula oxygen  Post-op Assessment: Report given to RN and Post -op Vital signs reviewed and stable  Post vital signs: Reviewed and stable  Last Vitals:  Vitals Value Taken Time  BP    Temp    Pulse    Resp    SpO2      Last Pain:  Vitals:   06/20/24 1024  TempSrc:   PainSc: 0-No pain         Complications: No notable events documented.

## 2024-06-20 NOTE — Progress Notes (Signed)
 MEWS Progress Note  Patient Details Name: Loan C Zehnder MRN: [id number] DOB: November 16, 1940 Today's Date: 06/20/2024   MEWS Flowsheet Documentation:  Assess: MEWS Score Temp: 97.7 F (36.5 C) BP: 93/73 MAP (mmHg): 80 Pulse Rate: 92 ECG Heart Rate: (!) 102 Resp: 18 Level of Consciousness: Alert SpO2: 98 % O2 Device: Room Air Assess: MEWS Score MEWS Temp: 0 MEWS Systolic: 1 MEWS Pulse: 1 MEWS RR: 0 MEWS LOC: 0 MEWS Score: 2 MEWS Score Color: Yellow Assess: SIRS CRITERIA SIRS Temperature : 0 SIRS Respirations : 0 SIRS Pulse: 1 SIRS WBC: 0 SIRS Score Sum : 1 Assess: if the MEWS score is Yellow or Red Were vital signs accurate and taken at a resting state?: Yes Does the patient meet 2 or more of the SIRS criteria?: No MEWS guidelines implemented : Yes, yellow Treat MEWS Interventions: Considered administering scheduled or prn medications/treatments as ordered Take Vital Signs Increase Vital Sign Frequency : Yellow: Q2hr x1, continue Q4hrs until patient remains green for 12hrs Escalate MEWS: Escalate: Yellow: Discuss with charge nurse and consider notifying provider and/or RRT        Mitzie Mau 06/20/2024, 8:30 AM

## 2024-06-20 NOTE — Anesthesia Preprocedure Evaluation (Addendum)
 Anesthesia Evaluation  Patient identified by MRN, date of birth, ID band Patient awake    Reviewed: Allergy & Precautions, NPO status , Patient's Chart, lab work & pertinent test results, reviewed documented beta blocker date and time   History of Anesthesia Complications Negative for: history of anesthetic complications  Airway Mallampati: II       Dental  (+) Teeth Intact, Dental Advisory Given   Pulmonary    breath sounds clear to auscultation       Cardiovascular hypertension, + Past MI  + dysrhythmias Atrial Fibrillation  Rhythm:Irregular Rate:Normal  TTE (2025): 1. Left ventricular ejection fraction, by estimation, is 50 to 55%. The  left ventricle has low normal function. The left ventricle has no regional  wall motion abnormalities. There is mild left ventricular hypertrophy.  Left ventricular diastolic  parameters are indeterminate.   2. Right ventricular systolic function is normal. The right ventricular  size is normal. There is mildly elevated pulmonary artery systolic  pressure. The estimated right ventricular systolic pressure is 37.3 mmHg.   3. Left atrial size was mildly dilated.   4. Right atrial size was mildly dilated.   5. The mitral valve is degenerative. Mild to moderate mitral valve  regurgitation. No evidence of mitral stenosis. Severe mitral annular  calcification.   6. Tricuspid valve regurgitation is mild to moderate.   7. The aortic valve is tricuspid. Aortic valve regurgitation is not  visualized. No aortic stenosis is present.   8. The inferior vena cava is dilated in size with <50% respiratory  variability, suggesting right atrial pressure of 15 mmHg.     Neuro/Psych    GI/Hepatic ,GERD  ,,  Endo/Other    Renal/GU Renal disease     Musculoskeletal  (+) Arthritis ,    Abdominal   Peds  Hematology   Anesthesia Other Findings   Reproductive/Obstetrics                               Anesthesia Physical Anesthesia Plan  ASA: 3  Anesthesia Plan: General   Post-op Pain Management:    Induction:   PONV Risk Score and Plan: 1  Airway Management Planned: Simple Face Mask and Natural Airway  Additional Equipment:   Intra-op Plan:   Post-operative Plan: Extubation in OR  Informed Consent:      Dental advisory given  Plan Discussed with: CRNA and Surgeon  Anesthesia Plan Comments: (Appropriately NPO. Persistent burping without vomiting or nausea lasting greater than 1 month. Plan for bicitrate preoperatively. Discussed risks of aspiration with patient and surgical team. )         Anesthesia Quick Evaluation

## 2024-06-20 NOTE — CV Procedure (Signed)
    CARDIOVERSION    Procedure:   DCCV  Indication:  Symptomatic atrial fibrillation  Procedure Note:  The patient signed informed consent.  The patient has had therapeutic oral anticoagulation uninterrupted for greater than 3 weeks.  Anesthesia was administered per the anesthesia team.  Adequate airway was maintained throughout and vital followed per protocol.  The patient was cardioverted x 1 with 360J of biphasic synchronized energy.  The patient converted to NSR.  There were no apparent complications.  The patient had normal neuro status and respiratory status post procedure with vitals stable as recorded elsewhere.     Kelli Navarrete T. Floretta HEATH, MD [place]  [facility]  11:23 AM

## 2024-06-21 ENCOUNTER — Encounter (HOSPITAL_COMMUNITY): Payer: Self-pay | Admitting: Student in an Organized Health Care Education/Training Program

## 2024-06-21 ENCOUNTER — Telehealth: Payer: Self-pay | Admitting: *Deleted

## 2024-06-21 NOTE — Transitions of Care (Post Inpatient/ED Visit) (Signed)
 06/21/2024  Name: Kelli [name] MRN: [id number] DOB: 1941-05-26  Today's TOC FU Call Status: Today's TOC FU Call Status:: Successful TOC FU Call Completed TOC FU Call Complete Date: 06/21/24 Patient's Name and Date of Birth confirmed.  Transition Care Management Follow-up Telephone Call Date of Discharge: 06/20/24 Discharge Facility: [facility]) Type of Discharge: Inpatient Admission Primary Inpatient Discharge Diagnosis:: Congestive Heart Failure How have you been since you were released from the hospital?: Better (Doing so much better. No Shortnes of breath) Any questions or concerns?: No  Items Reviewed: Did you receive and understand the discharge instructions provided?: Yes Medications obtained,verified, and reconciled?: Yes (Medications Reviewed) Any new allergies since your discharge?: No Dietary orders reviewed?: No Do you have support at home?: Yes People in Home [RPT]: alone Name of Support/Comfort Primary Source: D[place] and [address]  Medications Reviewed Today: Medications Reviewed Today     Reviewed by Kennieth Cathlean DEL, RN (Case Manager) on 06/21/24 at 1123  Med List Status: <None>   Medication Order Taking? Sig Documenting Provider Last Dose Status Informant  acetaminophen  (TYLENOL ) 500 MG tablet [id number] Yes Take 500 mg by mouth every 6 (six) hours as needed for mild pain (pain score 1-3) or moderate pain (pain score 4-6). [provider]  Active Self, Pharmacy Records  apixaban  (ELIQUIS ) 2.5 MG TABS tablet [id number] Yes Take 1 tablet (2.5 mg total) by mouth 2 (two) times daily. Adams, Zane, PA-C  Active   Ascorbic Acid  (VITAMIN C ) 500 MG CHEW [id number] Yes Chew 500 mg by mouth at bedtime. [provider]  Active Self, Pharmacy Records  atorvastatin  (LIPITOR) 10 MG tablet [id number] Yes Take 1 tablet (10 mg total) by mouth daily.  Patient taking differently: Take 10 mg by mouth at bedtime.   Chandra Toribio POUR, MD  Active Self, Pharmacy  Records           Med Note [facility], ALFREIDA CROME   Fri Jun 17, 2024  9:07 PM) Patient stated this medication make her ache  Biotin 1 MG CAPS [id number] Yes Take 1 tablet by mouth at bedtime. [provider]  Active Self, Pharmacy Records  Calcium  Carb-Cholecalciferol  (CALCIUM  + D3) 600-200 MG-UNIT TABS [id number] Yes Take 1 tablet by mouth daily. [provider]  Active Self, Pharmacy Records  calcium  carbonate (TUMS - DOSED IN MG ELEMENTAL CALCIUM ) 500 MG chewable tablet [id number] Yes Chew 1 tablet by mouth as needed for indigestion or heartburn. [provider]  Active Self, Pharmacy Records  Cholecalciferol  (VITAMIN D ) 400 UNITS capsule [id number] Yes Take 400 Units by mouth as needed (if no calcium  with vitamin d ). [provider]  Active Self, Pharmacy Records  docusate sodium  (COLACE) 100 MG capsule [id number] Yes Take 100 mg by mouth as needed for mild constipation. [provider]  Active Self, Pharmacy Records  ELDERBERRY PO [id number] Yes Take 1 each by mouth as needed (flu season, around sickness). [provider]  Active Self, Pharmacy Records           Med Note EFRAIM, ALFREIDA CROME   Fri Jun 17, 2024  8:48 PM)    empagliflozin  (JARDIANCE ) 10 MG TABS tablet [id number] Yes Take 1 tablet (10 mg total) by mouth daily before breakfast. Chandra Toribio POUR, MD  Active Self, Pharmacy Records  ezetimibe (ZETIA) 10 MG tablet [id number] Yes Take 1 tablet (10 mg total) by mouth daily. Adams, Zane, PA-C  Active   famotidine  (PEPCID ) 20 MG tablet [id number] Yes Take 1 tablet (20  mg total) by mouth daily.  Patient taking differently: Take 20 mg by mouth as needed for heartburn or indigestion (if no prilosec).   Chandra Toribio POUR, MD  Active Self, Pharmacy Records  fish oil-omega-3 fatty acids  1000 MG capsule [id number] Yes Take 1 g by mouth daily. w/ flax seed oil [provider]  Active Self, Pharmacy Records  fluticasone  (FLONASE ) 50 MCG/ACT nasal spray  [id number] Yes Place 2 sprays into both nostrils daily.  Patient taking differently: Place 2 sprays into both nostrils as needed for allergies.   Boscia, Heather E, NP  Active Self, Pharmacy Records  losartan  (COZAAR ) 25 MG tablet [id number] Yes Take 1 tablet (25 mg total) by mouth daily. Michele Richardson, DO  Active Self, Pharmacy Records  Magnesium 300 MG CAPS [id number] Yes Take 1 capsule by mouth at bedtime. [provider]  Active Self, Pharmacy Records           Med Note EFRAIM, ALFREIDA CROME   Fri Jun 17, 2024  8:48 PM)    metoprolol  tartrate 75 MG TABS [id number] Yes Take 1 tablet (75 mg total) by mouth 2 (two) times daily. Adams, Zane, PA-C  Active   omeprazole (PRILOSEC OTC) 20 MG tablet [id number] Yes Take 20 mg by mouth daily. [provider]  Active Self, Pharmacy Records  Probiotic Product (PROBIOTIC DAILY PO) [id number] Yes Take 1 tablet by mouth at bedtime as needed. [provider]  Active Self, Pharmacy Records            Home Care and Equipment/Supplies: Were Home Health Services Ordered?: NA Any new equipment or medical supplies ordered?: NA  Functional Questionnaire: Do you need assistance with bathing/showering or dressing?: No Do you need assistance with meal preparation?: No Do you need assistance with eating?: No Do you have difficulty maintaining continence: No Do you need assistance with getting out of bed/getting out of a chair/moving?: No Do you have difficulty managing or taking your medications?: No  Follow up appointments reviewed: PCP Follow-up appointment confirmed?: Yes Date of PCP follow-up appointment?: 07/13/24 (Patient is scheduled for a physical on [id number]. She is going to contact the PCP and seen if he wants to do the f/u then or schedule another date.) Follow-up Provider: Toribio Chandra Specialist [facility] Follow-up appointment confirmed?: Yes Date of Specialist follow-up appointment?: 07/04/24 Follow-Up Specialty Provider::  Orren Fabry Do you need transportation to your follow-up appointment?: No Do you understand care options if your condition(s) worsen?: Yes-patient verbalized understanding  SDOH Interventions Today    Flowsheet Row Most Recent Value  SDOH Interventions   Food Insecurity Interventions Intervention Not Indicated  Housing Interventions Intervention Not Indicated  Transportation Interventions Intervention Not Indicated  Utilities Interventions Intervention Not Indicated  Discussed and offered 30 day TOC program.  Patient declined.  The patient has been provided with contact information for the care management team and has been advised to call with any health -related questions or concerns.  The patient verbalized understanding with current plan of care.  The patient is directed to their insurance card regarding availability of benefits coverage.   Cathlean Headland BSN RN [facility] Management Coordinator [email or web address] Direct Dial: [phone]  Fax: [phone] Website: [email or web address]

## 2024-06-26 DIAGNOSIS — I480 Paroxysmal atrial fibrillation: Secondary | ICD-10-CM

## 2024-07-01 ENCOUNTER — Ambulatory Visit (INDEPENDENT_AMBULATORY_CARE_PROVIDER_SITE_OTHER): Admitting: Family Medicine

## 2024-07-01 ENCOUNTER — Encounter: Payer: Self-pay | Admitting: Family Medicine

## 2024-07-01 VITALS — BP 149/79 | HR 75 | Ht 63.0 in | Wt 159.4 lb

## 2024-07-01 DIAGNOSIS — I5032 Chronic diastolic (congestive) heart failure: Secondary | ICD-10-CM | POA: Diagnosis not present

## 2024-07-01 DIAGNOSIS — E782 Mixed hyperlipidemia: Secondary | ICD-10-CM | POA: Diagnosis not present

## 2024-07-01 DIAGNOSIS — N1832 Chronic kidney disease, stage 3b: Secondary | ICD-10-CM

## 2024-07-01 DIAGNOSIS — J069 Acute upper respiratory infection, unspecified: Secondary | ICD-10-CM | POA: Diagnosis not present

## 2024-07-01 DIAGNOSIS — I5033 Acute on chronic diastolic (congestive) heart failure: Secondary | ICD-10-CM

## 2024-07-01 LAB — POC SOFIA 2 FLU + SARS ANTIGEN FIA
Influenza A, POC: NEGATIVE
Influenza B, POC: NEGATIVE
SARS Coronavirus 2 Ag: NEGATIVE

## 2024-07-01 NOTE — Progress Notes (Unsigned)
 Cardiology Office Note   Date:  07/04/2024  ID:  Aleksa C Guerry, DOB 1941/05/28, MRN [id number] PCP: Chandra Toribio POUR, MD  [facility] Providers Cardiologist:  Peter Swaziland, MD Cardiology APP:  Madie Jon Garre, [place]   History of Present Illness Kelli [name] is a 83 y.o. female who with a past medical history of hypertension, paroxysmal atrial fibrillation with RVR who presents today for follow-up appointment.  Was admitted in 2014 and had demand ischemia.  Myoview study was normal.  Echo from [place] 2014 with EF 55 to 60%, grade 1 DD.  Has been on chronic therapy with metoprolol  and Eliquis .  She was seen Summer of 2025 by Jon Madie, PA-C with symptoms of DOE.  Echo showed grade 2 diastolic dysfunction and normal EF.  Moderate MR and mild to moderate TR.  BNP 151.  Coronary CTA showed a high calcium  score of 631 but mild nonobstructive CAD.  There was a questionable left atrial appendage thrombus.  Patient already on Eliquis .  Pulmonary nodule noted with recommendations for follow-up CT at 6 to 12 months.  Mild thickening of esophagus.  She did have repeat CT showing no change in pulmonary nodule, benign.  She did have 1 episode of atrial fibrillation the night prior to her last office visit in July where her heart rate was 122 bpm.  Lasted about an hour and then she converted.  Does note some shortness of breath when walking uphill.  Stated that it was better if she holds her thigh.  Like to have more energy.  No yesterday, she was called regarding her lab work.  Unfortunately she reported shortness of breath and 10 pound weight gain since seeing us  on 9/11.  Stated that mainly her ankles were swollen and puffy.  She just wants to sleep and has no energy.  Also reports tightness under her breast but is not painful.  She was restarted on HCTZ.  She she is set up to see me today to evaluate volume status.  I saw her 10/3, she presents with paroxysmal atrial fibrillation and fluid  overload who presents with worsening shortness of breath and fatigue. She was referred by Dr. Holmes for evaluation of her worsening symptoms.  Over the past week, she experiences worsening shortness of breath and fatigue, particularly when walking. She describes head pressure and a tightening sensation, which she refers to as 'tiredness' and 'heaviness'.  She has atrial fibrillation with heart rate fluctuations between 66 and 134 bpm and episodes of tachycardia noted during an echocardiogram. She experiences 'flutters' even at rest, and her heart rate is irregular. A heart monitor was recently mailed in, but results are pending.  She has fluid overload, with a weight gain of approximately 15 to 20 pounds. Initially, she lost 15 pounds but has since regained the weight. Her ProBNP levels are elevated, and she is on diuretics. Hydrochlorothiazide  was discontinued due to high potassium levels, and she is now prescribed oral Lasix.  Her kidney function has been fluctuating, with creatinine levels ranging from 1.2 to 1.7 over the past months. Her current creatinine is 1.6. Her kidney function is closely monitored due to her fluid status and diuretic use.  Discussed the use of AI scribe software for clinical note transcription with the patient, who gave verbal consent to proceed.  On arrival at the hospital, BNP was elevated at 856.2.  Chest x-ray showed central pulmonary vascular congestion without overt pulmonary edema.  Started on IV diuretics and I's and  O's were down about 8 L since admission.  Transition to oral Lasix.  Developed slight AKI which was suspected from overdiuresis.  Heart failure was likely related to decreased cardiac output from the atrial fibrillation with RVR.  Lasix was stopped and daily weights were recommended.  Lopressor  was titrated to 75 mg twice daily and the plan was to restart Jardiance  10 mg 2 days after AKI resolves and after DCCV.  She had a CHA2DS2-VASc score of 6 with an  annual stroke risk of 9.7%.  Had atrial fibrillation 10 years ago.  No missed doses of Eliquis .  Underwent successful DCCV and was stable for discharge.  Dose of Eliquis  was changed to 2.5 mg twice daily due to her age and creatinine.  Today, she presents with  a hx of atrial fibrillation and volume overload for cardiovascular follow-up.  She experienced a period of atrial fibrillation in early September, as indicated by her Fitbit, but has not had issues since her last visit. She is on a higher dose of metoprolol . Muscle aches are present, which she attributes to Lipitor and Zetia. Her creatinine level is 1.2, and she has chronic kidney disease. This is a large improvement from a month ago where her creatinine was 1.8. She was previously on hydrochlorothiazide  for blood pressure. Her losartan  dose was lowered after a recent hospital stay, and she is currently on a low dose. Her blood pressure has been slightly elevated. She has not had any fluid reaccumulation and to her knowledge she has not been back in atrial fibrillation.  Reports no shortness of breath nor dyspnea on exertion. Reports no chest pain, pressure, or tightness. No edema, orthopnea, PND. Reports no palpitations.   Discussed the use of AI scribe software for clinical note transcription with the patient, who gave verbal consent to proceed.    ROS: Pertinent ROS in HPI  Studies Reviewed      Echo 03/18/23: IMPRESSIONS     1. Left ventricular ejection fraction, by estimation, is 60 to 65%. The  left ventricle has normal function. The left ventricle has no regional  wall motion abnormalities. There is mild left ventricular hypertrophy.  Left ventricular diastolic parameters  are consistent with Grade II diastolic dysfunction (pseudonormalization).   2. Right ventricular systolic function is normal. The right ventricular  size is normal. There is normal pulmonary artery systolic pressure.   3. Left atrial size was moderately  dilated.   4. The mitral valve is degenerative. Mild to moderate mitral valve  regurgitation.   5. Tricuspid valve regurgitation is mild to moderate.   6. The aortic valve is grossly normal. Aortic valve regurgitation is not  visualized.   7. The inferior vena cava is normal in size with greater than 50%  respiratory variability, suggesting right atrial pressure of 3 mmHg.    OVER-READ INTERPRETATION  PET-CT CHEST   The following report is an over-read performed by radiologist Dr. Camellia Lang [facility] on 04/25/2023. This over-read does not include interpretation of cardiac or coronary anatomy or pathology. The cardiac CT interpretation by the cardiologist is to be attached.   COMPARISON:  None.   FINDINGS: Mild circumferential wall thickening noted distal esophagus with tiny hiatal hernia evident.   6 mm posterior right middle lobe pulmonary nodule identified on image 18/11.   4 mm low-density lesion in the dome of the liver is too small to characterize but is statistically most likely benign. No followup imaging is recommended.   No suspicious lytic  or sclerotic osseous abnormality.   IMPRESSION: 1. 6 mm posterior right middle lobe pulmonary nodule. Non-contrast chest CT at 6-12 months is recommended. If the nodule is stable at time of repeat CT, then future CT at 18-24 months (from today's scan) is considered optional for low-risk patients, but is recommended for high-risk patients. This recommendation follows the consensus statement: Guidelines for Management of Incidental Pulmonary Nodules Detected on CT Images: From the Fleischner Society 2017; Radiology 2017; 284:228-243. 2. Mild circumferential wall thickening distal esophagus with tiny hiatal hernia. Correlation for symptoms of esophagitis recommended.   These results will be called to the ordering clinician or representative by the Radiologist Assistant, and communication documented in the PACS  or [employer].     Electronically Signed   By: Camellia Candle M.D.   On: 04/25/2023 07:44   EXAM: Cardiac/Coronary CTA   TECHNIQUE: A non-contrast, gated CT scan was obtained with axial slices of 3 mm through the heart for calcium  scoring. Calcium  scoring was performed using the Agatston method. A 120 kV prospective, gated, contrast cardiac scan was obtained. Gantry rotation speed was 250 msecs and collimation was 0.6 mm. Two sublingual nitroglycerin  tablets (0.8 mg) were given. The 3D data set was reconstructed in 5% intervals of the 35-75% of the R-R cycle. Diastolic phases were analyzed on a dedicated workstation using MPR, MIP, and VRT modes. The patient received 95 cc of contrast.   FINDINGS: Image quality: Excellent.   Noise artifact is: Limited.   Coronary Arteries:  Normal coronary origin.  Right dominance.   Left main: The left main is a large caliber vessel with a normal take off from the left coronary cusp that bifurcates to form a left anterior descending artery and a left circumflex artery. There is no plaque or stenosis.   Left anterior descending artery: The LAD has minimal (0-24) calcified plaque in the proximal and mid vessel. The LAD gives off 1 small diagonal with mild (25-49) ostial stenosis and mild (25-49) plaque in the mid vessel.   Left circumflex artery: The LCX is non-dominant and has minimal (0-24) plaque in the proximal vessel and mild (25-49) plaque in the mid vessel. The LCX gives off 2 large, patent obtuse marginal branches.   Right coronary artery: The RCA is dominant with normal take off from the right coronary cusp. There is mild (25-49) plaque in the proximal to mid vessel. The RCA terminates as a PDA and right posterolateral branch without evidence of plaque or stenosis.   Right Atrium: Right atrial size is within normal limits.   Right Ventricle: The right ventricular cavity is within normal limits.   Left Atrium: Left  atrial size is enlarged; cannot R/O thrombus at left atrial appendage tip.   Left Ventricle: The ventricular cavity size is within normal limits.   Pulmonary arteries: Normal in size.   Pulmonary veins: Normal pulmonary venous drainage.   Pericardium: Normal thickness without significant effusion or calcium  present.   Cardiac valves: The aortic valve is trileaflet without significant calcification. The mitral valve is normal; significant mitral annular calcification.   Aorta: Normal caliber with aortic atherosclerosis.   Extra-cardiac findings: See attached radiology report for non-cardiac structures.   IMPRESSION: 1. Coronary calcium  score of 661. This was 23 percentile for age-, sex, and race-matched controls.   2. Total plaque volume 577 mm3 which is 59 percentile for age- and sex-matched controls (calcified plaque 128 mm3; non-calcified plaque 449 mm3). TPV is (severe).   3. Normal coronary origin  with right dominance.   4. Mild coronary artery disease in the LAD, diagonal, Lcx and RCA as outlined.   5. Aortic atherosclerosis.   6. LAE; cannot R/O left atrial appendage thrombus.   RECOMMENDATIONS: CAD-RADS 2: Mild non-obstructive CAD (25-49%). Consider non-atherosclerotic causes of chest pain. Consider preventive therapy and risk factor modification.      Physical Exam VS:  BP (!) 160/82 (BP Location: Right Arm, Patient Position: Sitting, Cuff Size: Normal)   Pulse 76   Ht 5' 3 (1.6 m)   Wt 154 lb (69.9 kg)   SpO2 97%   BMI 27.28 kg/m        Wt Readings from Last 3 Encounters:  07/04/24 154 lb (69.9 kg)  07/01/24 159 lb 6.4 oz (72.3 kg)  06/20/24 154 lb 6.4 oz (70 kg)    GEN: Well nourished, well developed in no acute distress NECK: No JVD; No carotid bruits CARDIAC: IRIR, no murmurs, rubs, gallops RESPIRATORY:  Clear to auscultation without rales, wheezing or rhonchi  ABDOMEN: Soft, non-tender, non-distended EXTREMITIES:  No edema; No deformity    ASSESSMENT AND PLAN  Paroxysmal atrial fibrillation No current AFib issues. Previous episode with rapid ventricular response. Fluid overload likely related to AFib. - Use MyChart for direct communication to expedite care. - Avoid medications with phenylephrine due to potential impact on heart rate. - Continue current medication regimen with an increase of losartan  due to blood pressure -She is on Eliquis  2.5 mg twice daily, recently lowered due to her kidney disease, now that creatinine is back to baseline we will reach out to Dr. Swaziland to see if this would be appropriate to increase back to 5 mg twice a day  Chronic combined systolic and diastolic heart failure Fluid overload likely secondary to AFib. Diuretics adjusted based on kidney function and fluid status. - Use diuretics as needed rather than daily to preserve kidney function.  Chronic kidney disease stage 3b Recent improvement in kidney function with creatinine at 1.2. Previous elevation likely due to fluid overload and AFib. Current management includes avoiding nephrotoxic agents and using losartan  for kidney perfusion. - Avoid NSAIDs and unnecessary diuretics. - Maintain hydration. - Monitor kidney function regularly. - Consider nephrology referral if kidney function worsens.  Mixed hyperlipidemia with statin intolerance Intolerance to Lipitor and Zetia due to muscle aches. Considering transition to Repatha, a PCSK9 inhibitor, which may be better tolerated and may modestly increase HDL levels. - Initiate prior authorization process for Repatha. - Discontinue Zetia upon starting Repatha. - Wean off Lipitor gradually after Repatha is established. - Recheck lipid panel in 3 months to assess LDL levels.  Essential hypertension Blood pressure elevated recently, possibly due to acute illness. Current management includes low-dose losartan . - Monitor blood pressure for 1-2 weeks. - Increase losartan  to 50 mg if blood pressure  remains in 150s-160s. - Use current losartan  supply by taking two 25 mg tablets if needed.  Acute upper respiratory infection Symptoms consistent with viral upper respiratory infection. Cough and nasal drainage present. - Use Xopenex inhaler for wheezing. - Use Mucinex for nasal drainage. - Use Allegra for allergy symptoms. - Avoid decongestants with phenylephrine.    Dispo: She can return in 2-3 months with me  Signed, Orren LOISE Fabry, PA-C

## 2024-07-01 NOTE — Assessment & Plan Note (Signed)
-   Acute onset of URI symptoms this week including congestion and cough. Denies fever or body aches. Exam is unremarkable. No signs of pneumonia. - COVID/Flu swab performed in office, results pending. - Counseled on supportive care for symptoms.

## 2024-07-01 NOTE — Assessment & Plan Note (Signed)
-   Recent hospitalization for IV diuresis and successful cardioversion for atrial fibrillation. Weight today is 155 lbs. Previous baseline was ~160 lbs, with weight reaching near 170 lbs with fluid gain. Lungs are now clear on exam. - Continue to monitor daily weights. - Follow up with cardiology PA as planned.

## 2024-07-01 NOTE — Patient Instructions (Signed)
 It was nice to see you today,  We addressed the following topics today: - We will test you for COVID and the flu today. This will help us  know if you need to isolate from work or inform people you have been around. - For your cold symptoms: - For nasal congestion, you can use nasal saline rinses and Flonase  nasal spray daily. - You can use Afrin nasal spray for acute, severe congestion, but do not use it for more than a couple of days as it can raise your blood pressure and cause rebound congestion. - Coricidin HBP is safe to use for cold symptoms with your high blood pressure. - Mucinex (guaifenesin) can be used if you develop a productive cough. - For any aches, pains, or fever, Tylenol  is the safest option. - I do not think you need an antibiotic. - Regarding your weight, please continue to weigh yourself every morning. Do it at the same time, after using the restroom, with minimal or no clothing, and on a hard, flat surface (not a carpet). - If your weight goes above 160 lbs, especially if you also feel more short of breath, please let me or your cardiology team know. - We will consider starting Repatha for your cholesterol if you cannot tolerate the current medications. This is an injection you would give yourself twice a month. It is very effective and has a low risk of muscle aches.  Have a great day,  Rolan Slain, MD

## 2024-07-01 NOTE — Assessment & Plan Note (Signed)
-   History of statin intolerance (myalgias with Lipitor 20mg ). Currently on Lipitor 10mg . Recently started on Zetia by cardiology and reports new joint aching, though this may be viral. Plan is to consider referral to lipid clinic (Dr. Katharina office) for Repatha injections, which are less likely to cause myalgias and are very effective. - Labs for kidney function and magnesium ordered.

## 2024-07-01 NOTE — Progress Notes (Unsigned)
 Established Patient Office Visit  Subjective   Patient ID: Kelli [name], female    DOB: 1940-11-04  Age: 83 y.o. MRN: [id number]  Chief Complaint  Patient presents with   Hospitalization Follow-up    HPI Subjective - Upper respiratory symptoms started Monday or Tuesday of this week. Reports throat and upper respiratory congestion. Denies fever, body aches. Thought it might be allergies initially. Denies sore throat, reports it feels congested. Denies productive cough. Still has some shortness of breath and sensation of needing to suck in air which has been present for over a year.  - Follow up after hospitalization for fluid overload. Was seen for labs which were not good, cardiologist was contacted, and was admitted to hospital by the cardiology PA for IV diuresis. Underwent successful cardioversion (one shock) while inpatient. Reports watching fluid status closely. Left foot sometimes feels a little puffy, but much less than before. Denies recent belching, which was present after discharge and resolved with a few days of Pepcid  AC. Reports joint aching, unsure if related to new medication or URI.  Medications Tylenol  as needed, Eliquis  2.5mg  twice daily (decreased from 5mg  twice daily), Lipitor 10mg  (previously trialed 20mg  but not tolerated), Zetia (newly started), lisinopril 25mg  (decreased from 50mg ), metoprolol  (increased dose, taken twice daily), Jardiance  (held for 2 days post-hospitalization, now resumed), biotin supplement, B vitamin, calcium  with D3, Tums chewable as needed (none recently), elderberry, Pepcid  AC (famotidine ) as needed, fish oil, Flonase  (needs to restart), magnesium at night, Prilosec (reports it does not help, will stop), vitamin C , vitamin D . Took Coricidin HBP for cold symptoms.  PMH, PSH, FH, Social Hx PMHx: Atrial fibrillation s/p cardioversion, fluid overload/CHF, hyperlipidemia, hypertension, arthritis. PSH: Inpatient hospitalization for IV  diuresis.  ROS Constitutional: Denies fever. Reports joint aches. Respiratory: Reports upper respiratory congestion, cough, shortness of breath. Denies productive cough. Cardiovascular: Denies significant peripheral edema. GI: Denies belching.    The ASCVD Risk score (Arnett DK, et al., 2019) failed to calculate for the following reasons:   The 2019 ASCVD risk score is only valid for ages 72 to 89   Risk score cannot be calculated because patient has a medical history suggesting prior/existing ASCVD  Health Maintenance Due  Topic Date Due   Zoster Vaccines- Shingrix (1 of 2) 06/23/1960   Pneumococcal Vaccine: 50+ Years (2 of 2 - PCV) 06/12/2010   COVID-19 Vaccine (3 - Moderna risk series) 12/06/2019   DTaP/Tdap/Td (2 - Td or Tdap) 07/15/2021   Influenza Vaccine  04/15/2024      Objective:     BP (!) 149/79   Pulse 75   Ht 5' 3 (1.6 m)   Wt 159 lb 6.4 oz (72.3 kg)   SpO2 98%   BMI 28.24 kg/m  {Vitals History (Optional):23777}  Physical Exam Gen: alert, oriented Cv: rrr, no murmur Pulm: no respiratory distress. No wheezes or crackles Psych: pleasant affect   Results for orders placed or performed in visit on 07/01/24  POC SOFIA 2 FLU + SARS ANTIGEN FIA  Result Value Ref Range   Influenza A, POC Negative Negative   Influenza B, POC Negative Negative   SARS Coronavirus 2 Ag Negative Negative        Assessment & Plan:   Acute on chronic diastolic congestive heart failure (HCC) -     POC SOFIA 2 FLU + SARS ANTIGEN FIA  Stage 3b chronic kidney disease (HCC) -     Comprehensive metabolic panel with GFR -  Magnesium -     Microalbumin / creatinine urine ratio  Viral upper respiratory tract infection Assessment & Plan: - Acute onset of URI symptoms this week including congestion and cough. Denies fever or body aches. Exam is unremarkable. No signs of pneumonia. - COVID/Flu swab performed in office, results pending. - Counseled on supportive care for  symptoms.   Mixed hyperlipidemia Assessment & Plan: - History of statin intolerance (myalgias with Lipitor 20mg ). Currently on Lipitor 10mg . Recently started on Zetia by cardiology and reports new joint aching, though this may be viral. Plan is to consider referral to lipid clinic (Dr. Katharina office) for Repatha injections, which are less likely to cause myalgias and are very effective. - Labs for kidney function and magnesium ordered.   Chronic diastolic congestive heart failure (HCC) Assessment & Plan: - Recent hospitalization for IV diuresis and successful cardioversion for atrial fibrillation. Weight today is 155 lbs. Previous baseline was ~160 lbs, with weight reaching near 170 lbs with fluid gain. Lungs are now clear on exam. - Continue to monitor daily weights. - Follow up with cardiology PA as planned.      No follow-ups on file.    Toribio MARLA Slain, MD

## 2024-07-02 LAB — COMPREHENSIVE METABOLIC PANEL WITH GFR
ALT: 24 IU/L (ref 0–32)
AST: 32 IU/L (ref 0–40)
Albumin: 3.9 g/dL (ref 3.7–4.7)
Alkaline Phosphatase: 89 IU/L (ref 48–129)
BUN/Creatinine Ratio: 20 (ref 12–28)
BUN: 25 mg/dL (ref 8–27)
Bilirubin Total: 0.7 mg/dL (ref 0.0–1.2)
CO2: 23 mmol/L (ref 20–29)
Calcium: 9.1 mg/dL (ref 8.7–10.3)
Chloride: 99 mmol/L (ref 96–106)
Creatinine, Ser: 1.25 mg/dL — ABNORMAL HIGH (ref 0.57–1.00)
Globulin, Total: 3.2 g/dL (ref 1.5–4.5)
Glucose: 71 mg/dL (ref 70–99)
Potassium: 4.6 mmol/L (ref 3.5–5.2)
Sodium: 138 mmol/L (ref 134–144)
Total Protein: 7.1 g/dL (ref 6.0–8.5)
eGFR: 43 mL/min/1.73 — ABNORMAL LOW (ref >59)

## 2024-07-02 LAB — MICROALBUMIN / CREATININE URINE RATIO
Creatinine, Urine: 25.6 mg/dL
Microalb/Creat Ratio: 63 mg/g{creat} — ABNORMAL HIGH (ref 0–29)
Microalbumin, Urine: 16.1 ug/mL

## 2024-07-02 LAB — MAGNESIUM: Magnesium: 1.9 mg/dL (ref 1.6–2.3)

## 2024-07-04 ENCOUNTER — Other Ambulatory Visit (HOSPITAL_COMMUNITY): Payer: Self-pay

## 2024-07-04 ENCOUNTER — Other Ambulatory Visit: Payer: Self-pay

## 2024-07-04 ENCOUNTER — Ambulatory Visit: Payer: Self-pay | Admitting: Family Medicine

## 2024-07-04 ENCOUNTER — Ambulatory Visit: Attending: Physician Assistant | Admitting: Physician Assistant

## 2024-07-04 ENCOUNTER — Telehealth: Payer: Self-pay | Admitting: Pharmacy Technician

## 2024-07-04 VITALS — BP 160/82 | HR 76 | Ht 63.0 in | Wt 154.0 lb

## 2024-07-04 DIAGNOSIS — I2510 Atherosclerotic heart disease of native coronary artery without angina pectoris: Secondary | ICD-10-CM | POA: Diagnosis not present

## 2024-07-04 DIAGNOSIS — I5032 Chronic diastolic (congestive) heart failure: Secondary | ICD-10-CM

## 2024-07-04 DIAGNOSIS — E782 Mixed hyperlipidemia: Secondary | ICD-10-CM

## 2024-07-04 DIAGNOSIS — I10 Essential (primary) hypertension: Secondary | ICD-10-CM

## 2024-07-04 DIAGNOSIS — I5043 Acute on chronic combined systolic (congestive) and diastolic (congestive) heart failure: Secondary | ICD-10-CM | POA: Diagnosis not present

## 2024-07-04 DIAGNOSIS — I480 Paroxysmal atrial fibrillation: Secondary | ICD-10-CM | POA: Diagnosis not present

## 2024-07-04 DIAGNOSIS — Z79899 Other long term (current) drug therapy: Secondary | ICD-10-CM

## 2024-07-04 DIAGNOSIS — N1832 Chronic kidney disease, stage 3b: Secondary | ICD-10-CM

## 2024-07-04 DIAGNOSIS — R0602 Shortness of breath: Secondary | ICD-10-CM

## 2024-07-04 MED ORDER — LEVALBUTEROL TARTRATE 45 MCG/ACT IN AERO: 1.0000 | INHALATION_SPRAY | Freq: Four times a day (QID) | RESPIRATORY_TRACT | 3 refills | Status: AC | PRN

## 2024-07-04 MED ORDER — LOSARTAN POTASSIUM 50 MG PO TABS: 50.0000 mg | ORAL_TABLET | Freq: Every day | ORAL | 1 refills | Status: DC

## 2024-07-04 NOTE — Telephone Encounter (Signed)
 Pharmacy Patient Advocate Encounter   Received notification from Physician's Office that prior authorization for Repatha is required/requested.   Insurance verification completed.   The patient is insured through [employer].   Per test claim: PA required; PA submitted to above mentioned insurance via Latent Key/confirmation #/[id number] [facility] Status is pending

## 2024-07-04 NOTE — Patient Instructions (Addendum)
 Medication Instructions:  INCREASE Losartan  to 50mg  Take 1 tablet once a day ( you can take 2 (two) 25mg  tablets at 1 time to equal a 50mg  dose and use up what you have left at home START Xopenex use inhaler every 6 hours as needed *If you need a refill on your cardiac medications before your next appointment, please call your pharmacy*  Lab Work: 3 MONTHS FASTING LIPIDS If you have labs (blood work) drawn today and your tests are completely normal, you will receive your results only by: MyChart Message (if you have MyChart) OR A paper copy in the mail If you have any lab test that is abnormal or we need to change your treatment, we will call you to review the results.  Testing/Procedures: NONE ORDERED  Follow-Up: At [facility], you and your health needs are our priority.  As part of our continuing mission to provide you with exceptional heart care, our providers are all part of one team.  This team includes your primary Cardiologist (physician) and Advanced Practice Providers or APPs (Physician Assistants and Nurse Practitioners) who all work together to provide you with the care you need, when you need it.  Your next appointment:   2 month(s)  Provider:   Orren Fabry, PA  We recommend signing up for the patient portal called MyChart.  Sign up information is provided on this After Visit Summary.  MyChart is used to connect with patients for Virtual Visits (Telemedicine).  Patients are able to view lab/test results, encounter notes, upcoming appointments, etc.  Non-urgent messages can be sent to your provider as well.   To learn more about what you can do with MyChart, go to [email or web address].   Other Instructions:  You have been referred to PULMONOLOGY (Shortness of Breath) AND PHARM-D (lipids).  Decongestants, commonly used to treat nasal congestion, can interact with AFib and worsen its symptoms.   How Decongestants Can Affect AFib   Increase heart rate:  Decongestants, such as pseudoephedrine and phenylephrine, can stimulate the heart and increase heart rate. This can trigger or worsen AFib episodes.   Raise blood pressure: Decongestants can also raise blood pressure, which can put additional strain on the heart and increase the risk of AFib.   Interfere with anti-arrhythmic medications: Some decongestants can interact with anti-arrhythmic medications used to treat AFib, reducing their effectiveness.   Recommendations for People with AFib and Nasal Congestion  Consult your doctor: Before taking any decongestant, consult your doctor, especially if you have a history of AFib. They can advise you on safe and appropriate decongestant options.   Consider non-medicinal alternatives: Nasal saline sprays, humidifiers, and steam can help relieve nasal congestion without increasing the risk of AFib.   Choose decongestants without pseudoephedrine or phenylephrine: These ingredients are more likely to cause cardiovascular side effects.   Use decongestants cautiously: Limit their use to short periods and avoid exceeding the recommended dosage.  Monitor your heart rate: Pay attention to your heart rate while taking decongestants. If you experience an increase in heart rate or other AFib symptoms, seek medical attention immediately.   Remember, it's important to consult your healthcare provider before taking any medication, including decongestants, if you have AFib. They can help you make informed decisions to protect your heart health.

## 2024-07-04 NOTE — Telephone Encounter (Signed)
 Pharmacy Patient Advocate Encounter  Received notification from [facility] that Prior Authorization for Repatha has been APPROVED from 07/04/24 to 01/02/25. Ran test claim, Copay is $0.00- one month. This test claim was processed through [facility]- copay amounts may vary at other pharmacies due to pharmacy/plan contracts, or as the patient moves through the different stages of their insurance plan.   PA #/Case ID/Reference #: [id number]

## 2024-07-04 NOTE — Progress Notes (Signed)
 Called pt she is agreeable to the referral

## 2024-07-07 ENCOUNTER — Other Ambulatory Visit (HOSPITAL_COMMUNITY)

## 2024-07-13 ENCOUNTER — Ambulatory Visit: Admitting: Family Medicine

## 2024-07-13 ENCOUNTER — Encounter: Payer: Self-pay | Admitting: Family Medicine

## 2024-07-13 DIAGNOSIS — N1831 Chronic kidney disease, stage 3a: Secondary | ICD-10-CM | POA: Diagnosis not present

## 2024-07-13 DIAGNOSIS — I2510 Atherosclerotic heart disease of native coronary artery without angina pectoris: Secondary | ICD-10-CM

## 2024-07-13 DIAGNOSIS — M542 Cervicalgia: Secondary | ICD-10-CM

## 2024-07-13 DIAGNOSIS — Z23 Encounter for immunization: Secondary | ICD-10-CM

## 2024-07-13 NOTE — Patient Instructions (Signed)
 It was nice to see you today,  We addressed the following topics today: - I will message your heart specialist, orren Lucien, to see if we can start you on Repatha before your appointment at the end of November. - I will also message the sports medicine team to ask about a new treatment for your neck pain and will let you know what they say. - Please get your Tdap and pneumonia vaccines at your local pharmacy when you are ready. It is fine to spread them out. - A referral to a kidney specialist (nephrology) has been placed. Their office should call you to schedule an appointment. If you don't hear from them in two weeks, please let me know. - A referral to a lung specialist (pulmonology) has also been made. Their office should also call you to schedule. Please let me know if you do not hear from them. - Continue to monitor your weight daily and notify us  of any increase of more than 3 pounds in one day. - It is fine to explore the kidney health program through your insurance as long as it does not cost you anything. - Continue taking Pepcid  only for flare-ups.  Have a great day,  Rolan Slain, MD

## 2024-07-13 NOTE — Progress Notes (Signed)
 Annual physical  Subjective    Patient ID: Kelli [name], female    DOB: 1941/03/19  Age: 83 y.o. MRN: [id number]  Chief Complaint  Patient presents with   Annual Exam   HPI Kelli [name] is a 83 y.o. old female here  for annual exam.  Subjective - Worsening joint aches, particularly in the back, for the past week. Questions if it is related to weather or cholesterol medication. Reports pain with bending. - Reports some dizzy episodes and pressure in the back of the head/neck.  Medications Current medications include: Zetia (ezetimibe) and Lipitor (atorvastatin ) for hyperlipidemia, Eliquis  (apixaban ) 2.5mg , calcium , vitamin D , losartan  50mg , Jardiance  (empagliflozin ), fish oil, Flonase  (fluticasone ), magnesium, and metoprolol  75mg  twice daily. Uses Pepcid  (famotidine ) as needed for flare-ups. Has not used Levalbuterol inhaler. Previously took naproxen sodium for joint pain but was advised to switch to Tylenol .  PMH, PSH, FH, Social Hx PMHx: Hyperlipidemia, heart failure, atrial fibrillation, CKD stage 3B, hypertension, osteoarthritis. Reports history of shingles 3 times despite prior vaccination. History of naturally fused cervical vertebrae PSH: None mentioned. FHx: Mother with rheumatoid arthritis. 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 Sees a chiropractor for gentle back treatment including ultrasound   ROS Constitutional: No significant weight changes, stable around 150-155 lbs. Cardiovascular: Reports improved shortness of breath. Musculoskeletal: Worsening aching and swelling in joints, especially back, right shoulder, right hip, thumbs, and two fingers on each hand. Reports neck cracking. Pain in back when bending. Stretching the neck provides relief. Neurological: Reports dizzy episodes and pressure in the back of the head/neck.   The ASCVD Risk score (Arnett DK, et al., 2019) failed to calculate for the  following reasons:   The 2019 ASCVD risk score is only valid for ages 45 to 85   Risk score cannot be calculated because patient has a medical history suggesting prior/existing ASCVD  Health Maintenance Due  Topic Date Due   Zoster Vaccines- Shingrix (1 of 2) 06/23/1960   Pneumococcal Vaccine: 50+ Years (2 of 2 - PCV) 06/12/2010   DTaP/Tdap/Td (2 - Td or Tdap) 07/15/2021   COVID-19 Vaccine (4 - 2025-26 season) 05/16/2024   Medicare Annual Wellness (AWV)  09/06/2024      Objective:     BP (!) 142/83   Pulse 82   Temp 97.7 F (36.5 C) (Oral)   Ht 5' 3 (1.6 m)   Wt 152 lb 12 oz (69.3 kg)   SpO2 99%   BMI 27.06 kg/m    Physical Exam Gen: alert, oriented HEENT: perrla, eomi, mmm CV: rrr, no murmur Pulm: lctab. No wheeze or crackles.  GI: soft, nbs.  Nontender to palpation MSK: strength equal b/l. Normal gait Ext: no pedal edema Skin: warm and dry, no rashes Psych: pleasant affect.  Spontaneous speech   No results found for any visits on 07/13/24.      Assessment & Plan:   Encounter for immunization -     Flu vaccine trivalent PF, 6mos and older(Flulaval,Afluria,Fluarix,Fluzone)  Coronary artery disease involving native heart, unspecified vessel or lesion type, unspecified whether angina present Assessment & Plan: - Reports worsening joint aches since starting Zetia and Lipitor after hospitalization. A cardiology appointment is scheduled for 08/10/2024 to discuss switching to Repatha. Prior authorization for Repatha is already approved. - Will message cardiology luetta Fabry) to inquire about starting Repatha sooner. - demonstrated to patient how to use the Repatha injector pen. - Plan is to stop Zetia upon starting  Repatha, then recheck cholesterol in a few months to determine if Lipitor can also be discontinued.   Cervicalgia Assessment & Plan: - Reports pressure in the back of the head and neck, possibly related to cervical arthritis. Has a history of two  naturally fused vertebrae. - Discussed conservative management options including home cervical traction devices (inflatable cushion, C-shaped curve device). - Will message sports medicine to inquire about therapy options for neck pain. - If appropriate, will refer to sports medicine, which may require a cervical spine x-ray first. - May consider a referral to physical therapy for neck-focused exercises.   Stage 3a chronic kidney disease (HCC) Assessment & Plan: - Recent labs from two weeks ago showed improved creatinine. Has proteinuria, which is being managed with Losartan  and Jardiance . - A nephrology referral was placed on 07/04/2024 for further management. - Patient was informed that the nephrology department will call to schedule an appointment.      Return in about 3 months (around 10/13/2024) for HTN, hld.    Toribio MARLA Slain, MD

## 2024-07-17 ENCOUNTER — Encounter: Payer: Self-pay | Admitting: Family Medicine

## 2024-07-17 DIAGNOSIS — I2510 Atherosclerotic heart disease of native coronary artery without angina pectoris: Secondary | ICD-10-CM | POA: Insufficient documentation

## 2024-07-17 DIAGNOSIS — M542 Cervicalgia: Secondary | ICD-10-CM | POA: Insufficient documentation

## 2024-07-17 NOTE — Assessment & Plan Note (Signed)
-   Reports pressure in the back of the head and neck, possibly related to cervical arthritis. Has a history of two naturally fused vertebrae. - Discussed conservative management options including home cervical traction devices (inflatable cushion, C-shaped curve device). - Will message sports medicine to inquire about therapy options for neck pain. - If appropriate, will refer to sports medicine, which may require a cervical spine x-ray first. - May consider a referral to physical therapy for neck-focused exercises.

## 2024-07-17 NOTE — Assessment & Plan Note (Signed)
-   Reports worsening joint aches since starting Zetia and Lipitor after hospitalization. A cardiology appointment is scheduled for 08/10/2024 to discuss switching to Repatha. Prior authorization for Repatha is already approved. - Will message cardiology luetta Fabry) to inquire about starting Repatha sooner. - demonstrated to patient how to use the Repatha injector pen. - Plan is to stop Zetia upon starting Repatha, then recheck cholesterol in a few months to determine if Lipitor can also be discontinued.

## 2024-07-17 NOTE — Assessment & Plan Note (Signed)
-   Recent labs from two weeks ago showed improved creatinine. Has proteinuria, which is being managed with Losartan  and Jardiance . - A nephrology referral was placed on 07/04/2024 for further management. - Patient was informed that the nephrology department will call to schedule an appointment.

## 2024-07-18 ENCOUNTER — Telehealth: Payer: Self-pay | Admitting: Family Medicine

## 2024-07-18 ENCOUNTER — Other Ambulatory Visit: Payer: Self-pay

## 2024-07-18 ENCOUNTER — Other Ambulatory Visit: Payer: Self-pay | Admitting: Family Medicine

## 2024-07-18 MED ORDER — REPATHA SURECLICK 140 MG/ML ~~LOC~~ SOAJ: 140.0000 mg | SUBCUTANEOUS | 2 refills | Status: DC

## 2024-07-18 NOTE — Telephone Encounter (Signed)
 Called pt she is advised of her new medication resent to [facility]

## 2024-07-18 NOTE — Telephone Encounter (Signed)
 Please call the patient and let her know I am sending in the repatha and when she starts it she should stop taking the zetia.  She should let us  know if she has any questions or if she has any side effects or concerns about the repatha.

## 2024-07-18 NOTE — Progress Notes (Signed)
 Please call the patient and let her know I am sending in the repatha and when she starts it she should stop taking the zetia.  She should let us  know if she has any questions or if she has any side effects or concerns about the repatha.

## 2024-07-26 ENCOUNTER — Ambulatory Visit: Admitting: Cardiology

## 2024-08-04 ENCOUNTER — Encounter: Payer: Self-pay | Admitting: Physician Assistant

## 2024-08-10 ENCOUNTER — Ambulatory Visit: Admitting: Pharmacist Clinician (PhC)/ Clinical Pharmacy Specialist

## 2024-08-15 ENCOUNTER — Other Ambulatory Visit

## 2024-08-15 ENCOUNTER — Other Ambulatory Visit (INDEPENDENT_AMBULATORY_CARE_PROVIDER_SITE_OTHER): Admitting: Family Medicine

## 2024-08-15 DIAGNOSIS — R300 Dysuria: Secondary | ICD-10-CM | POA: Diagnosis not present

## 2024-08-15 LAB — POCT URINALYSIS DIP (CLINITEK)
Bilirubin, UA: NEGATIVE
Glucose, UA: >=1000 mg/dL — AB
Ketones, POC UA: NEGATIVE mg/dL
Nitrite, UA: NEGATIVE
POC PROTEIN,UA: NEGATIVE
Spec Grav, UA: 1.02 (ref 1.010–1.025)
Urobilinogen, UA: 0.2 U/dL
pH, UA: 5.5 (ref 5.0–8.0)

## 2024-08-16 ENCOUNTER — Encounter: Payer: Self-pay | Admitting: Family Medicine

## 2024-08-17 LAB — URINE CULTURE

## 2024-08-18 ENCOUNTER — Ambulatory Visit: Payer: Self-pay | Admitting: Family Medicine

## 2024-08-30 NOTE — Progress Notes (Signed)
 New Patient Pulmonology Office Visit   Subjective:  Patient ID: Kelli [name], female    DOB: [birth year]  MRN: [id number]  Referred by: Lucien Orren SAILOR, PA-C  CC: No chief complaint on file.   HPI Kelli [name] is a 83 y.o. female with CAD, CKD stage 3a, pAfib, grade 2 diastolic dysfunction and normal EF, moderate MR, mild-mod TR, new pulm nodule   Shortness of breath  {PULM QUESTIONNAIRES (Optional):33196}  ROS  Allergies: Pollen extract, Amoxicillin, and Augmentin [amoxicillin-pot clavulanate] Current Medications[1] Past Medical History:  Diagnosis Date   Allergy    Phreesia 07/04/2020   Arthritis    Phreesia 07/04/2020   Atrial fibrillation (HCC) 02/2013   paroxsymal; c/b Type 2 NSTEMI; CHADS2-VASc=2 (age, sex - patient had come off anti-HTN meds in past); Eliquis  started;  Echocardiogram 03/10/13: EF 55-60%, grade 1 diastolic dysfunction, mild MR, mild LAE   CKD (chronic kidney disease)    3a   GERD (gastroesophageal reflux disease)    Phreesia 07/04/2020   Hyperlipidemia    Hypertension    Myocardial infarction (HCC)    Phreesia 07/04/2020   NSTEMI (non-ST elevated myocardial infarction) (HCC) 02/2013   demand ischemia in setting of AFib with RVR; ETT-Myoview 03/11/13: No ischemia or scar, EF 70%.   NSTEMI (non-ST elevated myocardial infarction) (HCC) 03/10/2013   2014        PAF (paroxysmal atrial fibrillation) [facility])    Past Surgical History:  Procedure Laterality Date   APPENDECTOMY     CARDIOVERSION N/A 06/20/2024   Procedure: CARDIOVERSION;  Surgeon: Floretta Mallard, MD;  Location: [facility] INVASIVE CV LAB;  Service: Cardiovascular;  Laterality: N/A;   CATARACT EXTRACTION     CATARACT EXTRACTION, BILATERAL     EYE SURGERY     STRABISMUS SURGERY     TONSILLECTOMY     YAG LASER APPLICATION     Family History  Problem Relation Age of Onset   Heart attack Mother 101   Cancer Maternal Aunt        throat   Colon cancer Neg Hx    Esophageal cancer Neg Hx     Stomach cancer Neg Hx    Social History   Socioeconomic History   Marital status: Widowed    Spouse name: Not on file   Number of children: 3   Years of education: Not on file   Highest education level: Bachelor's degree (e.g., BA, AB, BS)  Occupational History   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    Employer: [employer]   Occupation: asst interior and spatial designer  Tobacco Use   Smoking status: Never   Smokeless tobacco: Never  Vaping Use   Vaping status: Never Used  Substance and Sexual Activity   Alcohol use: Not Currently   Drug use: No   Sexual activity: Not Currently    Partners: Male    Comment: widowed  Other Topics Concern   Not on file  Social History Narrative   Not on file   Social Drivers of Health   Tobacco Use: Low Risk (07/13/2024)   Patient History    Smoking Tobacco Use: Never    Smokeless Tobacco Use: Never    Passive Exposure: Not on file  Financial Resource Strain: Low Risk (01/26/2024)   Overall Financial Resource Strain (CARDIA)    Difficulty of Paying Living Expenses: Not hard at all  Food Insecurity: No Food Insecurity (06/21/2024)   Epic    Worried About Radiation Protection Practitioner of Food in the Last Year:  Never true    Ran Out of Food in the Last Year: Never true  Transportation Needs: No Transportation Needs (06/21/2024)   Epic    Lack of Transportation (Medical): No    Lack of Transportation (Non-Medical): No  Physical Activity: Sufficiently Active (01/26/2024)   Exercise Vital Sign    Days of Exercise per Week: 4 days    Minutes of Exercise per Session: 60 min  Stress: No Stress Concern Present (01/26/2024)   [employer] of Occupational Health - Occupational Stress Questionnaire    Feeling of Stress : Not at all  Social Connections: Moderately Integrated (06/17/2024)   Social Connection and Isolation Panel    Frequency of Communication with Friends and Family: More than three times a week    Frequency of Social Gatherings with Friends and  Family: More than three times a week    Attends Religious Services: More than 4 times per year    Active Member of [employer] or Organizations: Yes    Attends Banker Meetings: More than 4 times per year    Marital Status: Widowed  Intimate Partner Violence: Not At Risk (06/21/2024)   Epic    Fear of Current or Ex-Partner: No    Emotionally Abused: No    Physically Abused: No    Sexually Abused: No  Depression (PHQ2-9): Low Risk (07/13/2024)   Depression (PHQ2-9)    PHQ-2 Score: 0  Alcohol Screen: Low Risk (09/07/2023)   Alcohol Screen    Last Alcohol Screening Score (AUDIT): 0  Housing: Low Risk (06/21/2024)   Epic    Unable to Pay for Housing in the Last Year: No    Number of Times Moved in the Last Year: 0    Homeless in the Last Year: No  Utilities: Not At Risk (06/21/2024)   Epic    Threatened with loss of utilities: No  Health Literacy: Adequate Health Literacy (09/07/2023)   B1300 Health Literacy    Frequency of need for help with medical instructions: Never       Objective:  There were no vitals taken for this visit. {Pulm Vitals (Optional):32837}  Physical Exam  Diagnostic Review:  {Labs (Optional):32838}  Echo 05/2024 1. LV EF 50 to 55%. normal function. No WMA. LV diastolic parameters.  2. Right ventricular systolic function is normal. The right ventricular  size is normal. There is mildly elevated pulmonary artery systolic  pressure. The estimated right ventricular systolic pressure is 37.3 mmHg.   3. Left atrial size was mildly dilated.   4. Right atrial size was mildly dilated.   5. The mitral valve is degenerative. Mild to moderate mitral valve  regurgitation. No evidence of mitral stenosis. Severe mitral annular  calcification.   6. Tricuspid valve regurgitation is mild to moderate.   7. The aortic valve is tricuspid. Aortic valve regurgitation is not  visualized. No aortic stenosis is present.   8. The inferior vena cava is dilated in size  with <50% respiratory  variability, suggesting right atrial pressure of 15 mmHg.     CT chest 10/22/23 1. Stable 6 mm peripheral posterior right middle lobe pulmonary nodule. 6 month interval stability is reassuring for benign etiology. Repeat CT in 12-18 months recommended to document continued stability. This recommendation follows the consensus statement: Guidelines for Management of Incidental Pulmonary Nodules  Assessment & Plan:   Assessment & Plan    No follow-ups on file.    Marny Patch, MD Pulmonary and Critical Care Medicine [facility]  Pulmonary Care    [1]  Current Outpatient Medications:    acetaminophen  (TYLENOL ) 500 MG tablet, Take 500 mg by mouth every 6 (six) hours as needed for mild pain (pain score 1-3) or moderate pain (pain score 4-6)., Disp: , Rfl:    apixaban  (ELIQUIS ) 2.5 MG TABS tablet, Take 1 tablet (2.5 mg total) by mouth 2 (two) times daily., Disp: 60 tablet, Rfl: 11   Ascorbic Acid  (VITAMIN C ) 500 MG CHEW, Chew 500 mg by mouth at bedtime., Disp: , Rfl:    atorvastatin  (LIPITOR) 10 MG tablet, Take 1 tablet (10 mg total) by mouth daily. (Patient taking differently: Take 10 mg by mouth at bedtime.), Disp: 90 tablet, Rfl: 3   Biotin 1 MG CAPS, Take 1 tablet by mouth at bedtime., Disp: , Rfl:    Calcium  Carb-Cholecalciferol  (CALCIUM  + D3) 600-200 MG-UNIT TABS, Take 1 tablet by mouth daily., Disp: , Rfl:    calcium  carbonate (TUMS - DOSED IN MG ELEMENTAL CALCIUM ) 500 MG chewable tablet, Chew 1 tablet by mouth as needed for indigestion or heartburn., Disp: , Rfl:    Cholecalciferol  (VITAMIN D ) 400 UNITS capsule, Take 400 Units by mouth as needed (if no calcium  with vitamin d )., Disp: , Rfl:    docusate sodium  (COLACE) 100 MG capsule, Take 100 mg by mouth as needed for mild constipation., Disp: , Rfl:    ELDERBERRY PO, Take 1 each by mouth as needed (flu season, around sickness)., Disp: , Rfl:    empagliflozin  (JARDIANCE ) 10 MG TABS tablet, Take 1  tablet (10 mg total) by mouth daily before breakfast., Disp: 90 tablet, Rfl: 3   Evolocumab  (REPATHA  SURECLICK) 140 MG/ML SOAJ, Inject 140 mg into the skin every 14 (fourteen) days., Disp: 2 mL, Rfl: 2   ezetimibe  (ZETIA ) 10 MG tablet, Take 1 tablet (10 mg total) by mouth daily., Disp: 90 tablet, Rfl: 3   famotidine  (PEPCID ) 20 MG tablet, Take 1 tablet (20 mg total) by mouth daily. (Patient taking differently: Take 20 mg by mouth as needed for heartburn or indigestion (if no prilosec).), Disp: 30 tablet, Rfl: 1   fish oil-omega-3 fatty acids  1000 MG capsule, Take 1 g by mouth daily. w/ flax seed oil, Disp: , Rfl:    fluticasone  (FLONASE ) 50 MCG/ACT nasal spray, Place 2 sprays into both nostrils daily. (Patient taking differently: Place 2 sprays into both nostrils as needed for allergies.), Disp: 16 g, Rfl: 6   levalbuterol  (XOPENEX  HFA) 45 MCG/ACT inhaler, Inhale 1-2 puffs into the lungs every 6 (six) hours as needed for wheezing., Disp: 1 each, Rfl: 3   losartan  (COZAAR ) 50 MG tablet, Take 1 tablet (50 mg total) by mouth daily., Disp: 30 tablet, Rfl: 1   Magnesium 300 MG CAPS, Take 1 capsule by mouth at bedtime., Disp: , Rfl:    metoprolol  tartrate 75 MG TABS, Take 1 tablet (75 mg total) by mouth 2 (two) times daily., Disp: 60 tablet, Rfl: 11   Probiotic Product (PROBIOTIC DAILY PO), Take 1 tablet by mouth at bedtime as needed., Disp: , Rfl:

## 2024-08-31 ENCOUNTER — Ambulatory Visit

## 2024-08-31 VITALS — BP 138/70 | HR 88 | Ht 62.5 in | Wt 155.2 lb

## 2024-08-31 DIAGNOSIS — R911 Solitary pulmonary nodule: Secondary | ICD-10-CM

## 2024-08-31 DIAGNOSIS — R0609 Other forms of dyspnea: Secondary | ICD-10-CM | POA: Diagnosis not present

## 2024-08-31 DIAGNOSIS — I4891 Unspecified atrial fibrillation: Secondary | ICD-10-CM

## 2024-08-31 DIAGNOSIS — I5030 Unspecified diastolic (congestive) heart failure: Secondary | ICD-10-CM

## 2024-08-31 DIAGNOSIS — N189 Chronic kidney disease, unspecified: Secondary | ICD-10-CM

## 2024-09-12 ENCOUNTER — Other Ambulatory Visit: Payer: Self-pay | Admitting: Physician Assistant

## 2024-09-20 NOTE — Progress Notes (Unsigned)
 " Cardiology Office Note   Date:  09/21/2024  ID:  Kelli [name], DOB Jun 24, 1941, MRN [id number] PCP: Chandra Toribio POUR, MD  [facility] Providers Cardiologist:  Peter Jordan, MD Cardiology APP:  Madie Jon Garre, [place]   History of Present Illness Kelli [name] is a 84 y.o. female who with a past medical history of hypertension, paroxysmal atrial fibrillation with RVR who presents today for follow-up appointment.  Was admitted in 2014 and had demand ischemia.  Myoview study was normal.  Echo from [place] 2014 with EF 55 to 60%, grade 1 DD.  Has been on chronic therapy with metoprolol  and Eliquis .  She was seen Summer of 2025 by Jon Madie, PA-C with symptoms of DOE.  Echo showed grade 2 diastolic dysfunction and normal EF.  Moderate MR and mild to moderate TR.  BNP 151.  Coronary CTA showed a high calcium  score of 631 but mild nonobstructive CAD.  There was a questionable left atrial appendage thrombus.  Patient already on Eliquis .  Pulmonary nodule noted with recommendations for follow-up CT at 6 to 12 months.  Mild thickening of esophagus.  She did have repeat CT showing no change in pulmonary nodule, benign.  She did have 1 episode of atrial fibrillation the night prior to her last office visit in July where her heart rate was 122 bpm.  Lasted about an hour and then she converted.  Does note some shortness of breath when walking uphill.  Stated that it was better if she holds her thigh.  Like to have more energy.  No yesterday, she was called regarding her lab work.  Unfortunately she reported shortness of breath and 10 pound weight gain since seeing us  on 9/11.  Stated that mainly her ankles were swollen and puffy.  She just wants to sleep and has no energy.  Also reports tightness under her breast but is not painful.  She was restarted on HCTZ.  She she is set up to see me today to evaluate volume status.  I saw her 10/3, she presents with paroxysmal atrial fibrillation and fluid  overload who presents with worsening shortness of breath and fatigue. She was referred by Dr. Holmes for evaluation of her worsening symptoms.  Over the past week, she experiences worsening shortness of breath and fatigue, particularly when walking. She describes head pressure and a tightening sensation, which she refers to as 'tiredness' and 'heaviness'.  She has atrial fibrillation with heart rate fluctuations between 66 and 134 bpm and episodes of tachycardia noted during an echocardiogram. She experiences 'flutters' even at rest, and her heart rate is irregular. A heart monitor was recently mailed in, but results are pending.  She has fluid overload, with a weight gain of approximately 15 to 20 pounds. Initially, she lost 15 pounds but has since regained the weight. Her ProBNP levels are elevated, and she is on diuretics. Hydrochlorothiazide  was discontinued due to high potassium levels, and she is now prescribed oral Lasix .  Her kidney function has been fluctuating, with creatinine levels ranging from 1.2 to 1.7 over the past months. Her current creatinine is 1.6. Her kidney function is closely monitored due to her fluid status and diuretic use.  Discussed the use of AI scribe software for clinical note transcription with the patient, who gave verbal consent to proceed.  On arrival at the hospital, BNP was elevated at 856.2.  Chest x-ray showed central pulmonary vascular congestion without overt pulmonary edema.  Started on IV diuretics and I's  and O's were down about 8 L since admission.  Transition to oral Lasix .  Developed slight AKI which was suspected from overdiuresis.  Heart failure was likely related to decreased cardiac output from the atrial fibrillation with RVR.  Lasix  was stopped and daily weights were recommended.  Lopressor  was titrated to 75 mg twice daily and the plan was to restart Jardiance  10 mg 2 days after AKI resolves and after DCCV.  She had a CHA2DS2-VASc score of 6 with an  annual stroke risk of 9.7%.  Had atrial fibrillation 10 years ago.  No missed doses of Eliquis .  Underwent successful DCCV and was stable for discharge.  Dose of Eliquis  was changed to 2.5 mg twice daily due to her age and creatinine.  I saw her 10/20, she presents with  a hx of atrial fibrillation and volume overload for cardiovascular follow-up.  She experienced a period of atrial fibrillation in early September, as indicated by her Fitbit, but has not had issues since her last visit. She is on a higher dose of metoprolol . Muscle aches are present, which she attributes to Lipitor and Zetia . Her creatinine level is 1.2, and she has chronic kidney disease. This is a large improvement from a month ago where her creatinine was 1.8. She was previously on hydrochlorothiazide  for blood pressure. Her losartan  dose was lowered after a recent hospital stay, and she is currently on a low dose. Her blood pressure has been slightly elevated. She has not had any fluid reaccumulation and to her knowledge she has not been back in atrial fibrillation.  Reports no shortness of breath nor dyspnea on exertion. Reports no chest pain, pressure, or tightness. No edema, orthopnea, PND. Reports no palpitations.   Discussed the use of AI scribe software for clinical note transcription with the patient, who gave verbal consent to proceed.  Today, she has a hx of coronary artery disease with fatigue and decreased stamina.  She reports progressive fatigue and reduced stamina with activity. She has more difficulty keeping up with others and needs frequent breaks. She notes mild exertional shortness of breath, which is improved compared with before her last hospitalization. Symptoms have led her to cut back work from full-time to part-time.  She has atrial fibrillation but no recent palpitations or documented episodes. Her wearable device has not shown irregular rhythms. She recalls a September echocardiogram with reported  monitoring of heart function and valves and mild to moderate mitral regurgitation.  She has coronary artery disease with a CT calcium  score of 661 from two years ago in the 82nd percentile for age and sex, with mild to moderate coronary plaque and no stenosis over 75 percent. She takes metoprolol  75 mg twice daily.  She notes increased joint and muscle aches and occasionally uses a cane for balance, but she does not feel unsteady.  She has chronic kidney disease with prior concern raised by a urologist but no current need for dialysis. She takes Jardiance  and has had elevated urinary glucose without diabetes. Recent urine testing showed white blood cells and trace blood without bacterial growth.  Reports no shortness of breath nor dyspnea on exertion. Reports no chest pain, pressure, or tightness. No edema, orthopnea, PND. Reports no palpitations.   Discussed the use of AI scribe software for clinical note transcription with the patient, who gave verbal consent to proceed.   ROS: Pertinent ROS in HPI  Studies Reviewed      Echo 03/18/23: IMPRESSIONS     1. Left ventricular  ejection fraction, by estimation, is 60 to 65%. The  left ventricle has normal function. The left ventricle has no regional  wall motion abnormalities. There is mild left ventricular hypertrophy.  Left ventricular diastolic parameters  are consistent with Grade II diastolic dysfunction (pseudonormalization).   2. Right ventricular systolic function is normal. The right ventricular  size is normal. There is normal pulmonary artery systolic pressure.   3. Left atrial size was moderately dilated.   4. The mitral valve is degenerative. Mild to moderate mitral valve  regurgitation.   5. Tricuspid valve regurgitation is mild to moderate.   6. The aortic valve is grossly normal. Aortic valve regurgitation is not  visualized.   7. The inferior vena cava is normal in size with greater than 50%  respiratory variability,  suggesting right atrial pressure of 3 mmHg.    OVER-READ INTERPRETATION  PET-CT CHEST   The following report is an over-read performed by radiologist Dr. Camellia Lang [facility] on 04/25/2023. This over-read does not include interpretation of cardiac or coronary anatomy or pathology. The cardiac CT interpretation by the cardiologist is to be attached.   COMPARISON:  None.   FINDINGS: Mild circumferential wall thickening noted distal esophagus with tiny hiatal hernia evident.   6 mm posterior right middle lobe pulmonary nodule identified on image 18/11.   4 mm low-density lesion in the dome of the liver is too small to characterize but is statistically most likely benign. No followup imaging is recommended.   No suspicious lytic or sclerotic osseous abnormality.   IMPRESSION: 1. 6 mm posterior right middle lobe pulmonary nodule. Non-contrast chest CT at 6-12 months is recommended. If the nodule is stable at time of repeat CT, then future CT at 18-24 months (from today's scan) is considered optional for low-risk patients, but is recommended for high-risk patients. This recommendation follows the consensus statement: Guidelines for Management of Incidental Pulmonary Nodules Detected on CT Images: From the Fleischner Society 2017; Radiology 2017; 284:228-243. 2. Mild circumferential wall thickening distal esophagus with tiny hiatal hernia. Correlation for symptoms of esophagitis recommended.   These results will be called to the ordering clinician or representative by the Radiologist Assistant, and communication documented in the PACS or [employer].     Electronically Signed   By: Camellia Candle M.D.   On: 04/25/2023 07:44   EXAM: Cardiac/Coronary CTA   TECHNIQUE: A non-contrast, gated CT scan was obtained with axial slices of 3 mm through the heart for calcium  scoring. Calcium  scoring was performed using the Agatston method. A 120 kV prospective,  gated, contrast cardiac scan was obtained. Gantry rotation speed was 250 msecs and collimation was 0.6 mm. Two sublingual nitroglycerin  tablets (0.8 mg) were given. The 3D data set was reconstructed in 5% intervals of the 35-75% of the R-R cycle. Diastolic phases were analyzed on a dedicated workstation using MPR, MIP, and VRT modes. The patient received 95 cc of contrast.   FINDINGS: Image quality: Excellent.   Noise artifact is: Limited.   Coronary Arteries:  Normal coronary origin.  Right dominance.   Left main: The left main is a large caliber vessel with a normal take off from the left coronary cusp that bifurcates to form a left anterior descending artery and a left circumflex artery. There is no plaque or stenosis.   Left anterior descending artery: The LAD has minimal (0-24) calcified plaque in the proximal and mid vessel. The LAD gives off 1 small diagonal with mild (25-49) ostial  stenosis and mild (25-49) plaque in the mid vessel.   Left circumflex artery: The LCX is non-dominant and has minimal (0-24) plaque in the proximal vessel and mild (25-49) plaque in the mid vessel. The LCX gives off 2 large, patent obtuse marginal branches.   Right coronary artery: The RCA is dominant with normal take off from the right coronary cusp. There is mild (25-49) plaque in the proximal to mid vessel. The RCA terminates as a PDA and right posterolateral branch without evidence of plaque or stenosis.   Right Atrium: Right atrial size is within normal limits.   Right Ventricle: The right ventricular cavity is within normal limits.   Left Atrium: Left atrial size is enlarged; cannot R/O thrombus at left atrial appendage tip.   Left Ventricle: The ventricular cavity size is within normal limits.   Pulmonary arteries: Normal in size.   Pulmonary veins: Normal pulmonary venous drainage.   Pericardium: Normal thickness without significant effusion or calcium  present.   Cardiac  valves: The aortic valve is trileaflet without significant calcification. The mitral valve is normal; significant mitral annular calcification.   Aorta: Normal caliber with aortic atherosclerosis.   Extra-cardiac findings: See attached radiology report for non-cardiac structures.   IMPRESSION: 1. Coronary calcium  score of 661. This was 23 percentile for age-, sex, and race-matched controls.   2. Total plaque volume 577 mm3 which is 59 percentile for age- and sex-matched controls (calcified plaque 128 mm3; non-calcified plaque 449 mm3). TPV is (severe).   3. Normal coronary origin with right dominance.   4. Mild coronary artery disease in the LAD, diagonal, Lcx and RCA as outlined.   5. Aortic atherosclerosis.   6. LAE; cannot R/O left atrial appendage thrombus.   RECOMMENDATIONS: CAD-RADS 2: Mild non-obstructive CAD (25-49%). Consider non-atherosclerotic causes of chest pain. Consider preventive therapy and risk factor modification.      Physical Exam VS:  BP 124/60   Pulse 70   Ht 5' 3 (1.6 m)   Wt 156 lb (70.8 kg)   SpO2 95%   BMI 27.63 kg/m        Wt Readings from Last 3 Encounters:  09/21/24 156 lb (70.8 kg)  08/31/24 155 lb 3.2 oz (70.4 kg)  07/13/24 152 lb 12 oz (69.3 kg)    GEN: Well nourished, well developed in no acute distress NECK: No JVD; No carotid bruits CARDIAC: IRIR, no murmurs, rubs, gallops RESPIRATORY:  Clear to auscultation without rales, wheezing or rhonchi  ABDOMEN: Soft, non-tender, non-distended EXTREMITIES:  No edema; No deformity   ASSESSMENT AND PLAN  Acute on chronic combined systolic and diastolic heart failure Echocardiogram shows normal systolic function with low normal ejection fraction (50-55%) and mild to moderate mitral regurgitation. Diastolic dysfunction present, contributing to symptoms. No significant valvular issues requiring immediate intervention. - Continue to monitor symptoms and heart function. - PT referral from  PCP  Coronary artery disease Mild to moderate plaque with calcium  score of 661 (82nd percentile). No significant blockages over 75% requiring stenting. Symptoms may relate to disease progression. Discussed CT scan benefits for progression assessment and treatment adjustment. - Ordered coronary CTA scan to assess for progression. - Adjust medications if significant progression is noted. - Consider catheterization if blockages over 75% are identified.  Paroxysmal atrial fibrillation No recent episodes reported. Watch has not shown significant arrhythmias. - Continue monitoring with watch for arrhythmias.  Benign hypertension Current medication includes metoprolol  75 mg twice daily. Discussed potential need for medication adjustment and monitoring of  kidney function and electrolytes. - Checked blood pressure in office. - Recommended new blood pressure cuff if current one is over two years old. - Consider adjusting antihypertensive medications based on blood pressure readings and kidney function.  Mixed hyperlipidemia Currently on Repatha . LDL levels not rechecked since starting Repatha . Plan to wait three months post-initiation for accurate assessment. - Order LDL lipid panel in the next month or two.  Chronic kidney disease Recent urine analysis showed trace blood and glucose, no bacterial growth. Nephrologist consulted, follow-up pending. Discussed potential causes of trace blood, including Eliquis  use. - Follow up with nephrologist regarding urine analysis results. - Monitor kidney function and electrolytes.    Dispo: She can return in 2 months with me  Signed, Orren LOISE Fabry, PA-C   "

## 2024-09-21 ENCOUNTER — Ambulatory Visit: Attending: Cardiovascular Disease | Admitting: Physician Assistant

## 2024-09-21 ENCOUNTER — Encounter: Payer: Self-pay | Admitting: Physician Assistant

## 2024-09-21 VITALS — BP 124/60 | HR 70 | Ht 63.0 in | Wt 156.0 lb

## 2024-09-21 DIAGNOSIS — I5043 Acute on chronic combined systolic (congestive) and diastolic (congestive) heart failure: Secondary | ICD-10-CM | POA: Diagnosis not present

## 2024-09-21 DIAGNOSIS — I480 Paroxysmal atrial fibrillation: Secondary | ICD-10-CM | POA: Diagnosis not present

## 2024-09-21 DIAGNOSIS — I10 Essential (primary) hypertension: Secondary | ICD-10-CM

## 2024-09-21 DIAGNOSIS — Z79899 Other long term (current) drug therapy: Secondary | ICD-10-CM | POA: Diagnosis not present

## 2024-09-21 DIAGNOSIS — E782 Mixed hyperlipidemia: Secondary | ICD-10-CM

## 2024-09-21 DIAGNOSIS — I5032 Chronic diastolic (congestive) heart failure: Secondary | ICD-10-CM

## 2024-09-21 DIAGNOSIS — I2510 Atherosclerotic heart disease of native coronary artery without angina pectoris: Secondary | ICD-10-CM | POA: Diagnosis not present

## 2024-09-21 MED ORDER — METOPROLOL TARTRATE 100 MG PO TABS: ORAL_TABLET | ORAL | 0 refills | Status: AC

## 2024-09-21 NOTE — Patient Instructions (Addendum)
 Medication Instructions:  TAKE A ONE TIME DOSE of Metoprolol  2 hours prior to test  HOLD YOUR MORNING DOSE OF METOPROLOL  75MG  AND RESUME YOUR EVENING DOSE  *If you need a refill on your cardiac medications before your next appointment, please call your pharmacy*  Lab Work: TODAY-BMET If you have labs (blood work) drawn today and your tests are completely normal, you will receive your results only by: MyChart Message (if you have MyChart) OR A paper copy in the mail If you have any lab test that is abnormal or we need to change your treatment, we will call you to review the results.  Testing/Procedures: Coronary CTA Instructions Below  Follow-Up: At [facility], you and your health needs are our priority.  As part of our continuing mission to provide you with exceptional heart care, our providers are all part of one team.  This team includes your primary Cardiologist (physician) and Advanced Practice Providers or APPs (Physician Assistants and Nurse Practitioners) who all work together to provide you with the care you need, when you need it.  Your next appointment:   2 month(s)  Provider:   Peter Jordan, MD or Orren Fabry, [place]   We recommend signing up for the patient portal called MyChart.  Sign up information is provided on this After Visit Summary.  MyChart is used to connect with patients for Virtual Visits (Telemedicine).  Patients are able to view lab/test results, encounter notes, upcoming appointments, etc.  Non-urgent messages can be sent to your provider as well.   To learn more about what you can do with MyChart, go to [email or web address].   Other Instructions          Your cardiac CT will be scheduled at one of the below locations:    [facility] [address]  [place], [place] [address]  If scheduled at the [facility] at [address], please enter the parking lot using the [address] entrance and  use the FREE valet service at the patient drop-off area. Enter the building and check-in with registration on the main floor.   Please follow these instructions carefully (unless otherwise directed):  An IV will be required for this test and Nitroglycerin  will be given.  Hold all erectile dysfunction medications at least 3 days (72 hrs) prior to test. (Ie viagra, cialis, sildenafil, tadalafil, etc)   On the Night Before the Test: Be sure to Drink plenty of water. Do not consume any caffeinated/decaffeinated beverages or chocolate 12 hours prior to your test. Do not take any antihistamines 12 hours prior to your test.  On the Day of the Test: Drink plenty of water until 1 hour prior to the test. Do not eat any food 1 hour prior to test. You may take your regular medications prior to the test.  Take metoprolol  (Lopressor ) two hours prior to test. If you take Furosemide /Hydrochlorothiazide /Spironolactone/Chlorthalidone, please HOLD on the morning of the test. Patients who wear a continuous glucose monitor MUST remove the device prior to scanning. FEMALES- please wear underwire-free bra if available, avoid dresses & tight clothing         After the Test: Drink plenty of water. After receiving IV contrast, you may experience a mild flushed feeling. This is normal. On occasion, you may experience a mild rash up to 24 hours after the test. This is not dangerous. If this occurs, you can take Benadryl 25 mg, Zyrtec, Claritin, or Allegra and increase your fluid  intake. (Patients taking Tikosyn should avoid Benadryl, and may take Zyrtec, Claritin, or Allegra) If you experience trouble breathing, this can be serious. If it is severe call 911 IMMEDIATELY. If it is mild, please call our office.  We will call to schedule your test 2-4 weeks out understanding that some insurance companies will need an authorization prior to the service being performed.   For more information and frequently asked  questions, please visit our website : [email or web address]  For non-scheduling related questions, please contact the cardiac imaging nurse navigator should you have any questions/concerns: Cardiac Imaging Nurse Navigators Direct Office Dial: [phone]   For scheduling needs, including cancellations and rescheduling, please call Brittany, [phone].

## 2024-09-22 ENCOUNTER — Ambulatory Visit (INDEPENDENT_AMBULATORY_CARE_PROVIDER_SITE_OTHER): Payer: Medicare Other

## 2024-09-22 VITALS — Ht 63.0 in | Wt 156.0 lb

## 2024-09-22 DIAGNOSIS — Z0000 Encounter for general adult medical examination without abnormal findings: Secondary | ICD-10-CM

## 2024-09-22 NOTE — Patient Instructions (Addendum)
 Kelli [name],  Thank you for taking the time for your Medicare Wellness Visit. I appreciate your continued commitment to your health goals. Please review the care plan we discussed, and feel free to reach out if I can assist you further.  Please note that Annual Wellness Visits do not include a physical exam. Some assessments may be limited, especially if the visit was conducted virtually. If needed, we may recommend an in-person follow-up with your provider.  Ongoing Care Seeing your primary care provider every 3 to 6 months helps us  monitor your health and provide consistent, personalized care.   Referrals If a referral was made during today's visit and you haven't received any updates within two weeks, please contact the referred provider directly to check on the status.  Recommended Screenings:  Health Maintenance  Topic Date Due   Zoster (Shingles) Vaccine (1 of 2) 06/23/1960   Pneumococcal Vaccine for age over 89 (2 of 2 - PCV) 06/12/2010   DTaP/Tdap/Td vaccine (2 - Td or Tdap) 07/15/2021   COVID-19 Vaccine (4 - 2025-26 season) 05/16/2024   Medicare Annual Wellness Visit  09/22/2025   Flu Shot  Completed   Osteoporosis screening with Bone Density Scan  Completed   Meningitis B Vaccine  Aged Out       09/22/2024    1:35 PM  Advanced Directives  Does Patient Have a Medical Advance Directive? Yes  Type of Estate Agent of [place];Living will  Copy of Healthcare Power of Attorney in Chart? No - copy requested    Vision: Annual vision screenings are recommended for early detection of glaucoma, cataracts, and diabetic retinopathy. These exams can also reveal signs of chronic conditions such as diabetes and high blood pressure.  Dental: Annual dental screenings help detect early signs of oral cancer, gum disease, and other conditions linked to overall health, including heart disease and diabetes.

## 2024-09-22 NOTE — Progress Notes (Signed)
 "  Chief Complaint  Patient presents with   Medicare Wellness     Subjective:   Kelli [name] is a 84 y.o. female who presents for a Medicare Annual Wellness Visit.  Visit info / Clinical Intake: Medicare Wellness Visit Type:: Subsequent Annual Wellness Visit Persons participating in visit and providing information:: patient Medicare Wellness Visit Mode:: Video Since this visit was completed virtually, some vitals may be partially provided or unavailable. Missing vitals are due to the limitations of the virtual format.: Documented vitals are patient reported If Telephone or Video please confirm:: I connected with patient using audio/video enable telemedicine. I verified patient identity with two identifiers, discussed telehealth limitations, and patient agreed to proceed. Patient Location:: Home Provider Location:: Office Interpreter Needed?: No Pre-visit prep was completed: yes AWV questionnaire completed by patient prior to visit?: no Living arrangements:: (!) lives alone Patient's Overall Health Status Rating: good Typical amount of pain: none Does pain affect daily life?: no Are you currently prescribed opioids?: no  Dietary Habits and Nutritional Risks How many meals a day?: 3 Eats fruit and vegetables daily?: yes Most meals are obtained by: preparing own meals; eating out In the last 2 weeks, have you had any of the following?: none Diabetic:: no  Functional Status Activities of Daily Living (to include ambulation/medication): Independent Ambulation: Independent with device- listed below Home Assistive Devices/Equipment: Eyeglasses; Cane Medication Administration: Independent Home Management (perform basic housework or laundry): Independent Manage your own finances?: yes Primary transportation is: driving Concerns about vision?: no *vision screening is required for WTM* Concerns about hearing?: no  Fall Screening Falls in the past year?: 0 Number of falls in past  year: 0 Was there an injury with Fall?: 0 Fall Risk Category Calculator: 0 Patient Fall Risk Level: Low Fall Risk  Fall Risk Patient at Risk for Falls Due to: No Fall Risks Fall risk Follow up: Falls evaluation completed; Falls prevention discussed  Home and Transportation Safety: All rugs have non-skid backing?: N/A, no rugs All stairs or steps have railings?: N/A, no stairs Grab bars in the bathtub or shower?: yes Have non-skid surface in bathtub or shower?: yes Good home lighting?: yes Regular seat belt use?: yes Hospital stays in the last year:: (!) yes How many hospital stays:: 4 Reason: A.fib  Cognitive Assessment Difficulty concentrating, remembering, or making decisions? : no Will 6CIT or Mini Cog be Completed: yes What year is it?: 0 points What month is it?: 0 points Give patient an address phrase to remember (5 components): [address] [place] About what time is it?: 0 points Count backwards from 20 to 1: 0 points Say the months of the year in reverse: 0 points Repeat the address phrase from earlier: 0 points 6 CIT Score: 0 points  Advance Directives (For Healthcare) Does Patient Have a Medical Advance Directive?: Yes Does patient want to make changes to medical advance directive?: No - Patient declined Type of Advance Directive: Healthcare Power of [place]; Living will Copy of Healthcare Power of Attorney in Chart?: No - copy requested Copy of Living Will in Chart?: No - copy requested  Reviewed/Updated  Reviewed/Updated: Reviewed All (Medical, Surgical, Family, Medications, Allergies, Care Teams, Patient Goals)    Allergies (verified) Pollen extract, Amoxicillin, and Augmentin [amoxicillin-pot clavulanate]   Current Medications (verified) Outpatient Encounter Medications as of 09/22/2024  Medication Sig   acetaminophen  (TYLENOL ) 500 MG tablet Take 500 mg by mouth every 6 (six) hours as needed for mild pain (pain score 1-3) or  moderate pain (pain  score 4-6).   apixaban  (ELIQUIS ) 2.5 MG TABS tablet Take 1 tablet (2.5 mg total) by mouth 2 (two) times daily.   Ascorbic Acid  (VITAMIN C ) 500 MG CHEW Chew 500 mg by mouth at bedtime.   atorvastatin  (LIPITOR) 10 MG tablet Take 1 tablet (10 mg total) by mouth daily.   Biotin 1 MG CAPS Take 1 tablet by mouth at bedtime.   Calcium  Carb-Cholecalciferol  (CALCIUM  + D3) 600-200 MG-UNIT TABS Take 1 tablet by mouth daily.   calcium  carbonate (TUMS - DOSED IN MG ELEMENTAL CALCIUM ) 500 MG chewable tablet Chew 1 tablet by mouth as needed for indigestion or heartburn.   Cholecalciferol  (VITAMIN D ) 400 UNITS capsule Take 400 Units by mouth as needed (if no calcium  with vitamin d ).   docusate sodium  (COLACE) 100 MG capsule Take 100 mg by mouth as needed for mild constipation.   ELDERBERRY PO Take 1 each by mouth as needed (flu season, around sickness).   empagliflozin  (JARDIANCE ) 10 MG TABS tablet Take 1 tablet (10 mg total) by mouth daily before breakfast.   Evolocumab  (REPATHA  SURECLICK) 140 MG/ML SOAJ Inject 140 mg into the skin every 14 (fourteen) days.   fish oil-omega-3 fatty acids  1000 MG capsule Take 1 g by mouth daily. w/ flax seed oil   fluticasone  (FLONASE ) 50 MCG/ACT nasal spray Place 2 sprays into both nostrils daily. (Patient taking differently: Place 2 sprays into both nostrils daily. Takes as needed)   levalbuterol  (XOPENEX  HFA) 45 MCG/ACT inhaler Inhale 1-2 puffs into the lungs every 6 (six) hours as needed for wheezing.   losartan  (COZAAR ) 50 MG tablet Take 1 tablet (50 mg total) by mouth daily. Please keep scheduled appointment for future refills. Thank you.   Magnesium 300 MG CAPS Take 1 capsule by mouth at bedtime.   metoprolol  tartrate (LOPRESSOR ) 100 MG tablet Take 1 tablet 2 hour prior to test   metoprolol  tartrate 75 MG TABS Take 1 tablet (75 mg total) by mouth 2 (two) times daily.   Probiotic Product (PROBIOTIC DAILY PO) Take 1 tablet by mouth at bedtime as needed.   famotidine   (PEPCID ) 20 MG tablet Take 1 tablet (20 mg total) by mouth daily. (Patient not taking: Reported on 09/21/2024)   No facility-administered encounter medications on file as of 09/22/2024.    History: Past Medical History:  Diagnosis Date   Allergy    Phreesia 07/04/2020   Arthritis    Phreesia 07/04/2020   Atrial fibrillation (HCC) 02/2013   paroxsymal; c/b Type 2 NSTEMI; CHADS2-VASc=2 (age, sex - patient had come off anti-HTN meds in past); Eliquis  started;  Echocardiogram 03/10/13: EF 55-60%, grade 1 diastolic dysfunction, mild MR, mild LAE   CKD (chronic kidney disease)    3a   GERD (gastroesophageal reflux disease)    Phreesia 07/04/2020   Hyperlipidemia    Hypertension    Myocardial infarction (HCC)    Phreesia 07/04/2020   NSTEMI (non-ST elevated myocardial infarction) (HCC) 02/2013   demand ischemia in setting of AFib with RVR; ETT-Myoview 03/11/13: No ischemia or scar, EF 70%.   NSTEMI (non-ST elevated myocardial infarction) (HCC) 03/10/2013   2014        PAF (paroxysmal atrial fibrillation) [facility])    Past Surgical History:  Procedure Laterality Date   APPENDECTOMY     CARDIOVERSION N/A 06/20/2024   Procedure: CARDIOVERSION;  Surgeon: Floretta Mallard, MD;  Location: [facility] INVASIVE CV LAB;  Service: Cardiovascular;  Laterality: N/A;   CATARACT EXTRACTION  CATARACT EXTRACTION, BILATERAL     EYE SURGERY     STRABISMUS SURGERY     TONSILLECTOMY     YAG LASER APPLICATION     Family History  Problem Relation Age of Onset   Heart attack Mother 78   Cancer Maternal Aunt        throat   Colon cancer Neg Hx    Esophageal cancer Neg Hx    Stomach cancer Neg Hx    Social History   Occupational History   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    Employer: [employer]   Occupation: asst interior and spatial designer  Tobacco Use   Smoking status: Never   Smokeless tobacco: Never  Vaping Use   Vaping status: Never Used  Substance and Sexual Activity   Alcohol use: Not  Currently   Drug use: No   Sexual activity: Not Currently    Partners: Male    Comment: widowed   Tobacco Counseling Counseling given: No  SDOH Screenings   Food Insecurity: No Food Insecurity (09/22/2024)  Housing: Unknown (09/22/2024)  Transportation Needs: No Transportation Needs (09/22/2024)  Utilities: Not At Risk (09/22/2024)  Alcohol Screen: Low Risk (09/07/2023)  Depression (PHQ2-9): Low Risk (09/22/2024)  Financial Resource Strain: Low Risk (01/26/2024)  Physical Activity: Sufficiently Active (09/22/2024)  Social Connections: Moderately Integrated (09/22/2024)  Stress: No Stress Concern Present (09/22/2024)  Tobacco Use: Low Risk (09/22/2024)  Health Literacy: Adequate Health Literacy (09/22/2024)   See flowsheets for full screening details  Depression Screen PHQ 2 & 9 Depression Scale- Over the past 2 weeks, how often have you been bothered by any of the following problems? Little interest or pleasure in doing things: 0 Feeling down, depressed, or hopeless (PHQ Adolescent also includes...irritable): 0 PHQ-2 Total Score: 0 Trouble falling or staying asleep, or sleeping too much: 0 Feeling tired or having little energy: 0 Poor appetite or overeating (PHQ Adolescent also includes...weight loss): 0 Feeling bad about yourself - or that you are a failure or have let yourself or your family down: 0 Trouble concentrating on things, such as reading the newspaper or watching television (PHQ Adolescent also includes...like school work): 0 Moving or speaking so slowly that other people could have noticed. Or the opposite - being so fidgety or restless that you have been moving around a lot more than usual: 0 Thoughts that you would be better off dead, or of hurting yourself in some way: 0 PHQ-9 Total Score: 0 If you checked off any problems, how difficult have these problems made it for you to do your work, take care of things at home, or get along with other people?: Not difficult at  all  Depression Treatment Depression Interventions/Treatment : EYV7-0 Score <4 Follow-up Not Indicated     Goals Addressed               This Visit's Progress     Patient Stated (pt-stated)        Patient stated she plans to continue exercising and staying active.  She still works.             Objective:    Today's Vitals   09/22/24 1332  Weight: 156 lb (70.8 kg)  Height: 5' 3 (1.6 m)   Body mass index is 27.63 kg/m.  Hearing/Vision screen Hearing Screening - Comments:: Denies hearing difficulties   Vision Screening - Comments:: Wears rx glasses - up to date with routine eye exams with [facility] Immunizations and Health Maintenance Health Maintenance  Topic Date Due   Pneumococcal Vaccine: 50+ Years (2 of 2 - PCV) 06/12/2010   DTaP/Tdap/Td (2 - Td or Tdap) 07/15/2021   COVID-19 Vaccine (4 - 2025-26 season) 05/16/2024   Zoster Vaccines- Shingrix (1 of 2) 12/21/2024 (Originally 06/23/1960)   Medicare Annual Wellness (AWV)  09/22/2025   Influenza Vaccine  Completed   Bone Density Scan  Completed   Meningococcal B Vaccine  Aged Out        Assessment/Plan:  This is a routine wellness examination for Kelli [name].  I have recommended that this patient have a immunization for Shingles but she declines at this time. I have discussed the risks and benefits of this procedure with her. The patient verbalizes understanding.   Patient Care Team: Chandra Toribio POUR, MD as PCP - General (Family Medicine) Jordan, Peter M, MD as PCP - Cardiology (Cardiology) Jordan, Peter M, MD as Consulting Physician (Cardiology) Shona Rush, MD as Consulting Physician (Dermatology) Lennard Lesta FALCON, MD as Consulting Physician (Gastroenterology) Care, [facility], Jon Garre, [place] as Physician Assistant (Cardiology) Cleotilde Sewer, OD (Optometry)  I have personally reviewed and noted the following in the patients chart:   Medical and social history Use of alcohol, tobacco or  illicit drugs  Current medications and supplements including opioid prescriptions. Functional ability and status Nutritional status Physical activity Advanced directives List of other physicians Hospitalizations, surgeries, and ER visits in previous 12 months Vitals Screenings to include cognitive, depression, and falls Referrals and appointments  No orders of the defined types were placed in this encounter.  In addition, I have reviewed and discussed with patient certain preventive protocols, quality metrics, and best practice recommendations. A written personalized care plan for preventive services as well as general preventive health recommendations were provided to patient.   Kelli [name], CMA   09/22/2024   Return in 1 year (on 09/22/2025).  After Visit Summary: (MyChart) Due to this being a telephonic visit, the after visit summary with patients personalized plan was offered to patient via MyChart   Nurse Notes: scheduled 2027 AWV appt "

## 2024-09-30 ENCOUNTER — Other Ambulatory Visit: Payer: Self-pay

## 2024-10-05 ENCOUNTER — Ambulatory Visit (INDEPENDENT_AMBULATORY_CARE_PROVIDER_SITE_OTHER): Admitting: Family Medicine

## 2024-10-05 ENCOUNTER — Encounter: Payer: Self-pay | Admitting: Family Medicine

## 2024-10-05 VITALS — BP 145/83 | HR 63 | Ht 63.0 in | Wt 155.0 lb

## 2024-10-05 DIAGNOSIS — R5383 Other fatigue: Secondary | ICD-10-CM

## 2024-10-05 DIAGNOSIS — M545 Low back pain, unspecified: Secondary | ICD-10-CM | POA: Diagnosis not present

## 2024-10-05 DIAGNOSIS — R0609 Other forms of dyspnea: Secondary | ICD-10-CM | POA: Diagnosis not present

## 2024-10-05 DIAGNOSIS — M542 Cervicalgia: Secondary | ICD-10-CM | POA: Diagnosis not present

## 2024-10-05 DIAGNOSIS — M6281 Muscle weakness (generalized): Secondary | ICD-10-CM

## 2024-10-05 DIAGNOSIS — N1831 Chronic kidney disease, stage 3a: Secondary | ICD-10-CM | POA: Diagnosis not present

## 2024-10-05 DIAGNOSIS — I480 Paroxysmal atrial fibrillation: Secondary | ICD-10-CM | POA: Diagnosis not present

## 2024-10-05 DIAGNOSIS — I2510 Atherosclerotic heart disease of native coronary artery without angina pectoris: Secondary | ICD-10-CM

## 2024-10-05 DIAGNOSIS — I10 Essential (primary) hypertension: Secondary | ICD-10-CM

## 2024-10-05 NOTE — Patient Instructions (Addendum)
 It was nice to see you today,  We addressed the following topics today: - I have sent in the referral to the physical therapist on [address].  Someone will call to schedule this.  - I am ordering some labs.  I will let you know the results when I get them - I am ordering a mri of your neck to evaluate your neck/head pressure feelings.    Have a great day,  Rolan Slain, MD

## 2024-10-05 NOTE — Progress Notes (Signed)
 "   Subjective   Patient ID: Kelli [name], female    DOB: 16-Aug-1941  Age: 84 y.o. MRN: [id number]  Chief Complaint  Patient presents with   Medical Management of Chronic Issues     History of Present Illness   Kelli [name] is an 84 year old female with cardiovascular issues who presents with fatigue and weakness.  She recently started Repatha  after cardiology visit and stopped Zetia . She continues atorvastatin  10 mg but is reluctant to increase due to prior side effects. After a hospital stay in October, her Eliquis  was reduced from 5 mg to 2.5 mg twice daily and metoprolol  was increased from 50 mg to 75 mg twice daily. Since these changes she feels weak and fatigued but denies muscle aches.  She has chronic joint and back pain she attributes to arthritis. Back pain is central, worsened by movement, and does not radiate down her legs. She moves and bends cautiously because of the pain.  She continues to have neck pressure and headaches that she feels affect her balance and strength. She previously had a brain MRI. She has two fused cervical discs and thinks this may contribute to her symptoms.  In December travel to [place] , she struggled to keep up due to fatigue and often needed taxis. She is worried about managing a long flight and activities for a planned trip to [place]  in February.  Her mother had polymyalgia rheumatica and rheumatoid arthritis. She was told in the past she might have fibromyalgia. She notices muscle and shoulder discomfort and at times feels inflamed.  She stopped ibuprofen and abemaciclib sodium after kidney failure and now tries to limit Tylenol . She has been taking losartan  long term.          The ASCVD Risk score (Arnett DK, et al., 2019) failed to calculate for the following reasons:   The 2019 ASCVD risk score is only valid for ages 105 to 58   Risk score cannot be calculated because patient has a medical history suggesting prior/existing ASCVD   * -  Cholesterol units were assumed  Health Maintenance Due  Topic Date Due   Pneumococcal Vaccine: 50+ Years (2 of 2 - PCV) 06/12/2010   DTaP/Tdap/Td (2 - Td or Tdap) 07/15/2021   COVID-19 Vaccine (4 - 2025-26 season) 05/16/2024      Objective:     BP (!) 145/83   Pulse 63   Ht 5' 3 (1.6 m)   Wt 155 lb (70.3 kg)   SpO2 99%   BMI 27.46 kg/m    Physical Exam     Gen: alert, oriented Cv: rrr Pulm: no respiratory distress Psych: pleasant affect       No results found for any visits on 10/05/24.      Assessment & Plan:   Cervicalgia Assessment & Plan: Chronic cervical spondylosis with disc disease causing neck pressure and weakness. Possible contribution from cervical spine issues or arthritis. - Ordered MRI of the cervical spine to assess for impingement or narrowing of the canal. - Referred to physical therapy for neck and back evaluation and strengthening exercises. - Consider referral to pain management for potential nerve blocks or steroid injections if symptoms persist.  Orders: -     MR CERVICAL SPINE WO CONTRAST; Future  Other fatigue -     ANA w/Reflex; Future -     Basic metabolic panel with GFR; Future -     Rheumatoid factor; Future -     Sedimentation  rate; Future -     C-reactive protein; Future -     Ambulatory referral to Physical Therapy -     CYCLIC CITRUL PEPTIDE ANTIBODY, IGG/IGA; Future  Muscle weakness -     ANA w/Reflex; Future -     Basic metabolic panel with GFR; Future -     Rheumatoid factor; Future -     Sedimentation rate; Future -     C-reactive protein; Future -     Ambulatory referral to Physical Therapy -     CYCLIC CITRUL PEPTIDE ANTIBODY, IGG/IGA; Future  Acute midline low back pain without sciatica Assessment & Plan: Chronic low back pain, possibly due to muscle strain. No sciatica indicated. - Referred to physical therapy for evaluation and management of low back pain.  Orders: -     Ambulatory referral to Physical  Therapy  Dyspnea on exertion Assessment & Plan: Chronic fatigue and weakness with differential diagnosis including medication side effects, autoimmune disorders, or other underlying conditions. - Ordered blood tests including ANA and other autoimmune markers to evaluate for underlying causes of fatigue and weakness. - Continue current medications until follow-up with cardiologist.   PAF (paroxysmal atrial fibrillation) (HCC) Assessment & Plan: Chronic atrial fibrillation managed with Eliquis  and metoprolol . Recent adjustment of metoprolol  dosage post-hospitalization. - Continue current dosage of Eliquis  and metoprolol .     Essential hypertension Assessment & Plan: Chronic hypertension managed with losartan  and metoprolol . - Continue current antihypertensive regimen.   Coronary artery disease involving native heart, unspecified vessel or lesion type, unspecified whether angina present Assessment & Plan: Managed with atovrastatin and recently repatha . Zetia  discontinued - Continue Repatha  and monitor lipid levels. - Follow up with cardiologist for potential discontinuation of atorvastatin  depnding on response to repatha .    Stage 3a chronic kidney disease (HCC) Assessment & Plan: Stable Cr. Avoidance of NSAIDs due to kidney function concerns. - Continue to avoid NSAIDs and monitor kidney function.              Return in about 3 months (around 01/03/2025) for f/u fatigue, neck.    Toribio MARLA Slain, MD  "

## 2024-10-07 ENCOUNTER — Other Ambulatory Visit

## 2024-10-07 DIAGNOSIS — M6281 Muscle weakness (generalized): Secondary | ICD-10-CM

## 2024-10-07 DIAGNOSIS — R5383 Other fatigue: Secondary | ICD-10-CM

## 2024-10-08 DIAGNOSIS — M545 Low back pain, unspecified: Secondary | ICD-10-CM | POA: Insufficient documentation

## 2024-10-08 NOTE — Assessment & Plan Note (Signed)
 Chronic hypertension managed with losartan  and metoprolol . - Continue current antihypertensive regimen.

## 2024-10-08 NOTE — Assessment & Plan Note (Signed)
 Stable Cr. Avoidance of NSAIDs due to kidney function concerns. - Continue to avoid NSAIDs and monitor kidney function.

## 2024-10-08 NOTE — Assessment & Plan Note (Addendum)
 Managed with atovrastatin and recently repatha . Zetia  discontinued - Continue Repatha  and monitor lipid levels. - Follow up with cardiologist for potential discontinuation of atorvastatin  depnding on response to repatha .

## 2024-10-08 NOTE — Assessment & Plan Note (Signed)
 Chronic atrial fibrillation managed with Eliquis  and metoprolol . Recent adjustment of metoprolol  dosage post-hospitalization. - Continue current dosage of Eliquis  and metoprolol .

## 2024-10-08 NOTE — Assessment & Plan Note (Signed)
 Chronic fatigue and weakness with differential diagnosis including medication side effects, autoimmune disorders, or other underlying conditions. - Ordered blood tests including ANA and other autoimmune markers to evaluate for underlying causes of fatigue and weakness. - Continue current medications until follow-up with cardiologist.

## 2024-10-08 NOTE — Assessment & Plan Note (Signed)
 Chronic cervical spondylosis with disc disease causing neck pressure and weakness. Possible contribution from cervical spine issues or arthritis. - Ordered MRI of the cervical spine to assess for impingement or narrowing of the canal. - Referred to physical therapy for neck and back evaluation and strengthening exercises. - Consider referral to pain management for potential nerve blocks or steroid injections if symptoms persist.

## 2024-10-08 NOTE — Assessment & Plan Note (Signed)
 Chronic low back pain, possibly due to muscle strain. No sciatica indicated. - Referred to physical therapy for evaluation and management of low back pain.

## 2024-10-10 ENCOUNTER — Ambulatory Visit: Payer: Self-pay | Admitting: Family Medicine

## 2024-10-10 DIAGNOSIS — E875 Hyperkalemia: Secondary | ICD-10-CM

## 2024-10-11 ENCOUNTER — Other Ambulatory Visit: Payer: Self-pay | Admitting: Physician Assistant

## 2024-10-12 ENCOUNTER — Ambulatory Visit: Admitting: Podiatry

## 2024-10-12 DIAGNOSIS — Q828 Other specified congenital malformations of skin: Secondary | ICD-10-CM | POA: Diagnosis not present

## 2024-10-12 LAB — SEDIMENTATION RATE: Sed Rate: 18 mm/h (ref 0–40)

## 2024-10-12 LAB — RHEUMATOID FACTOR: Rheumatoid fact SerPl-aCnc: <10 [IU]/mL

## 2024-10-12 LAB — ANA W/REFLEX

## 2024-10-12 LAB — BASIC METABOLIC PANEL WITH GFR
BUN/Creatinine Ratio: 27 (ref 12–28)
BUN: 29 mg/dL — ABNORMAL HIGH (ref 8–27)
CO2: 24 mmol/L (ref 20–29)
Calcium: 9 mg/dL (ref 8.7–10.3)
Chloride: 104 mmol/L (ref 96–106)
Creatinine, Ser: 1.09 mg/dL — ABNORMAL HIGH (ref 0.57–1.00)
Glucose: 89 mg/dL (ref 70–99)
Potassium: 5.8 mmol/L — ABNORMAL HIGH (ref 3.5–5.2)
Sodium: 140 mmol/L (ref 134–144)
eGFR: 50 mL/min/{1.73_m2} — ABNORMAL LOW

## 2024-10-12 LAB — CYCLIC CITRUL PEPTIDE ANTIBODY, IGG/IGA: Cyclic Citrullin Peptide Ab: 22 U — AB (ref 0–19)

## 2024-10-12 LAB — C-REACTIVE PROTEIN: CRP: 3 mg/L (ref 0–10)

## 2024-10-12 NOTE — Progress Notes (Signed)
 "  Subjective:  Patient ID: Kelli [name], female    DOB: 25-Jun-1941,  MRN: [id number]  Chief Complaint  Patient presents with   Foot Pain    Pt stated that she has pain on the outside of her left foot     84 y.o. female presents with the above complaint.  Patient presents with right submetatarsal 5 porokeratotic lesion painful to touch is progressive gotten worse worse with ambulation worse with pressure patient would like for me to debride down she denies any other acute joints complaint.  She states that hurts with hard surface.  Has not seen and was prior to seeing me.   Review of Systems: Negative except as noted in the HPI. Denies N/V/F/Ch.  Past Medical History:  Diagnosis Date   Allergy    [employer] 07/04/2020   Arthritis    [facility] 07/04/2020   Atrial fibrillation (HCC) 02/2013   paroxsymal; c/b Type 2 NSTEMI; CHADS2-VASc=2 (age, sex - patient had come off anti-HTN meds in past); Eliquis  started;  Echocardiogram 03/10/13: EF 55-60%, grade 1 diastolic dysfunction, mild MR, mild LAE   CKD (chronic kidney disease)    3a   GERD (gastroesophageal reflux disease)    [facility] 07/04/2020   Hyperlipidemia    Hypertension    Myocardial infarction (HCC)    [facility] 07/04/2020   NSTEMI (non-ST elevated myocardial infarction) (HCC) 02/2013   demand ischemia in setting of AFib with RVR; ETT-Myoview 03/11/13: No ischemia or scar, EF 70%.   NSTEMI (non-ST elevated myocardial infarction) (HCC) 03/10/2013   2014        PAF (paroxysmal atrial fibrillation) (HCC)     Current Outpatient Medications:    acetaminophen  (TYLENOL ) 500 MG tablet, Take 500 mg by mouth every 6 (six) hours as needed for mild pain (pain score 1-3) or moderate pain (pain score 4-6)., Disp: , Rfl:    apixaban  (ELIQUIS ) 2.5 MG TABS tablet, Take 1 tablet (2.5 mg total) by mouth 2 (two) times daily., Disp: 60 tablet, Rfl: 11   Ascorbic Acid  (VITAMIN C ) 500 MG CHEW, Chew 500 mg by mouth at bedtime., Disp: , Rfl:     atorvastatin  (LIPITOR) 10 MG tablet, Take 1 tablet (10 mg total) by mouth daily., Disp: 90 tablet, Rfl: 3   Biotin 1 MG CAPS, Take 1 tablet by mouth at bedtime., Disp: , Rfl:    Calcium  Carb-Cholecalciferol  (CALCIUM  + D3) 600-200 MG-UNIT TABS, Take 1 tablet by mouth daily., Disp: , Rfl:    calcium  carbonate (TUMS - DOSED IN MG ELEMENTAL CALCIUM ) 500 MG chewable tablet, Chew 1 tablet by mouth as needed for indigestion or heartburn., Disp: , Rfl:    Cholecalciferol  (VITAMIN D ) 400 UNITS capsule, Take 400 Units by mouth as needed (if no calcium  with vitamin d )., Disp: , Rfl:    docusate sodium  (COLACE) 100 MG capsule, Take 100 mg by mouth as needed for mild constipation., Disp: , Rfl:    ELDERBERRY PO, Take 1 each by mouth as needed (flu season, around sickness)., Disp: , Rfl:    empagliflozin  (JARDIANCE ) 10 MG TABS tablet, Take 1 tablet (10 mg total) by mouth daily before breakfast., Disp: 90 tablet, Rfl: 3   famotidine  (PEPCID ) 20 MG tablet, Take 1 tablet (20 mg total) by mouth daily. (Patient not taking: Reported on 10/05/2024), Disp: 30 tablet, Rfl: 1   fish oil-omega-3 fatty acids  1000 MG capsule, Take 1 g by mouth daily. w/ flax seed oil, Disp: , Rfl:    fluticasone  (FLONASE ) 50  MCG/ACT nasal spray, Place 2 sprays into both nostrils daily. (Patient taking differently: Place 2 sprays into both nostrils daily. Takes as needed), Disp: 16 g, Rfl: 6   levalbuterol  (XOPENEX  HFA) 45 MCG/ACT inhaler, Inhale 1-2 puffs into the lungs every 6 (six) hours as needed for wheezing., Disp: 1 each, Rfl: 3   losartan  (COZAAR ) 50 MG tablet, Take 1 tablet (50 mg total) by mouth daily., Disp: 30 tablet, Rfl: 8   Magnesium 300 MG CAPS, Take 1 capsule by mouth at bedtime., Disp: , Rfl:    metoprolol  tartrate (LOPRESSOR ) 100 MG tablet, Take 1 tablet 2 hour prior to test, Disp: 1 tablet, Rfl: 0   metoprolol  tartrate 75 MG TABS, Take 1 tablet (75 mg total) by mouth 2 (two) times daily., Disp: 60 tablet, Rfl: 11   Probiotic  Product (PROBIOTIC DAILY PO), Take 1 tablet by mouth at bedtime as needed., Disp: , Rfl:    REPATHA  SURECLICK 140 MG/ML SOAJ, INJECT 140 MG INTO THE SKIN EVERY 14 DAYS., Disp: 2 mL, Rfl: 2  Social History   Tobacco Use  Smoking Status Never  Smokeless Tobacco Never    Allergies  Allergen Reactions   Pollen Extract Other (See Comments)   Amoxicillin Rash   Augmentin [Amoxicillin-Pot Clavulanate] Itching and Rash   Objective:  There were no vitals filed for this visit. There is no height or weight on file to calculate BMI. Constitutional Well developed. Well nourished.  Vascular Dorsalis pedis pulses palpable bilaterally. Posterior tibial pulses palpable bilaterally. Capillary refill normal to all digits.  No cyanosis or clubbing noted. Pedal hair growth normal.  Neurologic Normal speech. Oriented to person, place, and time. Epicritic sensation to light touch grossly present bilaterally.  Dermatologic Right submetatarsal 5 porokeratosis with central nucleated core noted.  Pain on palpation to the lesion.  No pinpoint bleeding noted.  Orthopedic: Normal joint ROM without pain or crepitus bilaterally. No visible deformities. No bony tenderness.   Radiographs: None Assessment:   No diagnosis found.  Plan:  Patient was evaluated and treated and all questions answered.  Right submetatarsal 5 porokeratotic lesion -All questions and concerns were discussed with the patient extensive due to given the amount of pain that she is having she will benefit from debridement of the lesion using chisel blade handle the lesion was debrided down to healthy scar tissue no complication noted.  No pinpoint bleeding noted.  No follow-ups on file. "

## 2024-10-14 ENCOUNTER — Other Ambulatory Visit

## 2024-10-14 DIAGNOSIS — I5032 Chronic diastolic (congestive) heart failure: Secondary | ICD-10-CM

## 2024-10-14 DIAGNOSIS — E875 Hyperkalemia: Secondary | ICD-10-CM

## 2024-10-15 LAB — BASIC METABOLIC PANEL WITH GFR
BUN/Creatinine Ratio: 26 (ref 12–28)
BUN: 29 mg/dL — ABNORMAL HIGH (ref 8–27)
CO2: 23 mmol/L (ref 20–29)
Calcium: 8.8 mg/dL (ref 8.7–10.3)
Chloride: 105 mmol/L (ref 96–106)
Creatinine, Ser: 1.13 mg/dL — ABNORMAL HIGH (ref 0.57–1.00)
Glucose: 78 mg/dL (ref 70–99)
Potassium: 5.2 mmol/L (ref 3.5–5.2)
Sodium: 141 mmol/L (ref 134–144)
eGFR: 48 mL/min/{1.73_m2} — ABNORMAL LOW

## 2024-10-15 LAB — MAGNESIUM: Magnesium: 2.1 mg/dL (ref 1.6–2.3)

## 2024-10-17 ENCOUNTER — Ambulatory Visit: Payer: Self-pay | Admitting: Family Medicine

## 2024-10-17 ENCOUNTER — Ambulatory Visit: Admitting: Family Medicine

## 2024-11-01 ENCOUNTER — Ambulatory Visit (HOSPITAL_COMMUNITY)

## 2024-11-02 ENCOUNTER — Ambulatory Visit (HOSPITAL_COMMUNITY)

## 2024-11-24 ENCOUNTER — Ambulatory Visit: Admitting: Physician Assistant

## 2025-01-04 ENCOUNTER — Ambulatory Visit: Admitting: Family Medicine

## 2025-10-05 ENCOUNTER — Ambulatory Visit
